# Patient Record
Sex: Female | Born: 1970
Health system: Southern US, Community
[De-identification: ages and names within clinical notes are randomized; demographics above are authoritative.]

## PROBLEM LIST (undated history)

## (undated) DIAGNOSIS — IMO0002 Reserved for concepts with insufficient information to code with codable children: Secondary | ICD-10-CM

## (undated) DIAGNOSIS — G935 Compression of brain: Secondary | ICD-10-CM

## (undated) DIAGNOSIS — T82858A Stenosis of vascular prosthetic devices, implants and grafts, initial encounter: Secondary | ICD-10-CM

## (undated) DIAGNOSIS — R1310 Dysphagia, unspecified: Secondary | ICD-10-CM

## (undated) DIAGNOSIS — I10 Essential (primary) hypertension: Secondary | ICD-10-CM

## (undated) DIAGNOSIS — E669 Obesity, unspecified: Secondary | ICD-10-CM

## (undated) DIAGNOSIS — G932 Benign intracranial hypertension: Secondary | ICD-10-CM

## (undated) DIAGNOSIS — J45909 Unspecified asthma, uncomplicated: Secondary | ICD-10-CM

## (undated) DIAGNOSIS — E739 Lactose intolerance, unspecified: Secondary | ICD-10-CM

## (undated) DIAGNOSIS — N200 Calculus of kidney: Secondary | ICD-10-CM

## (undated) HISTORY — DX: Unspecified asthma, uncomplicated: J45.909

## (undated) HISTORY — DX: Benign intracranial hypertension: G93.2

## (undated) HISTORY — DX: Reserved for concepts with insufficient information to code with codable children: IMO0002

## (undated) HISTORY — DX: Calculus of kidney: N20.0

## (undated) HISTORY — DX: Essential (primary) hypertension: I10

## (undated) HISTORY — DX: Stenosis of other vascular prosthetic devices, implants and grafts, initial encounter: T82.858A

## (undated) HISTORY — DX: Lactose intolerance, unspecified: E73.9

## (undated) HISTORY — DX: Compression of brain: G93.5

## (undated) HISTORY — PX: APPENDECTOMY: SHX54

## (undated) HISTORY — PX: OTHER SURGICAL HISTORY: SHX169

## (undated) HISTORY — PX: GANGLION CYST EXCISION: SHX1691

## (undated) HISTORY — DX: Obesity, unspecified: E66.9

## (undated) HISTORY — DX: Dysphagia, unspecified: R13.10

---

## 1994-09-03 DIAGNOSIS — IMO0002 Reserved for concepts with insufficient information to code with codable children: Secondary | ICD-10-CM

## 1994-09-03 HISTORY — DX: Reserved for concepts with insufficient information to code with codable children: IMO0002

## 1996-07-04 DIAGNOSIS — T82858A Stenosis of vascular prosthetic devices, implants and grafts, initial encounter: Secondary | ICD-10-CM

## 1996-07-04 HISTORY — PX: SUBCLAVIAN BYPASS GRAFT: SHX1059

## 1996-07-04 HISTORY — DX: Stenosis of other vascular prosthetic devices, implants and grafts, initial encounter: T82.858A

## 1998-04-27 ENCOUNTER — Ambulatory Visit (HOSPITAL_COMMUNITY): Admission: RE | Admit: 1998-04-27 | Discharge: 1998-04-27 | Payer: Self-pay | Admitting: Internal Medicine

## 1999-04-10 ENCOUNTER — Ambulatory Visit: Admission: RE | Admit: 1999-04-10 | Discharge: 1999-04-10 | Payer: Self-pay | Admitting: Occupational Medicine

## 1999-04-10 ENCOUNTER — Encounter: Payer: Self-pay | Admitting: Occupational Medicine

## 1999-04-12 ENCOUNTER — Other Ambulatory Visit: Admission: RE | Admit: 1999-04-12 | Discharge: 1999-04-12 | Payer: Self-pay | Admitting: Obstetrics and Gynecology

## 2000-10-09 ENCOUNTER — Other Ambulatory Visit: Admission: RE | Admit: 2000-10-09 | Discharge: 2000-10-09 | Payer: Self-pay | Admitting: Obstetrics and Gynecology

## 2005-02-02 ENCOUNTER — Ambulatory Visit (HOSPITAL_COMMUNITY): Admission: RE | Admit: 2005-02-02 | Discharge: 2005-02-02 | Payer: Self-pay | Admitting: Family Medicine

## 2007-01-31 ENCOUNTER — Inpatient Hospital Stay (HOSPITAL_COMMUNITY): Admission: EM | Admit: 2007-01-31 | Discharge: 2007-02-02 | Payer: Self-pay | Admitting: Emergency Medicine

## 2007-02-02 HISTORY — PX: WRIST FRACTURE SURGERY: SHX121

## 2007-02-04 ENCOUNTER — Ambulatory Visit (HOSPITAL_COMMUNITY): Admission: RE | Admit: 2007-02-04 | Discharge: 2007-02-05 | Payer: Self-pay | Admitting: Orthopaedic Surgery

## 2007-03-12 ENCOUNTER — Encounter: Admission: RE | Admit: 2007-03-12 | Discharge: 2007-06-10 | Payer: Self-pay | Admitting: Orthopaedic Surgery

## 2007-03-25 ENCOUNTER — Ambulatory Visit (HOSPITAL_COMMUNITY): Admission: RE | Admit: 2007-03-25 | Discharge: 2007-03-25 | Payer: Self-pay | Admitting: Orthopaedic Surgery

## 2010-04-02 ENCOUNTER — Emergency Department (HOSPITAL_COMMUNITY): Admission: EM | Admit: 2010-04-02 | Discharge: 2010-04-02 | Payer: Self-pay | Admitting: Family Medicine

## 2010-10-03 ENCOUNTER — Other Ambulatory Visit: Payer: Self-pay | Admitting: Thoracic Surgery

## 2010-10-03 ENCOUNTER — Inpatient Hospital Stay: Admission: RE | Admit: 2010-10-03 | Payer: Self-pay | Source: Ambulatory Visit

## 2010-10-03 DIAGNOSIS — R52 Pain, unspecified: Secondary | ICD-10-CM

## 2010-10-04 ENCOUNTER — Other Ambulatory Visit: Payer: Self-pay | Admitting: Physical Medicine & Rehabilitation

## 2010-11-18 LAB — POCT URINALYSIS DIP (DEVICE)
Bilirubin Urine: NEGATIVE
Ketones, ur: NEGATIVE mg/dL
pH: 5.5 (ref 5.0–8.0)
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
## 2014-04-02 ENCOUNTER — Other Ambulatory Visit (HOSPITAL_COMMUNITY): Payer: Self-pay | Admitting: Interventional Radiology

## 2014-04-02 DIAGNOSIS — F17200 Nicotine dependence, unspecified, uncomplicated: Secondary | ICD-10-CM

## 2015-02-28 ENCOUNTER — Encounter: Payer: Self-pay | Admitting: *Deleted

## 2015-03-01 ENCOUNTER — Ambulatory Visit (INDEPENDENT_AMBULATORY_CARE_PROVIDER_SITE_OTHER): Payer: 59 | Admitting: Diagnostic Neuroimaging

## 2015-03-01 ENCOUNTER — Encounter: Payer: Self-pay | Admitting: Diagnostic Neuroimaging

## 2015-03-01 VITALS — BP 165/84 | HR 76 | Ht 66.0 in | Wt 261.8 lb

## 2015-03-01 DIAGNOSIS — H4710 Unspecified papilledema: Secondary | ICD-10-CM | POA: Diagnosis not present

## 2015-03-01 DIAGNOSIS — H47021 Hemorrhage in optic nerve sheath, right eye: Secondary | ICD-10-CM
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
## 2015-03-01 NOTE — Patient Instructions (Signed)
Filed Vitals:   03/01/15 0814 03/01/15 0916  BP: 161/90 165/84  Pulse: 84 76  Height: 5\' 6"  (1.676 m)   Weight: 261 lb 12.8 oz (118.752 kg)    - monitor BP and follow up with PCP - I will setup MRI scans - I will setup referral to retina specialist

## 2015-03-02 ENCOUNTER — Encounter: Payer: Self-pay | Admitting: Family

## 2015-03-02 ENCOUNTER — Ambulatory Visit (INDEPENDENT_AMBULATORY_CARE_PROVIDER_SITE_OTHER): Payer: 59 | Admitting: Family

## 2015-03-02 VITALS — BP 161/96 | HR 92 | Temp 98.3°F | Ht 66.0 in | Wt 258.2 lb

## 2015-03-02 DIAGNOSIS — I10 Essential (primary) hypertension: Secondary | ICD-10-CM

## 2015-03-02 MED ORDER — LISINOPRIL-HYDROCHLOROTHIAZIDE 20-12.5 MG PO TABS: 1.0000 | ORAL_TABLET | Freq: Every day | ORAL | Status: DC

## 2015-03-02 NOTE — Progress Notes (Signed)
   Subjective:    Patient ID: [name], female    DOB: 21-Mar-1971, 44 y.o.   MRN: [id number]  Pt presents to the office today to establish care. Pt states she saw her Opthmolgists on Monday and he told her she needed to follow up with her PCP about her BP. Pt states she has never had HTN before.  Hypertension This is a new problem. The problem is unchanged. The problem is uncontrolled. Associated symptoms include blurred vision. Pertinent negatives include no anxiety, headaches, palpitations, peripheral edema or shortness of breath. Risk factors for coronary artery disease include family history, obesity and sedentary lifestyle. Past treatments include nothing. The current treatment provides no improvement. There is no history of kidney disease, CAD/MI, CVA, heart failure or a thyroid problem. There is no history of sleep apnea.      Review of Systems  Constitutional: Negative.   HENT: Negative.   Eyes: Positive for blurred vision.  Respiratory: Negative.  Negative for shortness of breath.   Cardiovascular: Negative.  Negative for palpitations.  Gastrointestinal: Negative.   Endocrine: Negative.   Genitourinary: Negative.   Musculoskeletal: Negative.   Neurological: Negative.  Negative for headaches.  Hematological: Negative.   Psychiatric/Behavioral: Negative.   All other systems reviewed and are negative.      Objective:   Physical Exam  Constitutional: She is oriented to person, place, and time. She appears well-developed and well-nourished. No distress.  HENT:  Head: Normocephalic and atraumatic.  Right Ear: External ear normal.  Left Ear: External ear normal.  Nose: Nose normal.  Mouth/Throat: Oropharynx is clear and moist.  Eyes: Pupils are equal, round, and reactive to light.  Neck: Normal range of motion. Neck supple. No thyromegaly present.  Cardiovascular: Normal rate, regular rhythm, normal heart sounds and intact distal pulses.   No murmur  heard. Pulmonary/Chest: Effort normal and breath sounds normal. No respiratory distress. She has no wheezes.  Abdominal: Soft. Bowel sounds are normal. She exhibits no distension. There is no tenderness.  Musculoskeletal: Normal range of motion. She exhibits no edema or tenderness.  Neurological: She is alert and oriented to person, place, and time. She has normal reflexes. No cranial nerve deficit.  Skin: Skin is warm and dry.  Psychiatric: She has a normal mood and affect. Her behavior is normal. Judgment and thought content normal.  Vitals reviewed.   BP 161/96 mmHg  Pulse 92  Temp(Src) 98.3 F (36.8 C) (Oral)  Ht $R'5\' 6"'jT$  (1.676 m)  Wt 258 lb 3.2 oz (117.119 kg)  BMI 41.69 kg/m2       Assessment & Plan:  1. Essential hypertension -Pt started on Zestoretic 20-12.5 mg today -Daily blood pressure log given with instructions on how to fill out and told to bring to next visit -Dash diet information given -Exercise encouraged - Stress Management  -Continue current meds -RTO in 2 weeks - CMP14+EGFR - lisinopril-hydrochlorothiazide (ZESTORETIC) 20-12.5 MG per tablet; Take 1 tablet by mouth daily.  Dispense: 90 tablet; Refill: 3   Continue all meds Labs pending Health Maintenance reviewed Diet and exercise encouraged RTO 2 weeks  [name], FNP

## 2015-03-02 NOTE — Patient Instructions (Addendum)
Health Maintenance Adopting a healthy lifestyle and getting preventive care can go a long way to promote health and wellness. Talk with your health care provider about what schedule of regular examinations is right for you. This is a good chance for you to check in with your provider about disease prevention and staying healthy. In between checkups, there are plenty of things you can do on your own. Experts have done a lot of research about which lifestyle changes and preventive measures are most likely to keep you healthy. Ask your health care provider for more information. WEIGHT AND DIET  Eat a healthy diet  Be sure to include plenty of vegetables, fruits, low-fat dairy products, and lean protein.  Do not eat a lot of foods high in solid fats, added sugars, or salt.  Get regular exercise. This is one of the most important things you can do for your health.  Most adults should exercise for at least 150 minutes each week. The exercise should increase your heart rate and make you sweat (moderate-intensity exercise).  Most adults should also do strengthening exercises at least twice a week. This is in addition to the moderate-intensity exercise.  Maintain a healthy weight  Body mass index (BMI) is a measurement that can be used to identify possible weight problems. It estimates body fat based on height and weight. Your health care provider can help determine your BMI and help you achieve or maintain a healthy weight.  For females 95 years of age and older:   A BMI below 18.5 is considered underweight.  A BMI of 18.5 to 24.9 is normal.  A BMI of 25 to 29.9 is considered overweight.  A BMI of 30 and above is considered obese.  Watch levels of cholesterol and blood lipids  You should start having your blood tested for lipids and cholesterol at 42 years of age, then have this test every 5 years.  You may need to have your cholesterol levels checked more often if:  Your lipid or  cholesterol levels are high.  You are older than 42 years of age.  You are at high risk for heart disease.  CANCER SCREENING   Lung Cancer  Lung cancer screening is recommended for adults 43-18 years old who are at high risk for lung cancer because of a history of smoking.  A yearly low-dose CT scan of the lungs is recommended for people who:  Currently smoke.  Have quit within the past 15 years.  Have at least a 30-pack-year history of smoking. A pack year is smoking an average of one pack of cigarettes a day for 1 year.  Yearly screening should continue until it has been 15 years since you quit.  Yearly screening should stop if you develop a health problem that would prevent you from having lung cancer treatment.  Breast Cancer  Practice breast self-awareness. This means understanding how your breasts normally appear and feel.  It also means doing regular breast self-exams. Let your health care provider know about any changes, no matter how small.  If you are in your 20s or 30s, you should have a clinical breast exam (CBE) by a health care provider every 1-3 years as part of a regular health exam.  If you are 54 or older, have a CBE every year. Also consider having a breast X-ray (mammogram) every year.  If you have a family history of breast cancer, talk to your health care provider about genetic screening.  If you are  at high risk for breast cancer, talk to your health care provider about having an MRI and a mammogram every year.  Breast cancer gene (BRCA) assessment is recommended for women who have family members with BRCA-related cancers. BRCA-related cancers include:  Breast.  Ovarian.  Tubal.  Peritoneal cancers.  Results of the assessment will determine the need for genetic counseling and BRCA1 and BRCA2 testing. Cervical Cancer Routine pelvic examinations to screen for cervical cancer are no longer recommended for nonpregnant women who are considered low  risk for cancer of the pelvic organs (ovaries, uterus, and vagina) and who do not have symptoms. A pelvic examination may be necessary if you have symptoms including those associated with pelvic infections. Ask your health care provider if a screening pelvic exam is right for you.   The Pap test is the screening test for cervical cancer for women who are considered at risk.  If you had a hysterectomy for a problem that was not cancer or a condition that could lead to cancer, then you no longer need Pap tests.  If you are older than 65 years, and you have had normal Pap tests for the past 10 years, you no longer need to have Pap tests.  If you have had past treatment for cervical cancer or a condition that could lead to cancer, you need Pap tests and screening for cancer for at least 20 years after your treatment.  If you no longer get a Pap test, assess your risk factors if they change (such as having a new sexual partner). This can affect whether you should start being screened again.  Some women have medical problems that increase their chance of getting cervical cancer. If this is the case for you, your health care provider may recommend more frequent screening and Pap tests.  The human papillomavirus (HPV) test is another test that may be used for cervical cancer screening. The HPV test looks for the virus that can cause cell changes in the cervix. The cells collected during the Pap test can be tested for HPV.  The HPV test can be used to screen women 30 years of age and older. Getting tested for HPV can extend the interval between normal Pap tests from three to five years.  An HPV test also should be used to screen women of any age who have unclear Pap test results.  After 42 years of age, women should have HPV testing as often as Pap tests.  Colorectal Cancer  This type of cancer can be detected and often prevented.  Routine colorectal cancer screening usually begins at 42 years of  age and continues through 42 years of age.  Your health care provider may recommend screening at an earlier age if you have risk factors for colon cancer.  Your health care provider may also recommend using home test kits to check for hidden blood in the stool.  A small camera at the end of a tube can be used to examine your colon directly (sigmoidoscopy or colonoscopy). This is done to check for the earliest forms of colorectal cancer.  Routine screening usually begins at age 50.  Direct examination of the colon should be repeated every 5-10 years through 42 years of age. However, you may need to be screened more often if early forms of precancerous polyps or small growths are found. Skin Cancer  Check your skin from head to toe regularly.  Tell your health care provider about any new moles or changes in   moles, especially if there is a change in a mole's shape or color.  Also tell your health care provider if you have a mole that is larger than the size of a pencil eraser.  Always use sunscreen. Apply sunscreen liberally and repeatedly throughout the day.  Protect yourself by wearing long sleeves, pants, a wide-brimmed hat, and sunglasses whenever you are outside. HEART DISEASE, DIABETES, AND HIGH BLOOD PRESSURE   Have your blood pressure checked at least every 1-2 years. High blood pressure causes heart disease and increases the risk of stroke.  If you are between 42 years and 69 years old, ask your health care provider if you should take aspirin to prevent strokes.  Have regular diabetes screenings. This involves taking a blood sample to check your fasting blood sugar level.  If you are at a normal weight and have a low risk for diabetes, have this test once every three years after 42 years of age.  If you are overweight and have a high risk for diabetes, consider being tested at a younger age or more often. PREVENTING INFECTION  Hepatitis B  If you have a higher risk for  hepatitis B, you should be screened for this virus. You are considered at high risk for hepatitis B if:  You were born in a country where hepatitis B is common. Ask your health care provider which countries are considered high risk.  Your parents were born in a high-risk country, and you have not been immunized against hepatitis B (hepatitis B vaccine).  You have HIV or AIDS.  You use needles to inject street drugs.  You live with someone who has hepatitis B.  You have had sex with someone who has hepatitis B.  You get hemodialysis treatment.  You take certain medicines for conditions, including cancer, organ transplantation, and autoimmune conditions. Hepatitis C  Blood testing is recommended for:  Everyone born from [birth year] through 1963.  Anyone with known risk factors for hepatitis C. Sexually transmitted infections (STIs)  You should be screened for sexually transmitted infections (STIs) including gonorrhea and chlamydia if:  You are sexually active and are younger than 42 years of age.  You are older than 43 years of age and your health care provider tells you that you are at risk for this type of infection.  Your sexual activity has changed since you were last screened and you are at an increased risk for chlamydia or gonorrhea. Ask your health care provider if you are at risk.  If you do not have HIV, but are at risk, it may be recommended that you take a prescription medicine daily to prevent HIV infection. This is called pre-exposure prophylaxis (PrEP). You are considered at risk if:  You are sexually active and do not regularly use condoms or know the HIV status of your partner(s).  You take drugs by injection.  You are sexually active with a partner who has HIV. Talk with your health care provider about whether you are at high risk of being infected with HIV. If you choose to begin PrEP, you should first be tested for HIV. You should then be tested every 3 months for  as long as you are taking PrEP.  PREGNANCY   If you are premenopausal and you may become pregnant, ask your health care provider about preconception counseling.  If you may become pregnant, take 400 to 800 micrograms (mcg) of folic acid every day.  If you want to prevent pregnancy, talk to your  health care provider about birth control (contraception). OSTEOPOROSIS AND MENOPAUSE   Osteoporosis is a disease in which the bones lose minerals and strength with aging. This can result in serious bone fractures. Your risk for osteoporosis can be identified using a bone density scan.  If you are 38 years of age or older, or if you are at risk for osteoporosis and fractures, ask your health care provider if you should be screened.  Ask your health care provider whether you should take a calcium or vitamin D supplement to lower your risk for osteoporosis.  Menopause may have certain physical symptoms and risks.  Hormone replacement therapy may reduce some of these symptoms and risks. Talk to your health care provider about whether hormone replacement therapy is right for you.  HOME CARE INSTRUCTIONS   Schedule regular health, dental, and eye exams.  Stay current with your immunizations.   Do not use any tobacco products including cigarettes, chewing tobacco, or electronic cigarettes.  If you are pregnant, do not drink alcohol.  If you are breastfeeding, limit how much and how often you drink alcohol.  Limit alcohol intake to no more than 1 drink per day for nonpregnant women. One drink equals 12 ounces of beer, 5 ounces of wine, or 1 ounces of hard liquor.  Do not use street drugs.  Do not share needles.  Ask your health care provider for help if you need support or information about quitting drugs.  Tell your health care provider if you often feel depressed.  Tell your health care provider if you have ever been abused or do not feel safe at home. Document Released: 03/05/2011  Document Revised: 01/04/2014 Document Reviewed: 07/22/2013 [facility] Patient Information 2015 [place], [place]. This information is not intended to replace advice given to you by your health care provider. Make sure you discuss any questions you have with your health care provider. DASH Eating Plan DASH stands for "Dietary Approaches to Stop Hypertension." The DASH eating plan is a healthy eating plan that has been shown to reduce high blood pressure (hypertension). Additional health benefits may include reducing the risk of type 2 diabetes mellitus, heart disease, and stroke. The DASH eating plan may also help with weight loss. WHAT DO I NEED TO KNOW ABOUT THE DASH EATING PLAN? For the DASH eating plan, you will follow these general guidelines:  Choose foods with a percent daily value for sodium of less than 5% (as listed on the food label).  Use salt-free seasonings or herbs instead of table salt or sea salt.  Check with your health care provider or pharmacist before using salt substitutes.  Eat lower-sodium products, often labeled as "lower sodium" or "no salt added."  Eat fresh foods.  Eat more vegetables, fruits, and low-fat dairy products.  Choose whole grains. Look for the word "whole" as the first word in the ingredient list.  Choose fish and skinless chicken or [place] more often than red meat. Limit fish, poultry, and meat to 6 oz (170 g) each day.  Limit sweets, desserts, sugars, and sugary drinks.  Choose heart-healthy fats.  Limit cheese to 1 oz (28 g) per day.  Eat more home-cooked food and less restaurant, buffet, and fast food.  Limit fried foods.  Cook foods using methods other than frying.  Limit canned vegetables. If you do use them, rinse them well to decrease the sodium.  When eating at a restaurant, ask that your food be prepared with less salt, or no salt if possible.  WHAT FOODS CAN I EAT? Seek help from a dietitian for individual calorie  needs. Grains Whole grain or whole wheat bread. Brown rice. Whole grain or whole wheat pasta. Quinoa, bulgur, and whole grain cereals. Low-sodium cereals. Corn or whole wheat flour tortillas. Whole grain cornbread. Whole grain crackers. Low-sodium crackers. Vegetables Fresh or frozen vegetables (raw, steamed, roasted, or grilled). Low-sodium or reduced-sodium tomato and vegetable juices. Low-sodium or reduced-sodium tomato sauce and paste. Low-sodium or reduced-sodium canned vegetables.  Fruits All fresh, canned (in natural juice), or frozen fruits. Meat and Other Protein Products Ground beef (85% or leaner), grass-fed beef, or beef trimmed of fat. Skinless chicken or [place]. Ground chicken or [place]. Pork trimmed of fat. All fish and seafood. Eggs. Dried beans, peas, or lentils. Unsalted nuts and seeds. Unsalted canned beans. Dairy Low-fat dairy products, such as skim or 1% milk, 2% or reduced-fat cheeses, low-fat ricotta or cottage cheese, or plain low-fat yogurt. Low-sodium or reduced-sodium cheeses. Fats and Oils Tub margarines without trans fats. Light or reduced-fat mayonnaise and salad dressings (reduced sodium). Avocado. Safflower, olive, or canola oils. Natural peanut or almond butter. Other Unsalted popcorn and pretzels. The items listed above may not be a complete list of recommended foods or beverages. Contact your dietitian for more options. WHAT FOODS ARE NOT RECOMMENDED? Grains Dokes bread. Bodenheimer pasta. Neyer rice. Refined cornbread. Bagels and croissants. Crackers that contain trans fat. Vegetables Creamed or fried vegetables. Vegetables in a cheese sauce. Regular canned vegetables. Regular canned tomato sauce and paste. Regular tomato and vegetable juices. Fruits Dried fruits. Canned fruit in light or heavy syrup. Fruit juice. Meat and Other Protein Products Fatty cuts of meat. Ribs, chicken wings, bacon, sausage, bologna, salami, chitterlings, fatback, hot dogs, bratwurst,  and packaged luncheon meats. Salted nuts and seeds. Canned beans with salt. Dairy Whole or 2% milk, cream, half-and-half, and cream cheese. Whole-fat or sweetened yogurt. Full-fat cheeses or blue cheese. Nondairy creamers and whipped toppings. Processed cheese, cheese spreads, or cheese curds. Condiments Onion and garlic salt, seasoned salt, table salt, and sea salt. Canned and packaged gravies. Worcestershire sauce. Tartar sauce. Barbecue sauce. Teriyaki sauce. Soy sauce, including reduced sodium. Steak sauce. Fish sauce. Oyster sauce. Cocktail sauce. Horseradish. Ketchup and mustard. Meat flavorings and tenderizers. Bouillon cubes. Hot sauce. Tabasco sauce. Marinades. Taco seasonings. Relishes. Fats and Oils Butter, stick margarine, lard, shortening, ghee, and bacon fat. Coconut, palm kernel, or palm oils. Regular salad dressings. Other Pickles and olives. Salted popcorn and pretzels. The items listed above may not be a complete list of foods and beverages to avoid. Contact your dietitian for more information. WHERE CAN I FIND MORE INFORMATION? National Heart, Lung, and Blood Institute: travelstabloid.com Document Released: 08/09/2011 Document Revised: 01/04/2014 Document Reviewed: 06/24/2013 [facility] Patient Information 2015 [place], [place]. This information is not intended to replace advice given to you by your health care provider. Make sure you discuss any questions you have with your health care provider. Hypertension Hypertension, commonly called high blood pressure, is when the force of blood pumping through your arteries is too strong. Your arteries are the blood vessels that carry blood from your heart throughout your body. A blood pressure reading consists of a higher number over a lower number, such as 110/72. The higher number (systolic) is the pressure inside your arteries when your heart pumps. The lower number (diastolic) is the pressure inside your  arteries when your heart relaxes. Ideally you want your blood pressure below 120/80. Hypertension forces your heart to work harder  to pump blood. Your arteries may become narrow or stiff. Having hypertension puts you at risk for heart disease, stroke, and other problems.  RISK FACTORS Some risk factors for high blood pressure are controllable. Others are not.  Risk factors you cannot control include:   Race. You may be at higher risk if you are African American.  Age. Risk increases with age.  Gender. Men are at higher risk than women before age 71 years. After age 23, women are at higher risk than men. Risk factors you can control include:  Not getting enough exercise or physical activity.  Being overweight.  Getting too much fat, sugar, calories, or salt in your diet.  Drinking too much alcohol. SIGNS AND SYMPTOMS Hypertension does not usually cause signs or symptoms. Extremely high blood pressure (hypertensive crisis) may cause headache, anxiety, shortness of breath, and nosebleed. DIAGNOSIS  To check if you have hypertension, your health care provider will measure your blood pressure while you are seated, with your arm held at the level of your heart. It should be measured at least twice using the same arm. Certain conditions can cause a difference in blood pressure between your right and left arms. A blood pressure reading that is higher than normal on one occasion does not mean that you need treatment. If one blood pressure reading is high, ask your health care provider about having it checked again. TREATMENT  Treating high blood pressure includes making lifestyle changes and possibly taking medicine. Living a healthy lifestyle can help lower high blood pressure. You may need to change some of your habits. Lifestyle changes may include:  Following the DASH diet. This diet is high in fruits, vegetables, and whole grains. It is low in salt, red meat, and added sugars.  Getting at  least 2 hours of brisk physical activity every week.  Losing weight if necessary.  Not smoking.  Limiting alcoholic beverages.  Learning ways to reduce stress. If lifestyle changes are not enough to get your blood pressure under control, your health care provider may prescribe medicine. You may need to take more than one. Work closely with your health care provider to understand the risks and benefits. HOME CARE INSTRUCTIONS  Have your blood pressure rechecked as directed by your health care provider.   Take medicines only as directed by your health care provider. Follow the directions carefully. Blood pressure medicines must be taken as prescribed. The medicine does not work as well when you skip doses. Skipping doses also puts you at risk for problems.   Do not smoke.   Monitor your blood pressure at home as directed by your health care provider. SEEK MEDICAL CARE IF:   You think you are having a reaction to medicines taken.  You have recurrent headaches or feel dizzy.  You have swelling in your ankles.  You have trouble with your vision. SEEK IMMEDIATE MEDICAL CARE IF:  You develop a severe headache or confusion.  You have unusual weakness, numbness, or feel faint.  You have severe chest or abdominal pain.  You vomit repeatedly.  You have trouble breathing. MAKE SURE YOU:   Understand these instructions.  Will watch your condition.  Will get help right away if you are not doing well or get worse. Document Released: 08/20/2005 Document Revised: 01/04/2014 Document Reviewed: 06/12/2013 [facility] Patient Information 2015 [place], [place]. This information is not intended to replace advice given to you by your health care provider. Make sure you discuss any questions you have with  your health care provider.

## 2015-03-03 LAB — CMP14+EGFR
ALBUMIN: 4.5 g/dL (ref 3.5–5.5)
ALT: 32 IU/L (ref 0–32)
AST: 18 IU/L (ref 0–40)
Albumin/Globulin Ratio: 1.8 (ref 1.1–2.5)
Alkaline Phosphatase: 45 IU/L (ref 39–117)
BUN/Creatinine Ratio: 25 — ABNORMAL HIGH (ref 9–23)
BUN: 21 mg/dL (ref 6–24)
Bilirubin Total: 0.5 mg/dL (ref 0.0–1.2)
CALCIUM: 9.6 mg/dL (ref 8.7–10.2)
CHLORIDE: 100 mmol/L (ref 97–108)
CO2: 21 mmol/L (ref 18–29)
CREATININE: 0.83 mg/dL (ref 0.57–1.00)
GFR calc Af Amer: 101 mL/min/{1.73_m2} (ref >59)
GFR calc non Af Amer: 88 mL/min/{1.73_m2} (ref >59)
GLUCOSE: 85 mg/dL (ref 65–99)
Globulin, Total: 2.5 g/dL (ref 1.5–4.5)
Potassium: 4.3 mmol/L (ref 3.5–5.2)
SODIUM: 139 mmol/L (ref 134–144)
Total Protein: 7 g/dL (ref 6.0–8.5)

## 2015-03-15 ENCOUNTER — Encounter: Payer: Self-pay | Admitting: Physician Assistant

## 2015-03-15 ENCOUNTER — Ambulatory Visit (INDEPENDENT_AMBULATORY_CARE_PROVIDER_SITE_OTHER): Payer: 59 | Admitting: Physician Assistant

## 2015-03-15 VITALS — BP 139/85 | HR 75 | Temp 97.7°F | Ht 66.0 in | Wt 258.0 lb

## 2015-03-15 DIAGNOSIS — Z0000 Encounter for general adult medical examination without abnormal findings: Secondary | ICD-10-CM | POA: Diagnosis not present

## 2015-03-15 DIAGNOSIS — I10 Essential (primary) hypertension: Secondary | ICD-10-CM

## 2015-03-15 NOTE — Progress Notes (Signed)
   Subjective:    Patient ID: [name], female    DOB: [birth year], 44 y.o.   MRN: [id number]  HPI 44 y/o female presents for 2 week follow up of newly diagnosed HTN. She was started on Lisinopril $RemoveBefor'20mg'NedqDibizfZR$  , which appears to be working well according to patients recordings at home and today's vitals.   She thought that she was having eye problems d/t her bp, however, she went to the opthalmologist and was told to have optic nerve enlargement. She is scheduled to have an MRI tomorrow.     Review of Systems  Constitutional: Negative.   HENT: Negative.   Eyes: Positive for visual disturbance.  Respiratory: Negative.   Cardiovascular: Negative.   Gastrointestinal: Negative.   Endocrine: Positive for polyuria.  Genitourinary: Negative.   Musculoskeletal: Negative.   Skin: Negative.   Allergic/Immunologic: Negative.   Neurological: Negative.   Hematological: Negative.   Psychiatric/Behavioral: Negative.        Objective:   Physical Exam  Constitutional: She is oriented to person, place, and time. She appears well-developed and well-nourished. No distress.  HENT:  Head: Normocephalic.  Cardiovascular: Normal rate, regular rhythm, normal heart sounds and intact distal pulses.  Exam reveals no gallop and no friction rub.   No murmur heard. Pulmonary/Chest: Effort normal and breath sounds normal. No respiratory distress. She has no wheezes. She has no rales. She exhibits no tenderness.  Musculoskeletal: She exhibits no edema.  Neurological: She is alert and oriented to person, place, and time.  Skin: She is not diaphoretic.  Psychiatric: She has a normal mood and affect. Her behavior is normal. Judgment and thought content normal.  Nursing note and vitals reviewed.         Assessment & Plan:  1. Benign essential HTN  - Continue Lisinopril as directed. Follow up in 3 months  2. Essential hypertension  - CMP14+EGFR - Thyroid Panel With TSH - Lipid panel  3.  Preventative health care  - Thyroid Panel With TSH   Also discussed screening mammogram with patient. She has not had one since turning age 75. She agrees to have one but would like to wait until she has her other testing with her optic eval and MRI  Continue all meds Labs pending Health Maintenance reviewed Diet and exercise encouraged RTO 3 months for recheck of HTN  Keondria Siever A. Benjamin Stain PA-C

## 2015-03-16 ENCOUNTER — Other Ambulatory Visit: Payer: 59

## 2015-03-16 ENCOUNTER — Ambulatory Visit (INDEPENDENT_AMBULATORY_CARE_PROVIDER_SITE_OTHER): Payer: 59

## 2015-03-16 DIAGNOSIS — H47021 Hemorrhage in optic nerve sheath, right eye: Secondary | ICD-10-CM | POA: Diagnosis not present

## 2015-03-16 DIAGNOSIS — H4710 Unspecified papilledema: Secondary | ICD-10-CM

## 2015-03-16 LAB — LIPID PANEL
CHOLESTEROL TOTAL: 188 mg/dL (ref 100–199)
Chol/HDL Ratio: 3.3 ratio units (ref 0.0–4.4)
HDL: 57 mg/dL (ref >39)
LDL Calculated: 109 mg/dL — ABNORMAL HIGH (ref 0–99)
Triglycerides: 111 mg/dL (ref 0–149)
VLDL CHOLESTEROL CAL: 22 mg/dL (ref 5–40)

## 2015-03-16 LAB — THYROID PANEL WITH TSH
Free Thyroxine Index: 2 (ref 1.2–4.9)
T3 Uptake Ratio: 26 % (ref 24–39)
T4 TOTAL: 7.8 ug/dL (ref 4.5–12.0)
TSH: 1.88 u[IU]/mL (ref 0.450–4.500)

## 2015-03-16 LAB — CMP14+EGFR
A/G RATIO: 1.8 (ref 1.1–2.5)
ALT: 24 IU/L (ref 0–32)
AST: 17 IU/L (ref 0–40)
Albumin: 4.4 g/dL (ref 3.5–5.5)
Alkaline Phosphatase: 43 IU/L (ref 39–117)
BUN / CREAT RATIO: 18 (ref 9–23)
BUN: 15 mg/dL (ref 6–24)
Bilirubin Total: 0.4 mg/dL (ref 0.0–1.2)
CALCIUM: 9.3 mg/dL (ref 8.7–10.2)
CHLORIDE: 98 mmol/L (ref 97–108)
CO2: 20 mmol/L (ref 18–29)
CREATININE: 0.82 mg/dL (ref 0.57–1.00)
GFR calc Af Amer: 103 mL/min/{1.73_m2} (ref >59)
GFR, EST NON AFRICAN AMERICAN: 89 mL/min/{1.73_m2} (ref >59)
Globulin, Total: 2.4 g/dL (ref 1.5–4.5)
Glucose: 84 mg/dL (ref 65–99)
POTASSIUM: 4.4 mmol/L (ref 3.5–5.2)
Sodium: 136 mmol/L (ref 134–144)
Total Protein: 6.8 g/dL (ref 6.0–8.5)

## 2015-03-17 MED ORDER — GADOPENTETATE DIMEGLUMINE 469.01 MG/ML IV SOLN: 20.0000 mL | Freq: Once | INTRAVENOUS | Status: AC | PRN

## 2015-03-24 ENCOUNTER — Encounter: Payer: Self-pay | Admitting: *Deleted

## 2015-04-12 ENCOUNTER — Other Ambulatory Visit: Payer: Self-pay | Admitting: *Deleted

## 2015-04-14 ENCOUNTER — Other Ambulatory Visit: Payer: Self-pay

## 2015-04-19 ENCOUNTER — Other Ambulatory Visit: Payer: Self-pay

## 2015-04-19 DIAGNOSIS — I10 Essential (primary) hypertension: Secondary | ICD-10-CM

## 2015-04-19 MED ORDER — LISINOPRIL-HYDROCHLOROTHIAZIDE 20-12.5 MG PO TABS: 1.0000 | ORAL_TABLET | Freq: Every day | ORAL | Status: DC

## 2015-04-20 ENCOUNTER — Ambulatory Visit (INDEPENDENT_AMBULATORY_CARE_PROVIDER_SITE_OTHER): Payer: 59 | Admitting: Diagnostic Neuroimaging

## 2015-04-20 ENCOUNTER — Encounter: Payer: Self-pay | Admitting: Diagnostic Neuroimaging

## 2015-04-20 ENCOUNTER — Telehealth: Payer: Self-pay | Admitting: *Deleted

## 2015-04-20 VITALS — BP 135/82 | HR 71 | Wt 253.0 lb

## 2015-04-20 DIAGNOSIS — H47021 Hemorrhage in optic nerve sheath, right eye: Secondary | ICD-10-CM | POA: Diagnosis not present

## 2015-04-20 DIAGNOSIS — H4710 Unspecified papilledema: Secondary | ICD-10-CM

## 2015-04-20 NOTE — Patient Instructions (Signed)
-   follow up referral to retinal specialist - setup lumbar puncture to measure opening pressure - then may consider acetazolamide or topiramate
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
## 2015-04-20 NOTE — Telephone Encounter (Signed)
Form,[employer] sent to [facility] and Dr Leta Baptist 04/20/15.

## 2015-04-21 ENCOUNTER — Telehealth: Payer: Self-pay | Admitting: *Deleted

## 2015-04-21 DIAGNOSIS — Z0289 Encounter for other administrative examinations: Secondary | ICD-10-CM

## 2015-04-21 NOTE — Telephone Encounter (Signed)
Form completed and sent to MR.

## 2015-04-21 NOTE — Telephone Encounter (Signed)
Spoke with Baker Janus at [place] and advised her of Dr Gladstone Lighter order for fluoro guided LP for this patient. She stated she would "take care of it" and call patient.

## 2015-04-22 ENCOUNTER — Telehealth: Payer: Self-pay | Admitting: *Deleted

## 2015-04-22 NOTE — Telephone Encounter (Signed)
Form,[employer] received,completed by Dr Leta Baptist and Stanton Kidney at front desk for patient 04/22/15.

## 2015-04-28 ENCOUNTER — Telehealth: Payer: Self-pay | Admitting: *Deleted

## 2015-04-28 NOTE — Telephone Encounter (Signed)
Form,[employer] sent to [facility] and Dr Leta Baptist 04/28/15.

## 2015-04-29 ENCOUNTER — Other Ambulatory Visit: Payer: Self-pay | Admitting: Diagnostic Neuroimaging

## 2015-04-29 ENCOUNTER — Ambulatory Visit
Admission: RE | Admit: 2015-04-29 | Discharge: 2015-04-29 | Disposition: A | Discharge status: Home / Self Care | Payer: 59 | Source: Ambulatory Visit | Attending: Diagnostic Neuroimaging | Admitting: Diagnostic Neuroimaging

## 2015-04-29 DIAGNOSIS — H4710 Unspecified papilledema: Secondary | ICD-10-CM

## 2015-04-29 DIAGNOSIS — H47021 Hemorrhage in optic nerve sheath, right eye: Secondary | ICD-10-CM

## 2015-04-29 LAB — CSF CELL COUNT WITH DIFFERENTIAL
RBC COUNT CSF: 0 uL
TUBE #: 3
WBC CSF: 0 uL (ref 0–5)

## 2015-04-29 LAB — PROTEIN, CSF: Total Protein, CSF: 28 mg/dL (ref 15–45)

## 2015-04-29 LAB — GLUCOSE, CSF: Glucose, CSF: 54 mg/dL (ref 43–76)

## 2015-04-29 NOTE — Discharge Instructions (Signed)

## 2015-05-01 ENCOUNTER — Telehealth: Payer: Self-pay | Admitting: Neurology

## 2015-05-01 NOTE — Telephone Encounter (Signed)
The patient called yesterday. She is having some symptoms of nausea, dizziness and slight headache with sitting and standing following an LP this week. I have asked her to keep her head down for the next 2 days, call if the symptoms persist, and we will get a blood patch set up.

## 2015-05-02 ENCOUNTER — Telehealth: Payer: Self-pay | Admitting: Diagnostic Neuroimaging

## 2015-05-02 LAB — CSF CULTURE
GRAM STAIN: NONE SEEN
GRAM STAIN: NONE SEEN

## 2015-05-02 LAB — CSF CULTURE W GRAM STAIN: Organism ID, Bacteria: NO GROWTH

## 2015-05-02 NOTE — Telephone Encounter (Signed)
Patient is calling and states that she is still having headaches and nausea as a result of her lumbar puncture.  Please call.

## 2015-05-02 NOTE — Telephone Encounter (Signed)
Spoke with patient who states if she is lying down or reclining she "is fine", but when she gets up she states HA and nausea return. She states she has taken Tylenol with little relief, states "I can tolerate HA but not the nausea".  She has not taken any other medications for her issues. Her LP was 04/29/15. Informed her would route her c/o to Dr Leta Baptist and call her back with his reply. She verbalized understanding, appreciation.

## 2015-05-02 NOTE — Telephone Encounter (Signed)
Spoke with patient and informed her of Dr [employer] recommendations.  She agrees to try his recommendations. She is requesting Zofran and Fioricet be e scribed to [facility] today. Informed her would send request to Dr Leta Baptist, and advised she call pharmacy before going to pick up.  She verbalized understanding, appreciation.  Also obtained information to complete her LOA forms. She states she is hoping medications will ease her nausea so she can return to work on 05/04/15 without restrictions. Papers on Dr [employer] desk for signature.

## 2015-05-02 NOTE — Telephone Encounter (Signed)
Encourage caffeine, water, rest. Can call in fioricet or phenergan or zofran. If sxs continue more than 7 days, then will consider blood patch. -VRP

## 2015-05-03 ENCOUNTER — Telehealth: Payer: Self-pay | Admitting: *Deleted

## 2015-05-03 MED ORDER — ONDANSETRON 4 MG PO TBDP: 4.0000 mg | ORAL_TABLET | Freq: Three times a day (TID) | ORAL | Status: DC | PRN

## 2015-05-03 MED ORDER — BUTALBITAL-APAP-CAFFEINE 50-325-40 MG PO TABS
1.0000 | ORAL_TABLET | Freq: Three times a day (TID) | ORAL | Status: DC | PRN
Start: 2015-05-03 — End: 2015-06-23

## 2015-05-03 NOTE — Telephone Encounter (Signed)
Will send in fioricet and ondansetron. -VRP

## 2015-05-03 NOTE — Telephone Encounter (Signed)
Form,[employer] received, completed by Dr Leta Baptist and Stanton Kidney she faxed form 05/03/15.

## 2015-05-03 NOTE — Telephone Encounter (Signed)
FMLA/ leave papers complete and successfully faxed to [facility] at patient's request. Sent to MR to be scanned.

## 2015-05-04 ENCOUNTER — Encounter: Payer: Self-pay | Admitting: *Deleted

## 2015-05-04 ENCOUNTER — Telehealth: Payer: Self-pay | Admitting: Diagnostic Neuroimaging

## 2015-05-04 ENCOUNTER — Emergency Department (HOSPITAL_COMMUNITY): Payer: 59

## 2015-05-04 ENCOUNTER — Emergency Department (HOSPITAL_COMMUNITY)
Admission: EM | Admit: 2015-05-04 | Discharge: 2015-05-04 | Disposition: A | Discharge status: Home / Self Care | Payer: 59 | Attending: Emergency Medicine | Admitting: Emergency Medicine

## 2015-05-04 ENCOUNTER — Encounter (HOSPITAL_COMMUNITY): Payer: Self-pay | Admitting: Emergency Medicine

## 2015-05-04 DIAGNOSIS — J9801 Acute bronchospasm: Secondary | ICD-10-CM | POA: Diagnosis not present

## 2015-05-04 DIAGNOSIS — Z9889 Other specified postprocedural states: Secondary | ICD-10-CM | POA: Insufficient documentation

## 2015-05-04 DIAGNOSIS — Z79899 Other long term (current) drug therapy: Secondary | ICD-10-CM | POA: Diagnosis not present

## 2015-05-04 DIAGNOSIS — G971 Other reaction to spinal and lumbar puncture: Secondary | ICD-10-CM | POA: Insufficient documentation

## 2015-05-04 DIAGNOSIS — Q0700 Arnold-Chiari syndrome without spina bifida or hydrocephalus: Secondary | ICD-10-CM | POA: Diagnosis not present

## 2015-05-04 DIAGNOSIS — R0602 Shortness of breath: Secondary | ICD-10-CM | POA: Diagnosis present

## 2015-05-04 LAB — BASIC METABOLIC PANEL
ANION GAP: 10 (ref 5–15)
BUN: 15 mg/dL (ref 6–20)
CALCIUM: 9.3 mg/dL (ref 8.9–10.3)
CHLORIDE: 103 mmol/L (ref 101–111)
CO2: 22 mmol/L (ref 22–32)
Creatinine, Ser: 1 mg/dL (ref 0.44–1.00)
GFR calc Af Amer: >60 mL/min (ref >60)
GFR calc non Af Amer: >60 mL/min (ref >60)
GLUCOSE: 106 mg/dL — AB (ref 65–99)
Potassium: 3.2 mmol/L — ABNORMAL LOW (ref 3.5–5.1)
Sodium: 135 mmol/L (ref 135–145)

## 2015-05-04 LAB — CBC WITH DIFFERENTIAL/PLATELET
BASOS ABS: 0 10*3/uL (ref 0.0–0.1)
Basophils Relative: 1 % (ref 0–1)
Eosinophils Absolute: 0.5 10*3/uL (ref 0.0–0.7)
Eosinophils Relative: 6 % — ABNORMAL HIGH (ref 0–5)
HEMATOCRIT: 39.7 % (ref 36.0–46.0)
HEMOGLOBIN: 13.6 g/dL (ref 12.0–15.0)
LYMPHS PCT: 23 % (ref 12–46)
Lymphs Abs: 1.9 10*3/uL (ref 0.7–4.0)
MCH: 30.8 pg (ref 26.0–34.0)
MCHC: 34.3 g/dL (ref 30.0–36.0)
MCV: 89.8 fL (ref 78.0–100.0)
MONO ABS: 0.6 10*3/uL (ref 0.1–1.0)
MONOS PCT: 7 % (ref 3–12)
NEUTROS ABS: 5.4 10*3/uL (ref 1.7–7.7)
NEUTROS PCT: 63 % (ref 43–77)
Platelets: 238 10*3/uL (ref 150–400)
RBC: 4.42 MIL/uL (ref 3.87–5.11)
RDW: 13.7 % (ref 11.5–15.5)
WBC: 8.4 10*3/uL (ref 4.0–10.5)

## 2015-05-04 LAB — TROPONIN I: Troponin I: <0.03 ng/mL (ref <0.031)

## 2015-05-04 MED ORDER — METOCLOPRAMIDE HCL 5 MG/ML IJ SOLN
10.0000 mg | Freq: Once | INTRAMUSCULAR | Status: AC
  Administered 2015-05-04: 10 mg via INTRAVENOUS
  Filled 2015-05-04: qty 2

## 2015-05-04 MED ORDER — MORPHINE SULFATE (PF) 4 MG/ML IV SOLN
4.0000 mg | Freq: Once | INTRAVENOUS | Status: AC
  Administered 2015-05-04: 4 mg via INTRAVENOUS
  Filled 2015-05-04: qty 1

## 2015-05-04 MED ORDER — SODIUM CHLORIDE 0.9 % IV BOLUS (SEPSIS)
2000.0000 mL | Freq: Once | INTRAVENOUS | Status: AC
  Administered 2015-05-04: 2000 mL via INTRAVENOUS

## 2015-05-04 MED ORDER — ALBUTEROL SULFATE (2.5 MG/3ML) 0.083% IN NEBU
5.0000 mg | INHALATION_SOLUTION | Freq: Once | RESPIRATORY_TRACT | Status: AC
  Administered 2015-05-04: 5 mg via RESPIRATORY_TRACT
  Filled 2015-05-04: qty 6

## 2015-05-04 MED ORDER — ALBUTEROL SULFATE HFA 108 (90 BASE) MCG/ACT IN AERS: 2.0000 | INHALATION_SPRAY | RESPIRATORY_TRACT | Status: DC | PRN

## 2015-05-04 MED ORDER — ALBUTEROL SULFATE HFA 108 (90 BASE) MCG/ACT IN AERS
2.0000 | INHALATION_SPRAY | RESPIRATORY_TRACT | Status: DC | PRN
  Administered 2015-05-04: 2 via RESPIRATORY_TRACT
  Filled 2015-05-04: qty 6.7

## 2015-05-04 MED ORDER — IPRATROPIUM BROMIDE 0.02 % IN SOLN
0.5000 mg | Freq: Once | RESPIRATORY_TRACT | Status: AC
  Administered 2015-05-04: 0.5 mg via RESPIRATORY_TRACT
  Filled 2015-05-04: qty 2.5

## 2015-05-04 NOTE — Telephone Encounter (Signed)
Left vm for patient informing her that LP showed elevated pressure which is consistent with her pseudotumor cerebri.

## 2015-05-04 NOTE — Telephone Encounter (Signed)
I called patient. No answer.  She just went to ER This AM for SOB. LP results reviewed. Patient has elevated pressure in CSF. No infx or inflammation. Will recommend acetazolamide therapy. Will try calling later. -VRP

## 2015-05-04 NOTE — Telephone Encounter (Signed)
-----   Message from Penni Bombard, MD sent at 05/04/2015  8:20 AM EDT ----- Elevated pressure noted. Consistent with pseudotumor cerebri.

## 2015-05-04 NOTE — ED Provider Notes (Signed)
CSN: [id number]   Arrival date & time 05/04/15 0235  History  This chart was scribed for Jola Schmidt, MD by Altamease Oiler, ED Scribe. This patient was seen in room WA15/WA15 and the patient's care was started at 2:48 AM.  Chief Complaint  Patient presents with  . Shortness of Breath    HPI The history is provided by the patient. No language interpreter was used.   [name] is a 44 y.o. female who presents to the Emergency Department complaining of increasing SOB with onset 1 day ago.  Pt had a LP on 8/26 for evaluation of intercranial HTN. Her husband states that she has been audibly wheezing since the procedure. Associated symptoms include dry cough. Pt has had difficulty breathing in the past but has not used albuterol for years. Since the LP she has had a severe headache. She called her doctor and was told to lay flat and drink more fluids for the headache which has provided no relief. Additionally the Fioricet that she was prescribed has been ineffective. No fever, LE swelling, or abdominal pain. No personal or family history of DVT/PE.   Past Medical History  Diagnosis Date  . Chiari malformation 1996    decompression   . Subclavian bypass stenosis 11/97    right    Past Surgical History  Procedure Laterality Date  . Subclavian bypass graft Right 07/1996  . Ganglion cyst excision Right     wrist  . Wrist fracture surgery Right 02/2007    Family History  Problem Relation Age of Onset  . Cataracts Mother   . Diabetes Mother   . Hypertension Mother   . Diabetes Father   . Hypertension Father     Social History  Substance Use Topics  . Smoking status: Never Smoker   . Smokeless tobacco: None  . Alcohol Use: No     Review of Systems 10 Systems reviewed and all are negative for acute change except as noted in the HPI. Home Medications   Prior to Admission medications   Medication Sig Start Date End Date Taking? Authorizing Provider   butalbital-acetaminophen-caffeine (FIORICET, ESGIC) 50-325-40 MG per tablet Take 1 tablet by mouth every 8 (eight) hours as needed for headache. 05/03/15   Penni Bombard, MD  Calcium Carbonate-Vitamin D (CALCIUM + D PO) Take by mouth. Calcium 600 mg, vit D3 300 mg    Historical Provider, MD  cetirizine (ZYRTEC) 5 MG tablet Take 5 mg by mouth daily.    Historical Provider, MD  Cyanocobalamin (VITAMIN B 12 PO) Take 5,000 mcg by mouth.    Historical Provider, MD  Echinacea 125 MG CAPS Take by mouth.    Historical Provider, MD  Flax Oil-Fish Oil-Borage Oil (FISH OIL-FLAX OIL-BORAGE OIL) CAPS Take by mouth.    Historical Provider, MD  lisinopril-hydrochlorothiazide (ZESTORETIC) 20-12.5 MG per tablet Take 1 tablet by mouth daily. 04/19/15   Chipper Herb, MD  Magnesium 250 MG TABS Take 250 mg by mouth.    Historical Provider, MD  Multiple Vitamin (MULTIVITAMIN) capsule Take 1 capsule by mouth daily.    Historical Provider, MD  ondansetron (ZOFRAN-ODT) 4 MG disintegrating tablet Take 1 tablet (4 mg total) by mouth every 8 (eight) hours as needed for nausea or vomiting. 05/03/15   Penni Bombard, MD  Oxymetazoline HCl (NASAL SPRAY NA) Place into the nose as needed.    Historical Provider, MD    Allergies  Codeine; Lyrica; and Sulfa antibiotics  Triage Vitals: BP 164/76  mmHg  Pulse 87  Temp(Src) 97.5 F (36.4 C) (Oral)  Resp 20  SpO2 98%  LMP 04/19/2015  Physical Exam  Constitutional: She is oriented to person, place, and time. She appears well-developed and well-nourished. No distress.  HENT:  Head: Normocephalic and atraumatic.  Eyes: EOM are normal.  Neck: Normal range of motion.  Cardiovascular: Normal rate, regular rhythm and normal heart sounds.   Pulmonary/Chest: Effort normal. She has wheezes (bilateral).  Abdominal: Soft. She exhibits no distension. There is no tenderness.  Musculoskeletal: Normal range of motion. She exhibits no edema or tenderness.  Neurological: She is  alert and oriented to person, place, and time.  Skin: Skin is warm and dry.  Psychiatric: She has a normal mood and affect. Judgment normal.  Nursing note and vitals reviewed.   ED Course  Procedures   DIAGNOSTIC STUDIES: Oxygen Saturation is 98% on RA, normal by my interpretation.    COORDINATION OF CARE: 2:56 AM Discussed treatment plan which includes CXR, EKG, lab work, a breathing treatment, and IVF with pt at bedside and pt agreed to plan.  Labs Reviewed  CBC WITH DIFFERENTIAL/PLATELET - Abnormal; Notable for the following:    Eosinophils Relative 6 (*)    All other components within normal limits  BASIC METABOLIC PANEL - Abnormal; Notable for the following:    Potassium 3.2 (*)    Glucose, Bld 106 (*)    All other components within normal limits  TROPONIN I    I, Jola Schmidt, MD, personally reviewed and evaluated these images and lab results as part of my medical decision-making.  Imaging Review Dg Chest 2 View  05/04/2015   CLINICAL DATA:  Acute onset of worsening shortness of breath. Initial encounter.  EXAM: CHEST  2 VIEW  COMPARISON:  Chest radiograph performed 01/31/2007  FINDINGS: The lungs are well-aerated and clear. There is no evidence of focal opacification, pleural effusion or pneumothorax.  The heart is normal in size; the mediastinal contour is within normal limits. No acute osseous abnormalities are seen.  IMPRESSION: No acute cardiopulmonary process seen.   Electronically Signed   By: Garald Balding M.D.   On: 05/04/2015 04:00     EKG Interpretation  Date/Time:  Wednesday May 04 2015 02:47:26 EDT Ventricular Rate:  83 PR Interval:  131 QRS Duration: 94 QT Interval:  386 QTC Calculation: 453 R Axis:   71 Text Interpretation:  Sinus rhythm Low voltage, precordial leads No old tracing to compare Confirmed by Kynadie Yaun  MD, Alantis Bethune ([id number]) on 05/04/2015 4:08:22 AM            MDM   Final diagnoses:  Bronchospasm  Post lumbar puncture headache    5:11 AM Patient feels much better at this time.  Her breathing is returned to baseline.  I suspect this is bronchitis bronchospasm.  Home with albuterol inhaler.  Vital signs normal.  No hypoxia. Regards to her headache is likely post lumbar puncture headache.  She was given IV fluids and a headache cocktail here in the emergency department.  She is feeling much better at this time.  I offered IV caffeine however at this time she would prefer to orally take the caffeine.  Primary care neurology follow-up.  Patient understands to return to the ER for new or worsening symptoms  I personally performed the services described in this documentation, which was scribed in my presence. The recorded information has been reviewed and is accurate.      Jola Schmidt, MD 05/04/15 [phone]

## 2015-05-04 NOTE — Discharge Instructions (Signed)
Bronchospasm A bronchospasm is a spasm or tightening of the airways going into the lungs. During a bronchospasm breathing becomes more difficult because the airways get smaller. When this happens there can be coughing, a whistling sound when breathing (wheezing), and difficulty breathing. Bronchospasm is often associated with asthma, but not all patients who experience a bronchospasm have asthma. CAUSES  A bronchospasm is caused by inflammation or irritation of the airways. The inflammation or irritation may be triggered by:   Allergies (such as to animals, pollen, food, or mold). Allergens that cause bronchospasm may cause wheezing immediately after exposure or many hours later.   Infection. Viral infections are believed to be the most common cause of bronchospasm.   Exercise.   Irritants (such as pollution, cigarette smoke, strong odors, aerosol sprays, and paint fumes).   Weather changes. Winds increase molds and pollens in the air. Rain refreshes the air by washing irritants out. Cold air may cause inflammation.   Stress and emotional upset.  SIGNS AND SYMPTOMS   Wheezing.   Excessive nighttime coughing.   Frequent or severe coughing with a simple cold.   Chest tightness.   Shortness of breath.  DIAGNOSIS  Bronchospasm is usually diagnosed through a history and physical exam. Tests, such as chest X-rays, are sometimes done to look for other conditions. TREATMENT   Inhaled medicines can be given to open up your airways and help you breathe. The medicines can be given using either an inhaler or a nebulizer machine.  Corticosteroid medicines may be given for severe bronchospasm, usually when it is associated with asthma. HOME CARE INSTRUCTIONS   Always have a plan prepared for seeking medical care. Know when to call your health care provider and local emergency services (911 in the [place] Know where you can access local emergency care.  Only take medicines as  directed by your health care provider.  If you were prescribed an inhaler or nebulizer machine, ask your health care provider to explain how to use it correctly. Always use a spacer with your inhaler if you were given one.  It is necessary to remain calm during an attack. Try to relax and breathe more slowly.  Control your home environment in the following ways:   Change your heating and air conditioning filter at least once a month.   Limit your use of fireplaces and wood stoves.  Do not smoke and do not allow smoking in your home.   Avoid exposure to perfumes and fragrances.   Get rid of pests (such as roaches and mice) and their droppings.   Throw away plants if you see mold on them.   Keep your house clean and dust free.   Replace carpet with wood, tile, or vinyl flooring. Carpet can trap dander and dust.   Use allergy-proof pillows, mattress covers, and box spring covers.   Wash bed sheets and blankets every week in hot water and dry them in a dryer.   Use blankets that are made of polyester or cotton.   Wash hands frequently. SEEK MEDICAL CARE IF:   You have muscle aches.   You have chest pain.   The sputum changes from clear or white to yellow, green, gray, or bloody.   The sputum you cough up gets thicker.   There are problems that may be related to the medicine you are given, such as a rash, itching, swelling, or trouble breathing.  SEEK IMMEDIATE MEDICAL CARE IF:   You have worsening wheezing and coughing even  after taking your prescribed medicines.   You have increased difficulty breathing.   You develop severe chest pain. MAKE SURE YOU:   Understand these instructions.  Will watch your condition.  Will get help right away if you are not doing well or get worse. Document Released: 08/23/2003 Document Revised: 08/25/2013 Document Reviewed: 02/09/2013 [facility] Patient Information 2015 [place], [place]. This information is not  intended to replace advice given to you by your health care provider. Make sure you discuss any questions you have with your health care provider.  Spinal Headache A spinal headache is a severe headache that can happen after getting a spinal tap, also called lumbar puncture, or an epidural anesthetic. Both of these procedures involve passing a needle through ligaments that run along the back side of your spinal column and into one of the spaces just above your spinal cord. Sometimes spinal fluid leaks through the temporary hole left by the needle. This leak causes a decrease in spinal fluid pressure, which leads to a spinal headache. The headache usually begins within hours or 1-2 days after the procedure, and it lasts until adequate pressure returns as your body creates more spinal fluid. The headache can last a few days and rarely lasts for more than 1 week. SIGNS AND SYMPTOMS   Severe headache pain when sitting or standing.  Decreased headache pain when lying down.  Neck pain, especially when flexing the neck in a chin-to-chest position.  Vomiting. DIAGNOSIS  Diagnosis of spinal headache is usually made based on your recent medical history. Your health care provider will consider the timing of a recent spinal tap or epidural anesthetic, along with how soon your headache occurred afterward. On rare occasions, tests may be done to confirm the diagnosis, such as an MRI. TREATMENT  Treatment may include:  Drinking extra fluids to improve your level of hydration. This will help your body replace the spinal fluid that has leaked out through the needle hole. Receiving IV fluids may be necessary.  Taking pain medicine as prescribed by your health care provider.  Drinking caffeinated beverages such as soda, coffee, or tea. Caffeine may help to shrink the blood vessels in your brain, which may reduce your headache pain.  Lying flat for a few days.  Having a blood patch procedure, which involves  injecting a small amount of your blood at the puncture site to seal the leak. HOME CARE INSTRUCTIONS  Lie down to relieve pain if your pain gets worse when you sit or stand.  Drink enough fluids to keep your urine clear or pale yellow.  Take pain medicine as directed by your health care provider. SEEK IMMEDIATE MEDICAL CARE IF:   Your pain becomes very severe or cannot be controlled.  You develop a fever.  You have a stiff neck.  You lose bowel or bladder control.  You have trouble walking. MAKE SURE YOU:  Understand these instructions.  Will watch your condition.   Will get help right away if you are not doing well or get worse. Document Released: 02/09/2002 Document Revised: 08/25/2013 Document Reviewed: 03/12/2013 [facility] Patient Information 2015 [place], [place]. This information is not intended to replace advice given to you by your health care provider. Make sure you discuss any questions you have with your health care provider.

## 2015-05-04 NOTE — ED Notes (Addendum)
AVS explained in detail. Knows to follow up with PCP or neurologist. No SOB noted. RR even/unlabored. Ambulatory to wheelchair. Knows proper use of inhaler. No other c/c. Work note given.

## 2015-05-04 NOTE — ED Notes (Signed)
Pt had an LP for idiopathic intercranial hypertension on Friday and states that she started getting SOB the day after that has worsened now. Pt has audible wheezing. Alert and oriented.

## 2015-05-05 MED ORDER — ACETAZOLAMIDE 250 MG PO TABS: 250.0000 mg | ORAL_TABLET | Freq: Two times a day (BID) | ORAL | Status: DC

## 2015-05-05 NOTE — Telephone Encounter (Signed)
Spoke with patient who states she is continuing to have HA when she is not lying flat. She states "it feels like the top of my head is going to fly off if I cough or sneeze".  Transferred her call to Dr Leta Baptist.

## 2015-05-05 NOTE — Telephone Encounter (Signed)
I called patient. Opening pressure high on LP. Some ongoing positional HA, but getting better with caffeine, hydration, rest, fioricet. Nausea has resolved. Hopefully HA will resolve over next few days. Will start acetazolamide for pseudotumor cerebri as well. -VRP

## 2015-05-13 ENCOUNTER — Telehealth: Payer: Self-pay | Admitting: Diagnostic Neuroimaging

## 2015-05-13 NOTE — Telephone Encounter (Signed)
Patient is calling because she has developed hives for the past 3 days after taking medication acetaZOLAMIDE (DIAMOX) 250 MG tablet. Patient is continuing to take the medication but uses Benadryl with it. The patient was not sure if she should stop taking the medication. Please call and advise. Thank you.

## 2015-05-13 NOTE — Telephone Encounter (Signed)
LFt vm for patient about her hives due to taking diamox. Nurse explain on vm message was routed to Dr. Leta Baptist about her reaction.

## 2015-05-13 NOTE — Telephone Encounter (Signed)
I called patient x 2. No answer. I left message on her VM that she should stop acetazolamide and continue benadryl until hives resolve.  Penni Bombard, MD 6/0/1093, 2:35 PM Certified in Neurology, Neurophysiology and Neuroimaging  [facility] [address], [place], [place] [address] [phone]

## 2015-05-16 ENCOUNTER — Telehealth: Payer: Self-pay | Admitting: Diagnostic Neuroimaging

## 2015-05-16 NOTE — Telephone Encounter (Signed)
Pt called and is an employee with [employer], she need approval to have flu and tdap vaccine. Please call and advise [phone]

## 2015-05-16 NOTE — Telephone Encounter (Signed)
Ok to get vaccine. But pls follow up on her hives also. -VRP

## 2015-05-17 NOTE — Telephone Encounter (Signed)
Per Dr Leta Baptist, spoke with patient and informed her she may take vaccines. Inquired about her hives, and she states she developed hives last Wed night around10 pm and they  lasted two days. She stopped Diamox Fri night and began Benadryl. She states she feels hives may have been due to [place] she ate. She is asking if she may try taking Diamox again. Transferred her call to Dr Leta Baptist at his request.

## 2015-05-17 NOTE — Telephone Encounter (Signed)
Patient started acetazolamide medication on 05/05/15 evening. No hives. Then 05/11/15 ate ginger chicken from a new chinese restaurant around 9pm, then 1-2 hrs later developed hives. Took benadryl Thursday AM, continued acetazolamide until Friday morning then stopped. Hives started reducing Friday later in the day. Now hives resolved. Therefore in retrospect, patient think her hives resulted from a food allergy reaction and not medication reaction.  Patient wants to try acetazolamide one more time. She will have benadryl with her at all times. Caution about allergic reaction and anaphylaxis reviewed with patient. She will go to ER if any severe symptoms occur.   Penni Bombard, MD 5/32/9924, 2:68 AM Certified in Neurology, Neurophysiology and Neuroimaging  [facility] [address], [place], [place] [address] [phone]

## 2015-05-25 ENCOUNTER — Telehealth: Payer: Self-pay | Admitting: *Deleted

## 2015-05-25 ENCOUNTER — Encounter: Payer: Self-pay | Admitting: Diagnostic Neuroimaging

## 2015-05-25 NOTE — Telephone Encounter (Signed)
Form,[employer] received from [place],sent to [facility] C and Dr Leta Baptist 05/25/15.

## 2015-05-25 NOTE — Telephone Encounter (Signed)
3:04 pm Placed on Dr [employer] desk for review, completion, signature.

## 2015-05-26 ENCOUNTER — Telehealth: Payer: Self-pay | Admitting: *Deleted

## 2015-05-26 DIAGNOSIS — Z0289 Encounter for other administrative examinations: Secondary | ICD-10-CM

## 2015-05-26 NOTE — Telephone Encounter (Signed)
Patient is calling regarding the FMLA paperwork received yesterday. She states [employer] says the paperwork needs be be faxed back to them by Monday 05-30-15 or the FMLA will be cancelled for the patient.  Please call patient and advise. Thank you.

## 2015-05-26 NOTE — Telephone Encounter (Signed)
Spoke to patient. She said original FMLA was filled out incorrectly,  (incomplete) per [employer].

## 2015-05-26 NOTE — Telephone Encounter (Signed)
Form,[employer] received,completed by Leilani Able and Dr Leta Baptist faxed 05/26/15.

## 2015-05-26 NOTE — Telephone Encounter (Signed)
Called patient. Informed Dr. Mamie Nick filled out forms and medical records faxed to [employer]. Patient was very grateful.

## 2015-06-12 ENCOUNTER — Other Ambulatory Visit: Payer: Self-pay | Admitting: Family Medicine

## 2015-06-15 ENCOUNTER — Ambulatory Visit: Payer: 59 | Admitting: Family

## 2015-06-16 ENCOUNTER — Ambulatory Visit: Payer: 59 | Admitting: Family

## 2015-06-23 ENCOUNTER — Ambulatory Visit (INDEPENDENT_AMBULATORY_CARE_PROVIDER_SITE_OTHER): Payer: 59 | Admitting: Family

## 2015-06-23 ENCOUNTER — Encounter: Payer: Self-pay | Admitting: Family

## 2015-06-23 VITALS — BP 113/74 | HR 74 | Temp 96.9°F | Ht 66.0 in | Wt 251.0 lb

## 2015-06-23 DIAGNOSIS — G932 Benign intracranial hypertension: Secondary | ICD-10-CM | POA: Diagnosis not present

## 2015-06-23 DIAGNOSIS — E785 Hyperlipidemia, unspecified: Secondary | ICD-10-CM

## 2015-06-23 DIAGNOSIS — I10 Essential (primary) hypertension: Secondary | ICD-10-CM

## 2015-06-23 NOTE — Patient Instructions (Signed)
Health Maintenance, Female Adopting a healthy lifestyle and getting preventive care can go a long way to promote health and wellness. Talk with your health care provider about what schedule of regular examinations is right for you. This is a good chance for you to check in with your provider about disease prevention and staying healthy. In between checkups, there are plenty of things you can do on your own. Experts have done a lot of research about which lifestyle changes and preventive measures are most likely to keep you healthy. Ask your health care provider for more information. WEIGHT AND DIET  Eat a healthy diet  Be sure to include plenty of vegetables, fruits, low-fat dairy products, and lean protein.  Do not eat a lot of foods high in solid fats, added sugars, or salt.  Get regular exercise. This is one of the most important things you can do for your health.  Most adults should exercise for at least 150 minutes each week. The exercise should increase your heart rate and make you sweat (moderate-intensity exercise).  Most adults should also do strengthening exercises at least twice a week. This is in addition to the moderate-intensity exercise.  Maintain a healthy weight  Body mass index (BMI) is a measurement that can be used to identify possible weight problems. It estimates body fat based on height and weight. Your health care provider can help determine your BMI and help you achieve or maintain a healthy weight.  For females 20 years of age and older:   A BMI below 18.5 is considered underweight.  A BMI of 18.5 to 24.9 is normal.  A BMI of 25 to 29.9 is considered overweight.  A BMI of 30 and above is considered obese.  Watch levels of cholesterol and blood lipids  You should start having your blood tested for lipids and cholesterol at 42 years of age, then have this test every 5 years.  You may need to have your cholesterol levels checked more often if:  Your lipid  or cholesterol levels are high.  You are older than 42 years of age.  You are at high risk for heart disease.  CANCER SCREENING   Lung Cancer  Lung cancer screening is recommended for adults 55-80 years old who are at high risk for lung cancer because of a history of smoking.  A yearly low-dose CT scan of the lungs is recommended for people who:  Currently smoke.  Have quit within the past 15 years.  Have at least a 30-pack-year history of smoking. A pack year is smoking an average of one pack of cigarettes a day for 1 year.  Yearly screening should continue until it has been 15 years since you quit.  Yearly screening should stop if you develop a health problem that would prevent you from having lung cancer treatment.  Breast Cancer  Practice breast self-awareness. This means understanding how your breasts normally appear and feel.  It also means doing regular breast self-exams. Let your health care provider know about any changes, no matter how small.  If you are in your 20s or 30s, you should have a clinical breast exam (CBE) by a health care provider every 1-3 years as part of a regular health exam.  If you are 40 or older, have a CBE every year. Also consider having a breast X-ray (mammogram) every year.  If you have a family history of breast cancer, talk to your health care provider about genetic screening.  If you   are at high risk for breast cancer, talk to your health care provider about having an MRI and a mammogram every year.  Breast cancer gene (BRCA) assessment is recommended for women who have family members with BRCA-related cancers. BRCA-related cancers include:  Breast.  Ovarian.  Tubal.  Peritoneal cancers.  Results of the assessment will determine the need for genetic counseling and BRCA1 and BRCA2 testing. Cervical Cancer Your health care provider may recommend that you be screened regularly for cancer of the pelvic organs (ovaries, uterus, and  vagina). This screening involves a pelvic examination, including checking for microscopic changes to the surface of your cervix (Pap test). You may be encouraged to have this screening done every 3 years, beginning at age 21.  For women ages 30-65, health care providers may recommend pelvic exams and Pap testing every 3 years, or they may recommend the Pap and pelvic exam, combined with testing for human papilloma virus (HPV), every 5 years. Some types of HPV increase your risk of cervical cancer. Testing for HPV may also be done on women of any age with unclear Pap test results.  Other health care providers may not recommend any screening for nonpregnant women who are considered low risk for pelvic cancer and who do not have symptoms. Ask your health care provider if a screening pelvic exam is right for you.  If you have had past treatment for cervical cancer or a condition that could lead to cancer, you need Pap tests and screening for cancer for at least 20 years after your treatment. If Pap tests have been discontinued, your risk factors (such as having a new sexual partner) need to be reassessed to determine if screening should resume. Some women have medical problems that increase the chance of getting cervical cancer. In these cases, your health care provider may recommend more frequent screening and Pap tests. Colorectal Cancer  This type of cancer can be detected and often prevented.  Routine colorectal cancer screening usually begins at 42 years of age and continues through 42 years of age.  Your health care provider may recommend screening at an earlier age if you have risk factors for colon cancer.  Your health care provider may also recommend using home test kits to check for hidden blood in the stool.  A small camera at the end of a tube can be used to examine your colon directly (sigmoidoscopy or colonoscopy). This is done to check for the earliest forms of colorectal  cancer.  Routine screening usually begins at age 50.  Direct examination of the colon should be repeated every 5-10 years through 42 years of age. However, you may need to be screened more often if early forms of precancerous polyps or small growths are found. Skin Cancer  Check your skin from head to toe regularly.  Tell your health care provider about any new moles or changes in moles, especially if there is a change in a mole's shape or color.  Also tell your health care provider if you have a mole that is larger than the size of a pencil eraser.  Always use sunscreen. Apply sunscreen liberally and repeatedly throughout the day.  Protect yourself by wearing long sleeves, pants, a wide-brimmed hat, and sunglasses whenever you are outside. HEART DISEASE, DIABETES, AND HIGH BLOOD PRESSURE   High blood pressure causes heart disease and increases the risk of stroke. High blood pressure is more likely to develop in:  People who have blood pressure in the high end   of the normal range (130-139/85-89 mm Hg).  People who are overweight or obese.  People who are African American.  If you are 38-23 years of age, have your blood pressure checked every 3-5 years. If you are 61 years of age or older, have your blood pressure checked every year. You should have your blood pressure measured twice--once when you are at a hospital or clinic, and once when you are not at a hospital or clinic. Record the average of the two measurements. To check your blood pressure when you are not at a hospital or clinic, you can use:  An automated blood pressure machine at a pharmacy.  A home blood pressure monitor.  If you are between 45 years and 39 years old, ask your health care provider if you should take aspirin to prevent strokes.  Have regular diabetes screenings. This involves taking a blood sample to check your fasting blood sugar level.  If you are at a normal weight and have a low risk for diabetes,  have this test once every three years after 42 years of age.  If you are overweight and have a high risk for diabetes, consider being tested at a younger age or more often. PREVENTING INFECTION  Hepatitis B  If you have a higher risk for hepatitis B, you should be screened for this virus. You are considered at high risk for hepatitis B if:  You were born in a country where hepatitis B is common. Ask your health care provider which countries are considered high risk.  Your parents were born in a high-risk country, and you have not been immunized against hepatitis B (hepatitis B vaccine).  You have HIV or AIDS.  You use needles to inject street drugs.  You live with someone who has hepatitis B.  You have had sex with someone who has hepatitis B.  You get hemodialysis treatment.  You take certain medicines for conditions, including cancer, organ transplantation, and autoimmune conditions. Hepatitis C  Blood testing is recommended for:  Everyone born from [birth year] through 1963.  Anyone with known risk factors for hepatitis C. Sexually transmitted infections (STIs)  You should be screened for sexually transmitted infections (STIs) including gonorrhea and chlamydia if:  You are sexually active and are younger than 42 years of age.  You are older than 42 years of age and your health care provider tells you that you are at risk for this type of infection.  Your sexual activity has changed since you were last screened and you are at an increased risk for chlamydia or gonorrhea. Ask your health care provider if you are at risk.  If you do not have HIV, but are at risk, it may be recommended that you take a prescription medicine daily to prevent HIV infection. This is called pre-exposure prophylaxis (PrEP). You are considered at risk if:  You are sexually active and do not regularly use condoms or know the HIV status of your partner(s).  You take drugs by injection.  You are sexually  active with a partner who has HIV. Talk with your health care provider about whether you are at high risk of being infected with HIV. If you choose to begin PrEP, you should first be tested for HIV. You should then be tested every 3 months for as long as you are taking PrEP.  PREGNANCY   If you are premenopausal and you may become pregnant, ask your health care provider about preconception counseling.  If you may  become pregnant, take 400 to 800 micrograms (mcg) of folic acid every day.  If you want to prevent pregnancy, talk to your health care provider about birth control (contraception). OSTEOPOROSIS AND MENOPAUSE   Osteoporosis is a disease in which the bones lose minerals and strength with aging. This can result in serious bone fractures. Your risk for osteoporosis can be identified using a bone density scan.  If you are 61 years of age or older, or if you are at risk for osteoporosis and fractures, ask your health care provider if you should be screened.  Ask your health care provider whether you should take a calcium or vitamin D supplement to lower your risk for osteoporosis.  Menopause may have certain physical symptoms and risks.  Hormone replacement therapy may reduce some of these symptoms and risks. Talk to your health care provider about whether hormone replacement therapy is right for you.  HOME CARE INSTRUCTIONS   Schedule regular health, dental, and eye exams.  Stay current with your immunizations.   Do not use any tobacco products including cigarettes, chewing tobacco, or electronic cigarettes.  If you are pregnant, do not drink alcohol.  If you are breastfeeding, limit how much and how often you drink alcohol.  Limit alcohol intake to no more than 1 drink per day for nonpregnant women. One drink equals 12 ounces of beer, 5 ounces of wine, or 1 ounces of hard liquor.  Do not use street drugs.  Do not share needles.  Ask your health care provider for help if  you need support or information about quitting drugs.  Tell your health care provider if you often feel depressed.  Tell your health care provider if you have ever been abused or do not feel safe at home.   This information is not intended to replace advice given to you by your health care provider. Make sure you discuss any questions you have with your health care provider.   Document Released: 03/05/2011 Document Revised: 09/10/2014 Document Reviewed: 07/22/2013 Elsevier Interactive Patient Education [employer].

## 2015-06-23 NOTE — Progress Notes (Signed)
   Subjective:    Patient ID: [name], female    DOB: 06-17-1971, 44 y.o.   MRN: [id number]  Pt presents to the office today for chronic follow up. Pt has idiopathic intracranial hypertension and is followed by Dr. Leta Baptist, neurologists every 3 months.   Hypertension This is a chronic problem. The current episode started more than 1 year ago. The problem has been resolved since onset. The problem is controlled. Pertinent negatives include no anxiety, chest pain, headaches, malaise/fatigue, palpitations, peripheral edema or shortness of breath. Risk factors for coronary artery disease include dyslipidemia, obesity, post-menopausal state, sedentary lifestyle and family history. Past treatments include ACE inhibitors and diuretics. The current treatment provides moderate improvement. There is no history of kidney disease, CAD/MI, CVA, heart failure or a thyroid problem. There is no history of sleep apnea.  Hyperlipidemia This is a chronic problem. The current episode started more than 1 month ago. The problem is uncontrolled. Recent lipid tests were reviewed and are high. She has no history of diabetes. Pertinent negatives include no chest pain, leg pain or shortness of breath. Current antihyperlipidemic treatment includes diet change. The current treatment provides mild improvement of lipids. Risk factors for coronary artery disease include dyslipidemia, hypertension, obesity, a sedentary lifestyle, family history and post-menopausal.      Review of Systems  Constitutional: Negative.  Negative for malaise/fatigue.  HENT: Negative.   Eyes: Negative.   Respiratory: Negative.  Negative for shortness of breath.   Cardiovascular: Negative.  Negative for chest pain and palpitations.  Gastrointestinal: Negative.   Endocrine: Negative.   Genitourinary: Negative.   Musculoskeletal: Negative.   Neurological: Negative.  Negative for headaches.  Hematological: Negative.   Psychiatric/Behavioral:  Negative.   All other systems reviewed and are negative.      Objective:   Physical Exam  Constitutional: She is oriented to person, place, and time. She appears well-developed and well-nourished. No distress.  HENT:  Head: Normocephalic and atraumatic.  Right Ear: External ear normal.  Mouth/Throat: Oropharynx is clear and moist.  Eyes: Pupils are equal, round, and reactive to light.  Neck: Normal range of motion. Neck supple. No thyromegaly present.  Cardiovascular: Normal rate, regular rhythm, normal heart sounds and intact distal pulses.   No murmur heard. Pulmonary/Chest: Effort normal and breath sounds normal. No respiratory distress. She has no wheezes.  Abdominal: Soft. Bowel sounds are normal. She exhibits no distension. There is no tenderness.  Musculoskeletal: Normal range of motion. She exhibits no edema or tenderness.  Neurological: She is alert and oriented to person, place, and time. She has normal reflexes. No cranial nerve deficit.  Skin: Skin is warm and dry.  Psychiatric: She has a normal mood and affect. Her behavior is normal. Judgment and thought content normal.  Vitals reviewed.     BP 113/74 mmHg  Pulse 74  Temp(Src) 96.9 F (36.1 C)  Ht 5' 6" (1.676 m)  Wt 251 lb (113.853 kg)  BMI 40.53 kg/m2     Assessment & Plan:  1. Essential hypertension - CMP14+EGFR  2. Idiopathic intracranial hypertension - CMP14+EGFR  3. Hyperlipidemia - CMP14+EGFR - Lipid panel   Continue all meds Labs pending Health Maintenance reviewed Diet and exercise encouraged RTO 6  months  [name], FNP

## 2015-06-24 LAB — CMP14+EGFR
ALBUMIN: 4.4 g/dL (ref 3.5–5.5)
ALK PHOS: 48 IU/L (ref 39–117)
ALT: 19 IU/L (ref 0–32)
AST: 17 IU/L (ref 0–40)
Albumin/Globulin Ratio: 1.8 (ref 1.1–2.5)
BILIRUBIN TOTAL: 0.5 mg/dL (ref 0.0–1.2)
BUN / CREAT RATIO: 17 (ref 9–23)
BUN: 13 mg/dL (ref 6–24)
CHLORIDE: 103 mmol/L (ref 97–106)
CO2: 21 mmol/L (ref 18–29)
Calcium: 9.5 mg/dL (ref 8.7–10.2)
Creatinine, Ser: 0.75 mg/dL (ref 0.57–1.00)
GFR calc Af Amer: 114 mL/min/{1.73_m2} (ref >59)
GFR calc non Af Amer: 99 mL/min/{1.73_m2} (ref >59)
GLUCOSE: 92 mg/dL (ref 65–99)
Globulin, Total: 2.5 g/dL (ref 1.5–4.5)
Potassium: 3.5 mmol/L (ref 3.5–5.2)
SODIUM: 141 mmol/L (ref 136–144)
Total Protein: 6.9 g/dL (ref 6.0–8.5)

## 2015-06-24 LAB — LIPID PANEL
CHOLESTEROL TOTAL: 180 mg/dL (ref 100–199)
Chol/HDL Ratio: 3.5 ratio units (ref 0.0–4.4)
HDL: 52 mg/dL (ref >39)
LDL Calculated: 102 mg/dL — ABNORMAL HIGH (ref 0–99)
Triglycerides: 132 mg/dL (ref 0–149)
VLDL CHOLESTEROL CAL: 26 mg/dL (ref 5–40)

## 2015-08-05 ENCOUNTER — Ambulatory Visit (INDEPENDENT_AMBULATORY_CARE_PROVIDER_SITE_OTHER): Payer: 59 | Admitting: Diagnostic Neuroimaging

## 2015-08-05 ENCOUNTER — Encounter: Payer: Self-pay | Admitting: Diagnostic Neuroimaging

## 2015-08-05 VITALS — BP 112/74 | HR 72 | Ht 66.0 in | Wt 251.6 lb

## 2015-08-05 DIAGNOSIS — G932 Benign intracranial hypertension: Secondary | ICD-10-CM

## 2015-08-05 DIAGNOSIS — R0683 Snoring: Secondary | ICD-10-CM | POA: Diagnosis not present

## 2015-08-05 MED ORDER — ACETAZOLAMIDE 250 MG PO TABS: 500.0000 mg | ORAL_TABLET | Freq: Two times a day (BID) | ORAL | Status: DC
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
## 2015-08-05 NOTE — Patient Instructions (Signed)

## 2015-08-24 ENCOUNTER — Encounter: Payer: Self-pay | Admitting: Neurology

## 2015-08-24 ENCOUNTER — Ambulatory Visit (INDEPENDENT_AMBULATORY_CARE_PROVIDER_SITE_OTHER): Payer: 59 | Admitting: Neurology

## 2015-08-24 VITALS — BP 115/69 | HR 77 | Resp 18 | Ht 66.0 in | Wt 249.5 lb

## 2015-08-24 DIAGNOSIS — R51 Headache: Secondary | ICD-10-CM | POA: Diagnosis not present

## 2015-08-24 DIAGNOSIS — G4726 Circadian rhythm sleep disorder, shift work type: Secondary | ICD-10-CM

## 2015-08-24 DIAGNOSIS — R0683 Snoring: Secondary | ICD-10-CM

## 2015-08-24 DIAGNOSIS — R519 Headache, unspecified: Secondary | ICD-10-CM
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
## 2015-08-24 NOTE — Patient Instructions (Signed)

## 2015-09-07 DIAGNOSIS — H52223 Regular astigmatism, bilateral: Secondary | ICD-10-CM | POA: Diagnosis not present

## 2015-09-07 DIAGNOSIS — H5213 Myopia, bilateral: Secondary | ICD-10-CM | POA: Diagnosis not present

## 2015-09-12 MED FILL — acetaZOLAMIDE 250 MG TABS: 250 | 30 days supply | Qty: 120 | Fill #3

## 2015-09-16 ENCOUNTER — Ambulatory Visit (INDEPENDENT_AMBULATORY_CARE_PROVIDER_SITE_OTHER): Payer: 59 | Admitting: Neurology

## 2015-09-16 DIAGNOSIS — G4733 Obstructive sleep apnea (adult) (pediatric): Secondary | ICD-10-CM

## 2015-09-16 DIAGNOSIS — G4720 Circadian rhythm sleep disorder, unspecified type: Secondary | ICD-10-CM

## 2015-09-17 NOTE — Sleep Study (Signed)
Please see the scanned sleep study interpretation located in the procedure tab in the chart view section.  

## 2015-09-19 ENCOUNTER — Telehealth: Payer: Self-pay | Admitting: Neurology

## 2015-09-19 NOTE — Telephone Encounter (Signed)
Patient referred by Dr. Leta Baptist, seen by me on 08/24/15, diagnostic PSG on 09/16/15.   Please call and notify the patient that the recent sleep study did not show any significant obstructive sleep apnea. She has an overall normal AHI and borderline sleep apnea in supine sleep and mild sleep disordered breathing while in REM sleep. No significant desaturations were noted. Sleeping off her back and weight loss likely will suffice as treatment; CPAP therapy is not warranted. Please inform patient that she can FU with Dr. Leta Baptist. Also, route or fax report to PCP and referring MD, if other than PCP.   Please remind patient to try to maintain good sleep hygiene, which means: Keep a regular sleep and wake schedule, try not to exercise or have a meal within 2 hours of your bedtime, try to keep your bedroom conducive for sleep, that is, cool and dark, without light distractors such as an illuminated alarm clock, and refrain from watching TV right before sleep or in the middle of the night and do not keep the TV or radio on during the night. Also, try not to use or play on electronic devices at bedtime, such as your cell phone, tablet PC or laptop. If you like to read at bedtime on an electronic device, try to dim the background light as much as possible. Do not eat in the middle of the night.   Once you have spoken to patient, you can close this encounter.   Thanks,  Star Age, MD, PhD [facility])

## 2015-09-20 NOTE — Telephone Encounter (Signed)
Left message for patient to call back  

## 2015-09-22 NOTE — Telephone Encounter (Signed)
I spoke to patient and she is aware of results and recommendations. She will f/u with Dr. Leta Baptist as planned. I will send her a flyer with sleep hygiene reminders.

## 2015-10-13 MED FILL — acetaZOLAMIDE 250 MG TABS: 250 | 30 days supply | Qty: 120 | Fill #4

## 2015-10-24 ENCOUNTER — Other Ambulatory Visit: Payer: Self-pay | Admitting: Family Medicine

## 2015-10-24 MED FILL — LISINOPRIL-HCTZ 20-12.5 MG: 20-12.5 | 90 days supply | Qty: 90 | Fill #0

## 2015-10-28 ENCOUNTER — Ambulatory Visit (INDEPENDENT_AMBULATORY_CARE_PROVIDER_SITE_OTHER): Payer: 59 | Admitting: Nurse Practitioner

## 2015-10-28 ENCOUNTER — Emergency Department (HOSPITAL_COMMUNITY): Payer: 59 | Admitting: Anesthesiology

## 2015-10-28 ENCOUNTER — Emergency Department (HOSPITAL_COMMUNITY): Payer: 59

## 2015-10-28 ENCOUNTER — Encounter (HOSPITAL_COMMUNITY): Payer: Self-pay | Admitting: Emergency Medicine

## 2015-10-28 ENCOUNTER — Observation Stay (HOSPITAL_COMMUNITY)
Admission: EM | Admit: 2015-10-28 | Discharge: 2015-10-29 | Disposition: A | Discharge status: Home / Self Care | Payer: 59 | Attending: Surgery | Admitting: Surgery

## 2015-10-28 ENCOUNTER — Encounter (HOSPITAL_COMMUNITY)
Admission: EM | Disposition: A | Discharge status: Home / Self Care | Payer: Self-pay | Source: Home / Self Care | Attending: Emergency Medicine

## 2015-10-28 ENCOUNTER — Encounter: Payer: Self-pay | Admitting: Nurse Practitioner

## 2015-10-28 VITALS — BP 130/73 | HR 77 | Temp 97.0°F | Ht 66.0 in | Wt 241.0 lb

## 2015-10-28 DIAGNOSIS — Z79899 Other long term (current) drug therapy: Secondary | ICD-10-CM | POA: Diagnosis not present

## 2015-10-28 DIAGNOSIS — I10 Essential (primary) hypertension: Secondary | ICD-10-CM | POA: Insufficient documentation

## 2015-10-28 DIAGNOSIS — Z6839 Body mass index (BMI) 39.0-39.9, adult: Secondary | ICD-10-CM | POA: Diagnosis not present

## 2015-10-28 DIAGNOSIS — R1012 Left upper quadrant pain: Secondary | ICD-10-CM | POA: Diagnosis not present

## 2015-10-28 DIAGNOSIS — K353 Acute appendicitis with localized peritonitis: Secondary | ICD-10-CM | POA: Diagnosis not present

## 2015-10-28 DIAGNOSIS — Z87728 Personal history of other specified (corrected) congenital malformations of nervous system and sense organs: Secondary | ICD-10-CM | POA: Diagnosis not present

## 2015-10-28 DIAGNOSIS — R1010 Upper abdominal pain, unspecified: Secondary | ICD-10-CM | POA: Diagnosis not present

## 2015-10-28 DIAGNOSIS — K3580 Unspecified acute appendicitis: Principal | ICD-10-CM | POA: Insufficient documentation

## 2015-10-28 DIAGNOSIS — K352 Acute appendicitis with generalized peritonitis, without abscess: Secondary | ICD-10-CM

## 2015-10-28 HISTORY — DX: Benign intracranial hypertension: G93.2

## 2015-10-28 HISTORY — DX: Essential (primary) hypertension: I10

## 2015-10-28 HISTORY — PX: LAPAROSCOPIC APPENDECTOMY: SHX408

## 2015-10-28 LAB — I-STAT CHEM 8, ED
BUN: 13 mg/dL (ref 6–20)
Calcium, Ion: 1.27 mmol/L — ABNORMAL HIGH (ref 1.12–1.23)
Chloride: 106 mmol/L (ref 101–111)
Creatinine, Ser: 0.7 mg/dL (ref 0.44–1.00)
Glucose, Bld: 104 mg/dL — ABNORMAL HIGH (ref 65–99)
HEMATOCRIT: 44 % (ref 36.0–46.0)
HEMOGLOBIN: 15 g/dL (ref 12.0–15.0)
Potassium: 3.1 mmol/L — ABNORMAL LOW (ref 3.5–5.1)
SODIUM: 140 mmol/L (ref 135–145)
TCO2: 17 mmol/L (ref 0–100)

## 2015-10-28 LAB — CBC
HEMATOCRIT: 38.8 % (ref 36.0–46.0)
Hemoglobin: 13.2 g/dL (ref 12.0–15.0)
MCH: 30.2 pg (ref 26.0–34.0)
MCHC: 34 g/dL (ref 30.0–36.0)
MCV: 88.8 fL (ref 78.0–100.0)
PLATELETS: 224 10*3/uL (ref 150–400)
RBC: 4.37 MIL/uL (ref 3.87–5.11)
RDW: 13 % (ref 11.5–15.5)
WBC: 15 10*3/uL — AB (ref 4.0–10.5)

## 2015-10-28 LAB — COMPREHENSIVE METABOLIC PANEL
ALBUMIN: 4.2 g/dL (ref 3.5–5.0)
ALT: 19 U/L (ref 14–54)
AST: 16 U/L (ref 15–41)
Alkaline Phosphatase: 39 U/L (ref 38–126)
Anion gap: 11 (ref 5–15)
BILIRUBIN TOTAL: 1 mg/dL (ref 0.3–1.2)
BUN: 15 mg/dL (ref 6–20)
CALCIUM: 9.5 mg/dL (ref 8.9–10.3)
CHLORIDE: 109 mmol/L (ref 101–111)
CO2: 17 mmol/L — ABNORMAL LOW (ref 22–32)
Creatinine, Ser: 0.83 mg/dL (ref 0.44–1.00)
GFR calc Af Amer: >60 mL/min (ref >60)
Glucose, Bld: 105 mg/dL — ABNORMAL HIGH (ref 65–99)
POTASSIUM: 3.1 mmol/L — AB (ref 3.5–5.1)
Sodium: 137 mmol/L (ref 135–145)
TOTAL PROTEIN: 7.3 g/dL (ref 6.5–8.1)

## 2015-10-28 LAB — URINALYSIS, ROUTINE W REFLEX MICROSCOPIC
Bilirubin Urine: NEGATIVE
GLUCOSE, UA: NEGATIVE mg/dL
Hgb urine dipstick: NEGATIVE
Ketones, ur: 40 mg/dL — AB
NITRITE: NEGATIVE
PH: 5 (ref 5.0–8.0)
Protein, ur: NEGATIVE mg/dL
Specific Gravity, Urine: 1.024 (ref 1.005–1.030)

## 2015-10-28 LAB — I-STAT BETA HCG BLOOD, ED (MC, WL, AP ONLY): I-stat hCG, quantitative: <5 m[IU]/mL (ref <5)

## 2015-10-28 LAB — URINE MICROSCOPIC-ADD ON

## 2015-10-28 LAB — LIPASE, BLOOD: LIPASE: 28 U/L (ref 11–51)

## 2015-10-28 SURGERY — APPENDECTOMY, LAPAROSCOPIC
Anesthesia: General | Site: Abdomen

## 2015-10-28 MED ORDER — FAMOTIDINE IN NACL 20-0.9 MG/50ML-% IV SOLN
20.0000 mg | Freq: Once | INTRAVENOUS | Status: AC
  Administered 2015-10-28: 20 mg via INTRAVENOUS
  Filled 2015-10-28: qty 50

## 2015-10-28 MED ORDER — METRONIDAZOLE IN NACL 5-0.79 MG/ML-% IV SOLN
INTRAVENOUS | Status: AC
  Filled 2015-10-28: qty 100

## 2015-10-28 MED ORDER — MIDAZOLAM HCL 5 MG/5ML IJ SOLN
INTRAMUSCULAR | Status: DC | PRN
  Administered 2015-10-28: 2 mg via INTRAVENOUS

## 2015-10-28 MED ORDER — HYDROMORPHONE HCL 1 MG/ML IJ SOLN
1.0000 mg | Freq: Once | INTRAMUSCULAR | Status: AC
  Administered 2015-10-28: 1 mg via INTRAVENOUS
  Filled 2015-10-28: qty 1

## 2015-10-28 MED ORDER — ACETAZOLAMIDE 250 MG PO TABS
250.0000 mg | ORAL_TABLET | Freq: Once | ORAL | Status: AC
  Administered 2015-10-28: 250 mg via ORAL
  Filled 2015-10-28: qty 1

## 2015-10-28 MED ORDER — ONDANSETRON HCL 4 MG/2ML IJ SOLN: 4.0000 mg | Freq: Once | INTRAMUSCULAR | Status: DC

## 2015-10-28 MED ORDER — ROCURONIUM BROMIDE 100 MG/10ML IV SOLN
INTRAVENOUS | Status: DC | PRN
  Administered 2015-10-28: 30 mg via INTRAVENOUS

## 2015-10-28 MED ORDER — LIDOCAINE HCL (CARDIAC) 20 MG/ML IV SOLN
INTRAVENOUS | Status: AC
  Filled 2015-10-28: qty 5

## 2015-10-28 MED ORDER — IOHEXOL 300 MG/ML  SOLN
100.0000 mL | Freq: Once | INTRAMUSCULAR | Status: AC | PRN
  Administered 2015-10-28: 100 mL via INTRAVENOUS

## 2015-10-28 MED ORDER — METRONIDAZOLE IN NACL 5-0.79 MG/ML-% IV SOLN
500.0000 mg | Freq: Once | INTRAVENOUS | Status: AC
  Administered 2015-10-28: 500 mg via INTRAVENOUS
  Filled 2015-10-28: qty 100

## 2015-10-28 MED ORDER — HYDROMORPHONE HCL 1 MG/ML IJ SOLN: 1.0000 mg | Freq: Once | INTRAMUSCULAR | Status: DC

## 2015-10-28 MED ORDER — SODIUM CHLORIDE 0.9 % IV BOLUS (SEPSIS)
1000.0000 mL | Freq: Once | INTRAVENOUS | Status: AC
  Administered 2015-10-28: 1000 mL via INTRAVENOUS

## 2015-10-28 MED ORDER — BUPIVACAINE-EPINEPHRINE (PF) 0.25% -1:200000 IJ SOLN
INTRAMUSCULAR | Status: AC
  Filled 2015-10-28: qty 30

## 2015-10-28 MED ORDER — PHENYLEPHRINE HCL 10 MG/ML IJ SOLN
INTRAMUSCULAR | Status: DC | PRN
  Administered 2015-10-28 (×2): 80 ug via INTRAVENOUS

## 2015-10-28 MED ORDER — PROPOFOL 10 MG/ML IV BOLUS
INTRAVENOUS | Status: AC
  Filled 2015-10-28: qty 20

## 2015-10-28 MED ORDER — MIDAZOLAM HCL 2 MG/2ML IJ SOLN
INTRAMUSCULAR | Status: AC
  Filled 2015-10-28: qty 2

## 2015-10-28 MED ORDER — SUCCINYLCHOLINE CHLORIDE 20 MG/ML IJ SOLN
INTRAMUSCULAR | Status: DC | PRN
  Administered 2015-10-28: 100 mg via INTRAVENOUS

## 2015-10-28 MED ORDER — DEXTROSE 5 % IV SOLN
2.0000 g | Freq: Once | INTRAVENOUS | Status: AC
  Administered 2015-10-28: 2 g via INTRAVENOUS
  Filled 2015-10-28: qty 2

## 2015-10-28 MED ORDER — FENTANYL CITRATE (PF) 100 MCG/2ML IJ SOLN
INTRAMUSCULAR | Status: DC | PRN
  Administered 2015-10-28: 100 ug via INTRAVENOUS
  Administered 2015-10-29: 50 ug via INTRAVENOUS

## 2015-10-28 MED ORDER — PROPOFOL 10 MG/ML IV BOLUS
INTRAVENOUS | Status: DC | PRN
  Administered 2015-10-28: 180 mg via INTRAVENOUS

## 2015-10-28 MED ORDER — FENTANYL CITRATE (PF) 250 MCG/5ML IJ SOLN
INTRAMUSCULAR | Status: AC
  Filled 2015-10-28: qty 5

## 2015-10-28 MED ORDER — SCOPOLAMINE 1 MG/3DAYS TD PT72
MEDICATED_PATCH | TRANSDERMAL | Status: AC
  Filled 2015-10-28: qty 1

## 2015-10-28 MED ORDER — MORPHINE SULFATE (PF) 4 MG/ML IV SOLN
6.0000 mg | Freq: Once | INTRAVENOUS | Status: AC
  Administered 2015-10-28: 6 mg via INTRAVENOUS
  Filled 2015-10-28: qty 2

## 2015-10-28 MED ORDER — ONDANSETRON 4 MG PO TBDP
4.0000 mg | ORAL_TABLET | Freq: Once | ORAL | Status: AC | PRN
  Administered 2015-10-28: 4 mg via ORAL
  Filled 2015-10-28: qty 1

## 2015-10-28 MED ORDER — LIDOCAINE HCL (CARDIAC) 20 MG/ML IV SOLN
INTRAVENOUS | Status: DC | PRN
  Administered 2015-10-28: 50 mg via INTRAVENOUS

## 2015-10-28 SURGICAL SUPPLY — 34 items
APL SKNCLS STERI-STRIP NONHPOA (GAUZE/BANDAGES/DRESSINGS) ×1
APPLIER CLIP ROT 10 11.4 M/L (STAPLE)
APR CLP MED LRG 11.4X10 (STAPLE)
BAG SPEC RTRVL LRG 6X4 10 (ENDOMECHANICALS) ×1
BENZOIN TINCTURE PRP APPL 2/3 (GAUZE/BANDAGES/DRESSINGS) ×2 IMPLANT
CLIP APPLIE ROT 10 11.4 M/L (STAPLE) IMPLANT
COVER SURGICAL LIGHT HANDLE (MISCELLANEOUS) ×2 IMPLANT
CUTTER FLEX LINEAR 45M (STAPLE) ×1 IMPLANT
DECANTER SPIKE VIAL GLASS SM (MISCELLANEOUS) ×2 IMPLANT
DRAPE LAPAROSCOPIC ABDOMINAL (DRAPES) ×2 IMPLANT
ELECT REM PT RETURN 9FT ADLT (ELECTROSURGICAL) ×2
ELECTRODE REM PT RTRN 9FT ADLT (ELECTROSURGICAL) ×1 IMPLANT
ENDOLOOP SUT PDS II  0 18 (SUTURE)
ENDOLOOP SUT PDS II 0 18 (SUTURE) IMPLANT
GLOVE SURG ORTHO 8.0 STRL STRW (GLOVE) ×2 IMPLANT
GOWN STRL REUS W/TWL XL LVL3 (GOWN DISPOSABLE) ×4 IMPLANT
KIT BASIN OR (CUSTOM PROCEDURE TRAY) ×2 IMPLANT
POUCH SPECIMEN RETRIEVAL 10MM (ENDOMECHANICALS) ×2 IMPLANT
RELOAD 45 VASCULAR/THIN (ENDOMECHANICALS) IMPLANT
RELOAD STAPLE 45 2.5 WHT GRN (ENDOMECHANICALS) IMPLANT
RELOAD STAPLE 45 3.5 BLU ETS (ENDOMECHANICALS) IMPLANT
RELOAD STAPLE TA45 3.5 REG BLU (ENDOMECHANICALS) ×2 IMPLANT
SET IRRIG TUBING LAPAROSCOPIC (IRRIGATION / IRRIGATOR) ×2 IMPLANT
SHEARS HARMONIC ACE PLUS 36CM (ENDOMECHANICALS) ×2 IMPLANT
STRIP CLOSURE SKIN 1/2X4 (GAUZE/BANDAGES/DRESSINGS) ×2 IMPLANT
SUT MNCRL AB 4-0 PS2 18 (SUTURE) ×2 IMPLANT
TOWEL OR 17X26 10 PK STRL BLUE (TOWEL DISPOSABLE) ×2 IMPLANT
TOWEL OR NON WOVEN STRL DISP B (DISPOSABLE) ×2 IMPLANT
TRAY FOLEY W/METER SILVER 14FR (SET/KITS/TRAYS/PACK) ×2 IMPLANT
TRAY FOLEY W/METER SILVER 16FR (SET/KITS/TRAYS/PACK) ×1 IMPLANT
TRAY LAPAROSCOPIC (CUSTOM PROCEDURE TRAY) ×2 IMPLANT
TROCAR BLADELESS OPT 5 75 (ENDOMECHANICALS) ×2 IMPLANT
TROCAR XCEL BLUNT TIP 100MML (ENDOMECHANICALS) ×2 IMPLANT
TROCAR XCEL NON-BLD 11X100MML (ENDOMECHANICALS) ×2 IMPLANT

## 2015-10-28 NOTE — Anesthesia Preprocedure Evaluation (Addendum)
Anesthesia Evaluation  Patient identified by MRN, date of birth, ID band Patient awake    Reviewed: Allergy & Precautions, NPO status , Patient's Chart, lab work & pertinent test results  Airway Mallampati: II   Neck ROM: full    Dental   Pulmonary neg pulmonary ROS,    breath sounds clear to auscultation       Cardiovascular hypertension,  Rhythm:regular Rate:Normal     Neuro/Psych    GI/Hepatic   Endo/Other  negative endocrine ROSMorbid obesity  Renal/GU negative Renal ROS     Musculoskeletal   Abdominal   Peds  Hematology   Anesthesia Other Findings   Reproductive/Obstetrics                          Anesthesia Physical Anesthesia Plan  ASA: III and emergent  Anesthesia Plan: General   Post-op Pain Management:    Induction: Intravenous, Rapid sequence and Cricoid pressure planned  Airway Management Planned: Oral ETT  Additional Equipment:   Intra-op Plan:   Post-operative Plan: Extubation in OR  Informed Consent: I have reviewed the patients History and Physical, chart, labs and discussed the procedure including the risks, benefits and alternatives for the proposed anesthesia with the patient or authorized representative who has indicated his/her understanding and acceptance.   Dental advisory given  Plan Discussed with: CRNA, Anesthesiologist and Surgeon  Anesthesia Plan Comments:        Anesthesia Quick Evaluation

## 2015-10-28 NOTE — ED Notes (Signed)
Bed: [id number] Expected date:  Expected time:  Means of arrival:  Comments: Terence Lux

## 2015-10-28 NOTE — ED Notes (Signed)
Sarah from main lab called and wanted a recollect on all blood drawn. Said that some results, Hgb 24 and Hct 70, were "off" This Probation officer told Judson Roch that I would inform the EDP and ask for his opinion before a recollect was drawn [place] [facility] informed

## 2015-10-28 NOTE — ED Notes (Signed)
Dr. Kathrynn Humble informed of blood result and talking to laboratory now

## 2015-10-28 NOTE — ED Provider Notes (Signed)
CSN: [id number]     Arrival date & time 10/28/15  1753 History   First MD Initiated Contact with Patient 10/28/15 1813     Chief Complaint  Patient presents with  . Abdominal Pain  . Nausea  . Diarrhea     (Consider location/radiation/quality/duration/timing/severity/associated sxs/prior Treatment) HPI Comments: Pt comes in with cc of abd pain. Pt has no hx of abd surgeries. Reports that she started having abd pain, LUQ this AM around 10 am. Pt then started having some loose BM and followed by nausea w/o emesis. Pt's pain is now severe, and she has pain with ambulating. No uti like sx, no hx of renal stones.   ROS 10 Systems reviewed and are negative for acute change except as noted in the HPI.     The history is provided by the patient.    Past Medical History  Diagnosis Date  . Chiari malformation 1996    decompression   . Subclavian bypass stenosis ([place]) 11/97    right  . Idiopathic intracranial hypertension   . Hypertension    Past Surgical History  Procedure Laterality Date  . Subclavian bypass graft Right 07/1996  . Ganglion cyst excision Right     wrist  . Wrist fracture surgery Right 02/2007   Family History  Problem Relation Age of Onset  . Cataracts Mother   . Diabetes Mother   . Hypertension Mother   . Diabetes Father   . Hypertension Father    Social History  Substance Use Topics  . Smoking status: Never Smoker   . Smokeless tobacco: Never Used  . Alcohol Use: No   OB History    No data available     Review of Systems    Allergies  Codeine; Lyrica; Nyquil multi-symptom; and Sulfa antibiotics  Home Medications   Prior to Admission medications   Medication Sig Start Date End Date Taking? Authorizing Provider  acetaZOLAMIDE (DIAMOX) 250 MG tablet Take 2 tablets (500 mg total) by mouth 2 (two) times daily. 08/05/15  Yes Penni Bombard, MD  albuterol (PROVENTIL HFA;VENTOLIN HFA) 108 (90 BASE) MCG/ACT inhaler Inhale 2 puffs into the lungs  every 4 (four) hours as needed for wheezing or shortness of breath. 05/04/15  Yes Jola Schmidt, MD  bismuth subsalicylate (PEPTO BISMOL) 262 MG/15ML suspension Take 30 mLs by mouth every 6 (six) hours as needed for indigestion or diarrhea or loose stools.   Yes Historical Provider, MD  Calcium Carbonate-Vitamin D (CALCIUM + D PO) Take 1 tablet by mouth daily. Calcium 600 mg, vit D3 300 mg   Yes Historical Provider, MD  cetirizine (ZYRTEC) 5 MG tablet Take 5 mg by mouth daily.   Yes Historical Provider, MD  Cyanocobalamin (VITAMIN B 12 PO) Take 5,000 mcg by mouth daily.    Yes Historical Provider, MD  Echinacea 125 MG CAPS Take 125 mg by mouth daily.    Yes Historical Provider, MD  Flax Oil-Fish Oil-Borage Oil (FISH OIL-FLAX OIL-BORAGE OIL) CAPS Take 2 capsules by mouth daily.    Yes Historical Provider, MD  lisinopril-hydrochlorothiazide (PRINZIDE,ZESTORETIC) 20-12.5 MG tablet TAKE 1 TABLET BY MOUTH ONCE DAILY 10/24/15  Yes Sharion Balloon, FNP  Magnesium 250 MG TABS Take 250 mg by mouth daily.    Yes Historical Provider, MD  Multiple Vitamin (MULTIVITAMIN) capsule Take 1 capsule by mouth daily.   Yes Historical Provider, MD  ondansetron (ZOFRAN-ODT) 4 MG disintegrating tablet Take 1 tablet (4 mg total) by mouth every 8 (eight) hours as  needed for nausea or vomiting. 05/03/15  Yes Penni Bombard, MD  Oxymetazoline HCl (NASAL SPRAY NA) Place 2 sprays into the nose as needed (CONGESTION).    Yes Historical Provider, MD   BP 114/57 mmHg  Pulse 84  Temp(Src) 98.2 F (36.8 C) (Oral)  Resp 16  Ht 5' 5.5" (1.664 m)  Wt 241 lb 8 oz (109.544 kg)  BMI 39.56 kg/m2  SpO2 100%  LMP  (Within Weeks) Physical Exam  Constitutional: She is oriented to person, place, and time. She appears well-developed and well-nourished.  HENT:  Head: Normocephalic and atraumatic.  Eyes: EOM are normal. Pupils are equal, round, and reactive to light.  Neck: Neck supple.  Cardiovascular: Normal rate, regular rhythm and  normal heart sounds.   No murmur heard. Pulmonary/Chest: Effort normal. No respiratory distress.  Abdominal: Soft. She exhibits no distension. There is tenderness. There is rebound and guarding.  LUQ and epigastric tenderness, with guarding  Neurological: She is alert and oriented to person, place, and time.  Skin: Skin is warm and dry.  Nursing note and vitals reviewed.   ED Course  Procedures (including critical care time) Labs Review Labs Reviewed  URINALYSIS, ROUTINE W REFLEX MICROSCOPIC (NOT AT [facility]) - Abnormal; Notable for the following:    Ketones, ur 40 (*)    Leukocytes, UA SMALL (*)    All other components within normal limits  CBC - Abnormal; Notable for the following:    WBC 15.0 (*)    All other components within normal limits  COMPREHENSIVE METABOLIC PANEL - Abnormal; Notable for the following:    Potassium 3.1 (*)    CO2 17 (*)    Glucose, Bld 105 (*)    All other components within normal limits  URINE MICROSCOPIC-ADD ON - Abnormal; Notable for the following:    Squamous Epithelial / LPF 0-5 (*)    Bacteria, UA FEW (*)    All other components within normal limits  I-STAT CHEM 8, ED - Abnormal; Notable for the following:    Potassium 3.1 (*)    Glucose, Bld 104 (*)    Calcium, Ion 1.27 (*)    All other components within normal limits  LIPASE, BLOOD  I-STAT BETA HCG BLOOD, ED ([facility], [facility], [facility] ONLY)  SURGICAL PATHOLOGY    Imaging Review Ct Abdomen Pelvis W Contrast  10/28/2015  CLINICAL DATA:  Left upper quadrant pain for several hours EXAM: CT ABDOMEN AND PELVIS WITH CONTRAST TECHNIQUE: Multidetector CT imaging of the abdomen and pelvis was performed using the standard protocol following bolus administration of intravenous contrast. CONTRAST:  170mL OMNIPAQUE IOHEXOL 300 MG/ML  SOLN COMPARISON:  01/30/2007 FINDINGS: The lung bases are free of acute infiltrate or sizable effusion. The liver, gallbladder, spleen, adrenal glands and pancreas are within normal limits.  The kidneys are well visualized bilaterally and demonstrate a normal enhancement pattern. No renal calculi or obstructive changes are seen. Normal excretion of contrast is noted on delayed images. The appendix is well visualized and dilated to 12.5 mm. Periappendiceal inflammatory changes are noted as well as evidence of multiple appendicoliths with one noted centrally at the junction with the cecum. These changes are consistent with acute appendicitis. The bladder is partially distended. The uterus and ovaries are within normal limits with evidence of a left ovarian cyst measuring 2.5 cm. No acute bony abnormality is noted. IMPRESSION: Changes consistent with acute appendicitis with multiple appendicoliths Left ovarian cyst. Electronically Signed   By: Linus Mako.D.  On: 10/28/2015 21:44   I have personally reviewed and evaluated these images and lab results as part of my medical decision-making.   EKG Interpretation None      MDM   Final diagnoses:  Acute appendicitis with generalized peritonitis    Pt comes in with cc of abd pain, acute, with peritoneal signs. Left sided tenderness - clinical concerns for renal stone, pancreatitis, renal/splenic infarct, pyelo, pancreatitis. CT ordered, shows pancreatitis. Surgery consulted for admission.    Varney Biles, MD 10/29/15 [phone]

## 2015-10-28 NOTE — Progress Notes (Addendum)
   Subjective:    Patient ID: [name], female    DOB: [birth year], 45 y.o.   MRN: [id number]  HPI Patient in c/o left upper pain that started about 10 this  Morning- has had 4 bowel movements that are getting looser as they go- she started getting nauseated about 4 hours ago- took a zofran which has not helped. Rate abdominal pain 8/10. Standing make pain worse and nothing really helps much with pain- pain has been constant since it started. Feels chilled but has no fever.   Review of Systems  Constitutional: Negative.   HENT: Negative.   Respiratory: Negative.   Gastrointestinal: Positive for nausea and abdominal pain.  Genitourinary: Negative.   Skin: Negative.   Neurological: Negative.   Psychiatric/Behavioral: Negative.   All other systems reviewed and are negative.      Objective:   Physical Exam  Constitutional: She is oriented to person, place, and time. She appears well-developed and well-nourished.  Cardiovascular: Normal rate and normal heart sounds.   Pulmonary/Chest: Effort normal and breath sounds normal.  Abdominal: Soft. There is tenderness (exterme abd pin in upper quadrant with light palpitation).  Decreased bowel sounds  Neurological: She is alert and oriented to person, place, and time.  Skin: Skin is warm.  Psychiatric: She has a normal mood and affect. Her behavior is normal. Judgment and thought content normal.    BP 130/73 mmHg  Pulse 77  Temp(Src) 97 F (36.1 C) (Oral)  Ht 5\' 6"  (1.676 m)  Wt 241 lb (109.317 kg)  BMI 38.92 kg/m2       Assessment & Plan:   1. Pain of upper abdomen    To ER- husband is going to drive her to [facility]-per patient request Report called Mary-Margaret Hassell Done, FNP

## 2015-10-28 NOTE — Anesthesia Procedure Notes (Signed)
Procedure Name: Intubation Date/Time: 10/28/2015 11:47 PM Performed by: Noralyn Pick D Pre-anesthesia Checklist: Patient identified, Emergency Drugs available, Suction available and Patient being monitored Patient Re-evaluated:Patient Re-evaluated prior to inductionOxygen Delivery Method: Circle System Utilized Preoxygenation: Pre-oxygenation with 100% oxygen Intubation Type: IV induction, Rapid sequence and Cricoid Pressure applied Ventilation: Mask ventilation without difficulty Laryngoscope Size: Mac and 3 Grade View: Grade II Tube type: Oral Tube size: 7.5 mm Number of attempts: 1 Airway Equipment and Method: Stylet and Oral airway Placement Confirmation: ETT inserted through vocal cords under direct vision,  positive ETCO2 and breath sounds checked- equal and bilateral Secured at: 21 cm Tube secured with: Tape Dental Injury: Teeth and Oropharynx as per pre-operative assessment

## 2015-10-28 NOTE — H&P (Signed)
[name] is an 45 y.o. female.    General Surgery [facility]  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  No emesis.  Four BM's yesterday.  WBC elevated at 15K. Low grade fever.  CT scan positive for acute appendicitis.  General Surgery called for management.  No previous abdominal surgery.  History of intracerebral HTN, essential HTN, and Chiari malformation.  Past Medical History  Diagnosis Date  . Chiari malformation 1996    decompression   . Subclavian bypass stenosis ([place]) 11/97    right  . Idiopathic intracranial hypertension   . Hypertension     Past Surgical History  Procedure Laterality Date  . Subclavian bypass graft Right 07/1996  . Ganglion cyst excision Right     wrist  . Wrist fracture surgery Right 02/2007    Family History  Problem Relation Age of Onset  . Cataracts Mother   . Diabetes Mother   . Hypertension Mother   . Diabetes Father   . Hypertension Father    Social History:  reports that she has never smoked. She has never used smokeless tobacco. She reports that she does not drink alcohol or use illicit drugs.  Allergies:  Allergies  Allergen Reactions  . Codeine Other (See Comments)    "whole body turned bright red, swelled a little" with V codiene  . Lyrica [Pregabalin] Other (See Comments)    "suicidal thoughts"  . Nyquil Multi-Symptom [Pseudoeph-Doxylamine-Dm-Apap] Other (See Comments)    Turns skin red *all over body*, pt reports getting very hot   . Sulfa Antibiotics Other (See Comments)    "body turned red"     (Not in a hospital admission)  Results for orders placed or performed during the hospital encounter of 10/28/15 (from the past 48 hour(s))  Urinalysis, Routine w reflex microscopic (not at [facility])     Status: Abnormal   Collection Time: 10/28/15  7:13 PM  Result Value  Ref Range   Color, Urine YELLOW YELLOW   APPearance CLEAR CLEAR   Specific Gravity, Urine 1.024 1.005 - 1.030   pH 5.0 5.0 - 8.0   Glucose, UA NEGATIVE NEGATIVE mg/dL   Hgb urine dipstick NEGATIVE NEGATIVE   Bilirubin Urine NEGATIVE NEGATIVE   Ketones, ur 40 (A) NEGATIVE mg/dL   Protein, ur NEGATIVE NEGATIVE mg/dL   Nitrite NEGATIVE NEGATIVE   Leukocytes, UA SMALL (A) NEGATIVE  Urine microscopic-add on     Status: Abnormal   Collection Time: 10/28/15  7:13 PM  Result Value Ref Range   Squamous Epithelial / LPF 0-5 (A) NONE SEEN   WBC, UA 0-5 0 - 5 WBC/hpf   RBC / HPF 0-5 0 - 5 RBC/hpf   Bacteria, UA FEW (A) NONE SEEN   Urine-Other MUCOUS PRESENT   I-Stat beta hCG blood, ED ([facility], [facility], [facility] only)     Status: None   Collection Time: 10/28/15  7:16 PM  Result Value Ref Range   I-stat hCG, quantitative <5.0 <5 mIU/mL   Comment 3            Comment:   GEST. AGE      CONC.  (mIU/mL)   <=1 WEEK        5 - 50     2 WEEKS       50 - 500  3 WEEKS       100 - 10,000     4 WEEKS     1,000 - 30,000        FEMALE AND NON-PREGNANT FEMALE:     LESS THAN 5 mIU/mL   CBC     Status: Abnormal   Collection Time: 10/28/15  7:31 PM  Result Value Ref Range   WBC 15.0 (H) 4.0 - 10.5 K/uL   RBC 4.37 3.87 - 5.11 MIL/uL   Hemoglobin 13.2 12.0 - 15.0 g/dL   HCT 38.8 36.0 - 46.0 %   MCV 88.8 78.0 - 100.0 fL   MCH 30.2 26.0 - 34.0 pg   MCHC 34.0 30.0 - 36.0 g/dL   RDW 13.0 11.5 - 15.5 %   Platelets 224 150 - 400 K/uL  Comprehensive metabolic panel     Status: Abnormal   Collection Time: 10/28/15  7:31 PM  Result Value Ref Range   Sodium 137 135 - 145 mmol/L   Potassium 3.1 (L) 3.5 - 5.1 mmol/L   Chloride 109 101 - 111 mmol/L   CO2 17 (L) 22 - 32 mmol/L   Glucose, Bld 105 (H) 65 - 99 mg/dL   BUN 15 6 - 20 mg/dL   Creatinine, Ser 0.83 0.44 - 1.00 mg/dL   Calcium 9.5 8.9 - 10.3 mg/dL   Total Protein 7.3 6.5 - 8.1 g/dL   Albumin 4.2 3.5 - 5.0 g/dL   AST 16 15 - 41 U/L   ALT 19 14 - 54 U/L    Alkaline Phosphatase 39 38 - 126 U/L   Total Bilirubin 1.0 0.3 - 1.2 mg/dL   GFR calc non Af Amer >60 >60 mL/min   GFR calc Af Amer >60 >60 mL/min    Comment: (NOTE) The eGFR has been calculated using the CKD EPI equation. This calculation has not been validated in all clinical situations. eGFR's persistently <60 mL/min signify possible Chronic Kidney Disease.    Anion gap 11 5 - 15  Lipase, blood     Status: None   Collection Time: 10/28/15  7:31 PM  Result Value Ref Range   Lipase 28 11 - 51 U/L  I-stat chem 8, ed     Status: Abnormal   Collection Time: 10/28/15  8:43 PM  Result Value Ref Range   Sodium 140 135 - 145 mmol/L   Potassium 3.1 (L) 3.5 - 5.1 mmol/L   Chloride 106 101 - 111 mmol/L   BUN 13 6 - 20 mg/dL   Creatinine, Ser 0.70 0.44 - 1.00 mg/dL   Glucose, Bld 104 (H) 65 - 99 mg/dL   Calcium, Ion 1.27 (H) 1.12 - 1.23 mmol/L   TCO2 17 0 - 100 mmol/L   Hemoglobin 15.0 12.0 - 15.0 g/dL   HCT 44.0 36.0 - 46.0 %   Ct Abdomen Pelvis W Contrast  10/28/2015  CLINICAL DATA:  Left upper quadrant pain for several hours EXAM: CT ABDOMEN AND PELVIS WITH CONTRAST TECHNIQUE: Multidetector CT imaging of the abdomen and pelvis was performed using the standard protocol following bolus administration of intravenous contrast. CONTRAST:  150m OMNIPAQUE IOHEXOL 300 MG/ML  SOLN COMPARISON:  01/30/2007 FINDINGS: The lung bases are free of acute infiltrate or sizable effusion. The liver, gallbladder, spleen, adrenal glands and pancreas are within normal limits. The kidneys are well visualized bilaterally and demonstrate a normal enhancement pattern. No renal calculi or obstructive changes are seen. Normal excretion of contrast is noted on delayed images. The appendix is  well visualized and dilated to 12.5 mm. Periappendiceal inflammatory changes are noted as well as evidence of multiple appendicoliths with one noted centrally at the junction with the cecum. These changes are consistent with acute  appendicitis. The bladder is partially distended. The uterus and ovaries are within normal limits with evidence of a left ovarian cyst measuring 2.5 cm. No acute bony abnormality is noted. IMPRESSION: Changes consistent with acute appendicitis with multiple appendicoliths Left ovarian cyst. Electronically Signed   By: Inez Catalina M.D.   On: 10/28/2015 21:44    Review of Systems  Constitutional: Positive for fever and chills.  HENT: Negative.   Eyes: Negative.   Respiratory: Negative.   Cardiovascular: Negative.   Gastrointestinal: Positive for nausea, abdominal pain and diarrhea. Negative for vomiting.  Genitourinary: Negative.   Musculoskeletal: Negative.   Skin: Negative.   Neurological: Negative.   Endo/Heme/Allergies: Negative.   Psychiatric/Behavioral: Negative.     Blood pressure 128/70, pulse 91, temperature 99.1 F (37.3 C), temperature source Oral, resp. rate 16, height 5' 5.5" (1.664 m), weight 109.544 kg (241 lb 8 oz), SpO2 96 %. Physical Exam  Constitutional: She is oriented to person, place, and time. She appears well-developed and well-nourished. No distress.  HENT:  Head: Normocephalic and atraumatic.  Right Ear: External ear normal.  Left Ear: External ear normal.  Eyes: Conjunctivae are normal. Pupils are equal, round, and reactive to light. No scleral icterus.  Neck: Normal range of motion. Neck supple. No tracheal deviation present. No thyromegaly present.  Well healed incision right subclavian  Cardiovascular: Normal rate and regular rhythm.   Respiratory: Effort normal and breath sounds normal. No respiratory distress. She has no wheezes.  GI: Soft. Bowel sounds are normal. She exhibits no distension and no mass. There is tenderness (epigastrium and LUQ). There is no rebound and no guarding.  Musculoskeletal: Normal range of motion. She exhibits no edema.  Neurological: She is alert and oriented to person, place, and time.  Skin: Skin is warm and dry. She is  not diaphoretic.  Psychiatric: She has a normal mood and affect. Her behavior is normal.     Assessment/Plan Acute appendicitis  IV abx started by ER physician  Admit to General Surgery  Plan lap appendectomy this evening  The risks and benefits of the procedure have been discussed at length with the patient, husband, and mother.  The patient understands the proposed procedure, potential alternative treatments, and the course of recovery to be expected.  All of the patient's questions have been answered at this time.  The patient wishes to proceed with surgery.  Earnstine Regal, MD, [facility] Office: [place], [place] 10/28/2015, 11:00 PM

## 2015-10-28 NOTE — ED Notes (Signed)
Pt presents to the ED with c/o lower abdominal pain across the lower portion of abdomen since 10 am this morning.  Pt has had 4 episodes of loose stools and nausea with intermittent pain that ranges from 5 to 8 out of 10.

## 2015-10-28 NOTE — ED Notes (Signed)
EDP Nanavati spoke with main lab, informed of results EDP gave "ok" to recollect all labs RN Juliane Lack at bedside to obtain labs

## 2015-10-29 DIAGNOSIS — K3580 Unspecified acute appendicitis: Secondary | ICD-10-CM | POA: Diagnosis present

## 2015-10-29 DIAGNOSIS — K353 Acute appendicitis with localized peritonitis: Secondary | ICD-10-CM | POA: Diagnosis not present

## 2015-10-29 DIAGNOSIS — I10 Essential (primary) hypertension: Secondary | ICD-10-CM | POA: Diagnosis not present

## 2015-10-29 DIAGNOSIS — Z6839 Body mass index (BMI) 39.0-39.9, adult: Secondary | ICD-10-CM | POA: Diagnosis not present

## 2015-10-29 DIAGNOSIS — Z87728 Personal history of other specified (corrected) congenital malformations of nervous system and sense organs: Secondary | ICD-10-CM | POA: Diagnosis not present

## 2015-10-29 DIAGNOSIS — Z79899 Other long term (current) drug therapy: Secondary | ICD-10-CM | POA: Diagnosis not present

## 2015-10-29 MED ORDER — IBUPROFEN 200 MG PO TABS
600.0000 mg | ORAL_TABLET | Freq: Four times a day (QID) | ORAL | Status: DC | PRN
  Administered 2015-10-29: 600 mg via ORAL
  Filled 2015-10-29: qty 3

## 2015-10-29 MED ORDER — HYDROCHLOROTHIAZIDE 12.5 MG PO CAPS
12.5000 mg | ORAL_CAPSULE | Freq: Every day | ORAL | Status: DC
  Filled 2015-10-29: qty 1

## 2015-10-29 MED ORDER — EPHEDRINE SULFATE 50 MG/ML IJ SOLN
INTRAMUSCULAR | Status: DC | PRN
  Administered 2015-10-29 (×2): 5 mg via INTRAVENOUS

## 2015-10-29 MED ORDER — ONDANSETRON HCL 4 MG/2ML IJ SOLN
INTRAMUSCULAR | Status: DC | PRN
  Administered 2015-10-29: 4 mg via INTRAVENOUS

## 2015-10-29 MED ORDER — LISINOPRIL-HYDROCHLOROTHIAZIDE 20-12.5 MG PO TABS: 1.0000 | ORAL_TABLET | Freq: Every day | ORAL | Status: DC

## 2015-10-29 MED ORDER — BUPIVACAINE-EPINEPHRINE 0.25% -1:200000 IJ SOLN
INTRAMUSCULAR | Status: DC | PRN
  Administered 2015-10-29: 20 mL

## 2015-10-29 MED ORDER — OXYCODONE HCL 5 MG/5ML PO SOLN
5.0000 mg | Freq: Once | ORAL | Status: DC | PRN
  Filled 2015-10-29: qty 5

## 2015-10-29 MED ORDER — DEXAMETHASONE SODIUM PHOSPHATE 10 MG/ML IJ SOLN
INTRAMUSCULAR | Status: DC | PRN
  Administered 2015-10-29: 10 mg via INTRAVENOUS

## 2015-10-29 MED ORDER — METRONIDAZOLE IN NACL 5-0.79 MG/ML-% IV SOLN
500.0000 mg | Freq: Three times a day (TID) | INTRAVENOUS | Status: DC
  Administered 2015-10-29: 500 mg via INTRAVENOUS
  Filled 2015-10-29 (×2): qty 100

## 2015-10-29 MED ORDER — KCL IN DEXTROSE-NACL 20-5-0.45 MEQ/L-%-% IV SOLN
INTRAVENOUS | Status: DC
  Administered 2015-10-29: 1000 mL via INTRAVENOUS
  Filled 2015-10-29: qty 1000

## 2015-10-29 MED ORDER — PHENYLEPHRINE HCL 10 MG/ML IJ SOLN
INTRAMUSCULAR | Status: AC
  Filled 2015-10-29: qty 1

## 2015-10-29 MED ORDER — HYDROMORPHONE HCL 1 MG/ML IJ SOLN: 1.0000 mg | INTRAMUSCULAR | Status: DC | PRN

## 2015-10-29 MED ORDER — DEXAMETHASONE SODIUM PHOSPHATE 10 MG/ML IJ SOLN
INTRAMUSCULAR | Status: AC
  Filled 2015-10-29: qty 1

## 2015-10-29 MED ORDER — OXYCODONE HCL 5 MG PO TABS: 5.0000 mg | ORAL_TABLET | ORAL | Status: DC | PRN

## 2015-10-29 MED ORDER — HYDROMORPHONE HCL 1 MG/ML IJ SOLN
INTRAMUSCULAR | Status: AC
  Filled 2015-10-29: qty 1

## 2015-10-29 MED ORDER — LISINOPRIL 20 MG PO TABS
20.0000 mg | ORAL_TABLET | Freq: Every day | ORAL | Status: DC
  Filled 2015-10-29: qty 1

## 2015-10-29 MED ORDER — SODIUM CHLORIDE 0.9 % IV SOLN
INTRAVENOUS | Status: DC | PRN
  Administered 2015-10-28 – 2015-10-29 (×2): via INTRAVENOUS

## 2015-10-29 MED ORDER — SUGAMMADEX SODIUM 200 MG/2ML IV SOLN
INTRAVENOUS | Status: AC
Start: 2015-10-29 — End: 2015-10-29
  Filled 2015-10-29: qty 2

## 2015-10-29 MED ORDER — SCOPOLAMINE 1 MG/3DAYS TD PT72
MEDICATED_PATCH | TRANSDERMAL | Status: DC | PRN
  Administered 2015-10-28: 1 via TRANSDERMAL

## 2015-10-29 MED ORDER — HYDROMORPHONE HCL 1 MG/ML IJ SOLN
0.2500 mg | INTRAMUSCULAR | Status: DC | PRN
  Administered 2015-10-29 (×2): 0.5 mg via INTRAVENOUS

## 2015-10-29 MED ORDER — LACTATED RINGERS IV SOLN
INTRAVENOUS | Status: DC | PRN
  Administered 2015-10-29: 1000 mL

## 2015-10-29 MED ORDER — ALBUTEROL SULFATE (2.5 MG/3ML) 0.083% IN NEBU: 2.5000 mg | INHALATION_SOLUTION | RESPIRATORY_TRACT | Status: DC | PRN

## 2015-10-29 MED ORDER — SUGAMMADEX SODIUM 200 MG/2ML IV SOLN
INTRAVENOUS | Status: DC | PRN
  Administered 2015-10-29: 200 mg via INTRAVENOUS

## 2015-10-29 MED ORDER — ACETAZOLAMIDE 250 MG PO TABS
500.0000 mg | ORAL_TABLET | Freq: Two times a day (BID) | ORAL | Status: DC
  Administered 2015-10-29: 500 mg via ORAL
  Filled 2015-10-29 (×2): qty 2

## 2015-10-29 MED ORDER — ONDANSETRON HCL 4 MG/2ML IJ SOLN: 4.0000 mg | Freq: Four times a day (QID) | INTRAMUSCULAR | Status: DC | PRN

## 2015-10-29 MED ORDER — OXYCODONE HCL 5 MG PO TABS
5.0000 mg | ORAL_TABLET | ORAL | Status: DC | PRN
  Administered 2015-10-29 (×2): 5 mg via ORAL
  Filled 2015-10-29 (×2): qty 1

## 2015-10-29 MED ORDER — SODIUM CHLORIDE 0.9 % IV SOLN: INTRAVENOUS | Status: DC

## 2015-10-29 MED ORDER — DEXTROSE 5 % IV SOLN: 2.0000 g | INTRAVENOUS | Status: DC

## 2015-10-29 MED ORDER — ONDANSETRON HCL 4 MG/2ML IJ SOLN
INTRAMUSCULAR | Status: AC
Start: 2015-10-29 — End: 2015-10-29
  Filled 2015-10-29: qty 2

## 2015-10-29 MED ORDER — ONDANSETRON 4 MG PO TBDP
4.0000 mg | ORAL_TABLET | Freq: Four times a day (QID) | ORAL | Status: DC | PRN
  Administered 2015-10-29: 4 mg via ORAL
  Filled 2015-10-29: qty 1

## 2015-10-29 MED ORDER — OXYCODONE HCL 5 MG PO TABS: 5.0000 mg | ORAL_TABLET | Freq: Once | ORAL | Status: DC | PRN

## 2015-10-29 MED ORDER — 0.9 % SODIUM CHLORIDE (POUR BTL) OPTIME
TOPICAL | Status: DC | PRN
  Administered 2015-10-29: 1000 mL

## 2015-10-29 NOTE — Op Note (Signed)
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 No emesis. Four BM's yesterday. WBC elevated at 15K. Low grade fever. CT scan positive for acute appendicitis. General Surgery called for management.  Procedure:  Patient is brought to the operating room and placed in a supine position on the operating room table. Following administration of general anesthesia, a time out was held and the patient's name and procedure is confirmed. Patient is then prepped and draped in the usual strict aseptic fashion.  After ascertaining that an adequate level of anesthesia has been achieved, a peri-umbilical incision is made with a #15 blade. Dissection is carried down to the fascia. Fascia is incised in the midline and the peritoneal cavity is entered cautiously. A #0-vicryl pursestring suture is placed in the fascia. An Hassan cannula is introduced under direct vision and secured with the pursestring suture. The abdomen is insufflated with carbon dioxide. The laparoscope is introduced and the abdomen is explored. Operative ports are placed in the right upper quadrant and left lower quadrant. The appendix is identified. The mesoappendix is divided with the harmonic scalpel. Dissection is carried down to the base of the appendix. The base of the appendix is dissected out clearing the junction with the cecal wall. Using an Endo-GIA stapler, the base of the appendix is transected at the junction with the cecal wall. There is good approximation of tissue along the staple line. There is good hemostasis along the staple line. The appendix is  placed into an endo-catch bag and withdrawn through the umbilical port. The #0-vicryl pursestring suture is tied securely.  Right lower quadrant is irrigated with warm saline which is evacuated. Good hemostasis is noted. Ports are removed under direct vision. Good hemostasis is noted at the port sites. Pneumoperitoneum is released.  Skin incisions are anesthetized with local anesthetic. Wounds are closed with interrupted 4-0 Monocryl subcuticular sutures. Wounds are washed and dried and benzoin and Steri-Strips are applied. Dressings are applied. The patient is awakened from anesthesia and brought to the recovery room. The patient tolerated the procedure well.  Earnstine Regal, MD, [facility] Office: [phone]

## 2015-10-29 NOTE — Discharge Summary (Signed)
Physician Discharge Summary [facility]  Patient ID: [name] MRN: [id number] DOB/AGE: 1971-06-23 45 y.o.  Admit date: 10/28/2015 Discharge date: 10/29/2015  Admission Diagnoses:  Acute appendicitis  Discharge Diagnoses:  Principal Problem:   Appendicitis, acute Active Problems:   Acute appendicitis   Discharged Condition: good  Hospital Course: Patient was admitted for observation following laparoscopic surgery.  Post op course was uncomplicated.  Pain was well controlled.  Tolerated diet.  Patient was prepared for discharge home on POD#1.  Consults: None  Treatments: surgery: lap appendectomy  Discharge Exam: Blood pressure 92/51, pulse 72, temperature 97.7 F (36.5 C), temperature source Oral, resp. rate 16, height 5\' 5"  (1.651 m), weight 115.2 kg (253 lb 15.5 oz), SpO2 99 %. See progress note.   Disposition: Home  Discharge Instructions    Diet - low sodium heart healthy    Complete by:  As directed      Discharge instructions    Complete by:  As directed   [place] [place]  LAPAROSCOPIC SURGERY:  POST-OP INSTRUCTIONS  Always review your discharge instruction sheet given to you by the facility where your surgery was performed.  A prescription for pain medication may be given to you upon discharge.  Take your pain medication as prescribed.  If narcotic pain medicine is not needed, then you may take acetaminophen (Tylenol) or ibuprofen (Advil) as needed.  Take your usually prescribed medications unless otherwise directed.  If you need a refill on your pain medication, please contact your pharmacy.  They will contact our office to request authorization. Prescriptions will not be filled after 5 P.M. or on weekends.  You should follow a light diet the first few days after arrival home, such as soup and crackers or toast.  Be sure to include plenty of fluids daily.  Most patients will experience some swelling and bruising in  the area of the incisions.  Ice packs will help.  Swelling and bruising can take several days to resolve.   It is common to experience some constipation after surgery.  Increasing fluid intake and taking a stool softener (such as Colace) will usually help or prevent this problem from occurring.  A mild laxative (Milk of Magnesia or Miralax) should be taken according to package instructions if there has been no bowel movement after 48 hours.  You will have steri-strips and a gauze dressing over your incisions.  You may remove the gauze bandage on the second day after surgery, and you may shower at that time.  Leave your steri-strips (small skin tapes) in place directly over the incision.  These strips should remain on the skin for 5-7 days and then be removed.  You may get them wet in the shower and pat them dry.  Any sutures or staples will be removed at the office during your follow-up visit.  ACTIVITIES:  You may resume regular (light) daily activities beginning the next day - such as daily self-care, walking, climbing stairs - gradually increasing activities as tolerated.  You may have sexual intercourse when it is comfortable.  Refrain from any heavy lifting or straining until approved by your doctor.  You may drive when you are no longer taking prescription pain medication, you can comfortably wear a seatbelt, and you can safely maneuver your car and apply brakes.  You should see your doctor in the office for a follow-up appointment approximately 2-3 weeks after your surgery.  Make sure that you call for this appointment within  a day or two after you arrive home to insure a convenient appointment time.  WHEN TO CALL YOUR DOCTOR: Fever over 101.0 Inability to urinate Continued bleeding from incision Increased pain, redness, or drainage from the incision Increasing abdominal pain  The clinic staff is available to answer your questions during regular business hours.  Please don't hesitate to  call and ask to speak to one of the nurses for clinical concerns.  If you have a medical emergency, go to the nearest emergency room or call 911.  A surgeon from [facility] is always on call for the hospital.  Earnstine Regal, MD, [facility] Office: [place] Free:  [place] [phone]  Website: [email or web address]     Increase activity slowly    Complete by:  As directed      Remove dressing in 24 hours    Complete by:  As directed             Medication List    TAKE these medications        acetaZOLAMIDE 250 MG tablet  Commonly known as:  DIAMOX  Take 2 tablets (500 mg total) by mouth 2 (two) times daily.     albuterol 108 (90 Base) MCG/ACT inhaler  Commonly known as:  PROVENTIL HFA;VENTOLIN HFA  Inhale 2 puffs into the lungs every 4 (four) hours as needed for wheezing or shortness of breath.     bismuth subsalicylate 99991111 [id number] suspension  Commonly known as:  PEPTO BISMOL  Take 30 mLs by mouth every 6 (six) hours as needed for indigestion or diarrhea or loose stools.     CALCIUM + D PO  Take 1 tablet by mouth daily. Calcium 600 mg, vit D3 300 mg     cetirizine 5 MG tablet  Commonly known as:  ZYRTEC  Take 5 mg by mouth daily.     Echinacea 125 MG Caps  Take 125 mg by mouth daily.     Fish Oil-Flax Oil-Borage Oil Caps  Take 2 capsules by mouth daily.     lisinopril-hydrochlorothiazide 20-12.5 MG tablet  Commonly known as:  PRINZIDE,ZESTORETIC  TAKE 1 TABLET BY MOUTH ONCE DAILY     Magnesium 250 MG Tabs  Take 250 mg by mouth daily.     multivitamin capsule  Take 1 capsule by mouth daily.     NASAL SPRAY NA  Place 2 sprays into the nose as needed (CONGESTION).     ondansetron 4 MG disintegrating tablet  Commonly known as:  ZOFRAN-ODT  Take 1 tablet (4 mg total) by mouth every 8 (eight) hours as needed for nausea or vomiting.     oxyCODONE 5 MG immediate release tablet  Commonly  known as:  Oxy IR/ROXICODONE  Take 1-2 tablets (5-10 mg total) by mouth every 4 (four) hours as needed for moderate pain.     VITAMIN B 12 PO  Take 5,000 mcg by mouth daily.           Follow-up Information    Follow up with [facility] Schedule an appointment as soon as possible for a visit in 3 weeks.   Specialty:  General Surgery   Why:  For wound re-check   Contact information:   [address] [place]      Earnstine Regal, MD, [facility] Office: [phone]   Signed: Earnstine Regal 10/29/2015, 9:00 AM

## 2015-10-29 NOTE — Transfer of Care (Signed)
Immediate Anesthesia Transfer of Care Note  Patient: [name]  Procedure(s) Performed: Procedure(s): APPENDECTOMY LAPAROSCOPIC (N/A)  Patient Location: PACU  Anesthesia Type:General  Level of Consciousness: awake, alert  and oriented  Airway & Oxygen Therapy: Patient Spontanous Breathing and Patient connected to face mask oxygen  Post-op Assessment: Report given to RN and Post -op Vital signs reviewed and stable  Post vital signs: Reviewed and stable  Last Vitals:  Filed Vitals:   10/28/15 2200 10/28/15 2230  BP: 126/60 128/70  Pulse: 86 91  Temp:    Resp: 17 16    Complications: No apparent anesthesia complications

## 2015-10-29 NOTE — Anesthesia Postprocedure Evaluation (Signed)
Anesthesia Post Note  Patient: [name]  Procedure(s) Performed: Procedure(s) (LRB): APPENDECTOMY LAPAROSCOPIC (N/A)  Patient location during evaluation: PACU Anesthesia Type: General Level of consciousness: awake and alert and patient cooperative Pain management: pain level controlled Vital Signs Assessment: post-procedure vital signs reviewed and stable Respiratory status: spontaneous breathing and respiratory function stable Cardiovascular status: stable Anesthetic complications: no    Last Vitals:  Filed Vitals:   10/29/15 0544 10/29/15 1041  BP: 92/51 99/50  Pulse: 72 73  Temp: 36.5 C 37.1 C  Resp: 16 14    Last Pain:  Filed Vitals:   10/29/15 1157  PainSc: [place]

## 2015-10-29 NOTE — Progress Notes (Signed)
Patient ID: [name], female   DOB: [birth year], 45 y.o.   MRN: [id number]  [facility]  POD#: 0  Subjective: Patient in bed on [address].  Comfortable.  No nausea or emesis.  Ordered breakfast.  Objective: Vital signs in last 24 hours: Temp:  [97 F (36.1 C)-99.1 F (37.3 C)] 97.7 F (36.5 C) (02/25 0544) Pulse Rate:  [68-91] 72 (02/25 0544) Resp:  [12-21] 16 (02/25 0544) BP: (92-142)/(46-77) 92/51 mmHg (02/25 0544) SpO2:  [96 %-100 %] 99 % (02/25 0544) Weight:  [109.317 kg (241 lb)-115.2 kg (253 lb 15.5 oz)] 115.2 kg (253 lb 15.5 oz) (02/25 0214) Last BM Date: 10/28/15  Intake/Output from previous day: 02/24 0701 - 02/25 0700 In: 2395.8 [P.O.:240; I.V.:2155.8] Out: 200 [Urine:200] Intake/Output this shift:    Physical Exam: HEENT - sclerae clear, mucous membranes moist Neck - soft Chest - clear bilaterally Cor - RRR Abdomen - soft without distension; dressings dry and intact Ext - no edema, non-tender Neuro - alert & oriented, no focal deficits  Lab Results:   Recent Labs  10/28/15 1931 10/28/15 2043  WBC 15.0*  --   HGB 13.2 15.0  HCT 38.8 44.0  PLT 224  --    BMET  Recent Labs  10/28/15 1931 10/28/15 2043  NA 137 140  K 3.1* 3.1*  CL 109 106  CO2 17*  --   GLUCOSE 105* 104*  BUN 15 13  CREATININE 0.83 0.70  CALCIUM 9.5  --    PT/INR No results for input(s): LABPROT, INR in the last 72 hours. Comprehensive Metabolic Panel:    Component Value Date/Time   NA 140 10/28/2015 2043   NA 137 10/28/2015 1931   NA 141 06/23/2015 1310   NA 136 03/15/2015 0924   K 3.1* 10/28/2015 2043   K 3.1* 10/28/2015 1931   CL 106 10/28/2015 2043   CL 109 10/28/2015 1931   CO2 17* 10/28/2015 1931   CO2 21 06/23/2015 1310   BUN 13 10/28/2015 2043   BUN 15 10/28/2015 1931   BUN 13 06/23/2015 1310   BUN 15 03/15/2015 0924   CREATININE 0.70 10/28/2015 2043   CREATININE 0.83 10/28/2015 1931   GLUCOSE 104* 10/28/2015  2043   GLUCOSE 105* 10/28/2015 1931   GLUCOSE 92 06/23/2015 1310   GLUCOSE 84 03/15/2015 0924   CALCIUM 9.5 10/28/2015 1931   CALCIUM 9.5 06/23/2015 1310   AST 16 10/28/2015 1931   AST 17 06/23/2015 1310   ALT 19 10/28/2015 1931   ALT 19 06/23/2015 1310   ALKPHOS 39 10/28/2015 1931   ALKPHOS 48 06/23/2015 1310   BILITOT 1.0 10/28/2015 1931   BILITOT 0.5 06/23/2015 1310   BILITOT 0.4 03/15/2015 0924   PROT 7.3 10/28/2015 1931   PROT 6.9 06/23/2015 1310   PROT 6.8 03/15/2015 0924   ALBUMIN 4.2 10/28/2015 1931   ALBUMIN 4.4 06/23/2015 1310   ALBUMIN 4.4 03/15/2015 0924    Studies/Results: Ct Abdomen Pelvis W Contrast  10/28/2015  CLINICAL DATA:  Left upper quadrant pain for several hours EXAM: CT ABDOMEN AND PELVIS WITH CONTRAST TECHNIQUE: Multidetector CT imaging of the abdomen and pelvis was performed using the standard protocol following bolus administration of intravenous contrast. CONTRAST:  168mL OMNIPAQUE IOHEXOL 300 MG/ML  SOLN COMPARISON:  01/30/2007 FINDINGS: The lung bases are free of acute infiltrate or sizable effusion. The liver, gallbladder, spleen, adrenal glands and pancreas are within normal limits. The kidneys are well visualized bilaterally  and demonstrate a normal enhancement pattern. No renal calculi or obstructive changes are seen. Normal excretion of contrast is noted on delayed images. The appendix is well visualized and dilated to 12.5 mm. Periappendiceal inflammatory changes are noted as well as evidence of multiple appendicoliths with one noted centrally at the junction with the cecum. These changes are consistent with acute appendicitis. The bladder is partially distended. The uterus and ovaries are within normal limits with evidence of a left ovarian cyst measuring 2.5 cm. No acute bony abnormality is noted. IMPRESSION: Changes consistent with acute appendicitis with multiple appendicoliths Left ovarian cyst. Electronically Signed   By: Inez Catalina M.D.   On:  10/28/2015 21:44    Assessment & Plans: Status post lap appendectomy  Advance diet  Home later today  No further abx required  Follow up at [place] office to be arranged  Earnstine Regal, MD, [facility] Office: [place] 10/29/2015

## 2015-10-31 ENCOUNTER — Encounter (HOSPITAL_COMMUNITY): Payer: Self-pay | Admitting: Surgery

## 2015-11-14 MED FILL — acetaZOLAMIDE 250 MG TABS: 250 | 30 days supply | Qty: 120 | Fill #5

## 2015-12-14 MED FILL — acetaZOLAMIDE 250 MG TABS: 250 | 30 days supply | Qty: 120 | Fill #6

## 2015-12-22 ENCOUNTER — Encounter: Payer: Self-pay | Admitting: Family

## 2015-12-22 ENCOUNTER — Ambulatory Visit (INDEPENDENT_AMBULATORY_CARE_PROVIDER_SITE_OTHER): Payer: 59 | Admitting: Family

## 2015-12-22 VITALS — BP 114/79 | HR 78 | Temp 98.0°F | Ht 66.0 in | Wt 242.0 lb

## 2015-12-22 DIAGNOSIS — G932 Benign intracranial hypertension: Secondary | ICD-10-CM | POA: Diagnosis not present

## 2015-12-22 DIAGNOSIS — E7849 Other hyperlipidemia: Secondary | ICD-10-CM | POA: Insufficient documentation

## 2015-12-22 DIAGNOSIS — E785 Hyperlipidemia, unspecified: Secondary | ICD-10-CM

## 2015-12-22 DIAGNOSIS — J309 Allergic rhinitis, unspecified: Secondary | ICD-10-CM | POA: Diagnosis not present

## 2015-12-22 DIAGNOSIS — I10 Essential (primary) hypertension: Secondary | ICD-10-CM | POA: Diagnosis not present

## 2015-12-22 LAB — CMP14+EGFR
A/G RATIO: 1.6 (ref 1.2–2.2)
ALBUMIN: 4.4 g/dL (ref 3.5–5.5)
ALT: 12 IU/L (ref 0–32)
AST: 13 IU/L (ref 0–40)
Alkaline Phosphatase: 41 IU/L (ref 39–117)
BUN / CREAT RATIO: 20 (ref 9–23)
BUN: 19 mg/dL (ref 6–24)
Bilirubin Total: 0.4 mg/dL (ref 0.0–1.2)
CALCIUM: 9.9 mg/dL (ref 8.7–10.2)
CO2: 17 mmol/L — AB (ref 18–29)
CREATININE: 0.95 mg/dL (ref 0.57–1.00)
Chloride: 100 mmol/L (ref 96–106)
GFR calc Af Amer: 85 mL/min/{1.73_m2} (ref >59)
GFR, EST NON AFRICAN AMERICAN: 74 mL/min/{1.73_m2} (ref >59)
GLOBULIN, TOTAL: 2.7 g/dL (ref 1.5–4.5)
Glucose: 86 mg/dL (ref 65–99)
POTASSIUM: 3.8 mmol/L (ref 3.5–5.2)
SODIUM: 136 mmol/L (ref 134–144)
Total Protein: 7.1 g/dL (ref 6.0–8.5)

## 2015-12-22 MED ORDER — LISINOPRIL-HYDROCHLOROTHIAZIDE 20-12.5 MG PO TABS: 1.0000 | ORAL_TABLET | Freq: Every day | ORAL | Status: DC

## 2015-12-22 NOTE — Progress Notes (Signed)
Subjective:    Patient ID: [name], female    DOB: 06/26/1971, 45 y.o.   MRN: [id number]  Pt presents to the office today for chronic follow up. Pt has idiopathic intracranial hypertension and is followed by Dr. Leta Baptist, neurologists every 3 months.   Hypertension This is a chronic problem. The current episode started more than 1 year ago. The problem has been resolved since onset. The problem is controlled. Pertinent negatives include no anxiety, chest pain, headaches, malaise/fatigue, palpitations, peripheral edema or shortness of breath. Risk factors for coronary artery disease include dyslipidemia, obesity, post-menopausal state, sedentary lifestyle and family history. Past treatments include ACE inhibitors and diuretics. The current treatment provides moderate improvement. There is no history of kidney disease, CAD/MI, CVA, heart failure or a thyroid problem. There is no history of sleep apnea.  Hyperlipidemia This is a chronic problem. The current episode started more than 1 month ago. The problem is uncontrolled. Recent lipid tests were reviewed and are high. She has no history of diabetes. Pertinent negatives include no chest pain, leg pain or shortness of breath. Current antihyperlipidemic treatment includes diet change. The current treatment provides mild improvement of lipids. Risk factors for coronary artery disease include dyslipidemia, hypertension, obesity, a sedentary lifestyle, family history and post-menopausal.      Review of Systems  Constitutional: Negative.  Negative for malaise/fatigue.  HENT: Negative.   Eyes: Negative.   Respiratory: Negative.  Negative for shortness of breath.   Cardiovascular: Negative.  Negative for chest pain and palpitations.  Gastrointestinal: Negative.   Endocrine: Negative.   Genitourinary: Negative.   Musculoskeletal: Negative.   Neurological: Negative.  Negative for headaches.  Hematological: Negative.   Psychiatric/Behavioral:  Negative.   All other systems reviewed and are negative.      Objective:   Physical Exam  Constitutional: She is oriented to person, place, and time. She appears well-developed and well-nourished. No distress.  HENT:  Head: Normocephalic and atraumatic.  Right Ear: External ear normal.  Mouth/Throat: Oropharynx is clear and moist.  Eyes: Pupils are equal, round, and reactive to light.  Neck: Normal range of motion. Neck supple. No thyromegaly present.  Cardiovascular: Normal rate, regular rhythm, normal heart sounds and intact distal pulses.   No murmur heard. Pulmonary/Chest: Effort normal and breath sounds normal. No respiratory distress. She has no wheezes.  Abdominal: Soft. Bowel sounds are normal. She exhibits no distension. There is no tenderness.  Musculoskeletal: Normal range of motion. She exhibits no edema or tenderness.  Neurological: She is alert and oriented to person, place, and time. She has normal reflexes. No cranial nerve deficit.  Skin: Skin is warm and dry.  Psychiatric: She has a normal mood and affect. Her behavior is normal. Judgment and thought content normal.  Vitals reviewed.     BP 114/79 mmHg  Pulse 78  Temp(Src) 98 F (36.7 C) (Oral)  Ht _0  (1.676 m)  Wt 242 lb (109.77 kg)  BMI 39.08 kg/m2     Assessment & Plan:  1. Idiopathic intracranial hypertension - CMP14+EGFR - lisinopril-hydrochlorothiazide (PRINZIDE,ZESTORETIC) 20-12.5 MG tablet; Take 1 tablet by mouth daily.  Dispense: 90 tablet; Refill: 1  2. Essential hypertension - CMP14+EGFR - lisinopril-hydrochlorothiazide (PRINZIDE,ZESTORETIC) 20-12.5 MG tablet; Take 1 tablet by mouth daily.  Dispense: 90 tablet; Refill: 1  3. Allergic rhinitis, unspecified allergic rhinitis type - CMP14+EGFR  4. Hyperlipidemia   Continue all meds Labs pending Health Maintenance reviewed Diet and exercise encouraged RTO 6 months  [name],  FNP    

## 2015-12-22 NOTE — Patient Instructions (Signed)
Health Maintenance, Female Adopting a healthy lifestyle and getting preventive care can go a long way to promote health and wellness. Talk with your health care provider about what schedule of regular examinations is right for you. This is a good chance for you to check in with your provider about disease prevention and staying healthy. In between checkups, there are plenty of things you can do on your own. Experts have done a lot of research about which lifestyle changes and preventive measures are most likely to keep you healthy. Ask your health care provider for more information. WEIGHT AND DIET  Eat a healthy diet  Be sure to include plenty of vegetables, fruits, low-fat dairy products, and lean protein.  Do not eat a lot of foods high in solid fats, added sugars, or salt.  Get regular exercise. This is one of the most important things you can do for your health.  Most adults should exercise for at least 150 minutes each week. The exercise should increase your heart rate and make you sweat (moderate-intensity exercise).  Most adults should also do strengthening exercises at least twice a week. This is in addition to the moderate-intensity exercise.  Maintain a healthy weight  Body mass index (BMI) is a measurement that can be used to identify possible weight problems. It estimates body fat based on height and weight. Your health care provider can help determine your BMI and help you achieve or maintain a healthy weight.  For females 20 years of age and older:   A BMI below 18.5 is considered underweight.  A BMI of 18.5 to 24.9 is normal.  A BMI of 25 to 29.9 is considered overweight.  A BMI of 30 and above is considered obese.  Watch levels of cholesterol and blood lipids  You should start having your blood tested for lipids and cholesterol at 43 years of age, then have this test every 5 years.  You may need to have your cholesterol levels checked more often if:  Your lipid  or cholesterol levels are high.  You are older than 43 years of age.  You are at high risk for heart disease.  CANCER SCREENING   Lung Cancer  Lung cancer screening is recommended for adults 55-80 years old who are at high risk for lung cancer because of a history of smoking.  A yearly low-dose CT scan of the lungs is recommended for people who:  Currently smoke.  Have quit within the past 15 years.  Have at least a 30-pack-year history of smoking. A pack year is smoking an average of one pack of cigarettes a day for 1 year.  Yearly screening should continue until it has been 15 years since you quit.  Yearly screening should stop if you develop a health problem that would prevent you from having lung cancer treatment.  Breast Cancer  Practice breast self-awareness. This means understanding how your breasts normally appear and feel.  It also means doing regular breast self-exams. Let your health care provider know about any changes, no matter how small.  If you are in your 20s or 30s, you should have a clinical breast exam (CBE) by a health care provider every 1-3 years as part of a regular health exam.  If you are 40 or older, have a CBE every year. Also consider having a breast X-ray (mammogram) every year.  If you have a family history of breast cancer, talk to your health care provider about genetic screening.  If you   are at high risk for breast cancer, talk to your health care provider about having an MRI and a mammogram every year.  Breast cancer gene (BRCA) assessment is recommended for women who have family members with BRCA-related cancers. BRCA-related cancers include:  Breast.  Ovarian.  Tubal.  Peritoneal cancers.  Results of the assessment will determine the need for genetic counseling and BRCA1 and BRCA2 testing. Cervical Cancer Your health care provider may recommend that you be screened regularly for cancer of the pelvic organs (ovaries, uterus, and  vagina). This screening involves a pelvic examination, including checking for microscopic changes to the surface of your cervix (Pap test). You may be encouraged to have this screening done every 3 years, beginning at age 21.  For women ages 30-65, health care providers may recommend pelvic exams and Pap testing every 3 years, or they may recommend the Pap and pelvic exam, combined with testing for human papilloma virus (HPV), every 5 years. Some types of HPV increase your risk of cervical cancer. Testing for HPV may also be done on women of any age with unclear Pap test results.  Other health care providers may not recommend any screening for nonpregnant women who are considered low risk for pelvic cancer and who do not have symptoms. Ask your health care provider if a screening pelvic exam is right for you.  If you have had past treatment for cervical cancer or a condition that could lead to cancer, you need Pap tests and screening for cancer for at least 20 years after your treatment. If Pap tests have been discontinued, your risk factors (such as having a new sexual partner) need to be reassessed to determine if screening should resume. Some women have medical problems that increase the chance of getting cervical cancer. In these cases, your health care provider may recommend more frequent screening and Pap tests. Colorectal Cancer  This type of cancer can be detected and often prevented.  Routine colorectal cancer screening usually begins at 43 years of age and continues through 43 years of age.  Your health care provider may recommend screening at an earlier age if you have risk factors for colon cancer.  Your health care provider may also recommend using home test kits to check for hidden blood in the stool.  A small camera at the end of a tube can be used to examine your colon directly (sigmoidoscopy or colonoscopy). This is done to check for the earliest forms of colorectal  cancer.  Routine screening usually begins at age 50.  Direct examination of the colon should be repeated every 5-10 years through 43 years of age. However, you may need to be screened more often if early forms of precancerous polyps or small growths are found. Skin Cancer  Check your skin from head to toe regularly.  Tell your health care provider about any new moles or changes in moles, especially if there is a change in a mole's shape or color.  Also tell your health care provider if you have a mole that is larger than the size of a pencil eraser.  Always use sunscreen. Apply sunscreen liberally and repeatedly throughout the day.  Protect yourself by wearing long sleeves, pants, a wide-brimmed hat, and sunglasses whenever you are outside. HEART DISEASE, DIABETES, AND HIGH BLOOD PRESSURE   High blood pressure causes heart disease and increases the risk of stroke. High blood pressure is more likely to develop in:  People who have blood pressure in the high end   of the normal range (130-139/85-89 mm Hg).  People who are overweight or obese.  People who are African American.  If you are 38-23 years of age, have your blood pressure checked every 3-5 years. If you are 61 years of age or older, have your blood pressure checked every year. You should have your blood pressure measured twice--once when you are at a hospital or clinic, and once when you are not at a hospital or clinic. Record the average of the two measurements. To check your blood pressure when you are not at a hospital or clinic, you can use:  An automated blood pressure machine at a pharmacy.  A home blood pressure monitor.  If you are between 45 years and 39 years old, ask your health care provider if you should take aspirin to prevent strokes.  Have regular diabetes screenings. This involves taking a blood sample to check your fasting blood sugar level.  If you are at a normal weight and have a low risk for diabetes,  have this test once every three years after 43 years of age.  If you are overweight and have a high risk for diabetes, consider being tested at a younger age or more often. PREVENTING INFECTION  Hepatitis B  If you have a higher risk for hepatitis B, you should be screened for this virus. You are considered at high risk for hepatitis B if:  You were born in a country where hepatitis B is common. Ask your health care provider which countries are considered high risk.  Your parents were born in a high-risk country, and you have not been immunized against hepatitis B (hepatitis B vaccine).  You have HIV or AIDS.  You use needles to inject street drugs.  You live with someone who has hepatitis B.  You have had sex with someone who has hepatitis B.  You get hemodialysis treatment.  You take certain medicines for conditions, including cancer, organ transplantation, and autoimmune conditions. Hepatitis C  Blood testing is recommended for:  Everyone born from [birth year] through 1963.  Anyone with known risk factors for hepatitis C. Sexually transmitted infections (STIs)  You should be screened for sexually transmitted infections (STIs) including gonorrhea and chlamydia if:  You are sexually active and are younger than 43 years of age.  You are older than 43 years of age and your health care provider tells you that you are at risk for this type of infection.  Your sexual activity has changed since you were last screened and you are at an increased risk for chlamydia or gonorrhea. Ask your health care provider if you are at risk.  If you do not have HIV, but are at risk, it may be recommended that you take a prescription medicine daily to prevent HIV infection. This is called pre-exposure prophylaxis (PrEP). You are considered at risk if:  You are sexually active and do not regularly use condoms or know the HIV status of your partner(s).  You take drugs by injection.  You are sexually  active with a partner who has HIV. Talk with your health care provider about whether you are at high risk of being infected with HIV. If you choose to begin PrEP, you should first be tested for HIV. You should then be tested every 3 months for as long as you are taking PrEP.  PREGNANCY   If you are premenopausal and you may become pregnant, ask your health care provider about preconception counseling.  If you may  become pregnant, take 400 to 800 micrograms (mcg) of folic acid every day.  If you want to prevent pregnancy, talk to your health care provider about birth control (contraception). OSTEOPOROSIS AND MENOPAUSE   Osteoporosis is a disease in which the bones lose minerals and strength with aging. This can result in serious bone fractures. Your risk for osteoporosis can be identified using a bone density scan.  If you are 61 years of age or older, or if you are at risk for osteoporosis and fractures, ask your health care provider if you should be screened.  Ask your health care provider whether you should take a calcium or vitamin D supplement to lower your risk for osteoporosis.  Menopause may have certain physical symptoms and risks.  Hormone replacement therapy may reduce some of these symptoms and risks. Talk to your health care provider about whether hormone replacement therapy is right for you.  HOME CARE INSTRUCTIONS   Schedule regular health, dental, and eye exams.  Stay current with your immunizations.   Do not use any tobacco products including cigarettes, chewing tobacco, or electronic cigarettes.  If you are pregnant, do not drink alcohol.  If you are breastfeeding, limit how much and how often you drink alcohol.  Limit alcohol intake to no more than 1 drink per day for nonpregnant women. One drink equals 12 ounces of beer, 5 ounces of wine, or 1 ounces of hard liquor.  Do not use street drugs.  Do not share needles.  Ask your health care provider for help if  you need support or information about quitting drugs.  Tell your health care provider if you often feel depressed.  Tell your health care provider if you have ever been abused or do not feel safe at home.   This information is not intended to replace advice given to you by your health care provider. Make sure you discuss any questions you have with your health care provider.   Document Released: 03/05/2011 Document Revised: 09/10/2014 Document Reviewed: 07/22/2013 Elsevier Interactive Patient Education [employer].

## 2016-01-16 MED FILL — acetaZOLAMIDE 250 MG TABS: 250 | 30 days supply | Qty: 120 | Fill #7

## 2016-01-23 MED FILL — LISINOPRIL-HCTZ 20-12.5 MG: 20-12.5 | 90 days supply | Qty: 90 | Fill #0

## 2016-02-06 ENCOUNTER — Encounter: Payer: Self-pay | Admitting: Diagnostic Neuroimaging

## 2016-02-06 ENCOUNTER — Ambulatory Visit (INDEPENDENT_AMBULATORY_CARE_PROVIDER_SITE_OTHER): Payer: 59 | Admitting: Diagnostic Neuroimaging

## 2016-02-06 VITALS — BP 113/73 | HR 71 | Ht 66.0 in | Wt 244.0 lb

## 2016-02-06 DIAGNOSIS — R51 Headache: Secondary | ICD-10-CM

## 2016-02-06 DIAGNOSIS — R519 Headache, unspecified: Secondary | ICD-10-CM

## 2016-02-06 DIAGNOSIS — G932 Benign intracranial hypertension: Secondary | ICD-10-CM | POA: Diagnosis not present

## 2016-02-06 MED ORDER — ACETAZOLAMIDE 250 MG PO TABS: 500.0000 mg | ORAL_TABLET | Freq: Two times a day (BID) | ORAL | Status: DC
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
## 2016-02-06 NOTE — Patient Instructions (Signed)
-  continue current medications

## 2016-02-08 MED FILL — acetaZOLAMIDE 250 MG TABS: 250 | 90 days supply | Qty: 360 | Fill #0

## 2016-03-02 DIAGNOSIS — H47021 Hemorrhage in optic nerve sheath, right eye: Secondary | ICD-10-CM | POA: Diagnosis not present

## 2016-03-02 DIAGNOSIS — H4710 Unspecified papilledema: Secondary | ICD-10-CM | POA: Diagnosis not present

## 2016-04-22 MED FILL — LISINOPRIL-HCTZ 20-12.5 MG: 20-12.5 | 90 days supply | Qty: 90 | Fill #1

## 2016-05-14 MED FILL — acetaZOLAMIDE 250 MG TABS: 250 | 90 days supply | Qty: 360 | Fill #1

## 2016-06-22 ENCOUNTER — Ambulatory Visit (INDEPENDENT_AMBULATORY_CARE_PROVIDER_SITE_OTHER): Payer: 59 | Admitting: Family

## 2016-06-22 ENCOUNTER — Encounter: Payer: Self-pay | Admitting: Family

## 2016-06-22 VITALS — BP 102/65 | HR 78 | Temp 97.1°F | Ht 66.0 in | Wt 240.0 lb

## 2016-06-22 DIAGNOSIS — E669 Obesity, unspecified: Secondary | ICD-10-CM

## 2016-06-22 DIAGNOSIS — J301 Allergic rhinitis due to pollen: Secondary | ICD-10-CM

## 2016-06-22 DIAGNOSIS — E782 Mixed hyperlipidemia: Secondary | ICD-10-CM | POA: Diagnosis not present

## 2016-06-22 DIAGNOSIS — I10 Essential (primary) hypertension: Secondary | ICD-10-CM | POA: Diagnosis not present

## 2016-06-22 NOTE — Patient Instructions (Signed)
Health Maintenance, Female Adopting a healthy lifestyle and getting preventive care can go a long way to promote health and wellness. Talk with your health care provider about what schedule of regular examinations is right for you. This is a good chance for you to check in with your provider about disease prevention and staying healthy. In between checkups, there are plenty of things you can do on your own. Experts have done a lot of research about which lifestyle changes and preventive measures are most likely to keep you healthy. Ask your health care provider for more information. WEIGHT AND DIET  Eat a healthy diet  Be sure to include plenty of vegetables, fruits, low-fat dairy products, and lean protein.  Do not eat a lot of foods high in solid fats, added sugars, or salt.  Get regular exercise. This is one of the most important things you can do for your health.  Most adults should exercise for at least 150 minutes each week. The exercise should increase your heart rate and make you sweat (moderate-intensity exercise).  Most adults should also do strengthening exercises at least twice a week. This is in addition to the moderate-intensity exercise.  Maintain a healthy weight  Body mass index (BMI) is a measurement that can be used to identify possible weight problems. It estimates body fat based on height and weight. Your health care provider can help determine your BMI and help you achieve or maintain a healthy weight.  For females 20 years of age and older:   A BMI below 18.5 is considered underweight.  A BMI of 18.5 to 24.9 is normal.  A BMI of 25 to 29.9 is considered overweight.  A BMI of 30 and above is considered obese.  Watch levels of cholesterol and blood lipids  You should start having your blood tested for lipids and cholesterol at 43 years of age, then have this test every 5 years.  You may need to have your cholesterol levels checked more often if:  Your lipid  or cholesterol levels are high.  You are older than 43 years of age.  You are at high risk for heart disease.  CANCER SCREENING   Lung Cancer  Lung cancer screening is recommended for adults 55-80 years old who are at high risk for lung cancer because of a history of smoking.  A yearly low-dose CT scan of the lungs is recommended for people who:  Currently smoke.  Have quit within the past 15 years.  Have at least a 30-pack-year history of smoking. A pack year is smoking an average of one pack of cigarettes a day for 1 year.  Yearly screening should continue until it has been 15 years since you quit.  Yearly screening should stop if you develop a health problem that would prevent you from having lung cancer treatment.  Breast Cancer  Practice breast self-awareness. This means understanding how your breasts normally appear and feel.  It also means doing regular breast self-exams. Let your health care provider know about any changes, no matter how small.  If you are in your 20s or 30s, you should have a clinical breast exam (CBE) by a health care provider every 1-3 years as part of a regular health exam.  If you are 40 or older, have a CBE every year. Also consider having a breast X-ray (mammogram) every year.  If you have a family history of breast cancer, talk to your health care provider about genetic screening.  If you   are at high risk for breast cancer, talk to your health care provider about having an MRI and a mammogram every year.  Breast cancer gene (BRCA) assessment is recommended for women who have family members with BRCA-related cancers. BRCA-related cancers include:  Breast.  Ovarian.  Tubal.  Peritoneal cancers.  Results of the assessment will determine the need for genetic counseling and BRCA1 and BRCA2 testing. Cervical Cancer Your health care provider may recommend that you be screened regularly for cancer of the pelvic organs (ovaries, uterus, and  vagina). This screening involves a pelvic examination, including checking for microscopic changes to the surface of your cervix (Pap test). You may be encouraged to have this screening done every 3 years, beginning at age 21.  For women ages 30-65, health care providers may recommend pelvic exams and Pap testing every 3 years, or they may recommend the Pap and pelvic exam, combined with testing for human papilloma virus (HPV), every 5 years. Some types of HPV increase your risk of cervical cancer. Testing for HPV may also be done on women of any age with unclear Pap test results.  Other health care providers may not recommend any screening for nonpregnant women who are considered low risk for pelvic cancer and who do not have symptoms. Ask your health care provider if a screening pelvic exam is right for you.  If you have had past treatment for cervical cancer or a condition that could lead to cancer, you need Pap tests and screening for cancer for at least 20 years after your treatment. If Pap tests have been discontinued, your risk factors (such as having a new sexual partner) need to be reassessed to determine if screening should resume. Some women have medical problems that increase the chance of getting cervical cancer. In these cases, your health care provider may recommend more frequent screening and Pap tests. Colorectal Cancer  This type of cancer can be detected and often prevented.  Routine colorectal cancer screening usually begins at 43 years of age and continues through 43 years of age.  Your health care provider may recommend screening at an earlier age if you have risk factors for colon cancer.  Your health care provider may also recommend using home test kits to check for hidden blood in the stool.  A small camera at the end of a tube can be used to examine your colon directly (sigmoidoscopy or colonoscopy). This is done to check for the earliest forms of colorectal  cancer.  Routine screening usually begins at age 50.  Direct examination of the colon should be repeated every 5-10 years through 43 years of age. However, you may need to be screened more often if early forms of precancerous polyps or small growths are found. Skin Cancer  Check your skin from head to toe regularly.  Tell your health care provider about any new moles or changes in moles, especially if there is a change in a mole's shape or color.  Also tell your health care provider if you have a mole that is larger than the size of a pencil eraser.  Always use sunscreen. Apply sunscreen liberally and repeatedly throughout the day.  Protect yourself by wearing long sleeves, pants, a wide-brimmed hat, and sunglasses whenever you are outside. HEART DISEASE, DIABETES, AND HIGH BLOOD PRESSURE   High blood pressure causes heart disease and increases the risk of stroke. High blood pressure is more likely to develop in:  People who have blood pressure in the high end   of the normal range (130-139/85-89 mm Hg).  People who are overweight or obese.  People who are African American.  If you are 38-23 years of age, have your blood pressure checked every 3-5 years. If you are 61 years of age or older, have your blood pressure checked every year. You should have your blood pressure measured twice--once when you are at a hospital or clinic, and once when you are not at a hospital or clinic. Record the average of the two measurements. To check your blood pressure when you are not at a hospital or clinic, you can use:  An automated blood pressure machine at a pharmacy.  A home blood pressure monitor.  If you are between 45 years and 39 years old, ask your health care provider if you should take aspirin to prevent strokes.  Have regular diabetes screenings. This involves taking a blood sample to check your fasting blood sugar level.  If you are at a normal weight and have a low risk for diabetes,  have this test once every three years after 43 years of age.  If you are overweight and have a high risk for diabetes, consider being tested at a younger age or more often. PREVENTING INFECTION  Hepatitis B  If you have a higher risk for hepatitis B, you should be screened for this virus. You are considered at high risk for hepatitis B if:  You were born in a country where hepatitis B is common. Ask your health care provider which countries are considered high risk.  Your parents were born in a high-risk country, and you have not been immunized against hepatitis B (hepatitis B vaccine).  You have HIV or AIDS.  You use needles to inject street drugs.  You live with someone who has hepatitis B.  You have had sex with someone who has hepatitis B.  You get hemodialysis treatment.  You take certain medicines for conditions, including cancer, organ transplantation, and autoimmune conditions. Hepatitis C  Blood testing is recommended for:  Everyone born from [birth year] through 1963.  Anyone with known risk factors for hepatitis C. Sexually transmitted infections (STIs)  You should be screened for sexually transmitted infections (STIs) including gonorrhea and chlamydia if:  You are sexually active and are younger than 43 years of age.  You are older than 43 years of age and your health care provider tells you that you are at risk for this type of infection.  Your sexual activity has changed since you were last screened and you are at an increased risk for chlamydia or gonorrhea. Ask your health care provider if you are at risk.  If you do not have HIV, but are at risk, it may be recommended that you take a prescription medicine daily to prevent HIV infection. This is called pre-exposure prophylaxis (PrEP). You are considered at risk if:  You are sexually active and do not regularly use condoms or know the HIV status of your partner(s).  You take drugs by injection.  You are sexually  active with a partner who has HIV. Talk with your health care provider about whether you are at high risk of being infected with HIV. If you choose to begin PrEP, you should first be tested for HIV. You should then be tested every 3 months for as long as you are taking PrEP.  PREGNANCY   If you are premenopausal and you may become pregnant, ask your health care provider about preconception counseling.  If you may  become pregnant, take 400 to 800 micrograms (mcg) of folic acid every day.  If you want to prevent pregnancy, talk to your health care provider about birth control (contraception). OSTEOPOROSIS AND MENOPAUSE   Osteoporosis is a disease in which the bones lose minerals and strength with aging. This can result in serious bone fractures. Your risk for osteoporosis can be identified using a bone density scan.  If you are 61 years of age or older, or if you are at risk for osteoporosis and fractures, ask your health care provider if you should be screened.  Ask your health care provider whether you should take a calcium or vitamin D supplement to lower your risk for osteoporosis.  Menopause may have certain physical symptoms and risks.  Hormone replacement therapy may reduce some of these symptoms and risks. Talk to your health care provider about whether hormone replacement therapy is right for you.  HOME CARE INSTRUCTIONS   Schedule regular health, dental, and eye exams.  Stay current with your immunizations.   Do not use any tobacco products including cigarettes, chewing tobacco, or electronic cigarettes.  If you are pregnant, do not drink alcohol.  If you are breastfeeding, limit how much and how often you drink alcohol.  Limit alcohol intake to no more than 1 drink per day for nonpregnant women. One drink equals 12 ounces of beer, 5 ounces of wine, or 1 ounces of hard liquor.  Do not use street drugs.  Do not share needles.  Ask your health care provider for help if  you need support or information about quitting drugs.  Tell your health care provider if you often feel depressed.  Tell your health care provider if you have ever been abused or do not feel safe at home.   This information is not intended to replace advice given to you by your health care provider. Make sure you discuss any questions you have with your health care provider.   Document Released: 03/05/2011 Document Revised: 09/10/2014 Document Reviewed: 07/22/2013 Elsevier Interactive Patient Education [employer].

## 2016-06-22 NOTE — Progress Notes (Signed)
Subjective:    Patient ID: [name], female    DOB: [birth year], 45 y.o.   MRN: [id number]  Pt presents to the office today for chronic follow up. Pt has idiopathic intracranial hypertension and is followed by Dr. Leta Baptist, neurologists every 6 months. Pt states this is stable and doing well.  Hypertension  This is a chronic problem. The current episode started more than 1 year ago. The problem has been resolved since onset. The problem is controlled. Pertinent negatives include no anxiety, chest pain, headaches, malaise/fatigue, palpitations, peripheral edema or shortness of breath. Risk factors for coronary artery disease include dyslipidemia, obesity, post-menopausal state, sedentary lifestyle and family history. Past treatments include ACE inhibitors and diuretics. The current treatment provides moderate improvement. There is no history of kidney disease, CAD/MI, CVA, heart failure or a thyroid problem. There is no history of sleep apnea.  Hyperlipidemia  This is a chronic problem. The current episode started more than 1 month ago. The problem is controlled. Recent lipid tests were reviewed and are high. Exacerbating diseases include obesity. She has no history of diabetes. Pertinent negatives include no chest pain, leg pain or shortness of breath. Current antihyperlipidemic treatment includes diet change. The current treatment provides mild improvement of lipids. Risk factors for coronary artery disease include dyslipidemia, hypertension, obesity, a sedentary lifestyle, family history and post-menopausal.  Allergic Rhinitis  PT currently taking daily Zyrtec and Flonase. PT states this is well controlled at this time.     Review of Systems  Constitutional: Negative.  Negative for malaise/fatigue.  HENT: Negative.   Eyes: Negative.   Respiratory: Negative.  Negative for shortness of breath.   Cardiovascular: Negative.  Negative for chest pain and palpitations.  Gastrointestinal:  Negative.   Endocrine: Negative.   Genitourinary: Negative.   Musculoskeletal: Negative.   Neurological: Negative.  Negative for headaches.  Hematological: Negative.   Psychiatric/Behavioral: Negative.   All other systems reviewed and are negative.      Objective:   Physical Exam  Constitutional: She is oriented to person, place, and time. She appears well-developed and well-nourished. No distress.  HENT:  Head: Normocephalic and atraumatic.  Right Ear: External ear normal.  Left Ear: External ear normal.  Nose: Nose normal.  Mouth/Throat: Oropharynx is clear and moist.  Eyes: Pupils are equal, round, and reactive to light.  Neck: Normal range of motion. Neck supple. No thyromegaly present.  Cardiovascular: Normal rate, regular rhythm, normal heart sounds and intact distal pulses.   No murmur heard. Pulmonary/Chest: Effort normal and breath sounds normal. No respiratory distress. She has no wheezes.  Abdominal: Soft. Bowel sounds are normal. She exhibits no distension. There is no tenderness.  Musculoskeletal: Normal range of motion. She exhibits no edema or tenderness.  Neurological: She is alert and oriented to person, place, and time.  Skin: Skin is warm and dry.  Psychiatric: She has a normal mood and affect. Her behavior is normal. Judgment and thought content normal.  Vitals reviewed.     BP 102/65   Pulse 78   Temp 97.1 F (36.2 C) (Oral)   Ht '5\' 6"'$  (1.676 m)   Wt 240 lb (108.9 kg)   BMI 38.74 kg/m      Assessment & Plan:  1. Essential hypertension - CMP14+EGFR  2. Allergic rhinitis due to pollen, unspecified chronicity, unspecified seasonality - CMP14+EGFR  3. Mixed hyperlipidemia - CMP14+EGFR - Lipid panel  4. Obesity (BMI 30-39.9) - CMP14+EGFR   Continue all meds Labs pending Health  Maintenance reviewed Diet and exercise encouraged RTO 6 months   [name], FNP

## 2016-06-23 LAB — CMP14+EGFR
A/G RATIO: 1.7 (ref 1.2–2.2)
ALK PHOS: 37 IU/L — AB (ref 39–117)
ALT: 22 IU/L (ref 0–32)
AST: 14 IU/L (ref 0–40)
Albumin: 4.5 g/dL (ref 3.5–5.5)
BILIRUBIN TOTAL: 0.5 mg/dL (ref 0.0–1.2)
BUN/Creatinine Ratio: 19 (ref 9–23)
BUN: 17 mg/dL (ref 6–24)
CHLORIDE: 102 mmol/L (ref 96–106)
CO2: 17 mmol/L — ABNORMAL LOW (ref 18–29)
Calcium: 9.2 mg/dL (ref 8.7–10.2)
Creatinine, Ser: 0.91 mg/dL (ref 0.57–1.00)
GFR calc Af Amer: 89 mL/min/{1.73_m2} (ref >59)
GFR calc non Af Amer: 78 mL/min/{1.73_m2} (ref >59)
GLUCOSE: 83 mg/dL (ref 65–99)
Globulin, Total: 2.6 g/dL (ref 1.5–4.5)
POTASSIUM: 3.4 mmol/L — AB (ref 3.5–5.2)
Sodium: 138 mmol/L (ref 134–144)
Total Protein: 7.1 g/dL (ref 6.0–8.5)

## 2016-06-23 LAB — LIPID PANEL
CHOLESTEROL TOTAL: 176 mg/dL (ref 100–199)
Chol/HDL Ratio: 3.4 ratio units (ref 0.0–4.4)
HDL: 52 mg/dL (ref >39)
LDL Calculated: 108 mg/dL — ABNORMAL HIGH (ref 0–99)
Triglycerides: 79 mg/dL (ref 0–149)
VLDL CHOLESTEROL CAL: 16 mg/dL (ref 5–40)

## 2016-06-25 ENCOUNTER — Telehealth: Payer: Self-pay | Admitting: *Deleted

## 2016-06-25 NOTE — Telephone Encounter (Signed)
-----   Message from Sharion Balloon, [place] sent at 06/25/2016  8:35 AM EDT ----- Kidney and liver function stable Potassium slightly low- Pt needs to increase potassium rich foods LDL slightly elevated- PT needs to be on low fat diet and exercise

## 2016-06-25 NOTE — Telephone Encounter (Signed)
Left detailed message for pt with lab results

## 2016-07-23 ENCOUNTER — Other Ambulatory Visit: Payer: Self-pay | Admitting: Family

## 2016-07-23 DIAGNOSIS — G932 Benign intracranial hypertension: Secondary | ICD-10-CM

## 2016-07-23 DIAGNOSIS — I10 Essential (primary) hypertension: Secondary | ICD-10-CM

## 2016-07-24 MED FILL — LISINOPRIL-HCTZ 20-12.5 MG: 20-12.5 | 90 days supply | Qty: 90 | Fill #0

## 2016-08-07 ENCOUNTER — Ambulatory Visit (INDEPENDENT_AMBULATORY_CARE_PROVIDER_SITE_OTHER): Payer: 59 | Admitting: Diagnostic Neuroimaging

## 2016-08-07 ENCOUNTER — Encounter: Payer: Self-pay | Admitting: Diagnostic Neuroimaging

## 2016-08-07 VITALS — BP 124/79 | HR 76 | Wt 243.4 lb

## 2016-08-07 DIAGNOSIS — H4710 Unspecified papilledema: Secondary | ICD-10-CM | POA: Diagnosis not present

## 2016-08-07 DIAGNOSIS — G932 Benign intracranial hypertension: Secondary | ICD-10-CM | POA: Diagnosis not present

## 2016-08-07 MED ORDER — ACETAZOLAMIDE 250 MG PO TABS: 500.0000 mg | ORAL_TABLET | Freq: Two times a day (BID) | ORAL | 4 refills | Status: DC

## 2016-08-07 MED FILL — acetaZOLAMIDE 250 MG TABS: 250 | 90 days supply | Qty: 360 | Fill #0
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
## 2016-09-18 ENCOUNTER — Encounter: Payer: Self-pay | Admitting: Family

## 2016-09-18 ENCOUNTER — Ambulatory Visit (INDEPENDENT_AMBULATORY_CARE_PROVIDER_SITE_OTHER): Payer: 59 | Admitting: Family

## 2016-09-18 VITALS — BP 124/70 | HR 78 | Temp 97.4°F | Ht 66.0 in | Wt 238.0 lb

## 2016-09-18 DIAGNOSIS — J209 Acute bronchitis, unspecified: Secondary | ICD-10-CM | POA: Diagnosis not present

## 2016-09-18 MED ORDER — ALBUTEROL SULFATE HFA 108 (90 BASE) MCG/ACT IN AERS: 2.0000 | INHALATION_SPRAY | RESPIRATORY_TRACT | 0 refills | Status: DC | PRN

## 2016-09-18 MED ORDER — BENZONATATE 200 MG PO CAPS: 200.0000 mg | ORAL_CAPSULE | Freq: Three times a day (TID) | ORAL | 1 refills | Status: DC | PRN

## 2016-09-18 MED ORDER — AZITHROMYCIN 250 MG PO TABS: ORAL_TABLET | ORAL | 0 refills | Status: DC

## 2016-09-18 MED ORDER — PREDNISONE 10 MG (21) PO TBPK: ORAL_TABLET | ORAL | 0 refills | Status: DC

## 2016-09-18 NOTE — Addendum Note (Signed)
Addended by: [name] A on: 09/18/2016 10:17 AM   Modules accepted: Orders

## 2016-09-18 NOTE — Patient Instructions (Signed)

## 2016-09-18 NOTE — Progress Notes (Signed)
Subjective:    Patient ID: [name], female    DOB: 01-Sep-1971, 46 y.o.   MRN: [id number]  Cough  This is a new problem. The current episode started more than 1 month ago. The problem has been waxing and waning. The problem occurs every few minutes. The cough is productive of sputum. Associated symptoms include headaches, nasal congestion, postnasal drip, rhinorrhea, a sore throat and wheezing. Pertinent negatives include no chills, ear congestion, ear pain, fever or myalgias. She has tried OTC cough suppressant for the symptoms. The treatment provided mild relief. There is no history of asthma.      Review of Systems  Constitutional: Negative for chills and fever.  HENT: Positive for postnasal drip, rhinorrhea and sore throat. Negative for ear pain.   Respiratory: Positive for cough and wheezing.   Musculoskeletal: Negative for myalgias.  Neurological: Positive for headaches.  All other systems reviewed and are negative.      Objective:   Physical Exam  Constitutional: She is oriented to person, place, and time. She appears well-developed and well-nourished. No distress.  HENT:  Head: Normocephalic and atraumatic.  Right Ear: External ear normal.  Left Ear: External ear normal.  Nose: Mucosal edema and rhinorrhea present.  Mouth/Throat: Posterior oropharyngeal erythema present.  Eyes: Pupils are equal, round, and reactive to light.  Neck: Normal range of motion. Neck supple. No thyromegaly present.  Cardiovascular: Normal rate, regular rhythm, normal heart sounds and intact distal pulses.   No murmur heard. Pulmonary/Chest: Effort normal. No respiratory distress. She has wheezes in the right upper field, the right middle field, the right lower field, the left upper field, the left middle field and the left lower field.  Abdominal: Soft. Bowel sounds are normal. She exhibits no distension. There is no tenderness.  Musculoskeletal: Normal range of motion. She exhibits no  edema or tenderness.  Neurological: She is alert and oriented to person, place, and time. She has normal reflexes. No cranial nerve deficit.  Skin: Skin is warm and dry.  Psychiatric: She has a normal mood and affect. Her behavior is normal. Judgment and thought content normal.  Vitals reviewed.     BP 124/70   Pulse 78   Temp 97.4 F (36.3 C) (Oral)   Ht 5\' 6"  (1.676 m)   Wt 238 lb (108 kg)   BMI 38.41 kg/m      Assessment & Plan:  1. Acute bronchitis, unspecified organism - Take meds as prescribed - Use a cool mist humidifier  -Use saline nose sprays frequently -Saline irrigations of the nose can be very helpful if done frequently.  * 4X daily for 1 week*  * Use of a nettie pot can be helpful with this. Follow directions with this* -Force fluids -For any cough or congestion  Use plain Mucinex- regular strength or max strength is fine   * Children- consult with Pharmacist for dosing -For fever or aces or pains- take tylenol or ibuprofen appropriate for age and weight.  * for fevers greater than 101 orally you may alternate ibuprofen and tylenol every  3 hours. -Throat lozenges if help - predniSONE (STERAPRED UNI-PAK 21 TAB) 10 MG (21) TBPK tablet; Use as directed  Dispense: 21 tablet; Refill: 0 - azithromycin (ZITHROMAX) 250 MG tablet; Take 500 mg once, then 250 mg for four days  Dispense: 6 tablet; Refill: 0 - albuterol (PROVENTIL HFA;VENTOLIN HFA) 108 (90 Base) MCG/ACT inhaler; Inhale 2 puffs into the lungs every 4 (four) hours as needed  for wheezing or shortness of breath.  Dispense: 1 Inhaler; Refill: 0 - benzonatate (TESSALON) 200 MG capsule; Take 1 capsule (200 mg total) by mouth 3 (three) times daily as needed.  Dispense: 30 capsule; Refill: [place], FNP

## 2016-10-22 MED FILL — LISINOPRIL-HCTZ 20-12.5 MG: 20-12.5 | 90 days supply | Qty: 90 | Fill #1

## 2016-11-02 ENCOUNTER — Encounter: Payer: Self-pay | Admitting: *Deleted

## 2016-11-12 MED FILL — acetaZOLAMIDE 250 MG TABS: 250 | 90 days supply | Qty: 360 | Fill #1

## 2016-12-19 ENCOUNTER — Telehealth: Payer: Self-pay | Admitting: Family

## 2016-12-19 ENCOUNTER — Encounter: Payer: Self-pay | Admitting: Family

## 2016-12-19 ENCOUNTER — Ambulatory Visit (INDEPENDENT_AMBULATORY_CARE_PROVIDER_SITE_OTHER): Payer: 59 | Admitting: Family

## 2016-12-19 VITALS — BP 124/69 | HR 91 | Temp 97.1°F | Ht 66.0 in | Wt 236.6 lb

## 2016-12-19 DIAGNOSIS — Z01419 Encounter for gynecological examination (general) (routine) without abnormal findings: Secondary | ICD-10-CM

## 2016-12-19 DIAGNOSIS — J301 Allergic rhinitis due to pollen: Secondary | ICD-10-CM | POA: Diagnosis not present

## 2016-12-19 DIAGNOSIS — Z0000 Encounter for general adult medical examination without abnormal findings: Secondary | ICD-10-CM | POA: Diagnosis not present

## 2016-12-19 DIAGNOSIS — G932 Benign intracranial hypertension: Secondary | ICD-10-CM

## 2016-12-19 DIAGNOSIS — E782 Mixed hyperlipidemia: Secondary | ICD-10-CM

## 2016-12-19 DIAGNOSIS — E669 Obesity, unspecified: Secondary | ICD-10-CM | POA: Diagnosis not present

## 2016-12-19 LAB — URINALYSIS, COMPLETE
BILIRUBIN UA: NEGATIVE
Glucose, UA: NEGATIVE
Ketones, UA: NEGATIVE
LEUKOCYTES UA: NEGATIVE
Nitrite, UA: NEGATIVE
PH UA: 5 (ref 5.0–7.5)
PROTEIN UA: NEGATIVE
RBC UA: NEGATIVE
SPEC GRAV UA: 1.03 (ref 1.005–1.030)
Urobilinogen, Ur: 0.2 mg/dL (ref 0.2–1.0)

## 2016-12-19 LAB — MICROSCOPIC EXAMINATION: Renal Epithel, UA: NONE SEEN /hpf

## 2016-12-19 MED ORDER — ALBUTEROL SULFATE HFA 108 (90 BASE) MCG/ACT IN AERS: 2.0000 | INHALATION_SPRAY | RESPIRATORY_TRACT | 0 refills | Status: DC | PRN

## 2016-12-19 MED ORDER — MONTELUKAST SODIUM 10 MG PO TABS
10.0000 mg | ORAL_TABLET | Freq: Every day | ORAL | 3 refills | Status: DC
Start: 2016-12-19 — End: 2017-04-22

## 2016-12-19 MED FILL — VENTOLIN HFA 90 MCG INHALER: 108 (90 BAS | 16 days supply | Qty: 18 | Fill #0

## 2016-12-19 NOTE — Telephone Encounter (Signed)
Rx resent to [facility] per patient's request

## 2016-12-19 NOTE — Patient Instructions (Signed)
Health Maintenance, Female Adopting a healthy lifestyle and getting preventive care can go a long way to promote health and wellness. Talk with your health care provider about what schedule of regular examinations is right for you. This is a good chance for you to check in with your provider about disease prevention and staying healthy. In between checkups, there are plenty of things you can do on your own. Experts have done a lot of research about which lifestyle changes and preventive measures are most likely to keep you healthy. Ask your health care provider for more information. Weight and diet Eat a healthy diet  Be sure to include plenty of vegetables, fruits, low-fat dairy products, and lean protein.  Do not eat a lot of foods high in solid fats, added sugars, or salt.  Get regular exercise. This is one of the most important things you can do for your health.  Most adults should exercise for at least 150 minutes each week. The exercise should increase your heart rate and make you sweat (moderate-intensity exercise).  Most adults should also do strengthening exercises at least twice a week. This is in addition to the moderate-intensity exercise. Maintain a healthy weight  Body mass index (BMI) is a measurement that can be used to identify possible weight problems. It estimates body fat based on height and weight. Your health care provider can help determine your BMI and help you achieve or maintain a healthy weight.  For females 76 years of age and older:  A BMI below 18.5 is considered underweight.  A BMI of 18.5 to 24.9 is normal.  A BMI of 25 to 29.9 is considered overweight.  A BMI of 30 and above is considered obese. Watch levels of cholesterol and blood lipids  You should start having your blood tested for lipids and cholesterol at 44 years of age, then have this test every 5 years.  You may need to have your cholesterol levels checked more often if:  Your lipid or  cholesterol levels are high.  You are older than 44 years of age.  You are at high risk for heart disease. Cancer screening Lung Cancer  Lung cancer screening is recommended for adults 64-42 years old who are at high risk for lung cancer because of a history of smoking.  A yearly low-dose CT scan of the lungs is recommended for people who:  Currently smoke.  Have quit within the past 15 years.  Have at least a 30-pack-year history of smoking. A pack year is smoking an average of one pack of cigarettes a day for 1 year.  Yearly screening should continue until it has been 15 years since you quit.  Yearly screening should stop if you develop a health problem that would prevent you from having lung cancer treatment. Breast Cancer  Practice breast self-awareness. This means understanding how your breasts normally appear and feel.  It also means doing regular breast self-exams. Let your health care provider know about any changes, no matter how small.  If you are in your 20s or 30s, you should have a clinical breast exam (CBE) by a health care provider every 1-3 years as part of a regular health exam.  If you are 34 or older, have a CBE every year. Also consider having a breast X-ray (mammogram) every year.  If you have a family history of breast cancer, talk to your health care provider about genetic screening.  If you are at high risk for breast cancer, talk  to your health care provider about having an MRI and a mammogram every year.  Breast cancer gene (BRCA) assessment is recommended for women who have family members with BRCA-related cancers. BRCA-related cancers include:  Breast.  Ovarian.  Tubal.  Peritoneal cancers.  Results of the assessment will determine the need for genetic counseling and BRCA1 and BRCA2 testing. Cervical Cancer  Your health care provider may recommend that you be screened regularly for cancer of the pelvic organs (ovaries, uterus, and vagina).  This screening involves a pelvic examination, including checking for microscopic changes to the surface of your cervix (Pap test). You may be encouraged to have this screening done every 3 years, beginning at age 24.  For women ages 66-65, health care providers may recommend pelvic exams and Pap testing every 3 years, or they may recommend the Pap and pelvic exam, combined with testing for human papilloma virus (HPV), every 5 years. Some types of HPV increase your risk of cervical cancer. Testing for HPV may also be done on women of any age with unclear Pap test results.  Other health care providers may not recommend any screening for nonpregnant women who are considered low risk for pelvic cancer and who do not have symptoms. Ask your health care provider if a screening pelvic exam is right for you.  If you have had past treatment for cervical cancer or a condition that could lead to cancer, you need Pap tests and screening for cancer for at least 20 years after your treatment. If Pap tests have been discontinued, your risk factors (such as having a new sexual partner) need to be reassessed to determine if screening should resume. Some women have medical problems that increase the chance of getting cervical cancer. In these cases, your health care provider may recommend more frequent screening and Pap tests. Colorectal Cancer  This type of cancer can be detected and often prevented.  Routine colorectal cancer screening usually begins at 44 years of age and continues through 44 years of age.  Your health care provider may recommend screening at an earlier age if you have risk factors for colon cancer.  Your health care provider may also recommend using home test kits to check for hidden blood in the stool.  A small camera at the end of a tube can be used to examine your colon directly (sigmoidoscopy or colonoscopy). This is done to check for the earliest forms of colorectal cancer.  Routine  screening usually begins at age 41.  Direct examination of the colon should be repeated every 5-10 years through 44 years of age. However, you may need to be screened more often if early forms of precancerous polyps or small growths are found. Skin Cancer  Check your skin from head to toe regularly.  Tell your health care provider about any new moles or changes in moles, especially if there is a change in a mole's shape or color.  Also tell your health care provider if you have a mole that is larger than the size of a pencil eraser.  Always use sunscreen. Apply sunscreen liberally and repeatedly throughout the day.  Protect yourself by wearing long sleeves, pants, a wide-brimmed hat, and sunglasses whenever you are outside. Heart disease, diabetes, and high blood pressure  High blood pressure causes heart disease and increases the risk of stroke. High blood pressure is more likely to develop in:  People who have blood pressure in the high end of the normal range (130-139/85-89 mm Hg).  People who are overweight or obese.  People who are African American.  If you are 59-24 years of age, have your blood pressure checked every 3-5 years. If you are 34 years of age or older, have your blood pressure checked every year. You should have your blood pressure measured twice-once when you are at a hospital or clinic, and once when you are not at a hospital or clinic. Record the average of the two measurements. To check your blood pressure when you are not at a hospital or clinic, you can use:  An automated blood pressure machine at a pharmacy.  A home blood pressure monitor.  If you are between 29 years and 60 years old, ask your health care provider if you should take aspirin to prevent strokes.  Have regular diabetes screenings. This involves taking a blood sample to check your fasting blood sugar level.  If you are at a normal weight and have a low risk for diabetes, have this test once  every three years after 44 years of age.  If you are overweight and have a high risk for diabetes, consider being tested at a younger age or more often. Preventing infection Hepatitis B  If you have a higher risk for hepatitis B, you should be screened for this virus. You are considered at high risk for hepatitis B if:  You were born in a country where hepatitis B is common. Ask your health care provider which countries are considered high risk.  Your parents were born in a high-risk country, and you have not been immunized against hepatitis B (hepatitis B vaccine).  You have HIV or AIDS.  You use needles to inject street drugs.  You live with someone who has hepatitis B.  You have had sex with someone who has hepatitis B.  You get hemodialysis treatment.  You take certain medicines for conditions, including cancer, organ transplantation, and autoimmune conditions. Hepatitis C  Blood testing is recommended for:  Everyone born from [birth year] through 1963.  Anyone with known risk factors for hepatitis C. Sexually transmitted infections (STIs)  You should be screened for sexually transmitted infections (STIs) including gonorrhea and chlamydia if:  You are sexually active and are younger than 44 years of age.  You are older than 44 years of age and your health care provider tells you that you are at risk for this type of infection.  Your sexual activity has changed since you were last screened and you are at an increased risk for chlamydia or gonorrhea. Ask your health care provider if you are at risk.  If you do not have HIV, but are at risk, it may be recommended that you take a prescription medicine daily to prevent HIV infection. This is called pre-exposure prophylaxis (PrEP). You are considered at risk if:  You are sexually active and do not regularly use condoms or know the HIV status of your partner(s).  You take drugs by injection.  You are sexually active with a partner  who has HIV. Talk with your health care provider about whether you are at high risk of being infected with HIV. If you choose to begin PrEP, you should first be tested for HIV. You should then be tested every 3 months for as long as you are taking PrEP. Pregnancy  If you are premenopausal and you may become pregnant, ask your health care provider about preconception counseling.  If you may become pregnant, take 400 to 800 micrograms (mcg) of folic acid  every day.  If you want to prevent pregnancy, talk to your health care provider about birth control (contraception). Osteoporosis and menopause  Osteoporosis is a disease in which the bones lose minerals and strength with aging. This can result in serious bone fractures. Your risk for osteoporosis can be identified using a bone density scan.  If you are 4 years of age or older, or if you are at risk for osteoporosis and fractures, ask your health care provider if you should be screened.  Ask your health care provider whether you should take a calcium or vitamin D supplement to lower your risk for osteoporosis.  Menopause may have certain physical symptoms and risks.  Hormone replacement therapy may reduce some of these symptoms and risks. Talk to your health care provider about whether hormone replacement therapy is right for you. Follow these instructions at home:  Schedule regular health, dental, and eye exams.  Stay current with your immunizations.  Do not use any tobacco products including cigarettes, chewing tobacco, or electronic cigarettes.  If you are pregnant, do not drink alcohol.  If you are breastfeeding, limit how much and how often you drink alcohol.  Limit alcohol intake to no more than 1 drink per day for nonpregnant women. One drink equals 12 ounces of beer, 5 ounces of wine, or 1 ounces of hard liquor.  Do not use street drugs.  Do not share needles.  Ask your health care provider for help if you need support  or information about quitting drugs.  Tell your health care provider if you often feel depressed.  Tell your health care provider if you have ever been abused or do not feel safe at home. This information is not intended to replace advice given to you by your health care provider. Make sure you discuss any questions you have with your health care provider. Document Released: 03/05/2011 Document Revised: 01/26/2016 Document Reviewed: 05/24/2015 Elsevier Interactive Patient Education  2017 [employer].

## 2016-12-19 NOTE — Progress Notes (Signed)
Subjective:    Patient ID: [name], female    DOB: 1971/08/27, 46 y.o.   MRN: [id number]  Pt presents to the office today for CPE with pap. Pt has idiopathic intracranial hypertension and is followed by Dr. Leta Baptist, neurologists every 6 months.   Gynecologic Exam  The patient's pertinent negatives include no genital odor or vaginal discharge. The patient is experiencing no pain. Pertinent negatives include no headaches.  Hypertension  This is a chronic problem. The current episode started more than 1 year ago. The problem has been resolved since onset. The problem is controlled. Pertinent negatives include no anxiety, chest pain, headaches, malaise/fatigue, palpitations, peripheral edema or shortness of breath. Risk factors for coronary artery disease include dyslipidemia, obesity, post-menopausal state, sedentary lifestyle and family history. Past treatments include ACE inhibitors and diuretics. The current treatment provides moderate improvement. There is no history of kidney disease, CAD/MI, CVA or heart failure. There is no history of sleep apnea or a thyroid problem.  Hyperlipidemia  This is a chronic problem. The current episode started more than 1 month ago. The problem is uncontrolled. Recent lipid tests were reviewed and are high. Exacerbating diseases include obesity. She has no history of diabetes. Pertinent negatives include no chest pain, leg pain or shortness of breath. Current antihyperlipidemic treatment includes diet change. The current treatment provides mild improvement of lipids. Risk factors for coronary artery disease include dyslipidemia, hypertension, obesity, a sedentary lifestyle, family history and post-menopausal.  Allergic Rhinitis PT currently zyrtec and nasonex. States she continues to have rhinorrhea, postnasal dip,sneezing, and wheezing.     Review of Systems  Constitutional: Negative.  Negative for malaise/fatigue.  HENT: Positive for postnasal drip  and rhinorrhea.   Eyes: Negative.   Respiratory: Positive for wheezing. Negative for shortness of breath.   Cardiovascular: Negative.  Negative for chest pain and palpitations.  Gastrointestinal: Negative.   Endocrine: Negative.   Genitourinary: Negative.  Negative for vaginal discharge.  Musculoskeletal: Negative.   Neurological: Negative.  Negative for headaches.  Hematological: Negative.   Psychiatric/Behavioral: Negative.   All other systems reviewed and are negative.  Social History   Social History  . Marital status: Married    Spouse name: Charlotte Crumb  . Number of children: 0  . Years of education: 14   Occupational History  . CT tech [employer]   Social History Main Topics  . Smoking status: Never Smoker  . Smokeless tobacco: Never Used  . Alcohol use No  . Drug use: No  . Sexual activity: Not Asked   Other Topics Concern  . None   Social History Narrative   Married, no children, lives at home   Caffeine use- 2-3 cups coffee 5 days a week   Family History  Problem Relation Age of Onset  . Cataracts Mother   . Diabetes Mother   . Hypertension Mother   . Diabetes Father   . Hypertension Father        Objective:   Physical Exam  Constitutional: She is oriented to person, place, and time. She appears well-developed and well-nourished. No distress.  HENT:  Head: Normocephalic and atraumatic.  Right Ear: External ear normal.  Left Ear: External ear normal.  Nose: Nose normal.  Mouth/Throat: Oropharynx is clear and moist.  Eyes: Pupils are equal, round, and reactive to light.  Neck: Normal range of motion. Neck supple. No thyromegaly present.  Cardiovascular: Normal rate, regular rhythm, normal heart sounds and intact distal pulses.  No murmur heard. Pulmonary/Chest: Effort normal and breath sounds normal. No respiratory distress. She has no wheezes. Right breast exhibits no inverted nipple, no mass, no nipple discharge, no skin change and no  tenderness. Left breast exhibits no inverted nipple, no mass, no nipple discharge, no skin change and no tenderness. Breasts are symmetrical.  Abdominal: Soft. Bowel sounds are normal. She exhibits no distension. There is no tenderness.  Genitourinary: Vagina normal.  Genitourinary Comments: Bimanual exam- no adnexal masses or tenderness, ovaries nonpalpable   Cervix parous and pink- No discharge   Musculoskeletal: Normal range of motion. She exhibits no edema or tenderness.  Neurological: She is alert and oriented to person, place, and time. She has normal reflexes. No cranial nerve deficit.  Skin: Skin is warm and dry.  Psychiatric: She has a normal mood and affect. Her behavior is normal. Judgment and thought content normal.  Vitals reviewed.    BP 124/69   Pulse 91   Temp 97.1 F (36.2 C) (Oral)   Ht 5' 6" (1.676 m)   Wt 236 lb 9.6 oz (107.3 kg)   LMP 11/23/2016   BMI 38.19 kg/m      Assessment & Plan:  1. Gynecologic exam normal - Urinalysis, Complete - CBC with Differential/Platelet - CMP14+EGFR - Pap IG w/ reflex to HPV when ASC-U  2. Idiopathic intracranial hypertension - CBC with Differential/Platelet - CMP14+EGFR  3. Allergic rhinitis due to pollen, unspecified seasonality -Singulair added - CBC with Differential/Platelet - CMP14+EGFR - montelukast (SINGULAIR) 10 MG tablet; Take 1 tablet (10 mg total) by mouth at bedtime.  Dispense: 30 tablet; Refill: 3 - albuterol (PROVENTIL HFA;VENTOLIN HFA) 108 (90 Base) MCG/ACT inhaler; Inhale 2 puffs into the lungs every 4 (four) hours as needed for wheezing or shortness of breath.  Dispense: 1 Inhaler; Refill: 0  4. Obesity (BMI 30-39.9) - CBC with Differential/Platelet - CMP14+EGFR  5. Mixed hyperlipidemia - CBC with Differential/Platelet - CMP14+EGFR - Lipid panel  6. Annual physical exam - CBC with Differential/Platelet - CMP14+EGFR - Lipid panel - Thyroid Panel With TSH - VITAMIN D 25 Hydroxy (Vit-D  Deficiency, Fractures) - Pap IG w/ reflex to HPV when ASC-U   Continue all meds Labs pending Health Maintenance reviewed Diet and exercise encouraged RTO 6 months   Christy Hawks, FNP  

## 2016-12-20 LAB — LIPID PANEL
CHOLESTEROL TOTAL: 161 mg/dL (ref 100–199)
Chol/HDL Ratio: 3.2 ratio (ref 0.0–4.4)
HDL: 51 mg/dL (ref >39)
LDL Calculated: 92 mg/dL (ref 0–99)
TRIGLYCERIDES: 88 mg/dL (ref 0–149)
VLDL Cholesterol Cal: 18 mg/dL (ref 5–40)

## 2016-12-20 LAB — CBC WITH DIFFERENTIAL/PLATELET
BASOS ABS: 0 10*3/uL (ref 0.0–0.2)
Basos: 0 %
EOS (ABSOLUTE): 0.5 10*3/uL — ABNORMAL HIGH (ref 0.0–0.4)
Eos: 6 %
Hematocrit: 41.7 % (ref 34.0–46.6)
Hemoglobin: 13.5 g/dL (ref 11.1–15.9)
IMMATURE GRANS (ABS): 0 10*3/uL (ref 0.0–0.1)
Immature Granulocytes: 0 %
LYMPHS ABS: 2.1 10*3/uL (ref 0.7–3.1)
Lymphs: 27 %
MCH: 29.6 pg (ref 26.6–33.0)
MCHC: 32.4 g/dL (ref 31.5–35.7)
MCV: 91 fL (ref 79–97)
MONOS ABS: 0.4 10*3/uL (ref 0.1–0.9)
Monocytes: 6 %
Neutrophils Absolute: 4.8 10*3/uL (ref 1.4–7.0)
Neutrophils: 61 %
PLATELETS: 260 10*3/uL (ref 150–379)
RBC: 4.56 x10E6/uL (ref 3.77–5.28)
RDW: 13.1 % (ref 12.3–15.4)
WBC: 7.9 10*3/uL (ref 3.4–10.8)

## 2016-12-20 LAB — CMP14+EGFR
A/G RATIO: 1.6 (ref 1.2–2.2)
ALT: 10 IU/L (ref 0–32)
AST: 11 IU/L (ref 0–40)
Albumin: 4.4 g/dL (ref 3.5–5.5)
Alkaline Phosphatase: 39 IU/L (ref 39–117)
BUN/Creatinine Ratio: 20 (ref 9–23)
BUN: 19 mg/dL (ref 6–24)
Bilirubin Total: 0.5 mg/dL (ref 0.0–1.2)
CALCIUM: 9.4 mg/dL (ref 8.7–10.2)
CHLORIDE: 106 mmol/L (ref 96–106)
CO2: 16 mmol/L — ABNORMAL LOW (ref 18–29)
Creatinine, Ser: 0.95 mg/dL (ref 0.57–1.00)
GFR calc Af Amer: 85 mL/min/{1.73_m2} (ref >59)
GFR, EST NON AFRICAN AMERICAN: 74 mL/min/{1.73_m2} (ref >59)
Globulin, Total: 2.7 g/dL (ref 1.5–4.5)
Glucose: 84 mg/dL (ref 65–99)
POTASSIUM: 3.5 mmol/L (ref 3.5–5.2)
SODIUM: 140 mmol/L (ref 134–144)
TOTAL PROTEIN: 7.1 g/dL (ref 6.0–8.5)

## 2016-12-20 LAB — THYROID PANEL WITH TSH
Free Thyroxine Index: 2.3 (ref 1.2–4.9)
T3 Uptake Ratio: 28 % (ref 24–39)
T4 TOTAL: 8.1 ug/dL (ref 4.5–12.0)
TSH: 1.85 u[IU]/mL (ref 0.450–4.500)

## 2016-12-20 LAB — VITAMIN D 25 HYDROXY (VIT D DEFICIENCY, FRACTURES): VIT D 25 HYDROXY: 35.3 ng/mL (ref 30.0–100.0)

## 2016-12-21 ENCOUNTER — Encounter: Payer: 59 | Admitting: Family

## 2016-12-21 LAB — PAP IG W/ RFLX HPV ASCU: PAP SMEAR COMMENT: 0

## 2017-01-14 MED FILL — MONTELUKAST SOD 10 MG TAB: 10 | 90 days supply | Qty: 90 | Fill #0

## 2017-01-21 ENCOUNTER — Other Ambulatory Visit: Payer: Self-pay | Admitting: Family

## 2017-01-21 DIAGNOSIS — G932 Benign intracranial hypertension: Secondary | ICD-10-CM

## 2017-01-21 DIAGNOSIS — I10 Essential (primary) hypertension: Secondary | ICD-10-CM

## 2017-01-21 MED FILL — LISINOPRIL-HCTZ 20-12.5 MG: 20-12.5 | 90 days supply | Qty: 90 | Fill #0

## 2017-01-30 MED FILL — acetaZOLAMIDE 250 MG TABS: 250 | 90 days supply | Qty: 360 | Fill #2

## 2017-02-05 ENCOUNTER — Encounter (INDEPENDENT_AMBULATORY_CARE_PROVIDER_SITE_OTHER): Payer: Self-pay

## 2017-02-05 ENCOUNTER — Ambulatory Visit (INDEPENDENT_AMBULATORY_CARE_PROVIDER_SITE_OTHER): Payer: 59 | Admitting: Diagnostic Neuroimaging

## 2017-02-05 ENCOUNTER — Encounter: Payer: Self-pay | Admitting: Diagnostic Neuroimaging

## 2017-02-05 VITALS — BP 119/71 | HR 78 | Ht 66.0 in | Wt 236.0 lb

## 2017-02-05 DIAGNOSIS — Z6838 Body mass index (BMI) 38.0-38.9, adult: Secondary | ICD-10-CM

## 2017-02-05 DIAGNOSIS — G932 Benign intracranial hypertension: Secondary | ICD-10-CM

## 2017-02-05 DIAGNOSIS — R42 Dizziness and giddiness: Secondary | ICD-10-CM

## 2017-02-05 MED ORDER — ACETAZOLAMIDE 250 MG PO TABS: 500.0000 mg | ORAL_TABLET | Freq: Two times a day (BID) | ORAL | 4 refills | Status: DC
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
## 2017-02-07 DIAGNOSIS — H47021 Hemorrhage in optic nerve sheath, right eye: Secondary | ICD-10-CM | POA: Diagnosis not present

## 2017-02-07 DIAGNOSIS — H4710 Unspecified papilledema: Secondary | ICD-10-CM | POA: Diagnosis not present

## 2017-02-28 ENCOUNTER — Telehealth: Payer: Self-pay | Admitting: Diagnostic Neuroimaging

## 2017-02-28 NOTE — Telephone Encounter (Signed)
pls call patient and find out more info. -VRP

## 2017-02-28 NOTE — Telephone Encounter (Signed)
Pt states she was advised by Dr Leta Baptist to try to cut back on meds and to let him know how that goes.  Pt said that she did that and has now had a headache for over 24 hours causing her to feel nauseated and vomiting, pt is back on meds as originally prescribed

## 2017-03-01 NOTE — Telephone Encounter (Signed)
Thanks for the info. Noted. -VRP

## 2017-03-01 NOTE — Telephone Encounter (Addendum)
Spoke with patient who stated when she last saw Dr Leta Baptist he had said if she wanted to try cutting back on the diamox she could. She stopped it for  1 1/2 weeks, but she developed a headache. She restarted Diamox 500 mg bid. She stated she is feeling much better, still feels pressure on the top of her head. She stated that she was updating Dr Leta Baptist as he had requested she do if she stopped the medication.  She stated she is doing fine. This RN stated she will give Dr Leta Baptist this information and only call her back if he has other information for her.  Advised her the office closes at noon today; there is a dr on call on weekends. She verbalized understanding, appreciation.

## 2017-04-22 ENCOUNTER — Other Ambulatory Visit: Payer: Self-pay | Admitting: Family

## 2017-04-22 DIAGNOSIS — G932 Benign intracranial hypertension: Secondary | ICD-10-CM

## 2017-04-22 DIAGNOSIS — I10 Essential (primary) hypertension: Secondary | ICD-10-CM

## 2017-04-22 DIAGNOSIS — J301 Allergic rhinitis due to pollen: Secondary | ICD-10-CM

## 2017-04-22 MED FILL — MONTELUKAST SOD 10 MG TAB: 10 | 30 days supply | Qty: 30 | Fill #0

## 2017-04-22 MED FILL — LISINOPRIL-HCTZ 20-12.5 MG: 20-12.5 | 90 days supply | Qty: 90 | Fill #0

## 2017-05-03 MED FILL — acetaZOLAMIDE 250 MG TABS: 250 | 90 days supply | Qty: 360 | Fill #3

## 2017-05-20 MED FILL — MONTELUKAST SOD 10 MG TAB: 10 | 30 days supply | Qty: 30 | Fill #1

## 2017-05-27 DIAGNOSIS — Z1231 Encounter for screening mammogram for malignant neoplasm of breast: Secondary | ICD-10-CM | POA: Diagnosis not present

## 2017-06-21 ENCOUNTER — Ambulatory Visit (INDEPENDENT_AMBULATORY_CARE_PROVIDER_SITE_OTHER): Payer: 59 | Admitting: Family

## 2017-06-21 ENCOUNTER — Encounter: Payer: Self-pay | Admitting: Family

## 2017-06-21 VITALS — BP 114/73 | HR 72 | Temp 98.0°F | Ht 66.0 in | Wt 241.8 lb

## 2017-06-21 DIAGNOSIS — I10 Essential (primary) hypertension: Secondary | ICD-10-CM | POA: Diagnosis not present

## 2017-06-21 DIAGNOSIS — R002 Palpitations: Secondary | ICD-10-CM

## 2017-06-21 DIAGNOSIS — E782 Mixed hyperlipidemia: Secondary | ICD-10-CM | POA: Diagnosis not present

## 2017-06-21 DIAGNOSIS — Z23 Encounter for immunization: Secondary | ICD-10-CM | POA: Diagnosis not present

## 2017-06-21 DIAGNOSIS — G932 Benign intracranial hypertension: Secondary | ICD-10-CM

## 2017-06-21 DIAGNOSIS — L719 Rosacea, unspecified: Secondary | ICD-10-CM | POA: Diagnosis not present

## 2017-06-21 DIAGNOSIS — J301 Allergic rhinitis due to pollen: Secondary | ICD-10-CM | POA: Diagnosis not present

## 2017-06-21 DIAGNOSIS — E669 Obesity, unspecified: Secondary | ICD-10-CM | POA: Diagnosis not present

## 2017-06-21 LAB — CMP14+EGFR
ALBUMIN: 4.4 g/dL (ref 3.5–5.5)
ALK PHOS: 41 IU/L (ref 39–117)
ALT: 11 IU/L (ref 0–32)
AST: 10 IU/L (ref 0–40)
Albumin/Globulin Ratio: 1.7 (ref 1.2–2.2)
BILIRUBIN TOTAL: 0.5 mg/dL (ref 0.0–1.2)
BUN / CREAT RATIO: 20 (ref 9–23)
BUN: 19 mg/dL (ref 6–24)
CHLORIDE: 105 mmol/L (ref 96–106)
CO2: 17 mmol/L — ABNORMAL LOW (ref 20–29)
CREATININE: 0.93 mg/dL (ref 0.57–1.00)
Calcium: 9.2 mg/dL (ref 8.7–10.2)
GFR calc non Af Amer: 75 mL/min/{1.73_m2} (ref >59)
GFR, EST AFRICAN AMERICAN: 86 mL/min/{1.73_m2} (ref >59)
GLOBULIN, TOTAL: 2.6 g/dL (ref 1.5–4.5)
Glucose: 81 mg/dL (ref 65–99)
Potassium: 3.3 mmol/L — ABNORMAL LOW (ref 3.5–5.2)
SODIUM: 136 mmol/L (ref 134–144)
TOTAL PROTEIN: 7 g/dL (ref 6.0–8.5)

## 2017-06-21 LAB — LIPID PANEL
CHOLESTEROL TOTAL: 154 mg/dL (ref 100–199)
Chol/HDL Ratio: 3.3 ratio (ref 0.0–4.4)
HDL: 47 mg/dL (ref >39)
LDL CALC: 87 mg/dL (ref 0–99)
Triglycerides: 100 mg/dL (ref 0–149)
VLDL CHOLESTEROL CAL: 20 mg/dL (ref 5–40)

## 2017-06-21 MED ORDER — METRONIDAZOLE 0.75 % EX CREA: TOPICAL_CREAM | Freq: Two times a day (BID) | CUTANEOUS | 1 refills | Status: DC

## 2017-06-21 MED FILL — metroNIDAZOLE 0.75 % CREA: 0.75 | 20 days supply | Qty: 45 | Fill #0

## 2017-06-21 NOTE — Progress Notes (Signed)
Subjective:    Patient ID: [name], female    DOB: 1971/08/06, 46 y.o.   MRN: [id number]  PT presents to the office today for chronic follow up. Pt has idiopathic intracranial hypertension and is followed by Dr. Leta Baptist, neurologists every 6 months.  Hypertension  This is a chronic problem. The current episode started more than 1 year ago. The problem has been resolved since onset. The problem is controlled. Pertinent negatives include no headaches, malaise/fatigue, peripheral edema or shortness of breath. Risk factors for coronary artery disease include dyslipidemia, obesity, post-menopausal state, sedentary lifestyle and family history. The current treatment provides moderate improvement. There is no history of kidney disease, CAD/MI, CVA or heart failure.  Hyperlipidemia  This is a chronic problem. The current episode started more than 1 year ago. The problem is controlled. Recent lipid tests were reviewed and are normal. Exacerbating diseases include obesity. Pertinent negatives include no shortness of breath. Current antihyperlipidemic treatment includes diet change. The current treatment provides mild improvement of lipids. Risk factors for coronary artery disease include dyslipidemia, family history, obesity and hypertension.  Rosacea    Pt has redness of bilateral cheeks and nose. Pt states she has never been diagnosed with rosacea, but her father and sister have been.  Palpitations Pt complaining of palpitations that started a few weeks ago that occur 1-2 times a week. PT states she can not pinpoint a cause and has limited her caffeine to 2 cups a day. PT does work night shift.     Review of Systems  Constitutional: Negative for malaise/fatigue.  Respiratory: Negative for shortness of breath.   Neurological: Negative for headaches.  All other systems reviewed and are negative.      Objective:   Physical Exam  Constitutional: She is oriented to person, place, and time.  She appears well-developed and well-nourished. No distress.  HENT:  Head: Normocephalic and atraumatic.  Right Ear: External ear normal.  Left Ear: External ear normal.  Nose: Nose normal.  Mouth/Throat: Oropharynx is clear and moist.  Bilateral cheeks erythemas and bridge of nose  Eyes: Pupils are equal, round, and reactive to light.  Neck: Normal range of motion. Neck supple. No thyromegaly present.  Cardiovascular: Normal rate, regular rhythm, normal heart sounds and intact distal pulses.   No murmur heard. Pulmonary/Chest: Effort normal and breath sounds normal. No respiratory distress. She has no wheezes.  Abdominal: Soft. Bowel sounds are normal. She exhibits no distension. There is no tenderness.  Musculoskeletal: Normal range of motion. She exhibits no edema or tenderness.  Neurological: She is alert and oriented to person, place, and time.  Skin: Skin is warm and dry.  Psychiatric: She has a normal mood and affect. Her behavior is normal. Judgment and thought content normal.  Vitals reviewed.   BP 114/73   Pulse 72   Temp 98 F (36.7 C) (Oral)   Ht '5\' 6"'$  (1.676 m)   Wt 241 lb 12.8 oz (109.7 kg)   BMI 39.03 kg/m      Assessment & Plan:  1. Essential hypertension - CMP14+EGFR  2. Mixed hyperlipidemia - CMP14+EGFR - Lipid panel  3. Idiopathic intracranial hypertension - CMP14+EGFR  4. Obesity (BMI 30-39.9) - CMP14+EGFR  5. Allergic rhinitis due to pollen, unspecified seasonality - CMP14+EGFR  6. Palpitations -Keep journal of causes If continues will need to do Holter monitor  - EKG 12-Lead - CMP14+EGFR  7. Rosacea Will start metronidazole cream today Avoid spicy foods, alcohol  - metroNIDAZOLE (METROCREAM)  0.75 % cream; Apply topically 2 (two) times daily.  Dispense: 45 g; Refill: 1 - CMP14+EGFR   Continue all meds Labs pending Health Maintenance reviewed-TDAP given today Diet and exercise encouraged RTO 4 months   [name], FNP

## 2017-06-21 NOTE — Addendum Note (Signed)
Addended by: Shelbie Ammons on: 06/21/2017 11:12 AM   Modules accepted: Orders

## 2017-06-21 NOTE — Patient Instructions (Addendum)
Palpitations A palpitation is the feeling that your heartbeat is irregular or is faster than normal. It may feel like your heart is fluttering or skipping a beat. Palpitations are usually not a serious problem. They may be caused by many things, including smoking, caffeine, alcohol, stress, and certain medicines. Although most causes of palpitations are not serious, palpitations can be a sign of a serious medical problem. In some cases, you may need further medical evaluation. Follow these instructions at home: Pay attention to any changes in your symptoms. Take these actions to help with your condition:  Avoid the following: ? Caffeinated coffee, tea, soft drinks, diet pills, and energy drinks. ? Chocolate. ? Alcohol.  Do not use any tobacco products, such as cigarettes, chewing tobacco, and e-cigarettes. If you need help quitting, ask your health care provider.  Try to reduce your stress and anxiety. Things that can help you relax include: ? Yoga. ? Meditation. ? Physical activity, such as swimming, jogging, or walking. ? Biofeedback. This is a method that helps you learn to use your mind to control things in your body, such as your heartbeats.  Get plenty of rest and sleep.  Take over-the-counter and prescription medicines only as told by your health care provider.  Keep all follow-up visits as told by your health care provider. This is important.  Contact a health care provider if:  You continue to have a fast or irregular heartbeat after 24 hours.  Your palpitations occur more often. Get help right away if:  You have chest pain or shortness of breath.  You have a severe headache.  You feel dizzy or you faint. This information is not intended to replace advice given to you by your health care provider. Make sure you discuss any questions you have with your health care provider. Document Released: 08/17/2000 Document Revised: 01/23/2016 Document Reviewed: 05/05/2015 Elsevier  Interactive Patient Education  2017 [employer].   Rosacea Rosacea is a long-term (chronic) condition that affects the skin of the face, including the cheeks, nose, brow, and chin. This condition can also affect the eyes. Rosacea causes blood vessels near the surface of the skin to enlarge, which results in redness. What are the causes? The cause of this condition is not known. Certain triggers can make rosacea worse, including:  Hot baths.  Exercise.  Sunlight.  Very hot or cold temperatures.  Hot or spicy foods and drinks.  Drinking alcohol.  Stress.  Taking blood pressure medicine.  Long-term use of topical steroids on the face.  What increases the risk? This condition is more likely to develop in:  People who are older than 44 years of age.  Women.  People who have light-colored skin (light complexion).  People who have a family history of rosacea.  What are the signs or symptoms? Symptoms of this condition include:  Redness of the face.  Red bumps or pimples on the face.  A red, enlarged nose.  Blushing easily.  Red lines on the skin.  Irritated or burning feeling in the eyes.  Swollen eyelids.  How is this diagnosed? This condition is diagnosed with a medical history and physical exam. How is this treated? There is no cure for this condition, but treatment can help to control your symptoms. Your health care provider may recommend that you see a skin specialist (dermatologist). Treatment may include:  Antibiotic medicines that are applied to the skin or taken as a pill.  Laser treatment to improve the appearance of the skin.  Surgery. This is rare.  Your health care provider will also recommend the best way to take care of your skin. Even after your skin improves, you will likely need to continue treatment to prevent your rosacea from coming back. Follow these instructions at home: Skin Care Take care of your skin as told by your health care  provider. You may be told to do these things:  Wash your skin gently two or more times each day.  Use mild soap.  Use a sunscreen or sunblock with SPF 30 or greater.  Use gentle cosmetics that are meant for sensitive skin.  Shave with an electric shaver instead of a blade.  Lifestyle  Try to keep track of what foods trigger this condition. Avoid any triggers. These may include: ? Spicy foods. ? Seafood. ? Cheese. ? Hot liquids. ? Nuts. ? Chocolate. ? Iodized salt.  Do not drink alcohol.  Avoid extremely cold or hot temperatures.  Try to reduce your stress. If you need help, talk with your health care provider.  When you exercise, do these things to stay cool: ? Limit your sun exposure. ? Use a fan. ? Do shorter and more frequent intervals of exercise. General instructions  Keep all follow-up visits as told by your health care provider. This is important.  Take over-the-counter and prescription medicines only as told by your health care provider.  If your eyelids are affected, apply warm compresses to them. Do this as told by your health care provider.  If you were prescribed an antibiotic medicine, apply or take it as told by your health care provider. Do not stop using the antibiotic even if your condition improves. Contact a health care provider if:  Your symptoms get worse.  Your symptoms do not improve after two months of treatment.  You have new symptoms.  You have any changes in vision or you have problems with your eyes, such as redness or itching.  You feel depressed.  You lose your appetite.  You have trouble concentrating. This information is not intended to replace advice given to you by your health care provider. Make sure you discuss any questions you have with your health care provider. Document Released: 09/27/2004 Document Revised: 01/26/2016 Document Reviewed: 10/27/2014 Elsevier Interactive Patient Education  [employer].

## 2017-06-23 MED FILL — MONTELUKAST SOD 10 MG TAB: 10 | 30 days supply | Qty: 30 | Fill #2

## 2017-06-25 ENCOUNTER — Other Ambulatory Visit: Payer: Self-pay | Admitting: Family

## 2017-06-25 DIAGNOSIS — E876 Hypokalemia: Secondary | ICD-10-CM

## 2017-06-25 MED ORDER — POTASSIUM CHLORIDE ER 10 MEQ PO TBCR: 10.0000 meq | EXTENDED_RELEASE_TABLET | Freq: Two times a day (BID) | ORAL | 3 refills | Status: DC

## 2017-06-25 MED FILL — POTASSIUM CL ER 10 MEQ TABL: 10 | 30 days supply | Qty: 60 | Fill #0

## 2017-07-15 ENCOUNTER — Other Ambulatory Visit: Payer: Self-pay | Admitting: Family

## 2017-07-15 DIAGNOSIS — G932 Benign intracranial hypertension: Secondary | ICD-10-CM

## 2017-07-15 DIAGNOSIS — I10 Essential (primary) hypertension: Secondary | ICD-10-CM

## 2017-07-15 MED FILL — LISINOPRIL-HCTZ 20-12.5 MG: 20-12.5 | 90 days supply | Qty: 90 | Fill #0

## 2017-07-22 ENCOUNTER — Other Ambulatory Visit: Payer: Self-pay | Admitting: Family

## 2017-07-22 DIAGNOSIS — J301 Allergic rhinitis due to pollen: Secondary | ICD-10-CM

## 2017-07-22 MED FILL — POTASSIUM CL ER 10 MEQ TABL: 10 | 30 days supply | Qty: 60 | Fill #1

## 2017-07-22 MED FILL — MONTELUKAST SOD 10 MG TAB: 10 | 30 days supply | Qty: 30 | Fill #0

## 2017-08-05 MED FILL — acetaZOLAMIDE 250 MG TABS: 250 | 90 days supply | Qty: 360 | Fill #4

## 2017-08-18 MED FILL — POTASSIUM CL ER 10 MEQ TABL: 10 | 30 days supply | Qty: 60 | Fill #2

## 2017-08-18 MED FILL — MONTELUKAST SOD 10 MG TAB: 10 | 30 days supply | Qty: 30 | Fill #1

## 2017-09-22 MED FILL — POTASSIUM CL ER 10 MEQ TABL: 10 | 30 days supply | Qty: 60 | Fill #3

## 2017-09-22 MED FILL — MONTELUKAST SOD 10 MG TAB: 10 | 30 days supply | Qty: 30 | Fill #2

## 2017-09-24 DIAGNOSIS — H524 Presbyopia: Secondary | ICD-10-CM | POA: Diagnosis not present

## 2017-09-24 DIAGNOSIS — H5213 Myopia, bilateral: Secondary | ICD-10-CM | POA: Diagnosis not present

## 2017-09-24 DIAGNOSIS — H52223 Regular astigmatism, bilateral: Secondary | ICD-10-CM | POA: Diagnosis not present

## 2017-10-17 ENCOUNTER — Other Ambulatory Visit: Payer: Self-pay | Admitting: Family

## 2017-10-17 DIAGNOSIS — G932 Benign intracranial hypertension: Secondary | ICD-10-CM

## 2017-10-17 DIAGNOSIS — I10 Essential (primary) hypertension: Secondary | ICD-10-CM

## 2017-10-17 MED FILL — LISINOPRIL-HCTZ 20-12.5 MG: 20-12.5 | 90 days supply | Qty: 90 | Fill #0

## 2017-10-20 MED FILL — MONTELUKAST SOD 10 MG TAB: 10 | 30 days supply | Qty: 30 | Fill #3

## 2017-10-21 ENCOUNTER — Other Ambulatory Visit: Payer: Self-pay | Admitting: Family

## 2017-10-21 MED FILL — POTASSIUM CL ER 10 MEQ TABL: 10 | 30 days supply | Qty: 60 | Fill #0

## 2017-10-28 ENCOUNTER — Other Ambulatory Visit: Payer: Self-pay | Admitting: Diagnostic Neuroimaging

## 2017-11-05 ENCOUNTER — Encounter: Payer: Self-pay | Admitting: Diagnostic Neuroimaging

## 2017-11-05 ENCOUNTER — Ambulatory Visit: Payer: 59 | Admitting: Diagnostic Neuroimaging

## 2017-11-05 VITALS — BP 130/75 | HR 85 | Ht 66.0 in | Wt 248.8 lb

## 2017-11-05 DIAGNOSIS — G932 Benign intracranial hypertension: Secondary | ICD-10-CM | POA: Diagnosis not present

## 2017-11-05 DIAGNOSIS — Z6841 Body Mass Index (BMI) 40.0 and over, adult: Secondary | ICD-10-CM

## 2017-11-05 MED ORDER — ACETAZOLAMIDE 250 MG PO TABS: 500.0000 mg | ORAL_TABLET | Freq: Two times a day (BID) | ORAL | 4 refills | Status: DC

## 2017-11-05 MED FILL — acetaZOLAMIDE 250 MG TABS: 250 | 90 days supply | Qty: 360 | Fill #0
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
## 2017-11-24 MED FILL — MONTELUKAST SOD 10 MG TAB: 10 | 30 days supply | Qty: 30 | Fill #4

## 2017-11-25 ENCOUNTER — Other Ambulatory Visit: Payer: Self-pay | Admitting: Family

## 2017-11-25 MED FILL — POTASSIUM CL ER 10 MEQ TABL: 10 | 30 days supply | Qty: 60 | Fill #0

## 2017-11-25 NOTE — Telephone Encounter (Signed)
Last seen 06/21/17  [facility]

## 2017-11-30 ENCOUNTER — Ambulatory Visit (INDEPENDENT_AMBULATORY_CARE_PROVIDER_SITE_OTHER): Payer: 59 | Admitting: Family Medicine

## 2017-11-30 ENCOUNTER — Encounter: Payer: Self-pay | Admitting: Family Medicine

## 2017-11-30 VITALS — BP 136/79 | HR 76 | Temp 97.6°F | Ht 66.0 in | Wt 245.4 lb

## 2017-11-30 DIAGNOSIS — R05 Cough: Secondary | ICD-10-CM | POA: Diagnosis not present

## 2017-11-30 DIAGNOSIS — R591 Generalized enlarged lymph nodes: Secondary | ICD-10-CM

## 2017-11-30 DIAGNOSIS — R059 Cough, unspecified: Secondary | ICD-10-CM

## 2017-11-30 MED ORDER — AMOXICILLIN-POT CLAVULANATE 875-125 MG PO TABS: 1.0000 | ORAL_TABLET | Freq: Two times a day (BID) | ORAL | 0 refills | Status: DC

## 2017-11-30 NOTE — Progress Notes (Signed)
   HPI  Patient presents today with illness and now with swollen lymph node.  Patient states for the last week or so she has had sinus pain and pressure, congestion, and cough productive of thick sputum.  She states that she actually feels that she is improving from that.  However last night she developed the right sided submandibular lymph node that was very tender, it improved slightly throughout the night and then got worse again this morning.  She denies fever, chills, sweats.  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    PMH: Smoking status noted ROS: Per HPI  Objective: BP 136/79   Pulse 76   Temp 97.6 F (36.4 C) (Oral)   Ht 5\' 6"  (1.676 m)   Wt 245 lb 6.4 oz (111.3 kg)   BMI 39.61 kg/m  Gen: NAD, alert, cooperative with exam HEENT: NCAT, enlarged tonsils bilaterally with white exudates, oropharynx moist CV: RRR, good S1/S2, no murmur Resp: Unlabored, right upper and lower lung fields with expiratory coarse sounds Abd: SNTND, BS present, no guarding or organomegaly Ext: No edema, warm Neuro: Alert and oriented, No gross deficits  Assessment and plan:  #Cough, lymphadenopathy Patient with resolving acute illness, however new onset right-sided lymphadenopathy On exam she is significant for enlarged tonsils with exudates, I will go ahead and cover for strep pharyngitis. She also has some coarse breath sounds so I broadened coverage slightly with Augmentin.   Meds ordered this encounter  Medications  . amoxicillin-clavulanate (AUGMENTIN) 875-125 MG tablet    Sig: Take 1 tablet by mouth 2 (two) times daily.    Dispense:  20 tablet    Refill:  0    Laroy Apple, MD [facility] 11/30/2017, 9:12 AM

## 2017-11-30 NOTE — Patient Instructions (Signed)
Great to meet you! 

## 2017-12-23 ENCOUNTER — Other Ambulatory Visit: Payer: Self-pay | Admitting: Family

## 2017-12-23 DIAGNOSIS — J301 Allergic rhinitis due to pollen: Secondary | ICD-10-CM

## 2017-12-23 MED FILL — POTASSIUM CL 10 MEQ TAB SA: 10 | 30 days supply | Qty: 60 | Fill #0

## 2017-12-23 MED FILL — MONTELUKAST SOD 10 MG TAB: 10 | 30 days supply | Qty: 30 | Fill #0

## 2018-01-13 ENCOUNTER — Other Ambulatory Visit: Payer: Self-pay | Admitting: Family

## 2018-01-13 DIAGNOSIS — I10 Essential (primary) hypertension: Secondary | ICD-10-CM

## 2018-01-13 DIAGNOSIS — G932 Benign intracranial hypertension: Secondary | ICD-10-CM

## 2018-01-13 MED FILL — LISINOPRIL-HCTZ 20-12.5 MG: 20-12.5 | 90 days supply | Qty: 90 | Fill #0

## 2018-01-20 ENCOUNTER — Other Ambulatory Visit: Payer: Self-pay | Admitting: Family

## 2018-01-20 MED FILL — MONTELUKAST SOD 10 MG TAB: 10 | 30 days supply | Qty: 30 | Fill #1

## 2018-01-20 MED FILL — POTASSIUM CL 10 MEQ TAB SA: 10 | 30 days supply | Qty: 60 | Fill #0

## 2018-02-03 MED FILL — acetaZOLAMIDE 250 MG TABS: 250 | 90 days supply | Qty: 360 | Fill #1

## 2018-02-23 MED FILL — MONTELUKAST SOD 10 MG TAB: 10 | 30 days supply | Qty: 30 | Fill #2

## 2018-02-24 MED FILL — POTASSIUM CL ER 10 MEQ TABL: 10 | 30 days supply | Qty: 60 | Fill #1

## 2018-03-14 MED FILL — metroNIDAZOLE 0.75 % CREA: 0.75 | 20 days supply | Qty: 45 | Fill #1

## 2018-03-30 MED FILL — POTASSIUM CL ER 10 MEQ TABL: 10 | 30 days supply | Qty: 60 | Fill #2

## 2018-03-31 MED FILL — MONTELUKAST SOD 10 MG TAB: 10 | 30 days supply | Qty: 30 | Fill #3

## 2018-04-14 ENCOUNTER — Other Ambulatory Visit: Payer: Self-pay | Admitting: Family

## 2018-04-14 DIAGNOSIS — I10 Essential (primary) hypertension: Secondary | ICD-10-CM

## 2018-04-14 DIAGNOSIS — G932 Benign intracranial hypertension: Secondary | ICD-10-CM

## 2018-04-14 MED FILL — LISINOPRIL-HCTZ 20-12.5 TAB: 20-12.5 | 90 days supply | Qty: 90 | Fill #0

## 2018-04-28 MED FILL — MONTELUKAST SOD 10 MG TAB: 10 | 30 days supply | Qty: 30 | Fill #4

## 2018-04-28 MED FILL — POTASSIUM CHL ER M10 TABLET: 10 | 30 days supply | Qty: 60 | Fill #3

## 2018-05-06 MED FILL — acetaZOLAMIDE 250 MG TABS: 250 | 90 days supply | Qty: 360 | Fill #2

## 2018-06-02 ENCOUNTER — Other Ambulatory Visit: Payer: Self-pay | Admitting: Family

## 2018-06-02 DIAGNOSIS — J301 Allergic rhinitis due to pollen: Secondary | ICD-10-CM

## 2018-06-03 MED FILL — POTASSIUM CHL ER M10 TABLET: 10 | 30 days supply | Qty: 60 | Fill #0

## 2018-06-03 MED FILL — MONTELUKAST SOD 10 MG TAB: 10 | 30 days supply | Qty: 30 | Fill #0

## 2018-06-03 NOTE — Telephone Encounter (Signed)
Last seen 11/30/17   Dr Wendi Snipes

## 2018-07-07 ENCOUNTER — Other Ambulatory Visit: Payer: Self-pay | Admitting: Physician Assistant

## 2018-07-07 DIAGNOSIS — J301 Allergic rhinitis due to pollen: Secondary | ICD-10-CM

## 2018-07-08 ENCOUNTER — Ambulatory Visit: Payer: 59 | Admitting: Diagnostic Neuroimaging

## 2018-07-08 ENCOUNTER — Encounter: Payer: Self-pay | Admitting: Diagnostic Neuroimaging

## 2018-07-08 VITALS — BP 142/78 | HR 84 | Ht 66.0 in | Wt 256.8 lb

## 2018-07-08 DIAGNOSIS — Z6841 Body Mass Index (BMI) 40.0 and over, adult: Secondary | ICD-10-CM | POA: Diagnosis not present

## 2018-07-08 DIAGNOSIS — H4710 Unspecified papilledema: Secondary | ICD-10-CM | POA: Diagnosis not present

## 2018-07-08 DIAGNOSIS — G932 Benign intracranial hypertension: Secondary | ICD-10-CM | POA: Diagnosis not present

## 2018-07-08 MED ORDER — ACETAZOLAMIDE 250 MG PO TABS: 500.0000 mg | ORAL_TABLET | Freq: Two times a day (BID) | ORAL | 4 refills | Status: DC

## 2018-07-08 MED ORDER — MONTELUKAST SODIUM 10 MG PO TABS: 10.0000 mg | ORAL_TABLET | Freq: Every day | ORAL | 0 refills | Status: DC

## 2018-07-08 MED ORDER — POTASSIUM CHLORIDE CRYS ER 10 MEQ PO TBCR: EXTENDED_RELEASE_TABLET | ORAL | 0 refills | Status: DC

## 2018-07-08 MED FILL — MONTELUKAST SOD 10 MG TAB: 10 | 30 days supply | Qty: 30 | Fill #0

## 2018-07-08 MED FILL — POTASSIUM CHL ER M10 TABLET: 10 | 30 days supply | Qty: 60 | Fill #0

## 2018-07-08 NOTE — Patient Instructions (Signed)
idiopathic intracranial hypertension (pseudotumor cerebri) - stable - continue acetazolamide 500mg  twice a day - follow up with eye clinic (Dr. Rona Ravens for serial eye exams)  obesity (BMI > 40) - encouraged slow, gradual weight loss and use of fitness tracker - refer to health weight mgmt clinic

## 2018-07-08 NOTE — Telephone Encounter (Signed)
[facility]. NTBS 30 days given 06/03/18

## 2018-07-08 NOTE — Telephone Encounter (Signed)
Prescription sent to pharmacy.

## 2018-07-08 NOTE — Telephone Encounter (Signed)
Patient aware.
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
## 2018-07-08 NOTE — Telephone Encounter (Signed)
Has apt 11/25 wanting to know if she can get refill until apt? Please advise

## 2018-07-08 NOTE — Addendum Note (Signed)
Addended by: [name] A on: 07/08/2018 11:29 AM   Modules accepted: Orders

## 2018-07-21 ENCOUNTER — Other Ambulatory Visit: Payer: Self-pay | Admitting: Family

## 2018-07-21 DIAGNOSIS — G932 Benign intracranial hypertension: Secondary | ICD-10-CM

## 2018-07-21 DIAGNOSIS — I10 Essential (primary) hypertension: Secondary | ICD-10-CM

## 2018-07-22 MED FILL — LISINOPRIL-HCTZ 20-12.5 TAB: 20-12.5 | 90 days supply | Qty: 90 | Fill #0

## 2018-07-28 ENCOUNTER — Ambulatory Visit (INDEPENDENT_AMBULATORY_CARE_PROVIDER_SITE_OTHER): Payer: 59 | Admitting: Family

## 2018-07-28 ENCOUNTER — Encounter: Payer: Self-pay | Admitting: Family

## 2018-07-28 VITALS — BP 118/80 | HR 86 | Temp 98.3°F | Ht 66.0 in | Wt 252.0 lb

## 2018-07-28 DIAGNOSIS — J4520 Mild intermittent asthma, uncomplicated: Secondary | ICD-10-CM | POA: Diagnosis not present

## 2018-07-28 DIAGNOSIS — I10 Essential (primary) hypertension: Secondary | ICD-10-CM

## 2018-07-28 DIAGNOSIS — J301 Allergic rhinitis due to pollen: Secondary | ICD-10-CM | POA: Diagnosis not present

## 2018-07-28 DIAGNOSIS — Z0000 Encounter for general adult medical examination without abnormal findings: Secondary | ICD-10-CM

## 2018-07-28 DIAGNOSIS — L719 Rosacea, unspecified: Secondary | ICD-10-CM | POA: Diagnosis not present

## 2018-07-28 DIAGNOSIS — E782 Mixed hyperlipidemia: Secondary | ICD-10-CM

## 2018-07-28 DIAGNOSIS — J45909 Unspecified asthma, uncomplicated: Secondary | ICD-10-CM | POA: Insufficient documentation

## 2018-07-28 DIAGNOSIS — E669 Obesity, unspecified: Secondary | ICD-10-CM

## 2018-07-28 DIAGNOSIS — G932 Benign intracranial hypertension: Secondary | ICD-10-CM

## 2018-07-28 MED ORDER — METRONIDAZOLE 0.75 % EX CREA: TOPICAL_CREAM | Freq: Two times a day (BID) | CUTANEOUS | 1 refills | Status: DC

## 2018-07-28 MED ORDER — POTASSIUM CHLORIDE CRYS ER 10 MEQ PO TBCR: EXTENDED_RELEASE_TABLET | ORAL | 6 refills | Status: DC

## 2018-07-28 MED ORDER — MONTELUKAST SODIUM 10 MG PO TABS: 10.0000 mg | ORAL_TABLET | Freq: Every day | ORAL | 3 refills | Status: DC

## 2018-07-28 MED ORDER — LISINOPRIL-HYDROCHLOROTHIAZIDE 20-12.5 MG PO TABS: 1.0000 | ORAL_TABLET | Freq: Every day | ORAL | 3 refills | Status: DC

## 2018-07-28 MED FILL — metroNIDAZOLE 0.75 % CREA: 0.75 | 20 days supply | Qty: 45 | Fill #0

## 2018-07-28 NOTE — Progress Notes (Signed)
Subjective:    Patient ID: [name], female    DOB: [birth year], 47 y.o.   MRN: [id number]  Chief Complaint  Patient presents with  . Gynecologic Exam    pap   Pt presents to the office today for CPE without pap. Pt had Pap 12/2016 that was normal with negative HPV. . Pt has idiopathic intracranial hypertension and is followed by Dr. Leta Baptist, neurologists every 73month. Hypertension  This is a chronic problem. The current episode started more than 1 year ago. The problem has been resolved since onset. The problem is controlled. Pertinent negatives include no malaise/fatigue or peripheral edema. Agents associated with hypertension include NSAIDs. Risk factors for coronary artery disease include obesity, dyslipidemia and sedentary lifestyle. The current treatment provides moderate improvement. There is no history of kidney disease, CVA or heart failure.  Asthma  She complains of cough. There is no frequent throat clearing or wheezing. This is a chronic problem. The current episode started more than 1 year ago. The problem occurs intermittently. Pertinent negatives include no malaise/fatigue. Her symptoms are alleviated by rest. Her past medical history is significant for asthma.  Rosacea PT uses metrocream as needed. Tries to avoid triggers.    Review of Systems  Constitutional: Negative for malaise/fatigue.  Respiratory: Positive for cough. Negative for wheezing.   All other systems reviewed and are negative.      Objective:   Physical Exam  Constitutional: She is oriented to person, place, and time. She appears well-developed and well-nourished. No distress.  HENT:  Head: Normocephalic and atraumatic.  Right Ear: External ear normal.  Left Ear: External ear normal.  Mouth/Throat: Oropharynx is clear and moist.  Eyes: Pupils are equal, round, and reactive to light.  Neck: Normal range of motion. Neck supple. No thyromegaly present.  Cardiovascular: Normal rate, regular  rhythm, normal heart sounds and intact distal pulses.  No murmur heard. Pulmonary/Chest: Effort normal and breath sounds normal. No respiratory distress. She has no wheezes.  Abdominal: Soft. Bowel sounds are normal. She exhibits no distension. There is no tenderness.  Musculoskeletal: Normal range of motion. She exhibits no edema or tenderness.  Neurological: She is alert and oriented to person, place, and time. She has normal reflexes. No cranial nerve deficit.  Skin: Skin is warm and dry.  Psychiatric: She has a normal mood and affect. Her behavior is normal. Judgment and thought content normal.  Vitals reviewed.   BP 118/80   Pulse 86   Temp 98.3 F (36.8 C) (Oral)   Ht '5\' 6"'$  (1.676 m)   Wt 252 lb (114.3 kg)   LMP 07/16/2018   BMI 40.67 kg/m      Assessment & Plan:  LALZENA GERBERcomes in today with chief complaint of Annual Exam (pap)   Diagnosis and orders addressed:  1. Annual physical exam - CMP14+EGFR - CBC with Differential/Platelet - Lipid panel - TSH  2. Essential hypertension - CMP14+EGFR - CBC with Differential/Platelet - lisinopril-hydrochlorothiazide (PRINZIDE,ZESTORETIC) 20-12.5 MG tablet; Take 1 tablet by mouth daily.  Dispense: 90 tablet; Refill: 3  3. Allergic rhinitis due to pollen, unspecified seasonality - CMP14+EGFR - CBC with Differential/Platelet - montelukast (SINGULAIR) 10 MG tablet; Take 1 tablet (10 mg total) by mouth at bedtime.  Dispense: 90 tablet; Refill: 3  4. Mixed hyperlipidemia - CMP14+EGFR - CBC with Differential/Platelet - Lipid panel  5. Obesity (BMI 30-39.9) - CMP14+EGFR - CBC with Differential/Platelet  6. Mild intermittent asthma without complication - COIN86+VEHM- CBC with  Differential/Platelet - montelukast (SINGULAIR) 10 MG tablet; Take 1 tablet (10 mg total) by mouth at bedtime.  Dispense: 90 tablet; Refill: 3  7. Rosacea - CMP14+EGFR - CBC with Differential/Platelet - metroNIDAZOLE (METROCREAM) 0.75 %  cream; Apply topically 2 (two) times daily.  Dispense: 45 g; Refill: 1  8. Idiopathic intracranial hypertension - lisinopril-hydrochlorothiazide (PRINZIDE,ZESTORETIC) 20-12.5 MG tablet; Take 1 tablet by mouth daily.  Dispense: 90 tablet; Refill: 3   Labs pending Health Maintenance reviewed Diet and exercise encouraged  Follow up plan: 1 year  [name], FNP

## 2018-07-28 NOTE — Patient Instructions (Signed)

## 2018-07-29 LAB — CBC WITH DIFFERENTIAL/PLATELET
BASOS ABS: 0 10*3/uL (ref 0.0–0.2)
Basos: 1 %
EOS (ABSOLUTE): 0.4 10*3/uL (ref 0.0–0.4)
Eos: 5 %
Hematocrit: 42.3 % (ref 34.0–46.6)
Hemoglobin: 14.2 g/dL (ref 11.1–15.9)
Immature Grans (Abs): 0 10*3/uL (ref 0.0–0.1)
Immature Granulocytes: 0 %
Lymphocytes Absolute: 1.9 10*3/uL (ref 0.7–3.1)
Lymphs: 27 %
MCH: 30.9 pg (ref 26.6–33.0)
MCHC: 33.6 g/dL (ref 31.5–35.7)
MCV: 92 fL (ref 79–97)
Monocytes Absolute: 0.4 10*3/uL (ref 0.1–0.9)
Monocytes: 5 %
NEUTROS ABS: 4.5 10*3/uL (ref 1.4–7.0)
Neutrophils: 62 %
PLATELETS: 243 10*3/uL (ref 150–450)
RBC: 4.59 x10E6/uL (ref 3.77–5.28)
RDW: 12 % — AB (ref 12.3–15.4)
WBC: 7.2 10*3/uL (ref 3.4–10.8)

## 2018-07-29 LAB — LIPID PANEL
CHOLESTEROL TOTAL: 186 mg/dL (ref 100–199)
Chol/HDL Ratio: 3.6 ratio (ref 0.0–4.4)
HDL: 51 mg/dL (ref >39)
LDL Calculated: 110 mg/dL — ABNORMAL HIGH (ref 0–99)
Triglycerides: 124 mg/dL (ref 0–149)
VLDL Cholesterol Cal: 25 mg/dL (ref 5–40)

## 2018-07-29 LAB — CMP14+EGFR
A/G RATIO: 1.6 (ref 1.2–2.2)
ALBUMIN: 4.3 g/dL (ref 3.5–5.5)
ALT: 22 IU/L (ref 0–32)
AST: 11 IU/L (ref 0–40)
Alkaline Phosphatase: 38 IU/L — ABNORMAL LOW (ref 39–117)
BILIRUBIN TOTAL: 0.5 mg/dL (ref 0.0–1.2)
BUN / CREAT RATIO: 17 (ref 9–23)
BUN: 16 mg/dL (ref 6–24)
CHLORIDE: 109 mmol/L — AB (ref 96–106)
CO2: 16 mmol/L — ABNORMAL LOW (ref 20–29)
Calcium: 9.3 mg/dL (ref 8.7–10.2)
Creatinine, Ser: 0.92 mg/dL (ref 0.57–1.00)
GFR calc non Af Amer: 75 mL/min/{1.73_m2} (ref >59)
GFR, EST AFRICAN AMERICAN: 87 mL/min/{1.73_m2} (ref >59)
GLUCOSE: 78 mg/dL (ref 65–99)
Globulin, Total: 2.7 g/dL (ref 1.5–4.5)
POTASSIUM: 3.5 mmol/L (ref 3.5–5.2)
Sodium: 141 mmol/L (ref 134–144)
TOTAL PROTEIN: 7 g/dL (ref 6.0–8.5)

## 2018-07-29 LAB — TSH: TSH: 2.45 u[IU]/mL (ref 0.450–4.500)

## 2018-08-01 MED FILL — MONTELUKAST SOD 10 MG TAB: 10 | 90 days supply | Qty: 90 | Fill #0

## 2018-08-04 MED FILL — acetaZOLAMIDE 250 MG TABS: 250 | 90 days supply | Qty: 360 | Fill #3

## 2018-08-04 MED FILL — POTASSIUM CHL ER M10 TABLET: 10 | 30 days supply | Qty: 60 | Fill #0

## 2018-08-19 ENCOUNTER — Encounter (INDEPENDENT_AMBULATORY_CARE_PROVIDER_SITE_OTHER): Payer: 59

## 2018-08-29 DIAGNOSIS — Z1231 Encounter for screening mammogram for malignant neoplasm of breast: Secondary | ICD-10-CM | POA: Diagnosis not present

## 2018-09-07 MED FILL — POTASSIUM CHL ER M10 TABLET: 10 | 30 days supply | Qty: 60 | Fill #1

## 2018-09-08 ENCOUNTER — Encounter (INDEPENDENT_AMBULATORY_CARE_PROVIDER_SITE_OTHER): Payer: Self-pay | Admitting: Bariatrics

## 2018-09-08 ENCOUNTER — Ambulatory Visit (INDEPENDENT_AMBULATORY_CARE_PROVIDER_SITE_OTHER): Payer: 59 | Admitting: Bariatrics

## 2018-09-08 VITALS — BP 129/82 | HR 77 | Temp 97.6°F | Ht 64.0 in | Wt 250.0 lb

## 2018-09-08 DIAGNOSIS — Z0289 Encounter for other administrative examinations: Secondary | ICD-10-CM

## 2018-09-08 DIAGNOSIS — R5383 Other fatigue: Secondary | ICD-10-CM

## 2018-09-08 DIAGNOSIS — R0602 Shortness of breath: Secondary | ICD-10-CM

## 2018-09-08 DIAGNOSIS — Z1331 Encounter for screening for depression: Secondary | ICD-10-CM | POA: Diagnosis not present

## 2018-09-08 DIAGNOSIS — G932 Benign intracranial hypertension: Secondary | ICD-10-CM

## 2018-09-08 DIAGNOSIS — E559 Vitamin D deficiency, unspecified: Secondary | ICD-10-CM

## 2018-09-08 DIAGNOSIS — Z9189 Other specified personal risk factors, not elsewhere classified: Secondary | ICD-10-CM | POA: Diagnosis not present

## 2018-09-08 DIAGNOSIS — I10 Essential (primary) hypertension: Secondary | ICD-10-CM | POA: Diagnosis not present

## 2018-09-08 DIAGNOSIS — Z6839 Body mass index (BMI) 39.0-39.9, adult: Secondary | ICD-10-CM | POA: Insufficient documentation

## 2018-09-08 DIAGNOSIS — Z6841 Body Mass Index (BMI) 40.0 and over, adult: Secondary | ICD-10-CM | POA: Diagnosis not present

## 2018-09-08 NOTE — Progress Notes (Signed)
Office: [phone]  /  Fax: [phone]   Dear Dr. Leta Baptist,   Thank you for referring [name] to our clinic. The following note includes my evaluation and treatment recommendations.  HPI:   Chief Complaint: OBESITY    [name] has been referred by Earlean Polka. Penumalli, MD (neurologist) for consultation regarding her obesity and obesity related comorbidities.    [name] (MR# [id number]) is a 48 y.o. female who presents on 09/08/2018 for obesity evaluation and treatment. Current BMI is Body mass index is 42.91 kg/m.[name] has been struggling with her weight for many years and has been unsuccessful in either losing weight, maintaining weight loss, or reaching her healthy weight goal.     [name] attended our information session and states she is currently in the action stage of change and ready to dedicate time achieving and maintaining a healthier weight. [name] is interested in becoming our patient and working on intensive lifestyle modifications including (but not limited to) diet, exercise and weight loss.    [name] states her family eats meals together she thinks her family will eat healthier with  her she struggles with family and or coworkers weight loss sabotage her desired weight loss is 82 to 85 lbs she started gaining weight after bypass surgery in 1997 her heaviest weight ever was 270 lbs. she has significant food cravings pasta and sodium rich foods she skips meals frequently she is frequently drinking liquids with calories she frequently makes poor food choices she frequently eats larger portions than normal  she has binge eating behaviors she struggles with emotional eating  she states her biggest concern as, "eating until her plate is empty"   Fatigue [name] feels her energy is lower than it should be. This has worsened with weight gain and has not worsened recently. Jennet admits to daytime somnolence and she admits to waking up still tired. Patient  had a negative sleep study . Patent has a history of symptoms of daytime fatigue, morning fatigue and hypertension. Patient generally gets 5 hours of sleep per night, and states they generally have restful sleep. Snoring is present. Apneic episodes are not present. Epworth Sleepiness Score is 3  Dyspnea on exertion Evea notes increasing shortness of breath with certain activities (climbing stairs and walking fast) and seems to be worsening over time with weight gain. She notes getting out of breath sooner with activity than she used to. This has not gotten worse recently. [name] has a new diagnosis of asthma. [name] denies orthopnea.  IC Intracranial Hypertension [name] has a diagnosis of idiopathic IC hypertension. She has a history of Chiari malformation and subclavian bypass in 1997. She is currently taking Diomox. She has no history of shunt.  Hypertension [name] is a 48 y.o. female with hypertension. She is currently taking Lisinopril-HCTZ. [name] denies chest pain. She is working weight loss to help control her blood pressure with the goal of decreasing her risk of heart attack and stroke. [name] blood pressure is well controlled.  At risk for cardiovascular disease [name] is at a higher than average risk for cardiovascular disease due to obesity and hypertension. She currently denies any chest pain.  Vitamin D deficiency [name] has a diagnosis of vitamin D deficiency. She is currently taking vit D and calcium. [name] denies nausea, vomiting or muscle weakness.  Depression Screen [name]'s Food and Mood (modified PHQ-9) score was  Depression screen PHQ 2/9 09/08/2018  Decreased Interest 2  Down, Depressed, Hopeless  1  PHQ - 2 Score 3  Altered sleeping 0  Tired, decreased energy 1  Change in appetite 1  Feeling bad or failure about yourself  1  Trouble concentrating 0  Moving slowly or fidgety/restless 0  Suicidal thoughts 0  PHQ-9 Score 6  Difficult doing work/chores Not  difficult at all    ASSESSMENT AND PLAN:  Other fatigue - Plan: EKG 12-Lead, Hemoglobin A1c, Insulin, random  Shortness of breath on exertion  Idiopathic intracranial hypertension - Plan: Comprehensive metabolic panel  Essential hypertension  Vitamin D deficiency - Plan: VITAMIN D 25 Hydroxy (Vit-D Deficiency, Fractures)  Depression screening  At risk for heart disease  Class 3 severe obesity with serious comorbidity and body mass index (BMI) of 40.0 to 44.9 in adult, unspecified obesity type (HCC)  PLAN:  Fatigue [name] was informed that her fatigue may be related to obesity, depression or many other causes. Labs will be ordered, and in the meanwhile [name] has agreed to work on diet, exercise and weight loss to help with fatigue. Proper sleep hygiene was discussed including the need for 7-8 hours of quality sleep each night. A sleep study was not ordered based on symptoms and Epworth score.  Dyspnea on exertion [name]'s shortness of breath appears to be obesity related and exercise induced. She has agreed to work on weight loss and gradually increase exercise to treat her exercise induced shortness of breath. If [name] follows our instructions and loses weight without improvement of her shortness of breath, we will plan to refer to pulmonology. We will monitor this condition regularly. [name] agrees to this plan. [name] will use her inhaler if needed.  IC Intracranial Hypertension [name] will continue to see the endocrinologist. She will continue to lose weight and will follow up with our clinic in 2 weeks.  Hypertension We discussed sodium restriction, working on healthy weight loss, and a regular exercise program as the means to achieve improved blood pressure control. [name] agreed with this plan and agreed to follow up as directed. We will continue to monitor her blood pressure as well as her progress with the above lifestyle modifications. She will continue her medications as prescribed and  will watch for signs of hypotension as she continues her lifestyle modifications.  Cardiovascular risk counseling [name] was given extended (15 minutes) coronary artery disease prevention counseling today. She is 48 y.o. female and has risk factors for heart disease including obesity, and hypertension. We discussed intensive lifestyle modifications today with an emphasis on specific weight loss instructions and strategies. Pt was also informed of the importance of increasing exercise and decreasing saturated fats to help prevent heart disease.  Vitamin D Deficiency Wilena was informed that low vitamin D levels contributes to fatigue and are associated with obesity, breast, and colon cancer. She will continue to take vitamin D and calcium and will follow up for routine testing of vitamin D, at least 2-3 times per year. She was informed of the risk of over-replacement of vitamin D and agrees to not increase her dose unless she discusses this with [name] first. We will check vitamin D level today and Gianelle will follow up at the agreed upon time.  Depression Screen Karron had a mildly positive depression screening. Depression is commonly associated with obesity and often results in emotional eating behaviors. We will monitor this closely and work on CBT to help improve the non-hunger eating patterns. Referral to Psychology may be required if no improvement is seen as she continues in our  clinic.  Obesity Kareli is currently in the action stage of change and her goal is to continue with weight loss efforts. I recommend Kayleah begin the structured treatment plan as follows:  She has agreed to follow the Category 3 plan Latajah has been instructed to eventually work up to a goal of 150 minutes of combined cardio and strengthening exercise per week for weight loss and overall health benefits. We discussed the following Behavioral Modification Strategies today: increase H2O intake, keeping healthy foods in the home, increasing  lean protein intake, decreasing simple carbohydrates, increasing vegetables and work on more meal planning and easy cooking plans   She was informed of the importance of frequent follow up visits to maximize her success with intensive lifestyle modifications for her multiple health conditions. She was informed we would discuss her lab results at her next visit unless there is a critical issue that needs to be addressed sooner. Gracelee agreed to keep her next visit at the agreed upon time to discuss these results.  ALLERGIES: Allergies  Allergen Reactions  . Codeine Other (See Comments)    "whole body turned bright red, swelled a little" with V codiene  . Lyrica [Pregabalin] Other (See Comments)    "suicidal thoughts"  . Nyquil Multi-Symptom [Pseudoeph-Doxylamine-Dm-Apap] Other (See Comments)    Turns skin red *all over body*, pt reports getting very hot   . Sulfa Antibiotics Other (See Comments)    "body turned red"    MEDICATIONS: Current Outpatient Medications on File Prior to Visit  Medication Sig Dispense Refill  . acetaZOLAMIDE (DIAMOX) 250 MG tablet Take 2 tablets (500 mg total) by mouth 2 (two) times daily. 360 tablet 4  . Calcium Carbonate-Vitamin D (CALCIUM + D PO) Take 1 tablet by mouth daily. Calcium 600 mg, vit D3 300 mg    . cetirizine (ZYRTEC) 5 MG tablet Take 5 mg by mouth daily.    . Cyanocobalamin (VITAMIN B 12 PO) Take 5,000 mcg by mouth daily.     . Echinacea 125 MG CAPS Take 125 mg by mouth daily.     . Flax Oil-Fish Oil-Borage Oil (FISH OIL-FLAX OIL-BORAGE OIL) CAPS Take 2 capsules by mouth daily.     Marland Kitchen lisinopril-hydrochlorothiazide (PRINZIDE,ZESTORETIC) 20-12.5 MG tablet Take 1 tablet by mouth daily. 90 tablet 3  . Magnesium 250 MG TABS Take 250 mg by mouth daily.     . metroNIDAZOLE (METROCREAM) 0.75 % cream Apply topically 2 (two) times daily. 45 g 1  . montelukast (SINGULAIR) 10 MG tablet Take 1 tablet (10 mg total) by mouth at bedtime. 90 tablet 3  . Multiple  Vitamin (MULTIVITAMIN) capsule Take 1 capsule by mouth daily.    . Oxymetazoline HCl (NASAL SPRAY NA) Place 2 sprays into the nose as needed (CONGESTION).     Marland Kitchen potassium chloride (K-DUR,KLOR-CON) 10 MEQ tablet TAKE 1 TABLET BY MOUTH TWICE A DAY (NEED APPT FOR FOLLOW UP AND LAB WORK) 60 tablet 6  . albuterol (PROVENTIL HFA;VENTOLIN HFA) 108 (90 Base) MCG/ACT inhaler Inhale 2 puffs into the lungs every 4 (four) hours as needed for wheezing or shortness of breath. (Patient not taking: Reported on 09/08/2018) 1 Inhaler 0   No current facility-administered medications on file prior to visit.     PAST MEDICAL HISTORY: Past Medical History:  Diagnosis Date  . Asthma   . Chiari I malformation ([place])   . Chiari malformation 1996   decompression   . High blood pressure   . Hypertension   .  Idiopathic intracranial hypertension   . Lactose intolerance   . Pseudotumor cerebri   . Subclavian bypass stenosis ([place]) 11/97   right  . Swallowing difficulty     PAST SURGICAL HISTORY: Past Surgical History:  Procedure Laterality Date  . APPENDECTOMY    . chiari decompression    . GANGLION CYST EXCISION Right    wrist  . LAPAROSCOPIC APPENDECTOMY N/A 10/28/2015   Procedure: APPENDECTOMY LAPAROSCOPIC;  Surgeon: Armandina Gemma, MD;  Location: [facility];  Service: General;  Laterality: N/A;  . SUBCLAVIAN BYPASS GRAFT Right 07/1996  . WRIST FRACTURE SURGERY Right 02/2007    SOCIAL HISTORY: Social History   Tobacco Use  . Smoking status: Never Smoker  . Smokeless tobacco: Never Used  Substance Use Topics  . Alcohol use: No    Alcohol/week: 0.0 standard drinks  . Drug use: No    FAMILY HISTORY: Family History  Problem Relation Age of Onset  . Cataracts Mother   . Diabetes Mother   . Hypertension Mother   . High Cholesterol Mother   . Obesity Mother   . Diabetes Father   . Hypertension Father   . Obesity Father     ROS: Review of Systems  Constitutional: Positive for malaise/fatigue.    HENT:       + Difficult or Painful Swallowing  Eyes:       + Wear Glasses or Contacts  Respiratory: Positive for cough, shortness of breath (on exertion) and wheezing.   Cardiovascular: Negative for chest pain and orthopnea.       + Leg Cramping + Very Cold Feet or Hands  Gastrointestinal: Negative for nausea and vomiting.       + Swallowing Difficulty  Musculoskeletal:       Negative for muscle weakness    PHYSICAL EXAM: Blood pressure 129/82, pulse 77, temperature 97.6 F (36.4 C), temperature source Oral, height 5\' 4"  (1.626 m), weight 250 lb (113.4 kg), last menstrual period 08/15/2018, SpO2 100 %. Body mass index is 42.91 kg/m. Physical Exam Vitals signs reviewed.  Constitutional:      Appearance: Normal appearance. She is well-developed. She is obese.  HENT:     Head: Normocephalic and atraumatic.     Comments: Frontal Hair loss    Nose: Nose normal.     Mouth/Throat:     Comments: Mallampati = 4 Eyes:     General: No scleral icterus.    Extraocular Movements: Extraocular movements intact.  Neck:     Musculoskeletal: Normal range of motion and neck supple.     Thyroid: No thyromegaly.  Cardiovascular:     Rate and Rhythm: Normal rate and regular rhythm.  Pulmonary:     Effort: Pulmonary effort is normal. No respiratory distress.  Abdominal:     Palpations: Abdomen is soft.     Tenderness: There is no abdominal tenderness.  Musculoskeletal: Normal range of motion.  Skin:    General: Skin is warm and dry.  Neurological:     Mental Status: She is alert and oriented to person, place, and time.     Coordination: Coordination normal.  Psychiatric:        Mood and Affect: Mood normal.        Behavior: Behavior normal.     RECENT LABS AND TESTS: BMET    Component Value Date/Time   NA 141 07/28/2018 1302   K 3.5 07/28/2018 1302   CL 109 (H) 07/28/2018 1302   CO2 16 (L) 07/28/2018 1302   GLUCOSE  78 07/28/2018 1302   GLUCOSE 104 (H) 10/28/2015 2043   BUN  16 07/28/2018 1302   CREATININE 0.92 07/28/2018 1302   CALCIUM 9.3 07/28/2018 1302   GFRNONAA 75 07/28/2018 1302   GFRAA 87 07/28/2018 1302   No results found for: HGBA1C No results found for: INSULIN CBC    Component Value Date/Time   WBC 7.2 07/28/2018 1302   WBC 15.0 (H) 10/28/2015 1931   RBC 4.59 07/28/2018 1302   RBC 4.37 10/28/2015 1931   HGB 14.2 07/28/2018 1302   HCT 42.3 07/28/2018 1302   PLT 243 07/28/2018 1302   MCV 92 07/28/2018 1302   MCH 30.9 07/28/2018 1302   MCH 30.2 10/28/2015 1931   MCHC 33.6 07/28/2018 1302   MCHC 34.0 10/28/2015 1931   RDW 12.0 (L) 07/28/2018 1302   LYMPHSABS 1.9 07/28/2018 1302   MONOABS 0.6 05/04/2015 0250   EOSABS 0.4 07/28/2018 1302   BASOSABS 0.0 07/28/2018 1302   Iron/TIBC/Ferritin/ %Sat No results found for: IRON, TIBC, FERRITIN, IRONPCTSAT Lipid Panel     Component Value Date/Time   CHOL 186 07/28/2018 1302   TRIG 124 07/28/2018 1302   HDL 51 07/28/2018 1302   CHOLHDL 3.6 07/28/2018 1302   LDLCALC 110 (H) 07/28/2018 1302   Hepatic Function Panel     Component Value Date/Time   PROT 7.0 07/28/2018 1302   ALBUMIN 4.3 07/28/2018 1302   AST 11 07/28/2018 1302   ALT 22 07/28/2018 1302   ALKPHOS 38 (L) 07/28/2018 1302   BILITOT 0.5 07/28/2018 1302      Component Value Date/Time   TSH 2.450 07/28/2018 1302   TSH 1.850 12/19/2016 1057   TSH 1.880 03/15/2015 0924   Vitamin D Results for CAYDEN, RAUTIO (MRN [id number]) as of 09/08/2018 13:53  Ref. Range 12/19/2016 10:57  Vitamin D, 25-Hydroxy Latest Ref Range: 30.0 - 100.0 ng/mL 35.3    ECG  shows NSR with a rate of 74 BPM INDIRECT CALORIMETER done today shows a VO2 of 282 and a REE of 1963.  Her calculated basal metabolic rate is 8676 thus her basal metabolic rate is better than expected.       OBESITY BEHAVIORAL INTERVENTION VISIT  Today's visit was # 1   Starting weight: 250 lbs Starting date: 09/08/2018 Today's weight : 250 lbs Today's date:  09/08/2018 Total lbs lost to date: 0   ASK: We discussed the diagnosis of obesity with [name] today and Cecille Rubin agreed to give [name] permission to discuss obesity behavioral modification therapy today.  ASSESS: Jocilyn has the diagnosis of obesity and her BMI today is 42.89 Quanisha is in the action stage of change   ADVISE: Luceal was educated on the multiple health risks of obesity as well as the benefit of weight loss to improve her health. She was advised of the need for long term treatment and the importance of lifestyle modifications to improve her current health and to decrease her risk of future health problems.  AGREE: Multiple dietary modification options and treatment options were discussed and  Maryrose agreed to follow the recommendations documented in the above note.  ARRANGE: Kasmira was educated on the importance of frequent visits to treat obesity as outlined per CMS and USPSTF guidelines and agreed to schedule her next follow up appointment today.  Corey Skains, am acting as Location manager for [employer]. Owens Shark, DO  I have reviewed the above documentation for accuracy and completeness, and I agree with the above. -Jearld Lesch, DO

## 2018-09-09 LAB — HEMOGLOBIN A1C
Est. average glucose Bld gHb Est-mCnc: 103 mg/dL
Hgb A1c MFr Bld: 5.2 % (ref 4.8–5.6)

## 2018-09-09 LAB — COMPREHENSIVE METABOLIC PANEL
ALT: 15 IU/L (ref 0–32)
AST: 12 IU/L (ref 0–40)
Albumin/Globulin Ratio: 1.7 (ref 1.2–2.2)
Albumin: 4.4 g/dL (ref 3.5–5.5)
Alkaline Phosphatase: 42 IU/L (ref 39–117)
BUN/Creatinine Ratio: 15 (ref 9–23)
BUN: 12 mg/dL (ref 6–24)
Bilirubin Total: 0.4 mg/dL (ref 0.0–1.2)
CALCIUM: 8.9 mg/dL (ref 8.7–10.2)
CHLORIDE: 108 mmol/L — AB (ref 96–106)
CO2: 16 mmol/L — ABNORMAL LOW (ref 20–29)
CREATININE: 0.81 mg/dL (ref 0.57–1.00)
GFR calc Af Amer: 101 mL/min/{1.73_m2} (ref >59)
GFR, EST NON AFRICAN AMERICAN: 88 mL/min/{1.73_m2} (ref >59)
GLUCOSE: 79 mg/dL (ref 65–99)
Globulin, Total: 2.6 g/dL (ref 1.5–4.5)
Potassium: 3.9 mmol/L (ref 3.5–5.2)
Sodium: 139 mmol/L (ref 134–144)
Total Protein: 7 g/dL (ref 6.0–8.5)

## 2018-09-09 LAB — VITAMIN D 25 HYDROXY (VIT D DEFICIENCY, FRACTURES): VIT D 25 HYDROXY: 31.3 ng/mL (ref 30.0–100.0)

## 2018-09-09 LAB — INSULIN, RANDOM: INSULIN: 16.9 u[IU]/mL (ref 2.6–24.9)

## 2018-09-19 DIAGNOSIS — R928 Other abnormal and inconclusive findings on diagnostic imaging of breast: Secondary | ICD-10-CM | POA: Diagnosis not present

## 2018-09-19 DIAGNOSIS — N6489 Other specified disorders of breast: Secondary | ICD-10-CM | POA: Diagnosis not present

## 2018-09-19 LAB — HM MAMMOGRAPHY

## 2018-09-22 ENCOUNTER — Ambulatory Visit (INDEPENDENT_AMBULATORY_CARE_PROVIDER_SITE_OTHER): Payer: 59 | Admitting: Bariatrics

## 2018-09-22 ENCOUNTER — Encounter (INDEPENDENT_AMBULATORY_CARE_PROVIDER_SITE_OTHER): Payer: Self-pay | Admitting: Bariatrics

## 2018-09-22 VITALS — BP 110/72 | HR 83 | Temp 98.2°F | Ht 64.0 in | Wt 243.0 lb

## 2018-09-22 DIAGNOSIS — E8881 Metabolic syndrome: Secondary | ICD-10-CM | POA: Diagnosis not present

## 2018-09-22 DIAGNOSIS — Z6841 Body Mass Index (BMI) 40.0 and over, adult: Secondary | ICD-10-CM | POA: Diagnosis not present

## 2018-09-22 DIAGNOSIS — I10 Essential (primary) hypertension: Secondary | ICD-10-CM

## 2018-09-22 DIAGNOSIS — Z9189 Other specified personal risk factors, not elsewhere classified: Secondary | ICD-10-CM | POA: Diagnosis not present

## 2018-09-22 DIAGNOSIS — E559 Vitamin D deficiency, unspecified: Secondary | ICD-10-CM | POA: Diagnosis not present

## 2018-09-22 DIAGNOSIS — E88819 Insulin resistance, unspecified: Secondary | ICD-10-CM

## 2018-09-22 DIAGNOSIS — E66813 Obesity, class 3: Secondary | ICD-10-CM

## 2018-09-22 MED ORDER — VITAMIN D (ERGOCALCIFEROL) 1.25 MG (50000 UNIT) PO CAPS: 50000.0000 [IU] | ORAL_CAPSULE | ORAL | 0 refills | Status: DC

## 2018-09-22 MED FILL — VIT D2 1.25 MG (50,000 UNIT: 1.25 MG | 28 days supply | Qty: 4 | Fill #0

## 2018-09-23 ENCOUNTER — Encounter (INDEPENDENT_AMBULATORY_CARE_PROVIDER_SITE_OTHER): Payer: Self-pay | Admitting: Bariatrics

## 2018-09-23 NOTE — Progress Notes (Signed)
Office: [phone]  /  Fax: [phone]   HPI:   Chief Complaint: OBESITY [name] is here to discuss her progress with her obesity treatment plan. She is on the Category 3 plan and is following her eating plan approximately 98 % of the time. She states she is exercising 0 minutes 0 times per week. [name] is doing well. She is struggling with getting more protein. She did crave "chinese food" with her menstrual cycle. Her hunger has been controlled. Her weight is 243 lb (110.2 kg) today and has had a weight loss of 7 pounds over a period of 2 weeks since her last visit. She has lost 7 lbs since starting treatment with [name].  Insulin Resistance [name] has a diagnosis of insulin resistance based on her elevated fasting insulin level >5. Although [name]'s blood glucose readings are still under good control, insulin resistance puts her at greater risk of metabolic syndrome and diabetes. She is not taking medications currently and continues to work on diet and exercise to decrease risk of diabetes. She denies polyphagia.  Vitamin D deficiency [name] has a diagnosis of vitamin D deficiency. Her last vitamin D level was at 31.3 She is currently taking OTC vit D and denies nausea, vomiting or muscle weakness.  At risk for osteopenia and osteoporosis [name] is at higher risk of osteopenia and osteoporosis due to vitamin D deficiency.   Hypertension [name] is a 48 y.o. female with hypertension. She is taking Lisinopril-HCTZ. [name] denies chest pain or shortness of breath on exertion. She is working weight loss to help control her blood pressure with the goal of decreasing her risk of heart attack and stroke. [name] blood pressure is well controlled.  ASSESSMENT AND PLAN:  Insulin resistance  Vitamin D deficiency - Plan: Vitamin D, Ergocalciferol, (DRISDOL) 1.25 MG (50000 UT) CAPS capsule  Essential hypertension  At risk for osteoporosis  Class 3 severe obesity with serious  comorbidity and body mass index (BMI) of 40.0 to 44.9 in adult, unspecified obesity type ([place])  PLAN:  Insulin Resistance [name] will continue to work on weight loss, exercise, increasing healthy protein and fats, decreasing simple carbohydrates in her diet to help decrease the risk of diabetes. She was informed that eating too many simple carbohydrates or too many calories at one sitting increases the likelihood of GI side effects. [name] agreed to follow up with [name] as directed to monitor her progress.  Vitamin D Deficiency [name] was informed that low vitamin D levels contributes to fatigue and are associated with obesity, breast, and colon cancer. She agrees to start prescription Vit D @50 ,000 IU every week #4 with no refills and will follow up for routine testing of vitamin D, at least 2-3 times per year. She was informed of the risk of over-replacement of vitamin D and agrees to not increase her dose unless she discusses this with [name] first. [name] agrees to follow up as directed.  At risk for osteopenia and osteoporosis [name] was given extended  (15 minutes) osteoporosis prevention counseling today. [name] is at risk for osteopenia and osteoporosis due to her vitamin D deficiency. She was encouraged to take her vitamin D and follow her higher calcium diet and increase strengthening exercise to help strengthen her bones and decrease her risk of osteopenia and osteoporosis.  Hypertension We discussed sodium restriction, working on healthy weight loss, and a regular exercise program as the means to achieve improved blood pressure control. [name] agreed with this plan and agreed  to follow up as directed. We will continue to monitor her blood pressure as well as her progress with the above lifestyle modifications. She will continue her medications as prescribed and will watch for signs of hypotension as she continues her lifestyle modifications.  Obesity [name] is currently in the action stage of change. As  such, her goal is to continue with weight loss efforts She has agreed to follow the Category 3 plan [name] has been instructed to work up to a goal of 150 minutes of combined cardio and strengthening exercise per week for weight loss and overall health benefits. We discussed the following Behavioral Modification Strategies today: increase H2O intake, keeping healthy foods in the home, increasing lean protein intake, decreasing simple carbohydrates, increasing vegetables and work on meal planning and easy cooking plans  [name] has agreed to follow up with our clinic in 2 weeks. She was informed of the importance of frequent follow up visits to maximize her success with intensive lifestyle modifications for her multiple health conditions.  ALLERGIES: Allergies  Allergen Reactions  . Codeine Other (See Comments)    "whole body turned bright red, swelled a little" with V codiene  . Lyrica [Pregabalin] Other (See Comments)    "suicidal thoughts"  . Nyquil Multi-Symptom [Pseudoeph-Doxylamine-Dm-Apap] Other (See Comments)    Turns skin red *all over body*, pt reports getting very hot   . Sulfa Antibiotics Other (See Comments)    "body turned red"    MEDICATIONS: Current Outpatient Medications on File Prior to Visit  Medication Sig Dispense Refill  . acetaZOLAMIDE (DIAMOX) 250 MG tablet Take 2 tablets (500 mg total) by mouth 2 (two) times daily. 360 tablet 4  . albuterol (PROVENTIL HFA;VENTOLIN HFA) 108 (90 Base) MCG/ACT inhaler Inhale 2 puffs into the lungs every 4 (four) hours as needed for wheezing or shortness of breath. (Patient not taking: Reported on 09/08/2018) 1 Inhaler 0  . Calcium Carbonate-Vitamin D (CALCIUM + D PO) Take 1 tablet by mouth daily. Calcium 600 mg, vit D3 300 mg    . cetirizine (ZYRTEC) 5 MG tablet Take 5 mg by mouth daily.    . Cyanocobalamin (VITAMIN B 12 PO) Take 5,000 mcg by mouth daily.     . Echinacea 125 MG CAPS Take 125 mg by mouth daily.     . Flax Oil-Fish Oil-Borage  Oil (FISH OIL-FLAX OIL-BORAGE OIL) CAPS Take 2 capsules by mouth daily.     Marland Kitchen lisinopril-hydrochlorothiazide (PRINZIDE,ZESTORETIC) 20-12.5 MG tablet Take 1 tablet by mouth daily. 90 tablet 3  . Magnesium 250 MG TABS Take 250 mg by mouth daily.     . metroNIDAZOLE (METROCREAM) 0.75 % cream Apply topically 2 (two) times daily. 45 g 1  . montelukast (SINGULAIR) 10 MG tablet Take 1 tablet (10 mg total) by mouth at bedtime. 90 tablet 3  . Multiple Vitamin (MULTIVITAMIN) capsule Take 1 capsule by mouth daily.    . Oxymetazoline HCl (NASAL SPRAY NA) Place 2 sprays into the nose as needed (CONGESTION).     Marland Kitchen potassium chloride (K-DUR,KLOR-CON) 10 MEQ tablet TAKE 1 TABLET BY MOUTH TWICE A DAY (NEED APPT FOR FOLLOW UP AND LAB WORK) 60 tablet 6   No current facility-administered medications on file prior to visit.     PAST MEDICAL HISTORY: Past Medical History:  Diagnosis Date  . Asthma   . Chiari I malformation ([place])   . Chiari malformation 1996   decompression   . High blood pressure   . Hypertension   .  Idiopathic intracranial hypertension   . Lactose intolerance   . Pseudotumor cerebri   . Subclavian bypass stenosis ([place]) 11/97   right  . Swallowing difficulty     PAST SURGICAL HISTORY: Past Surgical History:  Procedure Laterality Date  . APPENDECTOMY    . chiari decompression    . GANGLION CYST EXCISION Right    wrist  . LAPAROSCOPIC APPENDECTOMY N/A 10/28/2015   Procedure: APPENDECTOMY LAPAROSCOPIC;  Surgeon: Armandina Gemma, MD;  Location: [facility];  Service: General;  Laterality: N/A;  . SUBCLAVIAN BYPASS GRAFT Right 07/1996  . WRIST FRACTURE SURGERY Right 02/2007    SOCIAL HISTORY: Social History   Tobacco Use  . Smoking status: Never Smoker  . Smokeless tobacco: Never Used  Substance Use Topics  . Alcohol use: No    Alcohol/week: 0.0 standard drinks  . Drug use: No    FAMILY HISTORY: Family History  Problem Relation Age of Onset  . Cataracts Mother   . Diabetes Mother    . Hypertension Mother   . High Cholesterol Mother   . Obesity Mother   . Diabetes Father   . Hypertension Father   . Obesity Father     ROS: Review of Systems  Constitutional: Positive for weight loss.  Respiratory: Negative for shortness of breath (on exertion).   Cardiovascular: Negative for chest pain.  Gastrointestinal: Negative for nausea and vomiting.  Musculoskeletal:       Negative for muscle weakness  Endo/Heme/Allergies:       Negative for polyphagia    PHYSICAL EXAM: Blood pressure 110/72, pulse 83, temperature 98.2 F (36.8 C), temperature source Oral, height 5\' 4"  (1.626 m), weight 243 lb (110.2 kg), SpO2 97 %. Body mass index is 41.71 kg/m. Physical Exam Vitals signs reviewed.  Constitutional:      Appearance: Normal appearance. She is well-developed. She is obese.  Cardiovascular:     Rate and Rhythm: Normal rate.  Pulmonary:     Effort: Pulmonary effort is normal.  Musculoskeletal: Normal range of motion.  Skin:    General: Skin is warm and dry.  Neurological:     Mental Status: She is alert and oriented to person, place, and time.  Psychiatric:        Mood and Affect: Mood normal.        Behavior: Behavior normal.     RECENT LABS AND TESTS: BMET    Component Value Date/Time   NA 139 09/08/2018 0000   K 3.9 09/08/2018 0000   CL 108 (H) 09/08/2018 0000   CO2 16 (L) 09/08/2018 0000   GLUCOSE 79 09/08/2018 0000   GLUCOSE 104 (H) 10/28/2015 2043   BUN 12 09/08/2018 0000   CREATININE 0.81 09/08/2018 0000   CALCIUM 8.9 09/08/2018 0000   GFRNONAA 88 09/08/2018 0000   GFRAA 101 09/08/2018 0000   Lab Results  Component Value Date   HGBA1C 5.2 09/08/2018   Lab Results  Component Value Date   INSULIN 16.9 09/08/2018   CBC    Component Value Date/Time   WBC 7.2 07/28/2018 1302   WBC 15.0 (H) 10/28/2015 1931   RBC 4.59 07/28/2018 1302   RBC 4.37 10/28/2015 1931   HGB 14.2 07/28/2018 1302   HCT 42.3 07/28/2018 1302   PLT 243 07/28/2018  1302   MCV 92 07/28/2018 1302   MCH 30.9 07/28/2018 1302   MCH 30.2 10/28/2015 1931   MCHC 33.6 07/28/2018 1302   MCHC 34.0 10/28/2015 1931   RDW 12.0 (L) 07/28/2018 1302  LYMPHSABS 1.9 07/28/2018 1302   MONOABS 0.6 05/04/2015 0250   EOSABS 0.4 07/28/2018 1302   BASOSABS 0.0 07/28/2018 1302   Iron/TIBC/Ferritin/ %Sat No results found for: IRON, TIBC, FERRITIN, IRONPCTSAT Lipid Panel     Component Value Date/Time   CHOL 186 07/28/2018 1302   TRIG 124 07/28/2018 1302   HDL 51 07/28/2018 1302   CHOLHDL 3.6 07/28/2018 1302   LDLCALC 110 (H) 07/28/2018 1302   Hepatic Function Panel     Component Value Date/Time   PROT 7.0 09/08/2018 0000   ALBUMIN 4.4 09/08/2018 0000   AST 12 09/08/2018 0000   ALT 15 09/08/2018 0000   ALKPHOS 42 09/08/2018 0000   BILITOT 0.4 09/08/2018 0000      Component Value Date/Time   TSH 2.450 07/28/2018 1302   TSH 1.850 12/19/2016 1057   TSH 1.880 03/15/2015 0924     Ref. Range 09/08/2018 00:00  Vitamin D, 25-Hydroxy Latest Ref Range: 30.0 - 100.0 ng/mL 31.3     OBESITY BEHAVIORAL INTERVENTION VISIT  Today's visit was # 2   Starting weight: 250 lbs Starting date: 09/08/2018 Today's weight : 243 lbs  Today's date: 09/22/2018 Total lbs lost to date: 7   ASK: We discussed the diagnosis of obesity with [name] today and [name] agreed to give [name] permission to discuss obesity behavioral modification therapy today.  ASSESS: [name] has the diagnosis of obesity and her BMI today is 41.69 Jaydn is in the action stage of change   ADVISE: [name] was educated on the multiple health risks of obesity as well as the benefit of weight loss to improve her health. She was advised of the need for long term treatment and the importance of lifestyle modifications to improve her current health and to decrease her risk of future health problems.  AGREE: Multiple dietary modification options and treatment options were discussed and  [name] agreed to follow the  recommendations documented in the above note.  ARRANGE: Callee was educated on the importance of frequent visits to treat obesity as outlined per CMS and USPSTF guidelines and agreed to schedule her next follow up appointment today.  Corey Skains, am acting as Location manager for [employer]. Owens Shark, DO  I have reviewed the above documentation for accuracy and completeness, and I agree with the above. -Jearld Lesch, DO

## 2018-09-24 ENCOUNTER — Encounter (INDEPENDENT_AMBULATORY_CARE_PROVIDER_SITE_OTHER): Payer: Self-pay | Admitting: Bariatrics

## 2018-09-24 DIAGNOSIS — E8881 Metabolic syndrome: Secondary | ICD-10-CM | POA: Insufficient documentation

## 2018-09-24 DIAGNOSIS — E559 Vitamin D deficiency, unspecified: Secondary | ICD-10-CM | POA: Insufficient documentation

## 2018-10-05 MED FILL — POTASSIUM CHL ER M10 TABLET: 10 | 30 days supply | Qty: 60 | Fill #2

## 2018-10-07 ENCOUNTER — Encounter (INDEPENDENT_AMBULATORY_CARE_PROVIDER_SITE_OTHER): Payer: Self-pay | Admitting: Bariatrics

## 2018-10-07 ENCOUNTER — Ambulatory Visit (INDEPENDENT_AMBULATORY_CARE_PROVIDER_SITE_OTHER): Payer: 59 | Admitting: Bariatrics

## 2018-10-07 VITALS — BP 124/82 | HR 68 | Temp 97.8°F | Ht 64.0 in | Wt 242.0 lb

## 2018-10-07 DIAGNOSIS — Z6841 Body Mass Index (BMI) 40.0 and over, adult: Secondary | ICD-10-CM

## 2018-10-07 DIAGNOSIS — E8881 Metabolic syndrome: Secondary | ICD-10-CM | POA: Diagnosis not present

## 2018-10-07 DIAGNOSIS — E559 Vitamin D deficiency, unspecified: Secondary | ICD-10-CM

## 2018-10-07 NOTE — Progress Notes (Signed)
Office: [phone]  /  Fax: [phone]   HPI:   Chief Complaint: OBESITY [name] is here to discuss her progress with her obesity treatment plan. She is on the Category 3 plan and is following her eating plan approximately 94 % of the time. She states she is walking 20 to 30 minutes 2 to 3 times per week. [name] states that her overall schedule has been more hectic. Her weight is 242 lb (109.8 kg) today and has had a weight loss of 1 pound over a period of 2 weeks since her last visit. She has lost 2 lbs since starting treatment with [name].  Insulin Resistance [name] has a diagnosis of insulin resistance based on her elevated fasting insulin level >5. Although [name]'s blood glucose readings are still under good control, insulin resistance puts her at greater risk of metabolic syndrome and diabetes. Her last A1c was at 5.2 and last insulin level was at 16.9 She is not taking medications currently and continues to work on diet and exercise to decrease risk of diabetes. [name] denies polyphagia.  Vitamin D deficiency [name] has a diagnosis of vitamin D deficiency. Her last vitamin D level was at 31.3 She is currently taking high dose vit D and denies nausea, vomiting or muscle weakness.  ASSESSMENT AND PLAN:  Insulin resistance  Vitamin D deficiency  Class 3 severe obesity with serious comorbidity and body mass index (BMI) of 40.0 to 44.9 in adult, unspecified obesity type ([place])  PLAN:  Insulin Resistance [name] will continue to work on weight loss, exercise, increasing lean protein and decreasing simple carbohydrates in her diet to help decrease the risk of diabetes. She was informed that eating too many simple carbohydrates or too many calories at one sitting increases the likelihood of GI side effects. [name] agreed to follow up with [name] as directed to monitor her progress.  Vitamin D Deficiency [name] was informed that low vitamin D levels contributes to fatigue and are associated with obesity,  breast, and colon cancer. She agrees to continue to take prescription Vit D @50 ,000 IU every week and will follow up for routine testing of vitamin D, at least 2-3 times per year. She was informed of the risk of over-replacement of vitamin D and agrees to not increase her dose unless she discusses this with [name] first.  I spent > than 50% of the 15 minute visit on counseling as documented in the note.  Obesity [name] is currently in the action stage of change. As such, her goal is to continue with weight loss efforts She has agreed to follow the Category 3 plan [name] has been instructed to work up to a goal of 150 minutes of combined cardio and strengthening exercise per week for weight loss and overall health benefits. We discussed the following Behavioral Modification Strategies today: increase H2O intake, no skipping meals, increasing lean protein intake, decreasing simple carbohydrates, increasing vegetables and work on meal planning and easy cooking plans  [name] has agreed to follow up with our clinic in 2 weeks. She was informed of the importance of frequent follow up visits to maximize her success with intensive lifestyle modifications for her multiple health conditions.  ALLERGIES: Allergies  Allergen Reactions  . Codeine Other (See Comments)    "whole body turned bright red, swelled a little" with V codiene  . Lyrica [Pregabalin] Other (See Comments)    "suicidal thoughts"  . Nyquil Multi-Symptom [Pseudoeph-Doxylamine-Dm-Apap] Other (See Comments)    Turns skin red *all over body*,  pt reports getting very hot   . Sulfa Antibiotics Other (See Comments)    "body turned red"    MEDICATIONS: Current Outpatient Medications on File Prior to Visit  Medication Sig Dispense Refill  . acetaZOLAMIDE (DIAMOX) 250 MG tablet Take 2 tablets (500 mg total) by mouth 2 (two) times daily. 360 tablet 4  . albuterol (PROVENTIL HFA;VENTOLIN HFA) 108 (90 Base) MCG/ACT inhaler Inhale 2 puffs into the lungs  every 4 (four) hours as needed for wheezing or shortness of breath. 1 Inhaler 0  . Calcium Carbonate-Vitamin D (CALCIUM + D PO) Take 1 tablet by mouth daily. Calcium 600 mg, vit D3 300 mg    . cetirizine (ZYRTEC) 5 MG tablet Take 5 mg by mouth daily.    . Cyanocobalamin (VITAMIN B 12 PO) Take 5,000 mcg by mouth daily.     . Echinacea 125 MG CAPS Take 125 mg by mouth daily.     . Flax Oil-Fish Oil-Borage Oil (FISH OIL-FLAX OIL-BORAGE OIL) CAPS Take 2 capsules by mouth daily.     Marland Kitchen lisinopril-hydrochlorothiazide (PRINZIDE,ZESTORETIC) 20-12.5 MG tablet Take 1 tablet by mouth daily. 90 tablet 3  . Magnesium 250 MG TABS Take 250 mg by mouth daily.     . metroNIDAZOLE (METROCREAM) 0.75 % cream Apply topically 2 (two) times daily. 45 g 1  . montelukast (SINGULAIR) 10 MG tablet Take 1 tablet (10 mg total) by mouth at bedtime. 90 tablet 3  . Multiple Vitamin (MULTIVITAMIN) capsule Take 1 capsule by mouth daily.    . Oxymetazoline HCl (NASAL SPRAY NA) Place 2 sprays into the nose as needed (CONGESTION).     Marland Kitchen potassium chloride (K-DUR,KLOR-CON) 10 MEQ tablet TAKE 1 TABLET BY MOUTH TWICE A DAY (NEED APPT FOR FOLLOW UP AND LAB WORK) 60 tablet 6  . Vitamin D, Ergocalciferol, (DRISDOL) 1.25 MG (50000 UT) CAPS capsule Take 1 capsule (50,000 Units total) by mouth every 7 (seven) days. 4 capsule 0   No current facility-administered medications on file prior to visit.     PAST MEDICAL HISTORY: Past Medical History:  Diagnosis Date  . Asthma   . Chiari I malformation ([place])   . Chiari malformation 1996   decompression   . High blood pressure   . Hypertension   . Idiopathic intracranial hypertension   . Lactose intolerance   . Pseudotumor cerebri   . Subclavian bypass stenosis ([place]) 11/97   right  . Swallowing difficulty     PAST SURGICAL HISTORY: Past Surgical History:  Procedure Laterality Date  . APPENDECTOMY    . chiari decompression    . GANGLION CYST EXCISION Right    wrist  . LAPAROSCOPIC  APPENDECTOMY N/A 10/28/2015   Procedure: APPENDECTOMY LAPAROSCOPIC;  Surgeon: Armandina Gemma, MD;  Location: [facility];  Service: General;  Laterality: N/A;  . SUBCLAVIAN BYPASS GRAFT Right 07/1996  . WRIST FRACTURE SURGERY Right 02/2007    SOCIAL HISTORY: Social History   Tobacco Use  . Smoking status: Never Smoker  . Smokeless tobacco: Never Used  Substance Use Topics  . Alcohol use: No    Alcohol/week: 0.0 standard drinks  . Drug use: No    FAMILY HISTORY: Family History  Problem Relation Age of Onset  . Cataracts Mother   . Diabetes Mother   . Hypertension Mother   . High Cholesterol Mother   . Obesity Mother   . Diabetes Father   . Hypertension Father   . Obesity Father     ROS: Review of Systems  Constitutional: Positive for weight loss.  Gastrointestinal: Negative for nausea and vomiting.  Musculoskeletal:       Negative for muscle weakness  Endo/Heme/Allergies:       Negative for polyphagia    PHYSICAL EXAM: Blood pressure 124/82, pulse 68, temperature 97.8 F (36.6 C), temperature source Oral, height 5\' 4"  (1.626 m), weight 242 lb (109.8 kg), SpO2 100 %. Body mass index is 41.54 kg/m. Physical Exam Vitals signs reviewed.  Constitutional:      Appearance: Normal appearance. She is well-developed. She is obese.  Cardiovascular:     Rate and Rhythm: Normal rate.  Pulmonary:     Effort: Pulmonary effort is normal.  Musculoskeletal: Normal range of motion.  Skin:    General: Skin is warm and dry.  Neurological:     Mental Status: She is alert and oriented to person, place, and time.  Psychiatric:        Mood and Affect: Mood normal.        Behavior: Behavior normal.     RECENT LABS AND TESTS: BMET    Component Value Date/Time   NA 139 09/08/2018 0000   K 3.9 09/08/2018 0000   CL 108 (H) 09/08/2018 0000   CO2 16 (L) 09/08/2018 0000   GLUCOSE 79 09/08/2018 0000   GLUCOSE 104 (H) 10/28/2015 2043   BUN 12 09/08/2018 0000   CREATININE 0.81 09/08/2018  0000   CALCIUM 8.9 09/08/2018 0000   GFRNONAA 88 09/08/2018 0000   GFRAA 101 09/08/2018 0000   Lab Results  Component Value Date   HGBA1C 5.2 09/08/2018   Lab Results  Component Value Date   INSULIN 16.9 09/08/2018   CBC    Component Value Date/Time   WBC 7.2 07/28/2018 1302   WBC 15.0 (H) 10/28/2015 1931   RBC 4.59 07/28/2018 1302   RBC 4.37 10/28/2015 1931   HGB 14.2 07/28/2018 1302   HCT 42.3 07/28/2018 1302   PLT 243 07/28/2018 1302   MCV 92 07/28/2018 1302   MCH 30.9 07/28/2018 1302   MCH 30.2 10/28/2015 1931   MCHC 33.6 07/28/2018 1302   MCHC 34.0 10/28/2015 1931   RDW 12.0 (L) 07/28/2018 1302   LYMPHSABS 1.9 07/28/2018 1302   MONOABS 0.6 05/04/2015 0250   EOSABS 0.4 07/28/2018 1302   BASOSABS 0.0 07/28/2018 1302   Iron/TIBC/Ferritin/ %Sat No results found for: IRON, TIBC, FERRITIN, IRONPCTSAT Lipid Panel     Component Value Date/Time   CHOL 186 07/28/2018 1302   TRIG 124 07/28/2018 1302   HDL 51 07/28/2018 1302   CHOLHDL 3.6 07/28/2018 1302   LDLCALC 110 (H) 07/28/2018 1302   Hepatic Function Panel     Component Value Date/Time   PROT 7.0 09/08/2018 0000   ALBUMIN 4.4 09/08/2018 0000   AST 12 09/08/2018 0000   ALT 15 09/08/2018 0000   ALKPHOS 42 09/08/2018 0000   BILITOT 0.4 09/08/2018 0000      Component Value Date/Time   TSH 2.450 07/28/2018 1302   TSH 1.850 12/19/2016 1057   TSH 1.880 03/15/2015 0924     Ref. Range 09/08/2018 00:00  Vitamin D, 25-Hydroxy Latest Ref Range: 30.0 - 100.0 ng/mL 31.3     OBESITY BEHAVIORAL INTERVENTION VISIT  Today's visit was # 3   Starting weight: 250 lbs Starting date: 09/08/2018 Today's weight : 242 lbs Today's date: 10/07/2018 Total lbs lost to date: 8   ASK: We discussed the diagnosis of obesity with [name] today and [name] agreed to give  [name] permission to discuss obesity behavioral modification therapy today.  ASSESS: [name] has the diagnosis of obesity and her BMI today is 41.52 [name] is in  the action stage of change   ADVISE: [place] was educated on the multiple health risks of obesity as well as the benefit of weight loss to improve her health. She was advised of the need for long term treatment and the importance of lifestyle modifications to improve her current health and to decrease her risk of future health problems.  AGREE: Multiple dietary modification options and treatment options were discussed and  [name] agreed to follow the recommendations documented in the above note.  ARRANGE: [name] was educated on the importance of frequent visits to treat obesity as outlined per CMS and USPSTF guidelines and agreed to schedule her next follow up appointment today.  Corey Skains, am acting as Location manager for [employer]. Owens Shark, DO  I have reviewed the above documentation for accuracy and completeness, and I agree with the above. -Jearld Lesch, DO

## 2018-10-20 MED FILL — LISINOPRIL-HCTZ 20-12.5 MG: 20-12.5 | 90 days supply | Qty: 90 | Fill #0

## 2018-10-27 ENCOUNTER — Ambulatory Visit (INDEPENDENT_AMBULATORY_CARE_PROVIDER_SITE_OTHER): Payer: 59 | Admitting: Bariatrics

## 2018-10-27 ENCOUNTER — Encounter (INDEPENDENT_AMBULATORY_CARE_PROVIDER_SITE_OTHER): Payer: Self-pay | Admitting: Bariatrics

## 2018-10-27 VITALS — BP 98/66 | HR 84 | Temp 97.9°F | Ht 64.0 in | Wt 237.0 lb

## 2018-10-27 DIAGNOSIS — Z6841 Body Mass Index (BMI) 40.0 and over, adult: Secondary | ICD-10-CM

## 2018-10-27 DIAGNOSIS — E559 Vitamin D deficiency, unspecified: Secondary | ICD-10-CM

## 2018-10-27 DIAGNOSIS — I10 Essential (primary) hypertension: Secondary | ICD-10-CM | POA: Diagnosis not present

## 2018-10-27 DIAGNOSIS — Z9189 Other specified personal risk factors, not elsewhere classified: Secondary | ICD-10-CM | POA: Diagnosis not present

## 2018-10-27 MED ORDER — VITAMIN D (ERGOCALCIFEROL) 1.25 MG (50000 UNIT) PO CAPS: 50000.0000 [IU] | ORAL_CAPSULE | ORAL | 0 refills | Status: DC

## 2018-10-27 MED FILL — VIT D2 1.25 MG (50,000 UNIT: 1.25 MG | 28 days supply | Qty: 4 | Fill #0

## 2018-10-27 NOTE — Progress Notes (Signed)
Office: [phone]  /  Fax: [phone]   HPI:   Chief Complaint: OBESITY [name] is here to discuss her progress with her obesity treatment plan. She is on the Category 3 plan and is following her eating plan approximately 90 % of the time. She states she is walking 20 to 30 minutes 2 to 3 times per week. [name] mostly followed the plan. She is drinking adequate water and she is getting adequate protein. Her weight is 237 lb (107.5 kg) today and has had a weight loss of 5 pounds over a period of 3 weeks since her last visit. She has lost 13 lbs since starting treatment with [name].  Vitamin D deficiency [name] has a diagnosis of vitamin D deficiency. She is currently taking vit D and denies nausea, vomiting or muscle weakness.  At risk for osteopenia and osteoporosis [name] is at higher risk of osteopenia and osteoporosis due to vitamin D deficiency.   Hypertension [name] is a 48 y.o. female with hypertension. Her blood pressure is at 98/66 today.She is currently taking Lisinopril-HCTZ. [name] denies lightheadedness. She is working weight loss to help control her blood pressure with the goal of decreasing her risk of heart attack and stroke. [name] blood pressure is well controlled.  ASSESSMENT AND PLAN:  Vitamin D deficiency - Plan: Vitamin D, Ergocalciferol, (DRISDOL) 1.25 MG (50000 UT) CAPS capsule  Essential hypertension  At risk for osteoporosis  Class 3 severe obesity with serious comorbidity and body mass index (BMI) of 40.0 to 44.9 in adult, unspecified obesity type ([place])  PLAN:  Vitamin D Deficiency [name] was informed that low vitamin D levels contributes to fatigue and are associated with obesity, breast, and colon cancer. She agrees to continue to take prescription Vit D @50 ,000 IU every week #4 with no refills and will follow up for routine testing of vitamin D, at least 2-3 times per year. She was informed of the risk of over-replacement of vitamin D and  agrees to not increase her dose unless she discusses this with [name] first. [name] agrees to follow up as directed.  At risk for osteopenia and osteoporosis [name] was given extended  (15 minutes) osteoporosis prevention counseling today. [name] is at risk for osteopenia and osteoporosis due to her vitamin D deficiency. She was encouraged to take her vitamin D and follow her higher calcium diet and increase strengthening exercise to help strengthen her bones and decrease her risk of osteopenia and osteoporosis.  Hypertension We discussed sodium restriction, working on healthy weight loss, and a regular exercise program as the means to achieve improved blood pressure control. [name] agreed with this plan and agreed to follow up as directed. We will continue to monitor her blood pressure as well as her progress with the above lifestyle modifications. She will continue her medications as prescribed and will watch for signs of hypotension as she continues her lifestyle modifications.  Obesity [name] is currently in the action stage of change. As such, her goal is to continue with weight loss efforts She has agreed to follow the Category 3 plan [name] has been instructed to work up to a goal of 150 minutes of combined cardio and strengthening exercise per week for weight loss and overall health benefits. We discussed the following Behavioral Modification Strategies today: increase H2O intake, no skipping meals, keeping healthy foods in the home, increasing lean protein intake, decreasing simple carbohydrates, increasing vegetables, decrease eating out and work on meal planning (using air fryer) and  easy cooking plans Additional lunch options were provided to patient today.  [name] has agreed to follow up with our clinic in 2 weeks. She was informed of the importance of frequent follow up visits to maximize her success with intensive lifestyle modifications for her multiple health conditions.  ALLERGIES: Allergies    Allergen Reactions  . Codeine Other (See Comments)    "whole body turned bright red, swelled a little" with V codiene  . Lyrica [Pregabalin] Other (See Comments)    "suicidal thoughts"  . Nyquil Multi-Symptom [Pseudoeph-Doxylamine-Dm-Apap] Other (See Comments)    Turns skin red *all over body*, pt reports getting very hot   . Sulfa Antibiotics Other (See Comments)    "body turned red"    MEDICATIONS: Current Outpatient Medications on File Prior to Visit  Medication Sig Dispense Refill  . acetaZOLAMIDE (DIAMOX) 250 MG tablet Take 2 tablets (500 mg total) by mouth 2 (two) times daily. 360 tablet 4  . albuterol (PROVENTIL HFA;VENTOLIN HFA) 108 (90 Base) MCG/ACT inhaler Inhale 2 puffs into the lungs every 4 (four) hours as needed for wheezing or shortness of breath. 1 Inhaler 0  . Calcium Carbonate-Vitamin D (CALCIUM + D PO) Take 1 tablet by mouth daily. Calcium 600 mg, vit D3 300 mg    . cetirizine (ZYRTEC) 5 MG tablet Take 5 mg by mouth daily.    . Cyanocobalamin (VITAMIN B 12 PO) Take 5,000 mcg by mouth daily.     . Echinacea 125 MG CAPS Take 125 mg by mouth daily.     . Flax Oil-Fish Oil-Borage Oil (FISH OIL-FLAX OIL-BORAGE OIL) CAPS Take 2 capsules by mouth daily.     Marland Kitchen lisinopril-hydrochlorothiazide (PRINZIDE,ZESTORETIC) 20-12.5 MG tablet Take 1 tablet by mouth daily. 90 tablet 3  . Magnesium 250 MG TABS Take 250 mg by mouth daily.     . metroNIDAZOLE (METROCREAM) 0.75 % cream Apply topically 2 (two) times daily. 45 g 1  . montelukast (SINGULAIR) 10 MG tablet Take 1 tablet (10 mg total) by mouth at bedtime. 90 tablet 3  . Multiple Vitamin (MULTIVITAMIN) capsule Take 1 capsule by mouth daily.    . Oxymetazoline HCl (NASAL SPRAY NA) Place 2 sprays into the nose as needed (CONGESTION).     Marland Kitchen potassium chloride (K-DUR,KLOR-CON) 10 MEQ tablet TAKE 1 TABLET BY MOUTH TWICE A DAY (NEED APPT FOR FOLLOW UP AND LAB WORK) 60 tablet 6   No current facility-administered medications on file prior  to visit.     PAST MEDICAL HISTORY: Past Medical History:  Diagnosis Date  . Asthma   . Chiari I malformation ([place])   . Chiari malformation 1996   decompression   . High blood pressure   . Hypertension   . Idiopathic intracranial hypertension   . Lactose intolerance   . Pseudotumor cerebri   . Subclavian bypass stenosis ([place]) 11/97   right  . Swallowing difficulty     PAST SURGICAL HISTORY: Past Surgical History:  Procedure Laterality Date  . APPENDECTOMY    . chiari decompression    . GANGLION CYST EXCISION Right    wrist  . LAPAROSCOPIC APPENDECTOMY N/A 10/28/2015   Procedure: APPENDECTOMY LAPAROSCOPIC;  Surgeon: Armandina Gemma, MD;  Location: [facility];  Service: General;  Laterality: N/A;  . SUBCLAVIAN BYPASS GRAFT Right 07/1996  . WRIST FRACTURE SURGERY Right 02/2007    SOCIAL HISTORY: Social History   Tobacco Use  . Smoking status: Never Smoker  . Smokeless tobacco: Never Used  Substance Use Topics  .  Alcohol use: No    Alcohol/week: 0.0 standard drinks  . Drug use: No    FAMILY HISTORY: Family History  Problem Relation Age of Onset  . Cataracts Mother   . Diabetes Mother   . Hypertension Mother   . High Cholesterol Mother   . Obesity Mother   . Diabetes Father   . Hypertension Father   . Obesity Father     ROS: Review of Systems  Constitutional: Positive for weight loss.  Gastrointestinal: Negative for nausea and vomiting.  Musculoskeletal:       Negative for muscle weakness  Neurological:       Negative for lightheadedness    PHYSICAL EXAM: Blood pressure 98/66, pulse 84, temperature 97.9 F (36.6 C), temperature source Oral, height 5\' 4"  (1.626 m), weight 237 lb (107.5 kg), SpO2 98 %. Body mass index is 40.68 kg/m. Physical Exam Vitals signs reviewed.  Constitutional:      Appearance: Normal appearance. She is well-developed. She is obese.  Cardiovascular:     Rate and Rhythm: Normal rate.  Pulmonary:     Effort: Pulmonary effort is  normal.  Musculoskeletal: Normal range of motion.  Skin:    General: Skin is warm and dry.  Neurological:     Mental Status: She is alert and oriented to person, place, and time.  Psychiatric:        Mood and Affect: Mood normal.        Behavior: Behavior normal.     RECENT LABS AND TESTS: BMET    Component Value Date/Time   NA 139 09/08/2018 0000   K 3.9 09/08/2018 0000   CL 108 (H) 09/08/2018 0000   CO2 16 (L) 09/08/2018 0000   GLUCOSE 79 09/08/2018 0000   GLUCOSE 104 (H) 10/28/2015 2043   BUN 12 09/08/2018 0000   CREATININE 0.81 09/08/2018 0000   CALCIUM 8.9 09/08/2018 0000   GFRNONAA 88 09/08/2018 0000   GFRAA 101 09/08/2018 0000   Lab Results  Component Value Date   HGBA1C 5.2 09/08/2018   Lab Results  Component Value Date   INSULIN 16.9 09/08/2018   CBC    Component Value Date/Time   WBC 7.2 07/28/2018 1302   WBC 15.0 (H) 10/28/2015 1931   RBC 4.59 07/28/2018 1302   RBC 4.37 10/28/2015 1931   HGB 14.2 07/28/2018 1302   HCT 42.3 07/28/2018 1302   PLT 243 07/28/2018 1302   MCV 92 07/28/2018 1302   MCH 30.9 07/28/2018 1302   MCH 30.2 10/28/2015 1931   MCHC 33.6 07/28/2018 1302   MCHC 34.0 10/28/2015 1931   RDW 12.0 (L) 07/28/2018 1302   LYMPHSABS 1.9 07/28/2018 1302   MONOABS 0.6 05/04/2015 0250   EOSABS 0.4 07/28/2018 1302   BASOSABS 0.0 07/28/2018 1302   Iron/TIBC/Ferritin/ %Sat No results found for: IRON, TIBC, FERRITIN, IRONPCTSAT Lipid Panel     Component Value Date/Time   CHOL 186 07/28/2018 1302   TRIG 124 07/28/2018 1302   HDL 51 07/28/2018 1302   CHOLHDL 3.6 07/28/2018 1302   LDLCALC 110 (H) 07/28/2018 1302   Hepatic Function Panel     Component Value Date/Time   PROT 7.0 09/08/2018 0000   ALBUMIN 4.4 09/08/2018 0000   AST 12 09/08/2018 0000   ALT 15 09/08/2018 0000   ALKPHOS 42 09/08/2018 0000   BILITOT 0.4 09/08/2018 0000      Component Value Date/Time   TSH 2.450 07/28/2018 1302   TSH 1.850 12/19/2016 1057   TSH 1.880  03/15/2015 0924   Results for ELTA, ANGELL (MRN [id number]) as of 10/27/2018 15:10  Ref. Range 09/08/2018 00:00  Vitamin D, 25-Hydroxy Latest Ref Range: 30.0 - 100.0 ng/mL 31.3     OBESITY BEHAVIORAL INTERVENTION VISIT  Today's visit was # 4   Starting weight: 250 lbs Starting date: 09/08/2018 Today's weight : 237 lbs Today's date: 10/27/2018 Total lbs lost to date: 13    10/27/2018  Height 5\' 4"  (1.626 m)  Weight 237 lb (107.5 kg)  BMI (Calculated) 40.66  BLOOD PRESSURE - SYSTOLIC 98  BLOOD PRESSURE - DIASTOLIC 66   Body Fat % 33.6 %  Total Body Water (lbs) 84.6 lbs   ASK: We discussed the diagnosis of obesity with [name] today and [place] agreed to give [name] permission to discuss obesity behavioral modification therapy today.  ASSESS: Lewis has the diagnosis of obesity and her BMI today is 40.66 Anila is in the action stage of change   ADVISE: Nolene was educated on the multiple health risks of obesity as well as the benefit of weight loss to improve her health. She was advised of the need for long term treatment and the importance of lifestyle modifications to improve her current health and to decrease her risk of future health problems.  AGREE: Multiple dietary modification options and treatment options were discussed and  Angell agreed to follow the recommendations documented in the above note.  ARRANGE: Celica was educated on the importance of frequent visits to treat obesity as outlined per CMS and USPSTF guidelines and agreed to schedule her next follow up appointment today.  Corey Skains, am acting as Location manager for [employer]. Owens Shark, DO  I have reviewed the above documentation for accuracy and completeness, and I agree with the above. -Jearld Lesch, DO

## 2018-10-29 MED FILL — acetaZOLAMIDE 250 MG TABS: 250 | 90 days supply | Qty: 360 | Fill #4

## 2018-11-09 MED FILL — MONTELUKAST SOD 10 MG TAB: 10 | 90 days supply | Qty: 90 | Fill #1

## 2018-11-09 MED FILL — POTASSIUM CHL ER M10 TABLET: 10 | 30 days supply | Qty: 60 | Fill #3

## 2018-11-12 ENCOUNTER — Encounter (INDEPENDENT_AMBULATORY_CARE_PROVIDER_SITE_OTHER): Payer: Self-pay | Admitting: Bariatrics

## 2018-11-12 ENCOUNTER — Other Ambulatory Visit: Payer: Self-pay

## 2018-11-12 ENCOUNTER — Ambulatory Visit (INDEPENDENT_AMBULATORY_CARE_PROVIDER_SITE_OTHER): Payer: 59 | Admitting: Bariatrics

## 2018-11-12 VITALS — BP 107/71 | HR 78 | Temp 97.6°F | Ht 64.0 in | Wt 235.0 lb

## 2018-11-12 DIAGNOSIS — I10 Essential (primary) hypertension: Secondary | ICD-10-CM

## 2018-11-12 DIAGNOSIS — E559 Vitamin D deficiency, unspecified: Secondary | ICD-10-CM

## 2018-11-12 DIAGNOSIS — Z6841 Body Mass Index (BMI) 40.0 and over, adult: Secondary | ICD-10-CM | POA: Diagnosis not present

## 2018-11-12 NOTE — Progress Notes (Signed)
Office: [phone]  /  Fax: [phone]   HPI:   Chief Complaint: OBESITY [name] is here to discuss her progress with her obesity treatment plan. She is on the Category 3 plan and is following her eating plan approximately 70 % of the time. She states she is walking for 10 to 15 minutes 3 to 4 times per week. [name] is doing well overall. She has had a sinus infection, but she is improving. [name] states that she could not eat the plan for four to five days. Her weight is 235 lb (106.6 kg) today and has had a weight loss of 2 pounds over a period of 2 weeks since her last visit. She has lost 15 lbs since starting treatment with [name].  Hypertension BLONDINE HOTTEL is a 48 y.o. female with hypertension. She is taking Lisinopril-HCTZ. [name] denies lightheadedness. She is working weight loss to help control her blood pressure with the goal of decreasing her risk of heart attack and stroke. [name] blood pressure is well controlled.  Vitamin D deficiency [name] has a diagnosis of vitamin D deficiency. She is currently taking vit D and denies nausea, vomiting or muscle weakness.  ASSESSMENT AND PLAN:  Essential hypertension  Vitamin D deficiency  Class 3 severe obesity with serious comorbidity and body mass index (BMI) of 40.0 to 44.9 in adult, unspecified obesity type ([place])  PLAN:  Hypertension We discussed sodium restriction, working on healthy weight loss, and a regular exercise program as the means to achieve improved blood pressure control. Gene agreed with this plan and agreed to follow up as directed. We will continue to monitor her blood pressure as well as her progress with the above lifestyle modifications. She will continue her medications as prescribed and will watch for signs of hypotension as she continues her lifestyle modifications.  Vitamin D Deficiency [name] was informed that low vitamin D levels contributes to fatigue and are associated with obesity, breast, and colon  cancer. She agrees to continue to take prescription Vit D @50 ,000 IU every week and will follow up for routine testing of vitamin D, at least 2-3 times per year. She was informed of the risk of over-replacement of vitamin D and agrees to not increase her dose unless she discusses this with [name] first.  I spent > than 50% of the 15 minute visit on counseling as documented in the note.  Obesity [name] is currently in the action stage of change. As such, her goal is to continue with weight loss efforts She has agreed to follow the Category 3 plan [name] has been instructed to work up to a goal of 150 minutes of combined cardio and strengthening exercise per week for weight loss and overall health benefits. We discussed the following Behavioral Modification Strategies today: increase H2O intake, no skipping meals, keeping healthy foods in the home, increasing lean protein intake, decreasing simple carbohydrates, increasing vegetables, decrease eating out and work on meal planning and easy cooking plans  [name] has agreed to follow up with our clinic in 2 weeks. She was informed of the importance of frequent follow up visits to maximize her success with intensive lifestyle modifications for her multiple health conditions.  ALLERGIES: Allergies  Allergen Reactions  . Codeine Other (See Comments)    "whole body turned bright red, swelled a little" with V codiene  . Lyrica [Pregabalin] Other (See Comments)    "suicidal thoughts"  . Nyquil Multi-Symptom [Pseudoeph-Doxylamine-Dm-Apap] Other (See Comments)    Turns skin  red *all over body*, pt reports getting very hot   . Sulfa Antibiotics Other (See Comments)    "body turned red"    MEDICATIONS: Current Outpatient Medications on File Prior to Visit  Medication Sig Dispense Refill  . acetaZOLAMIDE (DIAMOX) 250 MG tablet Take 2 tablets (500 mg total) by mouth 2 (two) times daily. 360 tablet 4  . albuterol (PROVENTIL HFA;VENTOLIN HFA) 108 (90 Base) MCG/ACT  inhaler Inhale 2 puffs into the lungs every 4 (four) hours as needed for wheezing or shortness of breath. 1 Inhaler 0  . Calcium Carbonate-Vitamin D (CALCIUM + D PO) Take 1 tablet by mouth daily. Calcium 600 mg, vit D3 300 mg    . cetirizine (ZYRTEC) 5 MG tablet Take 5 mg by mouth daily.    . Cyanocobalamin (VITAMIN B 12 PO) Take 5,000 mcg by mouth daily.     . Echinacea 125 MG CAPS Take 125 mg by mouth daily.     . Flax Oil-Fish Oil-Borage Oil (FISH OIL-FLAX OIL-BORAGE OIL) CAPS Take 2 capsules by mouth daily.     Marland Kitchen lisinopril-hydrochlorothiazide (PRINZIDE,ZESTORETIC) 20-12.5 MG tablet Take 1 tablet by mouth daily. 90 tablet 3  . Magnesium 250 MG TABS Take 250 mg by mouth daily.     . metroNIDAZOLE (METROCREAM) 0.75 % cream Apply topically 2 (two) times daily. 45 g 1  . montelukast (SINGULAIR) 10 MG tablet Take 1 tablet (10 mg total) by mouth at bedtime. 90 tablet 3  . Multiple Vitamin (MULTIVITAMIN) capsule Take 1 capsule by mouth daily.    . Oxymetazoline HCl (NASAL SPRAY NA) Place 2 sprays into the nose as needed (CONGESTION).     Marland Kitchen potassium chloride (K-DUR,KLOR-CON) 10 MEQ tablet TAKE 1 TABLET BY MOUTH TWICE A DAY (NEED APPT FOR FOLLOW UP AND LAB WORK) 60 tablet 6  . Vitamin D, Ergocalciferol, (DRISDOL) 1.25 MG (50000 UT) CAPS capsule Take 1 capsule (50,000 Units total) by mouth every 7 (seven) days. 4 capsule 0   No current facility-administered medications on file prior to visit.     PAST MEDICAL HISTORY: Past Medical History:  Diagnosis Date  . Asthma   . Chiari I malformation ([place])   . Chiari malformation 1996   decompression   . High blood pressure   . Hypertension   . Idiopathic intracranial hypertension   . Lactose intolerance   . Pseudotumor cerebri   . Subclavian bypass stenosis ([place]) 11/97   right  . Swallowing difficulty     PAST SURGICAL HISTORY: Past Surgical History:  Procedure Laterality Date  . APPENDECTOMY    . chiari decompression    . GANGLION CYST  EXCISION Right    wrist  . LAPAROSCOPIC APPENDECTOMY N/A 10/28/2015   Procedure: APPENDECTOMY LAPAROSCOPIC;  Surgeon: Armandina Gemma, MD;  Location: [facility];  Service: General;  Laterality: N/A;  . SUBCLAVIAN BYPASS GRAFT Right 07/1996  . WRIST FRACTURE SURGERY Right 02/2007    SOCIAL HISTORY: Social History   Tobacco Use  . Smoking status: Never Smoker  . Smokeless tobacco: Never Used  Substance Use Topics  . Alcohol use: No    Alcohol/week: 0.0 standard drinks  . Drug use: No    FAMILY HISTORY: Family History  Problem Relation Age of Onset  . Cataracts Mother   . Diabetes Mother   . Hypertension Mother   . High Cholesterol Mother   . Obesity Mother   . Diabetes Father   . Hypertension Father   . Obesity Father  ROS: Review of Systems  Constitutional: Positive for malaise/fatigue.  Gastrointestinal: Negative for nausea and vomiting.  Musculoskeletal:       Negative for muscle weakness  Neurological:       Negative for lightheadedness    PHYSICAL EXAM: Blood pressure 107/71, pulse 78, temperature 97.6 F (36.4 C), temperature source Oral, height 5\' 4"  (1.626 m), weight 235 lb (106.6 kg), SpO2 99 %. Body mass index is 40.34 kg/m. Physical Exam Vitals signs reviewed.  Constitutional:      Appearance: Normal appearance. She is well-developed. She is obese.  Cardiovascular:     Rate and Rhythm: Normal rate.  Pulmonary:     Effort: Pulmonary effort is normal.  Musculoskeletal: Normal range of motion.  Skin:    General: Skin is warm and dry.  Neurological:     Mental Status: She is alert and oriented to person, place, and time.  Psychiatric:        Mood and Affect: Mood normal.        Behavior: Behavior normal.     RECENT LABS AND TESTS: BMET    Component Value Date/Time   NA 139 09/08/2018 0000   K 3.9 09/08/2018 0000   CL 108 (H) 09/08/2018 0000   CO2 16 (L) 09/08/2018 0000   GLUCOSE 79 09/08/2018 0000   GLUCOSE 104 (H) 10/28/2015 2043   BUN 12  09/08/2018 0000   CREATININE 0.81 09/08/2018 0000   CALCIUM 8.9 09/08/2018 0000   GFRNONAA 88 09/08/2018 0000   GFRAA 101 09/08/2018 0000   Lab Results  Component Value Date   HGBA1C 5.2 09/08/2018   Lab Results  Component Value Date   INSULIN 16.9 09/08/2018   CBC    Component Value Date/Time   WBC 7.2 07/28/2018 1302   WBC 15.0 (H) 10/28/2015 1931   RBC 4.59 07/28/2018 1302   RBC 4.37 10/28/2015 1931   HGB 14.2 07/28/2018 1302   HCT 42.3 07/28/2018 1302   PLT 243 07/28/2018 1302   MCV 92 07/28/2018 1302   MCH 30.9 07/28/2018 1302   MCH 30.2 10/28/2015 1931   MCHC 33.6 07/28/2018 1302   MCHC 34.0 10/28/2015 1931   RDW 12.0 (L) 07/28/2018 1302   LYMPHSABS 1.9 07/28/2018 1302   MONOABS 0.6 05/04/2015 0250   EOSABS 0.4 07/28/2018 1302   BASOSABS 0.0 07/28/2018 1302   Iron/TIBC/Ferritin/ %Sat No results found for: IRON, TIBC, FERRITIN, IRONPCTSAT Lipid Panel     Component Value Date/Time   CHOL 186 07/28/2018 1302   TRIG 124 07/28/2018 1302   HDL 51 07/28/2018 1302   CHOLHDL 3.6 07/28/2018 1302   LDLCALC 110 (H) 07/28/2018 1302   Hepatic Function Panel     Component Value Date/Time   PROT 7.0 09/08/2018 0000   ALBUMIN 4.4 09/08/2018 0000   AST 12 09/08/2018 0000   ALT 15 09/08/2018 0000   ALKPHOS 42 09/08/2018 0000   BILITOT 0.4 09/08/2018 0000      Component Value Date/Time   TSH 2.450 07/28/2018 1302   TSH 1.850 12/19/2016 1057   TSH 1.880 03/15/2015 0924   Results for ARDIS, LAWLEY (MRN [id number]) as of 11/12/2018 13:58  Ref. Range 09/08/2018 00:00  Vitamin D, 25-Hydroxy Latest Ref Range: 30.0 - 100.0 ng/mL 31.3     OBESITY BEHAVIORAL INTERVENTION VISIT  Today's visit was # 5   Starting weight: 250 lbs Starting date: 09/08/2018 Today's weight : 235 lbs  Today's date: 11/12/2018 Total lbs lost to date: 15    11/12/2018  Height 5\' 4"  (1.626 m)  Weight 235 lb (106.6 kg)  BMI (Calculated) 40.32  BLOOD PRESSURE - SYSTOLIC 563  BLOOD PRESSURE  - DIASTOLIC 71   Body Fat % 89.3 %  Total Body Water (lbs) 86.6 lbs    ASK: We discussed the diagnosis of obesity with [name] today and [place] agreed to give [name] permission to discuss obesity behavioral modification therapy today.  ASSESS: Lindsey has the diagnosis of obesity and her BMI today is 40.32 Kallan is in the action stage of change   ADVISE: Romona was educated on the multiple health risks of obesity as well as the benefit of weight loss to improve her health. She was advised of the need for long term treatment and the importance of lifestyle modifications to improve her current health and to decrease her risk of future health problems.  AGREE: Multiple dietary modification options and treatment options were discussed and  Persephanie agreed to follow the recommendations documented in the above note.  ARRANGE: Arletta was educated on the importance of frequent visits to treat obesity as outlined per CMS and USPSTF guidelines and agreed to schedule her next follow up appointment today.  Corey Skains, am acting as Location manager for [employer]. Owens Shark, DO  I have reviewed the above documentation for accuracy and completeness, and I agree with the above. -Jearld Lesch, DO

## 2018-11-26 ENCOUNTER — Encounter (INDEPENDENT_AMBULATORY_CARE_PROVIDER_SITE_OTHER): Payer: Self-pay

## 2018-12-01 ENCOUNTER — Ambulatory Visit (INDEPENDENT_AMBULATORY_CARE_PROVIDER_SITE_OTHER): Payer: 59 | Admitting: Bariatrics

## 2018-12-01 ENCOUNTER — Other Ambulatory Visit: Payer: Self-pay

## 2018-12-01 ENCOUNTER — Encounter (INDEPENDENT_AMBULATORY_CARE_PROVIDER_SITE_OTHER): Payer: Self-pay | Admitting: Bariatrics

## 2018-12-01 DIAGNOSIS — Z6841 Body Mass Index (BMI) 40.0 and over, adult: Secondary | ICD-10-CM | POA: Diagnosis not present

## 2018-12-01 DIAGNOSIS — E8881 Metabolic syndrome: Secondary | ICD-10-CM

## 2018-12-01 DIAGNOSIS — E559 Vitamin D deficiency, unspecified: Secondary | ICD-10-CM | POA: Diagnosis not present

## 2018-12-01 MED ORDER — VITAMIN D (ERGOCALCIFEROL) 1.25 MG (50000 UNIT) PO CAPS: 50000.0000 [IU] | ORAL_CAPSULE | ORAL | 0 refills | Status: DC

## 2018-12-01 MED FILL — VIT D2 1.25 MG (50,000 UNIT: 1.25 MG | 28 days supply | Qty: 4 | Fill #0

## 2018-12-01 NOTE — Progress Notes (Addendum)
Office: [phone]  /  Fax: [phone] TeleHealth Visit:  [name] has consented to this TeleHealth visit today via telephone call. The patient is located at home, the provider is located at the [facility] office. The participants in this visit include the listed provider and patient and any and all parties involved.   HPI:   Chief Complaint: OBESITY [name] is here to discuss her progress with her obesity treatment plan. She is on the Category 3 plan and is following her eating plan approximately 85 % of the time. She states she is walking/stairs for 45 minutes 4 times per week. [name] states that she has not lost any weight, but she has stayed the same. She works at [employer] and she is "rolling with the stress". Her appetite is controlled. We were unable to weigh the patient today for this TeleHealth visit.She feels as if she has maintained weight since her last visit. She has lost 15 lbs since starting treatment with [name].  Vitamin D deficiency [name] has a diagnosis of vitamin D deficiency. Her last vitamin D level was at 31.3 She is currently taking vit D and denies nausea, vomiting or muscle weakness.  Insulin Resistance [name] has a diagnosis of insulin resistance based on her elevated fasting insulin level >5. Although [name]'s blood glucose readings are still under good control, insulin resistance puts her at greater risk of metabolic syndrome and diabetes. Her last A1c was at 5.2 and last insulin level was at 16.9 She is not taking metformin currently and continues to work on diet and exercise to decrease risk of diabetes. [name] denies polyphagia.  ASSESSMENT AND PLAN:  Vitamin D deficiency - Plan: Vitamin D, Ergocalciferol, (DRISDOL) 1.25 MG (50000 UT) CAPS capsule  Insulin resistance  Class 3 severe obesity with serious comorbidity and body mass index (BMI) of 40.0 to 44.9 in adult, unspecified obesity type ([place])  PLAN:  Vitamin D Deficiency [name] was  informed that low vitamin D levels contributes to fatigue and are associated with obesity, breast, and colon cancer. She agrees to continue to take prescription Vit D @50 ,000 IU every week #4 with no refills and will follow up for routine testing of vitamin D, at least 2-3 times per year. She was informed of the risk of over-replacement of vitamin D and agrees to not increase her dose unless she discusses this with [name] first. [name] agrees to follow up with our clinic in 2 weeks.  Insulin Resistance [name] will continue to work on weight loss, exercise, increasing lean protein and decreasing simple carbohydrates in her diet to help decrease the risk of diabetes. She was informed that eating too many simple carbohydrates or too many calories at one sitting increases the likelihood of GI side effects. [name] agreed to follow up with [name] as directed to monitor her progress.  Obesity [name] is currently in the action stage of change. As such, her goal is to continue with weight loss efforts She has agreed to follow the Category 3 plan [name] has been instructed to work up to a goal of 150 minutes of combined cardio and strengthening exercise per week for weight loss and overall health benefits. We discussed the following Behavioral Modification Strategies today: increase H2O intake, no skipping meals, keeping healthy foods in the home, increasing lean protein intake (substituting her meats for other protein), decreasing simple carbohydrates, increasing vegetables, decrease eating out and work on meal planning and easy cooking plans [name] will weigh with her scale  at home.  [name] has agreed to follow up with our clinic in 2 weeks. She was informed of the importance of frequent follow up visits to maximize her success with intensive lifestyle modifications for her multiple health conditions.  ALLERGIES: Allergies  Allergen Reactions  . Codeine Other (See Comments)    "whole body turned bright red, swelled a little"  with V codiene  . Lyrica [Pregabalin] Other (See Comments)    "suicidal thoughts"  . Nyquil Multi-Symptom [Pseudoeph-Doxylamine-Dm-Apap] Other (See Comments)    Turns skin red *all over body*, pt reports getting very hot   . Sulfa Antibiotics Other (See Comments)    "body turned red"    MEDICATIONS: Current Outpatient Medications on File Prior to Visit  Medication Sig Dispense Refill  . acetaZOLAMIDE (DIAMOX) 250 MG tablet Take 2 tablets (500 mg total) by mouth 2 (two) times daily. 360 tablet 4  . albuterol (PROVENTIL HFA;VENTOLIN HFA) 108 (90 Base) MCG/ACT inhaler Inhale 2 puffs into the lungs every 4 (four) hours as needed for wheezing or shortness of breath. 1 Inhaler 0  . Calcium Carbonate-Vitamin D (CALCIUM + D PO) Take 1 tablet by mouth daily. Calcium 600 mg, vit D3 300 mg    . cetirizine (ZYRTEC) 5 MG tablet Take 5 mg by mouth daily.    . Cyanocobalamin (VITAMIN B 12 PO) Take 5,000 mcg by mouth daily.     . Echinacea 125 MG CAPS Take 125 mg by mouth daily.     . Flax Oil-Fish Oil-Borage Oil (FISH OIL-FLAX OIL-BORAGE OIL) CAPS Take 2 capsules by mouth daily.     Marland Kitchen lisinopril-hydrochlorothiazide (PRINZIDE,ZESTORETIC) 20-12.5 MG tablet Take 1 tablet by mouth daily. 90 tablet 3  . Magnesium 250 MG TABS Take 250 mg by mouth daily.     . metroNIDAZOLE (METROCREAM) 0.75 % cream Apply topically 2 (two) times daily. 45 g 1  . montelukast (SINGULAIR) 10 MG tablet Take 1 tablet (10 mg total) by mouth at bedtime. 90 tablet 3  . Multiple Vitamin (MULTIVITAMIN) capsule Take 1 capsule by mouth daily.    . Oxymetazoline HCl (NASAL SPRAY NA) Place 2 sprays into the nose as needed (CONGESTION).     Marland Kitchen potassium chloride (K-DUR,KLOR-CON) 10 MEQ tablet TAKE 1 TABLET BY MOUTH TWICE A DAY (NEED APPT FOR FOLLOW UP AND LAB WORK) 60 tablet 6   No current facility-administered medications on file prior to visit.     PAST MEDICAL HISTORY: Past Medical History:  Diagnosis Date  . Asthma   . Chiari I  malformation ([place])   . Chiari malformation 1996   decompression   . High blood pressure   . Hypertension   . Idiopathic intracranial hypertension   . Lactose intolerance   . Pseudotumor cerebri   . Subclavian bypass stenosis ([place]) 11/97   right  . Swallowing difficulty     PAST SURGICAL HISTORY: Past Surgical History:  Procedure Laterality Date  . APPENDECTOMY    . chiari decompression    . GANGLION CYST EXCISION Right    wrist  . LAPAROSCOPIC APPENDECTOMY N/A 10/28/2015   Procedure: APPENDECTOMY LAPAROSCOPIC;  Surgeon: Armandina Gemma, MD;  Location: [facility];  Service: General;  Laterality: N/A;  . SUBCLAVIAN BYPASS GRAFT Right 07/1996  . WRIST FRACTURE SURGERY Right 02/2007    SOCIAL HISTORY: Social History   Tobacco Use  . Smoking status: Never Smoker  . Smokeless tobacco: Never Used  Substance Use Topics  . Alcohol use: No    Alcohol/week: 0.0 standard  drinks  . Drug use: No    FAMILY HISTORY: Family History  Problem Relation Age of Onset  . Cataracts Mother   . Diabetes Mother   . Hypertension Mother   . High Cholesterol Mother   . Obesity Mother   . Diabetes Father   . Hypertension Father   . Obesity Father     ROS: Review of Systems  Constitutional: Negative for weight loss.  Gastrointestinal: Negative for nausea and vomiting.  Musculoskeletal:       Negative for muscle weakness  Endo/Heme/Allergies:       Negative for polyphagia    PHYSICAL EXAM: Pt in no acute distress  RECENT LABS AND TESTS: BMET    Component Value Date/Time   NA 139 09/08/2018 0000   K 3.9 09/08/2018 0000   CL 108 (H) 09/08/2018 0000   CO2 16 (L) 09/08/2018 0000   GLUCOSE 79 09/08/2018 0000   GLUCOSE 104 (H) 10/28/2015 2043   BUN 12 09/08/2018 0000   CREATININE 0.81 09/08/2018 0000   CALCIUM 8.9 09/08/2018 0000   GFRNONAA 88 09/08/2018 0000   GFRAA 101 09/08/2018 0000   Lab Results  Component Value Date   HGBA1C 5.2 09/08/2018   Lab Results  Component Value Date    INSULIN 16.9 09/08/2018   CBC    Component Value Date/Time   WBC 7.2 07/28/2018 1302   WBC 15.0 (H) 10/28/2015 1931   RBC 4.59 07/28/2018 1302   RBC 4.37 10/28/2015 1931   HGB 14.2 07/28/2018 1302   HCT 42.3 07/28/2018 1302   PLT 243 07/28/2018 1302   MCV 92 07/28/2018 1302   MCH 30.9 07/28/2018 1302   MCH 30.2 10/28/2015 1931   MCHC 33.6 07/28/2018 1302   MCHC 34.0 10/28/2015 1931   RDW 12.0 (L) 07/28/2018 1302   LYMPHSABS 1.9 07/28/2018 1302   MONOABS 0.6 05/04/2015 0250   EOSABS 0.4 07/28/2018 1302   BASOSABS 0.0 07/28/2018 1302   Iron/TIBC/Ferritin/ %Sat No results found for: IRON, TIBC, FERRITIN, IRONPCTSAT Lipid Panel     Component Value Date/Time   CHOL 186 07/28/2018 1302   TRIG 124 07/28/2018 1302   HDL 51 07/28/2018 1302   CHOLHDL 3.6 07/28/2018 1302   LDLCALC 110 (H) 07/28/2018 1302   Hepatic Function Panel     Component Value Date/Time   PROT 7.0 09/08/2018 0000   ALBUMIN 4.4 09/08/2018 0000   AST 12 09/08/2018 0000   ALT 15 09/08/2018 0000   ALKPHOS 42 09/08/2018 0000   BILITOT 0.4 09/08/2018 0000      Component Value Date/Time   TSH 2.450 07/28/2018 1302   TSH 1.850 12/19/2016 1057   TSH 1.880 03/15/2015 0924   Results for NASHYA, GARLINGTON (MRN [id number]) as of 12/01/2018 16:33  Ref. Range 09/08/2018 00:00  Vitamin D, 25-Hydroxy Latest Ref Range: 30.0 - 100.0 ng/mL 31.3     I, Doreene Nest, am acting as Location manager for [employer]. Owens Shark, DO  I have reviewed the above documentation for accuracy and completeness, and I agree with the above. -Jearld Lesch, DO

## 2018-12-08 MED FILL — POTASSIUM CHL ER M10 TABLET: 10 | 30 days supply | Qty: 60 | Fill #4

## 2018-12-18 ENCOUNTER — Other Ambulatory Visit: Payer: Self-pay | Admitting: *Deleted

## 2018-12-18 DIAGNOSIS — J301 Allergic rhinitis due to pollen: Secondary | ICD-10-CM

## 2018-12-18 MED ORDER — ALBUTEROL SULFATE HFA 108 (90 BASE) MCG/ACT IN AERS: 2.0000 | INHALATION_SPRAY | RESPIRATORY_TRACT | 3 refills | Status: DC | PRN

## 2018-12-18 MED FILL — ALBUTEROL SULFATE HFA 108 (: 108 (90 BAS | 17 days supply | Qty: 9 | Fill #0

## 2019-01-06 ENCOUNTER — Encounter (INDEPENDENT_AMBULATORY_CARE_PROVIDER_SITE_OTHER): Payer: Self-pay | Admitting: Bariatrics

## 2019-01-06 ENCOUNTER — Ambulatory Visit (INDEPENDENT_AMBULATORY_CARE_PROVIDER_SITE_OTHER): Payer: 59 | Admitting: Bariatrics

## 2019-01-06 ENCOUNTER — Other Ambulatory Visit: Payer: Self-pay

## 2019-01-06 DIAGNOSIS — E8881 Metabolic syndrome: Secondary | ICD-10-CM | POA: Diagnosis not present

## 2019-01-06 DIAGNOSIS — E559 Vitamin D deficiency, unspecified: Secondary | ICD-10-CM | POA: Diagnosis not present

## 2019-01-06 DIAGNOSIS — Z6841 Body Mass Index (BMI) 40.0 and over, adult: Secondary | ICD-10-CM | POA: Diagnosis not present

## 2019-01-06 MED ORDER — VITAMIN D (ERGOCALCIFEROL) 1.25 MG (50000 UNIT) PO CAPS: 50000.0000 [IU] | ORAL_CAPSULE | ORAL | 0 refills | Status: DC

## 2019-01-06 MED FILL — VIT D2 1.25 MG (50,000 UNIT: 1.25 MG | 28 days supply | Qty: 4 | Fill #0

## 2019-01-07 NOTE — Progress Notes (Signed)
Office: [phone]  /  Fax: [phone] TeleHealth Visit:  [name] has verbally consented to this TeleHealth visit today. The patient is located at home, the provider is located at the [employer] [facility] office. The participants in this visit include the listed provider and patient and any and all parties involved. The visit was conducted today via telephone. [name] was unable to use realtime audiovisual technology today (unable to connect) and the telehealth visit was conducted via telephone.  HPI:   Chief Complaint: OBESITY [name] is here to discuss her progress with her obesity treatment plan. She is on the Category 3 plan and is following her eating plan approximately 80 % of the time. She states she is walking 20 minutes 4 times per week. [name] has lost 1 1/2 pounds (weight 232 lbs). She has been under a lot of stress (cat died today). She denies any stress eating. We were unable to weigh the patient today for this TeleHealth visit. She feels as if she has lost weight since her last visit. She has lost 18 lbs since starting treatment with [name].  Vitamin D deficiency [name] has a diagnosis of vitamin D deficiency. She is currently taking vit D and denies nausea, vomiting or muscle weakness.  Insulin Resistance [name] has a diagnosis of insulin resistance based on her elevated fasting insulin level >5. Although [name]'s blood glucose readings are still under good control, insulin resistance puts her at greater risk of metabolic syndrome and diabetes. Her last A1c was at 5.2 and her last insulin level was at 16.9 [name] is not on medications and she continues to work on diet and exercise to decrease risk of diabetes.  ASSESSMENT AND PLAN:  Vitamin D deficiency - Plan: Vitamin D, Ergocalciferol, (DRISDOL) 1.25 MG (50000 UT) CAPS capsule  Insulin resistance  Class 3 severe obesity with serious comorbidity and body mass index (BMI) of 40.0 to 44.9 in adult, unspecified obesity type  ([place])  PLAN:  Vitamin D Deficiency [name] was informed that low vitamin D levels contributes to fatigue and are associated with obesity, breast, and colon cancer. She agrees to continue to take prescription Vit D @50 ,000 IU every week #4 with no refills and will follow up for routine testing of vitamin D, at least 2-3 times per year. She was informed of the risk of over-replacement of vitamin D and agrees to not increase her dose unless she discusses this with [name] first. [name] agreed to follow up with our clinic in 2 weeks.  Insulin Resistance [name] will continue to work on weight loss, exercise, increasing lean protein and decreasing simple carbohydrates in her diet to help decrease the risk of diabetes. She was informed that eating too many simple carbohydrates or too many calories at one sitting increases the likelihood of GI side effects. [name] agreed to follow up with [name] as directed to monitor her progress.  Obesity [name] is currently in the action stage of change. As such, her goal is to continue with weight loss efforts She has agreed to follow the Category 3 plan [name] will continue exercise (walking) and she will use g for weight loss and overall health benefits. We discussed the following Behavioral Modification Strategies today: planning for success, increase H2O intake, no skipping meals, keeping healthy foods in the home, increasing lean protein intake, decreasing simple carbohydrates, increasing vegetables, decrease eating out and work on meal planning and easy cooking plans [name] will weigh herself at home and record before each visit.  [name] has agreed to follow up with our clinic in 2 weeks. She was informed of the importance of frequent follow up visits to maximize her success with intensive lifestyle modifications for her multiple health conditions.  ALLERGIES: Allergies  Allergen Reactions  . Codeine Other (See Comments)    "whole body turned bright red, swelled a little" with V  codiene  . Lyrica [Pregabalin] Other (See Comments)    "suicidal thoughts"  . Nyquil Multi-Symptom [Pseudoeph-Doxylamine-Dm-Apap] Other (See Comments)    Turns skin red *all over body*, pt reports getting very hot   . Sulfa Antibiotics Other (See Comments)    "body turned red"    MEDICATIONS: Current Outpatient Medications on File Prior to Visit  Medication Sig Dispense Refill  . acetaZOLAMIDE (DIAMOX) 250 MG tablet Take 2 tablets (500 mg total) by mouth 2 (two) times daily. 360 tablet 4  . albuterol (PROVENTIL HFA;VENTOLIN HFA) 108 (90 Base) MCG/ACT inhaler Inhale 2 puffs into the lungs every 4 (four) hours as needed for wheezing or shortness of breath. 3 Inhaler 3  . Calcium Carbonate-Vitamin D (CALCIUM + D PO) Take 1 tablet by mouth daily. Calcium 600 mg, vit D3 300 mg    . cetirizine (ZYRTEC) 5 MG tablet Take 5 mg by mouth daily.    . Cyanocobalamin (VITAMIN B 12 PO) Take 5,000 mcg by mouth daily.     . Echinacea 125 MG CAPS Take 125 mg by mouth daily.     . Flax Oil-Fish Oil-Borage Oil (FISH OIL-FLAX OIL-BORAGE OIL) CAPS Take 2 capsules by mouth daily.     Marland Kitchen lisinopril-hydrochlorothiazide (PRINZIDE,ZESTORETIC) 20-12.5 MG tablet Take 1 tablet by mouth daily. 90 tablet 3  . Magnesium 250 MG TABS Take 250 mg by mouth daily.     . metroNIDAZOLE (METROCREAM) 0.75 % cream Apply topically 2 (two) times daily. 45 g 1  . montelukast (SINGULAIR) 10 MG tablet Take 1 tablet (10 mg total) by mouth at bedtime. 90 tablet 3  . Multiple Vitamin (MULTIVITAMIN) capsule Take 1 capsule by mouth daily.    . Oxymetazoline HCl (NASAL SPRAY NA) Place 2 sprays into the nose as needed (CONGESTION).     Marland Kitchen potassium chloride (K-DUR,KLOR-CON) 10 MEQ tablet TAKE 1 TABLET BY MOUTH TWICE A DAY (NEED APPT FOR FOLLOW UP AND LAB WORK) 60 tablet 6   No current facility-administered medications on file prior to visit.     PAST MEDICAL HISTORY: Past Medical History:  Diagnosis Date  . Asthma   . Chiari I  malformation ([place])   . Chiari malformation 1996   decompression   . High blood pressure   . Hypertension   . Idiopathic intracranial hypertension   . Lactose intolerance   . Pseudotumor cerebri   . Subclavian bypass stenosis ([place]) 11/97   right  . Swallowing difficulty     PAST SURGICAL HISTORY: Past Surgical History:  Procedure Laterality Date  . APPENDECTOMY    . chiari decompression    . GANGLION CYST EXCISION Right    wrist  . LAPAROSCOPIC APPENDECTOMY N/A 10/28/2015   Procedure: APPENDECTOMY LAPAROSCOPIC;  Surgeon: Armandina Gemma, MD;  Location: [facility];  Service: General;  Laterality: N/A;  . SUBCLAVIAN BYPASS GRAFT Right 07/1996  . WRIST FRACTURE SURGERY Right 02/2007    SOCIAL HISTORY: Social History   Tobacco Use  . Smoking status: Never Smoker  . Smokeless tobacco: Never Used  Substance Use Topics  . Alcohol use: No    Alcohol/week: 0.0 standard drinks  .  Drug use: No    FAMILY HISTORY: Family History  Problem Relation Age of Onset  . Cataracts Mother   . Diabetes Mother   . Hypertension Mother   . High Cholesterol Mother   . Obesity Mother   . Diabetes Father   . Hypertension Father   . Obesity Father     ROS: Review of Systems  Constitutional: Positive for weight loss.  Gastrointestinal: Negative for nausea and vomiting.  Musculoskeletal:       Negative for muscle weakness  Psychiatric/Behavioral:       Positive for stress    PHYSICAL EXAM: Pt in no acute distress  RECENT LABS AND TESTS: BMET    Component Value Date/Time   NA 139 09/08/2018 0000   K 3.9 09/08/2018 0000   CL 108 (H) 09/08/2018 0000   CO2 16 (L) 09/08/2018 0000   GLUCOSE 79 09/08/2018 0000   GLUCOSE 104 (H) 10/28/2015 2043   BUN 12 09/08/2018 0000   CREATININE 0.81 09/08/2018 0000   CALCIUM 8.9 09/08/2018 0000   GFRNONAA 88 09/08/2018 0000   GFRAA 101 09/08/2018 0000   Lab Results  Component Value Date   HGBA1C 5.2 09/08/2018   Lab Results  Component Value Date    INSULIN 16.9 09/08/2018   CBC    Component Value Date/Time   WBC 7.2 07/28/2018 1302   WBC 15.0 (H) 10/28/2015 1931   RBC 4.59 07/28/2018 1302   RBC 4.37 10/28/2015 1931   HGB 14.2 07/28/2018 1302   HCT 42.3 07/28/2018 1302   PLT 243 07/28/2018 1302   MCV 92 07/28/2018 1302   MCH 30.9 07/28/2018 1302   MCH 30.2 10/28/2015 1931   MCHC 33.6 07/28/2018 1302   MCHC 34.0 10/28/2015 1931   RDW 12.0 (L) 07/28/2018 1302   LYMPHSABS 1.9 07/28/2018 1302   MONOABS 0.6 05/04/2015 0250   EOSABS 0.4 07/28/2018 1302   BASOSABS 0.0 07/28/2018 1302   Iron/TIBC/Ferritin/ %Sat No results found for: IRON, TIBC, FERRITIN, IRONPCTSAT Lipid Panel     Component Value Date/Time   CHOL 186 07/28/2018 1302   TRIG 124 07/28/2018 1302   HDL 51 07/28/2018 1302   CHOLHDL 3.6 07/28/2018 1302   LDLCALC 110 (H) 07/28/2018 1302   Hepatic Function Panel     Component Value Date/Time   PROT 7.0 09/08/2018 0000   ALBUMIN 4.4 09/08/2018 0000   AST 12 09/08/2018 0000   ALT 15 09/08/2018 0000   ALKPHOS 42 09/08/2018 0000   BILITOT 0.4 09/08/2018 0000      Component Value Date/Time   TSH 2.450 07/28/2018 1302   TSH 1.850 12/19/2016 1057   TSH 1.880 03/15/2015 0924     Ref. Range 09/08/2018 00:00  Vitamin D, 25-Hydroxy Latest Ref Range: 30.0 - 100.0 ng/mL 31.3    I, Doreene Nest, am acting as Location manager for [employer]. Owens Shark, DO  I have reviewed the above documentation for accuracy and completeness, and I agree with the above. -Jearld Lesch, DO

## 2019-01-12 MED FILL — LISINOPRIL-HCTZ 20-12.5 MG: 20-12.5 | 90 days supply | Qty: 90 | Fill #1

## 2019-01-20 ENCOUNTER — Ambulatory Visit (INDEPENDENT_AMBULATORY_CARE_PROVIDER_SITE_OTHER): Payer: 59 | Admitting: Bariatrics

## 2019-01-20 ENCOUNTER — Encounter (INDEPENDENT_AMBULATORY_CARE_PROVIDER_SITE_OTHER): Payer: Self-pay | Admitting: Bariatrics

## 2019-01-20 ENCOUNTER — Other Ambulatory Visit: Payer: Self-pay

## 2019-01-20 DIAGNOSIS — E8881 Metabolic syndrome: Secondary | ICD-10-CM | POA: Diagnosis not present

## 2019-01-20 DIAGNOSIS — Z6841 Body Mass Index (BMI) 40.0 and over, adult: Secondary | ICD-10-CM | POA: Diagnosis not present

## 2019-01-20 DIAGNOSIS — E559 Vitamin D deficiency, unspecified: Secondary | ICD-10-CM

## 2019-01-21 NOTE — Progress Notes (Signed)
Office: [phone]  /  Fax: [phone] TeleHealth Visit:  [name] has verbally consented to this TeleHealth visit today. The patient is located at home, the provider is located at the [employer] [facility] office. The participants in this visit include the listed provider and patient and any and all parties involved. The visit was conducted today via WebEx.  HPI:   Chief Complaint: OBESITY [name] is here to discuss her progress with her obesity treatment plan. She is on the Category 3 plan and is following her eating plan approximately 85 % of the time. She states she is doing planks and walking for 20 minutes 5 times per week. [name] states that she has lost 0.7 pounds since the last visit (weight 232 lbs). She is doing well overall. [name] denies any stress eating. We were unable to weigh the patient today for this TeleHealth visit. She feels as if she has lost weight since her last visit. She has lost 18 lbs since starting treatment with [name].  Vitamin D deficiency [name] has a diagnosis of vitamin D deficiency. Her last vitamin D level was at 31.3 She is currently taking vit D and denies nausea, vomiting or muscle weakness.  Insulin Resistance [name] has a diagnosis of insulin resistance based on her elevated fasting insulin level >5. Although [name]'s blood glucose readings are still under good control, insulin resistance puts her at greater risk of metabolic syndrome and diabetes. Her last Hgb A1c was at 5.2 and last insulin level was at 16.9 She is not taking metformin currently and continues to work on diet and exercise to decrease risk of diabetes.  ASSESSMENT AND PLAN:  Vitamin D deficiency - Plan: VITAMIN D 25 Hydroxy (Vit-D Deficiency, Fractures)  Insulin resistance - Plan: Comprehensive metabolic panel, Insulin, random, Hemoglobin A1c  Class 3 severe obesity with serious comorbidity and body mass index (BMI) of 40.0 to 44.9 in adult, unspecified obesity type ([place])  PLAN:   Vitamin D Deficiency [name] was informed that low vitamin D levels contributes to fatigue and are associated with obesity, breast, and colon cancer. She agrees to continue to take prescription Vit D @50 ,000 IU every week and will follow up for routine testing of vitamin D, at least 2-3 times per year. She was informed of the risk of over-replacement of vitamin D and agrees to not increase her dose unless she discusses this with [name] first. We will check vitamin D level and [name] will follow up as directed.  Insulin Resistance [name] will continue to work on weight loss, exercise, increasing lean protein and decreasing simple carbohydrates in her diet to help decrease the risk of diabetes. She was informed that eating too many simple carbohydrates or too many calories at one sitting increases the likelihood of GI side effects. We will check Hgb A1c and insulin level and [name] agreed to follow up with [name] as directed to monitor her progress.  Obesity [name] is currently in the action stage of change. As such, her goal is to continue with weight loss efforts She has agreed to follow the Category 3 plan [name] will continue her exercise regimen for weight loss and overall health benefits. We discussed the following Behavioral Modification Strategies today: increase H2O intake, no skipping meals, keeping healthy foods in the home, increasing lean protein intake, decreasing simple carbohydrates, increasing vegetables, decrease eating out, work on meal planning and easy cooking plans and decrease liquid calories [name] will weigh herself at home before each visit.  [name]  has agreed to follow up with our clinic in 2 weeks. She was informed of the importance of frequent follow up visits to maximize her success with intensive lifestyle modifications for her multiple health conditions.  ALLERGIES: Allergies  Allergen Reactions  . Codeine Other (See Comments)    "whole body turned bright red, swelled a little" with V  codiene  . Lyrica [Pregabalin] Other (See Comments)    "suicidal thoughts"  . Nyquil Multi-Symptom [Pseudoeph-Doxylamine-Dm-Apap] Other (See Comments)    Turns skin red *all over body*, pt reports getting very hot   . Sulfa Antibiotics Other (See Comments)    "body turned red"    MEDICATIONS: Current Outpatient Medications on File Prior to Visit  Medication Sig Dispense Refill  . acetaZOLAMIDE (DIAMOX) 250 MG tablet Take 2 tablets (500 mg total) by mouth 2 (two) times daily. 360 tablet 4  . albuterol (PROVENTIL HFA;VENTOLIN HFA) 108 (90 Base) MCG/ACT inhaler Inhale 2 puffs into the lungs every 4 (four) hours as needed for wheezing or shortness of breath. 3 Inhaler 3  . Calcium Carbonate-Vitamin D (CALCIUM + D PO) Take 1 tablet by mouth daily. Calcium 600 mg, vit D3 300 mg    . cetirizine (ZYRTEC) 5 MG tablet Take 5 mg by mouth daily.    . Cyanocobalamin (VITAMIN B 12 PO) Take 5,000 mcg by mouth daily.     . Echinacea 125 MG CAPS Take 125 mg by mouth daily.     . Flax Oil-Fish Oil-Borage Oil (FISH OIL-FLAX OIL-BORAGE OIL) CAPS Take 2 capsules by mouth daily.     Marland Kitchen lisinopril-hydrochlorothiazide (PRINZIDE,ZESTORETIC) 20-12.5 MG tablet Take 1 tablet by mouth daily. 90 tablet 3  . Magnesium 250 MG TABS Take 250 mg by mouth daily.     . metroNIDAZOLE (METROCREAM) 0.75 % cream Apply topically 2 (two) times daily. 45 g 1  . montelukast (SINGULAIR) 10 MG tablet Take 1 tablet (10 mg total) by mouth at bedtime. 90 tablet 3  . Multiple Vitamin (MULTIVITAMIN) capsule Take 1 capsule by mouth daily.    . Oxymetazoline HCl (NASAL SPRAY NA) Place 2 sprays into the nose as needed (CONGESTION).     Marland Kitchen potassium chloride (K-DUR,KLOR-CON) 10 MEQ tablet TAKE 1 TABLET BY MOUTH TWICE A DAY (NEED APPT FOR FOLLOW UP AND LAB WORK) 60 tablet 6  . Vitamin D, Ergocalciferol, (DRISDOL) 1.25 MG (50000 UT) CAPS capsule Take 1 capsule (50,000 Units total) by mouth every 7 (seven) days. 4 capsule 0   No current  facility-administered medications on file prior to visit.     PAST MEDICAL HISTORY: Past Medical History:  Diagnosis Date  . Asthma   . Chiari I malformation ([place])   . Chiari malformation 1996   decompression   . High blood pressure   . Hypertension   . Idiopathic intracranial hypertension   . Lactose intolerance   . Pseudotumor cerebri   . Subclavian bypass stenosis ([place]) 11/97   right  . Swallowing difficulty     PAST SURGICAL HISTORY: Past Surgical History:  Procedure Laterality Date  . APPENDECTOMY    . chiari decompression    . GANGLION CYST EXCISION Right    wrist  . LAPAROSCOPIC APPENDECTOMY N/A 10/28/2015   Procedure: APPENDECTOMY LAPAROSCOPIC;  Surgeon: Armandina Gemma, MD;  Location: [facility];  Service: General;  Laterality: N/A;  . SUBCLAVIAN BYPASS GRAFT Right 07/1996  . WRIST FRACTURE SURGERY Right 02/2007    SOCIAL HISTORY: Social History   Tobacco Use  . Smoking status:  Never Smoker  . Smokeless tobacco: Never Used  Substance Use Topics  . Alcohol use: No    Alcohol/week: 0.0 standard drinks  . Drug use: No    FAMILY HISTORY: Family History  Problem Relation Age of Onset  . Cataracts Mother   . Diabetes Mother   . Hypertension Mother   . High Cholesterol Mother   . Obesity Mother   . Diabetes Father   . Hypertension Father   . Obesity Father     ROS: Review of Systems  Constitutional: Positive for weight loss.  Gastrointestinal: Negative for nausea and vomiting.  Musculoskeletal:       Negative for muscle weakness    PHYSICAL EXAM: Pt in no acute distress  RECENT LABS AND TESTS: BMET    Component Value Date/Time   NA 139 09/08/2018 0000   K 3.9 09/08/2018 0000   CL 108 (H) 09/08/2018 0000   CO2 16 (L) 09/08/2018 0000   GLUCOSE 79 09/08/2018 0000   GLUCOSE 104 (H) 10/28/2015 2043   BUN 12 09/08/2018 0000   CREATININE 0.81 09/08/2018 0000   CALCIUM 8.9 09/08/2018 0000   GFRNONAA 88 09/08/2018 0000   GFRAA 101 09/08/2018 0000    Lab Results  Component Value Date   HGBA1C 5.2 09/08/2018   Lab Results  Component Value Date   INSULIN 16.9 09/08/2018   CBC    Component Value Date/Time   WBC 7.2 07/28/2018 1302   WBC 15.0 (H) 10/28/2015 1931   RBC 4.59 07/28/2018 1302   RBC 4.37 10/28/2015 1931   HGB 14.2 07/28/2018 1302   HCT 42.3 07/28/2018 1302   PLT 243 07/28/2018 1302   MCV 92 07/28/2018 1302   MCH 30.9 07/28/2018 1302   MCH 30.2 10/28/2015 1931   MCHC 33.6 07/28/2018 1302   MCHC 34.0 10/28/2015 1931   RDW 12.0 (L) 07/28/2018 1302   LYMPHSABS 1.9 07/28/2018 1302   MONOABS 0.6 05/04/2015 0250   EOSABS 0.4 07/28/2018 1302   BASOSABS 0.0 07/28/2018 1302   Iron/TIBC/Ferritin/ %Sat No results found for: IRON, TIBC, FERRITIN, IRONPCTSAT Lipid Panel     Component Value Date/Time   CHOL 186 07/28/2018 1302   TRIG 124 07/28/2018 1302   HDL 51 07/28/2018 1302   CHOLHDL 3.6 07/28/2018 1302   LDLCALC 110 (H) 07/28/2018 1302   Hepatic Function Panel     Component Value Date/Time   PROT 7.0 09/08/2018 0000   ALBUMIN 4.4 09/08/2018 0000   AST 12 09/08/2018 0000   ALT 15 09/08/2018 0000   ALKPHOS 42 09/08/2018 0000   BILITOT 0.4 09/08/2018 0000      Component Value Date/Time   TSH 2.450 07/28/2018 1302   TSH 1.850 12/19/2016 1057   TSH 1.880 03/15/2015 0924     Ref. Range 09/08/2018 00:00  Vitamin D, 25-Hydroxy Latest Ref Range: 30.0 - 100.0 ng/mL 31.3    I, Doreene Nest, am acting as Location manager for [employer]. Owens Shark, DO  I have reviewed the above documentation for accuracy and completeness, and I agree with the above. -Jearld Lesch, DO

## 2019-01-27 MED FILL — acetaZOLAMIDE 250 MG TABS: 250 | 90 days supply | Qty: 360 | Fill #0

## 2019-01-29 DIAGNOSIS — E559 Vitamin D deficiency, unspecified: Secondary | ICD-10-CM | POA: Diagnosis not present

## 2019-01-29 DIAGNOSIS — E8881 Metabolic syndrome: Secondary | ICD-10-CM | POA: Diagnosis not present

## 2019-01-30 LAB — COMPREHENSIVE METABOLIC PANEL
ALT: 15 IU/L (ref 0–32)
AST: 8 IU/L (ref 0–40)
Albumin/Globulin Ratio: 1.9 (ref 1.2–2.2)
Albumin: 4.3 g/dL (ref 3.8–4.8)
Alkaline Phosphatase: 39 IU/L (ref 39–117)
BUN/Creatinine Ratio: 30 — ABNORMAL HIGH (ref 9–23)
BUN: 25 mg/dL — ABNORMAL HIGH (ref 6–24)
Bilirubin Total: 0.5 mg/dL (ref 0.0–1.2)
CO2: 18 mmol/L — ABNORMAL LOW (ref 20–29)
Calcium: 9.1 mg/dL (ref 8.7–10.2)
Chloride: 104 mmol/L (ref 96–106)
Creatinine, Ser: 0.83 mg/dL (ref 0.57–1.00)
GFR calc Af Amer: 98 mL/min/{1.73_m2} (ref >59)
GFR calc non Af Amer: 85 mL/min/{1.73_m2} (ref >59)
Globulin, Total: 2.3 g/dL (ref 1.5–4.5)
Glucose: 85 mg/dL (ref 65–99)
Potassium: 3.4 mmol/L — ABNORMAL LOW (ref 3.5–5.2)
Sodium: 138 mmol/L (ref 134–144)
Total Protein: 6.6 g/dL (ref 6.0–8.5)

## 2019-01-30 LAB — HEMOGLOBIN A1C
Est. average glucose Bld gHb Est-mCnc: 100 mg/dL
Hgb A1c MFr Bld: 5.1 % (ref 4.8–5.6)

## 2019-01-30 LAB — INSULIN, RANDOM: INSULIN: 16.1 u[IU]/mL (ref 2.6–24.9)

## 2019-01-30 LAB — VITAMIN D 25 HYDROXY (VIT D DEFICIENCY, FRACTURES): Vit D, 25-Hydroxy: 63.6 ng/mL (ref 30.0–100.0)

## 2019-02-03 ENCOUNTER — Other Ambulatory Visit: Payer: Self-pay

## 2019-02-03 ENCOUNTER — Encounter (INDEPENDENT_AMBULATORY_CARE_PROVIDER_SITE_OTHER): Payer: Self-pay | Admitting: Bariatrics

## 2019-02-03 ENCOUNTER — Ambulatory Visit (INDEPENDENT_AMBULATORY_CARE_PROVIDER_SITE_OTHER): Payer: 59 | Admitting: Bariatrics

## 2019-02-03 DIAGNOSIS — E559 Vitamin D deficiency, unspecified: Secondary | ICD-10-CM | POA: Diagnosis not present

## 2019-02-03 DIAGNOSIS — Z6841 Body Mass Index (BMI) 40.0 and over, adult: Secondary | ICD-10-CM | POA: Diagnosis not present

## 2019-02-03 DIAGNOSIS — E8881 Metabolic syndrome: Secondary | ICD-10-CM

## 2019-02-03 NOTE — Progress Notes (Signed)
Office: [phone]  /  Fax: [phone] TeleHealth Visit:  [name] has verbally consented to this TeleHealth visit today. The patient is located at home, the provider is located at the [employer] [facility] office. The participants in this visit include the listed provider and patient and any and all parties involved. The visit was conducted today via WebEx.  HPI:   Chief Complaint: OBESITY [name] is here to discuss her progress with her obesity treatment plan. She is on the Category 3 plan and is following her eating plan approximately 85 % of the time. She states she is walking 15 minutes 3 times per week. [name] states that she has lost 1 pound (weight 231 lbs). She denies any struggles with the plan. She denies boredom or stress eating.  We were unable to weigh the patient today for this TeleHealth visit. She feels as if she has lost weight since her last visit. She has lost 19 lbs since starting treatment with [name].  Vitamin D deficiency [name] has a diagnosis of vitamin D deficiency. Her last vitamin D level was at 63.6 [name] was taking high dose vit D and she denies nausea, vomiting or muscle weakness.  Insulin Resistance [name] has a diagnosis of insulin resistance based on her elevated fasting insulin level >5. Although [name]'s blood glucose readings are still under good control, insulin resistance puts her at greater risk of metabolic syndrome and diabetes. Her last insulin level was at 16.1, which is down from 16.9. Her last A1c was at 5.1, and is down from 5.2 She is not taking medications currently and continues to work on diet and exercise to decrease risk of diabetes.  ASSESSMENT AND PLAN:  Vitamin D deficiency  Insulin resistance  Class 3 severe obesity with serious comorbidity and body mass index (BMI) of 40.0 to 44.9 in adult, unspecified obesity type ([name])  PLAN:  Vitamin D Deficiency [name] was informed that low vitamin D levels contributes to fatigue and are  associated with obesity, breast, and colon cancer. She will continue Vitamin D (OTC) @2 ,000 IU daily and she will no longer take prescription Vitamin D. [name] will follow up for routine testing of vitamin D, at least 2-3 times per year. She was informed of the risk of over-replacement of vitamin D and agrees to not increase her dose unless she discusses this with [name] first. [name] agrees to follow up as directed.  Insulin Resistance [name] will continue to work on weight loss, exercise, increasing lean protein and decreasing simple carbohydrates in her diet to help decrease the risk of diabetes. She was informed that eating too many simple carbohydrates or too many calories at one sitting increases the likelihood of GI side effects. [name] agreed to follow up with [name] as directed to monitor her progress.  Obesity [name] is currently in the action stage of change. As such, her goal is to continue with weight loss efforts She has agreed to follow the Category 3 plan [name] has been instructed to work up to a goal of 150 minutes of combined cardio and strengthening exercise per week for weight loss and overall health benefits. We discussed the following Behavioral Modification Strategies today: increase H2O intake, no skipping meals, keeping healthy foods in the home, increasing lean protein intake, decreasing simple carbohydrates, increasing vegetables, decrease eating out and work on meal planning and easy cooking plans [name] will weigh herself at home and record.  [name] has agreed to follow up with our clinic in 2  weeks. She was informed of the importance of frequent follow up visits to maximize her success with intensive lifestyle modifications for her multiple health conditions.  ALLERGIES: Allergies  Allergen Reactions  . Codeine Other (See Comments)    "whole body turned bright red, swelled a little" with V codiene  . Lyrica [Pregabalin] Other (See Comments)    "suicidal thoughts"  . Nyquil Multi-Symptom  [Pseudoeph-Doxylamine-Dm-Apap] Other (See Comments)    Turns skin red *all over body*, pt reports getting very hot   . Sulfa Antibiotics Other (See Comments)    "body turned red"    MEDICATIONS: Current Outpatient Medications on File Prior to Visit  Medication Sig Dispense Refill  . acetaZOLAMIDE (DIAMOX) 250 MG tablet Take 2 tablets (500 mg total) by mouth 2 (two) times daily. 360 tablet 4  . albuterol (PROVENTIL HFA;VENTOLIN HFA) 108 (90 Base) MCG/ACT inhaler Inhale 2 puffs into the lungs every 4 (four) hours as needed for wheezing or shortness of breath. 3 Inhaler 3  . Calcium Carbonate-Vitamin D (CALCIUM + D PO) Take 1 tablet by mouth daily. Calcium 600 mg, vit D3 300 mg    . cetirizine (ZYRTEC) 5 MG tablet Take 5 mg by mouth daily.    . Cyanocobalamin (VITAMIN B 12 PO) Take 5,000 mcg by mouth daily.     . Echinacea 125 MG CAPS Take 125 mg by mouth daily.     . Flax Oil-Fish Oil-Borage Oil (FISH OIL-FLAX OIL-BORAGE OIL) CAPS Take 2 capsules by mouth daily.     Marland Kitchen lisinopril-hydrochlorothiazide (PRINZIDE,ZESTORETIC) 20-12.5 MG tablet Take 1 tablet by mouth daily. 90 tablet 3  . Magnesium 250 MG TABS Take 250 mg by mouth daily.     . metroNIDAZOLE (METROCREAM) 0.75 % cream Apply topically 2 (two) times daily. 45 g 1  . montelukast (SINGULAIR) 10 MG tablet Take 1 tablet (10 mg total) by mouth at bedtime. 90 tablet 3  . Multiple Vitamin (MULTIVITAMIN) capsule Take 1 capsule by mouth daily.    . Oxymetazoline HCl (NASAL SPRAY NA) Place 2 sprays into the nose as needed (CONGESTION).     Marland Kitchen potassium chloride (K-DUR,KLOR-CON) 10 MEQ tablet TAKE 1 TABLET BY MOUTH TWICE A DAY (NEED APPT FOR FOLLOW UP AND LAB WORK) 60 tablet 6  . Vitamin D, Ergocalciferol, (DRISDOL) 1.25 MG (50000 UT) CAPS capsule Take 1 capsule (50,000 Units total) by mouth every 7 (seven) days. 4 capsule 0   No current facility-administered medications on file prior to visit.     PAST MEDICAL HISTORY: Past Medical History:   Diagnosis Date  . Asthma   . Chiari I malformation ([place])   . Chiari malformation 1996   decompression   . High blood pressure   . Hypertension   . Idiopathic intracranial hypertension   . Lactose intolerance   . Pseudotumor cerebri   . Subclavian bypass stenosis ([place]) 11/97   right  . Swallowing difficulty     PAST SURGICAL HISTORY: Past Surgical History:  Procedure Laterality Date  . APPENDECTOMY    . chiari decompression    . GANGLION CYST EXCISION Right    wrist  . LAPAROSCOPIC APPENDECTOMY N/A 10/28/2015   Procedure: APPENDECTOMY LAPAROSCOPIC;  Surgeon: Armandina Gemma, MD;  Location: [facility];  Service: General;  Laterality: N/A;  . SUBCLAVIAN BYPASS GRAFT Right 07/1996  . WRIST FRACTURE SURGERY Right 02/2007    SOCIAL HISTORY: Social History   Tobacco Use  . Smoking status: Never Smoker  . Smokeless tobacco: Never Used  Substance  Use Topics  . Alcohol use: No    Alcohol/week: 0.0 standard drinks  . Drug use: No    FAMILY HISTORY: Family History  Problem Relation Age of Onset  . Cataracts Mother   . Diabetes Mother   . Hypertension Mother   . High Cholesterol Mother   . Obesity Mother   . Diabetes Father   . Hypertension Father   . Obesity Father     ROS: Review of Systems  Constitutional: Positive for weight loss.  Gastrointestinal: Negative for nausea and vomiting.  Musculoskeletal:       Negative for muscle weakness    PHYSICAL EXAM: Pt in no acute distress  RECENT LABS AND TESTS: BMET    Component Value Date/Time   NA 138 01/29/2019 0836   K 3.4 (L) 01/29/2019 0836   CL 104 01/29/2019 0836   CO2 18 (L) 01/29/2019 0836   GLUCOSE 85 01/29/2019 0836   GLUCOSE 104 (H) 10/28/2015 2043   BUN 25 (H) 01/29/2019 0836   CREATININE 0.83 01/29/2019 0836   CALCIUM 9.1 01/29/2019 0836   GFRNONAA 85 01/29/2019 0836   GFRAA 98 01/29/2019 0836   Lab Results  Component Value Date   HGBA1C 5.1 01/29/2019   HGBA1C 5.2 09/08/2018   Lab Results   Component Value Date   INSULIN 16.1 01/29/2019   INSULIN 16.9 09/08/2018   CBC    Component Value Date/Time   WBC 7.2 07/28/2018 1302   WBC 15.0 (H) 10/28/2015 1931   RBC 4.59 07/28/2018 1302   RBC 4.37 10/28/2015 1931   HGB 14.2 07/28/2018 1302   HCT 42.3 07/28/2018 1302   PLT 243 07/28/2018 1302   MCV 92 07/28/2018 1302   MCH 30.9 07/28/2018 1302   MCH 30.2 10/28/2015 1931   MCHC 33.6 07/28/2018 1302   MCHC 34.0 10/28/2015 1931   RDW 12.0 (L) 07/28/2018 1302   LYMPHSABS 1.9 07/28/2018 1302   MONOABS 0.6 05/04/2015 0250   EOSABS 0.4 07/28/2018 1302   BASOSABS 0.0 07/28/2018 1302   Iron/TIBC/Ferritin/ %Sat No results found for: IRON, TIBC, FERRITIN, IRONPCTSAT Lipid Panel     Component Value Date/Time   CHOL 186 07/28/2018 1302   TRIG 124 07/28/2018 1302   HDL 51 07/28/2018 1302   CHOLHDL 3.6 07/28/2018 1302   LDLCALC 110 (H) 07/28/2018 1302   Hepatic Function Panel     Component Value Date/Time   PROT 6.6 01/29/2019 0836   ALBUMIN 4.3 01/29/2019 0836   AST 8 01/29/2019 0836   ALT 15 01/29/2019 0836   ALKPHOS 39 01/29/2019 0836   BILITOT 0.5 01/29/2019 0836      Component Value Date/Time   TSH 2.450 07/28/2018 1302   TSH 1.850 12/19/2016 1057   TSH 1.880 03/15/2015 0924    Results for BRITTLEY, REGNER (MRN [id number]) as of 02/03/2019 15:12  Ref. Range 01/29/2019 08:36  Vitamin D, 25-Hydroxy Latest Ref Range: 30.0 - 100.0 ng/mL 63.6    I, Doreene Nest, am acting as Location manager for [employer]. Owens Shark, DO  I have reviewed the above documentation for accuracy and completeness, and I agree with the above. -Jearld Lesch, DO

## 2019-02-09 MED FILL — MONTELUKAST SOD 10 MG TAB: 10 | 90 days supply | Qty: 90 | Fill #2

## 2019-02-17 MED FILL — POTASSIUM CHL ER M10 TABLET: 10 | 30 days supply | Qty: 60 | Fill #5

## 2019-02-18 ENCOUNTER — Ambulatory Visit (INDEPENDENT_AMBULATORY_CARE_PROVIDER_SITE_OTHER): Payer: 59 | Admitting: Bariatrics

## 2019-02-18 ENCOUNTER — Encounter (INDEPENDENT_AMBULATORY_CARE_PROVIDER_SITE_OTHER): Payer: Self-pay | Admitting: Bariatrics

## 2019-02-18 ENCOUNTER — Other Ambulatory Visit: Payer: Self-pay

## 2019-02-18 VITALS — BP 112/76 | HR 79 | Temp 98.2°F | Ht 64.0 in | Wt 233.0 lb

## 2019-02-18 DIAGNOSIS — E559 Vitamin D deficiency, unspecified: Secondary | ICD-10-CM | POA: Diagnosis not present

## 2019-02-18 DIAGNOSIS — E8881 Metabolic syndrome: Secondary | ICD-10-CM

## 2019-02-18 DIAGNOSIS — E876 Hypokalemia: Secondary | ICD-10-CM | POA: Diagnosis not present

## 2019-02-18 DIAGNOSIS — Z6841 Body Mass Index (BMI) 40.0 and over, adult: Secondary | ICD-10-CM | POA: Diagnosis not present

## 2019-02-18 DIAGNOSIS — Z9189 Other specified personal risk factors, not elsewhere classified: Secondary | ICD-10-CM

## 2019-02-19 NOTE — Progress Notes (Signed)
Office: [phone]  /  Fax: [phone]   HPI:   Chief Complaint: OBESITY [name] is here to discuss her progress with her obesity treatment plan. She is on the Category 3 plan and is following her eating plan approximately 83 % of the time. She states she is walking 15 minutes 4 times per week. [name] is doing well. She is not struggling with food or appetite. Her weight is 233 lb (105.7 kg) today and has had a weight loss of 2 pounds since her last in-office visit. She has lost 17 lbs since starting treatment with [name].  Vitamin D deficiency [name] has a diagnosis of vitamin D deficiency. She is currently taking vit D and denies nausea, vomiting or muscle weakness.  At risk for osteopenia and osteoporosis [name] is at higher risk of osteopenia and osteoporosis due to vitamin D deficiency.   Low Potassium [name] has low potassium. Her last potassium level was at 3.4. She is currently taking Zestoretic.  Insulin Resistance [name] has a diagnosis of insulin resistance based on her elevated fasting insulin level >5. Although [name]'s blood glucose readings are still under good control, insulin resistance puts her at greater risk of metabolic syndrome and diabetes. Her last A1c was at 5.1 and last insulin level was at 16.1 [name] is not taking medications currently and she continues to work on diet and exercise to decrease risk of diabetes.  ASSESSMENT AND PLAN:  Vitamin D deficiency  Hypokalemia  Insulin resistance  At risk for osteoporosis  Class 3 severe obesity with serious comorbidity and body mass index (BMI) of 40.0 to 44.9 in adult, unspecified obesity type ([place])  PLAN:  Vitamin D Deficiency [name] was informed that low vitamin D levels contributes to fatigue and are associated with obesity, breast, and colon cancer. She agrees to stop prescription vitamin D and begin OTC vitamin D 2,000 IU daily and follow up for routine testing of vitamin D, at least 2-3 times per year. She was informed  of the risk of over-replacement of vitamin D and agrees to not increase her dose unless she discusses this with [name] first. [name] agrees to follow up as directed.  At risk for osteopenia and osteoporosis [name] was given extended  (15 minutes) osteoporosis prevention counseling today. [name] is at risk for osteopenia and osteoporosis due to her vitamin D deficiency. She was encouraged to take her vitamin D and follow her higher calcium diet and increase strengthening exercise to help strengthen her bones and decrease her risk of osteopenia and osteoporosis.  Low Potassium [name] will continue the potassium supplement and we will check potassium level periodically. She will follow up at the agreed upon time.  Insulin Resistance [name] will continue to work on weight loss, exercise, increasing lean protein and decreasing simple carbohydrates in her diet to help decrease the risk of diabetes. She was informed that eating too many simple carbohydrates or too many calories at one sitting increases the likelihood of GI side effects. [name] agreed to follow up with [name] as directed to monitor her progress.  Obesity [name] is currently in the action stage of change. As such, her goal is to continue with weight loss efforts She has agreed to keep a food journal with 450 to 600 calories and 40 grams of protein at supper daily and follow the Category 3 plan [name] has been instructed to work up to a goal of 150 minutes of combined cardio and strengthening exercise per week for weight loss and overall health  benefits. We discussed the following Behavioral Modification Strategies today: planning for success, increase H2O intake, no skipping meals, keeping healthy foods in the home, increasing lean protein intake, decreasing simple carbohydrates, increasing vegetables, decrease eating out and work on meal planning and easy cooking plans [name] will weigh her meat.  [name] has agreed to follow up with our clinic in 2 weeks. She was  informed of the importance of frequent follow up visits to maximize her success with intensive lifestyle modifications for her multiple health conditions.  ALLERGIES: Allergies  Allergen Reactions  . Codeine Other (See Comments)    "whole body turned bright red, swelled a little" with V codiene  . Lyrica [Pregabalin] Other (See Comments)    "suicidal thoughts"  . Nyquil Multi-Symptom [Pseudoeph-Doxylamine-Dm-Apap] Other (See Comments)    Turns skin red *all over body*, pt reports getting very hot   . Sulfa Antibiotics Other (See Comments)    "body turned red"    MEDICATIONS: Current Outpatient Medications on File Prior to Visit  Medication Sig Dispense Refill  . acetaZOLAMIDE (DIAMOX) 250 MG tablet Take 2 tablets (500 mg total) by mouth 2 (two) times daily. 360 tablet 4  . albuterol (PROVENTIL HFA;VENTOLIN HFA) 108 (90 Base) MCG/ACT inhaler Inhale 2 puffs into the lungs every 4 (four) hours as needed for wheezing or shortness of breath. 3 Inhaler 3  . Calcium Carbonate-Vitamin D (CALCIUM + D PO) Take 1 tablet by mouth daily. Calcium 600 mg, vit D3 300 mg    . cetirizine (ZYRTEC) 5 MG tablet Take 5 mg by mouth daily.    . Cyanocobalamin (VITAMIN B 12 PO) Take 5,000 mcg by mouth daily.     . Echinacea 125 MG CAPS Take 125 mg by mouth daily.     . Flax Oil-Fish Oil-Borage Oil (FISH OIL-FLAX OIL-BORAGE OIL) CAPS Take 2 capsules by mouth daily.     Marland Kitchen lisinopril-hydrochlorothiazide (PRINZIDE,ZESTORETIC) 20-12.5 MG tablet Take 1 tablet by mouth daily. 90 tablet 3  . Magnesium 250 MG TABS Take 250 mg by mouth daily.     . metroNIDAZOLE (METROCREAM) 0.75 % cream Apply topically 2 (two) times daily. 45 g 1  . montelukast (SINGULAIR) 10 MG tablet Take 1 tablet (10 mg total) by mouth at bedtime. 90 tablet 3  . Multiple Vitamin (MULTIVITAMIN) capsule Take 1 capsule by mouth daily.    . Oxymetazoline HCl (NASAL SPRAY NA) Place 2 sprays into the nose as needed (CONGESTION).     Marland Kitchen potassium chloride  (K-DUR,KLOR-CON) 10 MEQ tablet TAKE 1 TABLET BY MOUTH TWICE A DAY (NEED APPT FOR FOLLOW UP AND LAB WORK) 60 tablet 6  . Vitamin D, Ergocalciferol, (DRISDOL) 1.25 MG (50000 UT) CAPS capsule Take 1 capsule (50,000 Units total) by mouth every 7 (seven) days. 4 capsule 0   No current facility-administered medications on file prior to visit.     PAST MEDICAL HISTORY: Past Medical History:  Diagnosis Date  . Asthma   . Chiari I malformation ([place])   . Chiari malformation 1996   decompression   . High blood pressure   . Hypertension   . Idiopathic intracranial hypertension   . Lactose intolerance   . Pseudotumor cerebri   . Subclavian bypass stenosis ([place]) 11/97   right  . Swallowing difficulty     PAST SURGICAL HISTORY: Past Surgical History:  Procedure Laterality Date  . APPENDECTOMY    . chiari decompression    . GANGLION CYST EXCISION Right    wrist  . LAPAROSCOPIC  APPENDECTOMY N/A 10/28/2015   Procedure: APPENDECTOMY LAPAROSCOPIC;  Surgeon: Armandina Gemma, MD;  Location: [facility];  Service: General;  Laterality: N/A;  . SUBCLAVIAN BYPASS GRAFT Right 07/1996  . WRIST FRACTURE SURGERY Right 02/2007    SOCIAL HISTORY: Social History   Tobacco Use  . Smoking status: Never Smoker  . Smokeless tobacco: Never Used  Substance Use Topics  . Alcohol use: No    Alcohol/week: 0.0 standard drinks  . Drug use: No    FAMILY HISTORY: Family History  Problem Relation Age of Onset  . Cataracts Mother   . Diabetes Mother   . Hypertension Mother   . High Cholesterol Mother   . Obesity Mother   . Diabetes Father   . Hypertension Father   . Obesity Father     ROS: Review of Systems  Constitutional: Positive for weight loss.  Gastrointestinal: Negative for nausea and vomiting.  Musculoskeletal:       Negative for muscle weakness    PHYSICAL EXAM: Blood pressure 112/76, pulse 79, temperature 98.2 F (36.8 C), temperature source Oral, height 5\' 4"  (1.626 m), weight 233 lb (105.7  kg), SpO2 98 %. Body mass index is 39.99 kg/m. Physical Exam Vitals signs reviewed.  Constitutional:      Appearance: Normal appearance. She is well-developed. She is obese.  Cardiovascular:     Rate and Rhythm: Normal rate.  Pulmonary:     Effort: Pulmonary effort is normal.  Musculoskeletal: Normal range of motion.  Skin:    General: Skin is warm and dry.  Neurological:     Mental Status: She is alert and oriented to person, place, and time.  Psychiatric:        Mood and Affect: Mood normal.        Behavior: Behavior normal.     RECENT LABS AND TESTS: BMET    Component Value Date/Time   NA 138 01/29/2019 0836   K 3.4 (L) 01/29/2019 0836   CL 104 01/29/2019 0836   CO2 18 (L) 01/29/2019 0836   GLUCOSE 85 01/29/2019 0836   GLUCOSE 104 (H) 10/28/2015 2043   BUN 25 (H) 01/29/2019 0836   CREATININE 0.83 01/29/2019 0836   CALCIUM 9.1 01/29/2019 0836   GFRNONAA 85 01/29/2019 0836   GFRAA 98 01/29/2019 0836   Lab Results  Component Value Date   HGBA1C 5.1 01/29/2019   HGBA1C 5.2 09/08/2018   Lab Results  Component Value Date   INSULIN 16.1 01/29/2019   INSULIN 16.9 09/08/2018   CBC    Component Value Date/Time   WBC 7.2 07/28/2018 1302   WBC 15.0 (H) 10/28/2015 1931   RBC 4.59 07/28/2018 1302   RBC 4.37 10/28/2015 1931   HGB 14.2 07/28/2018 1302   HCT 42.3 07/28/2018 1302   PLT 243 07/28/2018 1302   MCV 92 07/28/2018 1302   MCH 30.9 07/28/2018 1302   MCH 30.2 10/28/2015 1931   MCHC 33.6 07/28/2018 1302   MCHC 34.0 10/28/2015 1931   RDW 12.0 (L) 07/28/2018 1302   LYMPHSABS 1.9 07/28/2018 1302   MONOABS 0.6 05/04/2015 0250   EOSABS 0.4 07/28/2018 1302   BASOSABS 0.0 07/28/2018 1302   Iron/TIBC/Ferritin/ %Sat No results found for: IRON, TIBC, FERRITIN, IRONPCTSAT Lipid Panel     Component Value Date/Time   CHOL 186 07/28/2018 1302   TRIG 124 07/28/2018 1302   HDL 51 07/28/2018 1302   CHOLHDL 3.6 07/28/2018 1302   LDLCALC 110 (H) 07/28/2018 1302    Hepatic Function Panel  Component Value Date/Time   PROT 6.6 01/29/2019 0836   ALBUMIN 4.3 01/29/2019 0836   AST 8 01/29/2019 0836   ALT 15 01/29/2019 0836   ALKPHOS 39 01/29/2019 0836   BILITOT 0.5 01/29/2019 0836      Component Value Date/Time   TSH 2.450 07/28/2018 1302   TSH 1.850 12/19/2016 1057   TSH 1.880 03/15/2015 0924     Ref. Range 01/29/2019 08:36  Vitamin D, 25-Hydroxy Latest Ref Range: 30.0 - 100.0 ng/mL 63.6    OBESITY BEHAVIORAL INTERVENTION VISIT  Today's visit was # 10   Starting weight: 250 lbs Starting date: 09/08/2018 Today's weight : 233 lbs Today's date: 02/18/2019 Total lbs lost to date: 17    02/18/2019  Height 5\' 4"  (1.626 m)  Weight 233 lb (105.7 kg)  BMI (Calculated) 39.97  BLOOD PRESSURE - SYSTOLIC 768  BLOOD PRESSURE - DIASTOLIC 76   Body Fat % 11.5 %  Total Body Water (lbs) 87.4 lbs    ASK: We discussed the diagnosis of obesity with [name] today and [name] agreed to give [name] permission to discuss obesity behavioral modification therapy today.  ASSESS: [name] has the diagnosis of obesity and her BMI today is 39.97 [name] is in the action stage of change   ADVISE: [name] was educated on the multiple health risks of obesity as well as the benefit of weight loss to improve her health. She was advised of the need for long term treatment and the importance of lifestyle modifications to improve her current health and to decrease her risk of future health problems.  AGREE: Multiple dietary modification options and treatment options were discussed and  [name] agreed to follow the recommendations documented in the above note.  ARRANGE: [name] was educated on the importance of frequent visits to treat obesity as outlined per CMS and USPSTF guidelines and agreed to schedule her next follow up appointment today.  Corey Skains, am acting as Location manager for [employer]. Owens Shark, DO  I have reviewed the above documentation for accuracy and  completeness, and I agree with the above. -Jearld Lesch, DO

## 2019-02-23 ENCOUNTER — Encounter (INDEPENDENT_AMBULATORY_CARE_PROVIDER_SITE_OTHER): Payer: Self-pay | Admitting: Bariatrics

## 2019-03-16 ENCOUNTER — Encounter (INDEPENDENT_AMBULATORY_CARE_PROVIDER_SITE_OTHER): Payer: Self-pay | Admitting: Bariatrics

## 2019-03-16 ENCOUNTER — Other Ambulatory Visit: Payer: Self-pay

## 2019-03-16 ENCOUNTER — Ambulatory Visit (INDEPENDENT_AMBULATORY_CARE_PROVIDER_SITE_OTHER): Payer: 59 | Admitting: Bariatrics

## 2019-03-16 VITALS — BP 104/69 | HR 83 | Temp 98.0°F | Ht 64.0 in | Wt 227.0 lb

## 2019-03-16 DIAGNOSIS — E559 Vitamin D deficiency, unspecified: Secondary | ICD-10-CM

## 2019-03-16 DIAGNOSIS — E8881 Metabolic syndrome: Secondary | ICD-10-CM

## 2019-03-16 DIAGNOSIS — Z6839 Body mass index (BMI) 39.0-39.9, adult: Secondary | ICD-10-CM

## 2019-03-16 NOTE — Progress Notes (Signed)
Office: [phone]  /  Fax: [phone]   HPI:   Chief Complaint: OBESITY [name] is here to discuss her progress with her obesity treatment plan. She is on the  follow the Category 3 plan and is following her eating plan approximately 90 % of the time. She states she is exercising by walking for 20 minutes 5 times per week. [name] is doing well overall and this visit. She is doing well with water. She states that it is harder to get protein.  Her weight is 227 lb (103 kg) today and has had a weight loss of 6 pounds over a period of 2 weeks since her last visit. She has lost 23 lbs since starting treatment with [name].  Vitamin D deficiency [name] has a diagnosis of vitamin D deficiency. She is currently taking over-the-counter vit D and denies nausea, vomiting or muscle weakness.  Insulin Resistance [name] has a diagnosis of insulin resistance based on her elevated fasting insulin level of 16.1 and HgA1c of 5.1. Although [name]'s blood glucose readings are still under good control, insulin resistance puts her at greater risk of metabolic syndrome and diabetes. She is not taking metformin currently and continues to work on diet and exercise to decrease risk of diabetes. She reports having a normal appetite.   ASSESSMENT AND PLAN:  Vitamin D deficiency  Insulin resistance  Class 2 severe obesity with serious comorbidity and body mass index (BMI) of 39.0 to 39.9 in adult, unspecified obesity type ([place])  PLAN: Vitamin D Deficiency [name] was informed that low vitamin D levels contributes to fatigue and are associated with obesity, breast, and colon cancer. She agrees to continue to take over-the-counter  Vit D and will follow up for routine testing of vitamin D, at least 2-3 times per year. She was informed of the risk of over-replacement of vitamin D and agrees to not increase her dose unless she discusses this with [name] first.  Vitamin D deficiency [name] has a diagnosis of vitamin D deficiency. She is  currently taking vit D and denies nausea, vomiting or muscle weakness.  I spent > than 50% of the 15 minute visit on counseling as documented in the note.  Obesity [name] is currently in the action stage of change. As such, her goal is to continue with weight loss efforts She has agreed to follow the Category 3 plan [name] has been instructed to work up to a goal of 150 minutes of combined cardio and strengthening exercise per week for weight loss and overall health benefits. We discussed the following Behavioral Modification Stratagies today: keeping healthy foods in the home, no skipping meals, increasing water intake,  increasing lean protein intake, decreasing simple carbohydrates , increasing vegetables, decrease eating out and work on meal planning and easy cooking plans   [name] has agreed to follow up with our clinic in 2 weeks. She was informed of the importance of frequent follow up visits to maximize her success with intensive lifestyle modifications for her multiple health conditions.  ALLERGIES: Allergies  Allergen Reactions  . Codeine Other (See Comments)    "whole body turned bright red, swelled a little" with V codiene  . Lyrica [Pregabalin] Other (See Comments)    "suicidal thoughts"  . Nyquil Multi-Symptom [Pseudoeph-Doxylamine-Dm-Apap] Other (See Comments)    Turns skin red *all over body*, pt reports getting very hot   . Sulfa Antibiotics Other (See Comments)    "body turned red"    MEDICATIONS: Current Outpatient Medications on File Prior  to Visit  Medication Sig Dispense Refill  . acetaZOLAMIDE (DIAMOX) 250 MG tablet Take 2 tablets (500 mg total) by mouth 2 (two) times daily. 360 tablet 4  . albuterol (PROVENTIL HFA;VENTOLIN HFA) 108 (90 Base) MCG/ACT inhaler Inhale 2 puffs into the lungs every 4 (four) hours as needed for wheezing or shortness of breath. 3 Inhaler 3  . Calcium Carbonate-Vitamin D (CALCIUM + D PO) Take 1 tablet by mouth daily. Calcium 600 mg, vit D3  300 mg    . cetirizine (ZYRTEC) 5 MG tablet Take 5 mg by mouth daily.    . Cyanocobalamin (VITAMIN B 12 PO) Take 5,000 mcg by mouth daily.     . Echinacea 125 MG CAPS Take 125 mg by mouth daily.     . Flax Oil-Fish Oil-Borage Oil (FISH OIL-FLAX OIL-BORAGE OIL) CAPS Take 2 capsules by mouth daily.     Marland Kitchen lisinopril-hydrochlorothiazide (PRINZIDE,ZESTORETIC) 20-12.5 MG tablet Take 1 tablet by mouth daily. 90 tablet 3  . Magnesium 250 MG TABS Take 250 mg by mouth daily.     . metroNIDAZOLE (METROCREAM) 0.75 % cream Apply topically 2 (two) times daily. 45 g 1  . montelukast (SINGULAIR) 10 MG tablet Take 1 tablet (10 mg total) by mouth at bedtime. 90 tablet 3  . Multiple Vitamin (MULTIVITAMIN) capsule Take 1 capsule by mouth daily.    . Oxymetazoline HCl (NASAL SPRAY NA) Place 2 sprays into the nose as needed (CONGESTION).     Marland Kitchen potassium chloride (K-DUR,KLOR-CON) 10 MEQ tablet TAKE 1 TABLET BY MOUTH TWICE A DAY (NEED APPT FOR FOLLOW UP AND LAB WORK) 60 tablet 6   No current facility-administered medications on file prior to visit.     PAST MEDICAL HISTORY: Past Medical History:  Diagnosis Date  . Asthma   . Chiari I malformation ([name])   . Chiari malformation 1996   decompression   . High blood pressure   . Hypertension   . Idiopathic intracranial hypertension   . Lactose intolerance   . Pseudotumor cerebri   . Subclavian bypass stenosis ([place]) 11/97   right  . Swallowing difficulty     PAST SURGICAL HISTORY: Past Surgical History:  Procedure Laterality Date  . APPENDECTOMY    . chiari decompression    . GANGLION CYST EXCISION Right    wrist  . LAPAROSCOPIC APPENDECTOMY N/A 10/28/2015   Procedure: APPENDECTOMY LAPAROSCOPIC;  Surgeon: Armandina Gemma, MD;  Location: [facility];  Service: General;  Laterality: N/A;  . SUBCLAVIAN BYPASS GRAFT Right 07/1996  . WRIST FRACTURE SURGERY Right 02/2007    SOCIAL HISTORY: Social History   Tobacco Use  . Smoking status: Never Smoker  . Smokeless  tobacco: Never Used  Substance Use Topics  . Alcohol use: No    Alcohol/week: 0.0 standard drinks  . Drug use: No    FAMILY HISTORY: Family History  Problem Relation Age of Onset  . Cataracts Mother   . Diabetes Mother   . Hypertension Mother   . High Cholesterol Mother   . Obesity Mother   . Diabetes Father   . Hypertension Father   . Obesity Father     ROS: Review of Systems  Constitutional: Positive for weight loss.  Gastrointestinal: Negative for nausea and vomiting.  Musculoskeletal:       Negative for muscle weakness    PHYSICAL EXAM: Blood pressure 104/69, pulse 83, temperature 98 F (36.7 C), temperature source Oral, height 5\' 4"  (1.626 m), weight 227 lb (103 kg), SpO2 97 %.  Body mass index is 38.96 kg/m. Physical Exam Vitals signs reviewed.  Constitutional:      Appearance: Normal appearance. She is obese.  HENT:     Head: Normocephalic.     Nose: Nose normal.  Cardiovascular:     Rate and Rhythm: Normal rate.  Pulmonary:     Effort: Pulmonary effort is normal.  Musculoskeletal: Normal range of motion.  Skin:    General: Skin is warm and dry.  Neurological:     Mental Status: She is alert and oriented to person, place, and time.  Psychiatric:        Mood and Affect: Mood normal.        Behavior: Behavior normal.     RECENT LABS AND TESTS: BMET    Component Value Date/Time   NA 138 01/29/2019 0836   K 3.4 (L) 01/29/2019 0836   CL 104 01/29/2019 0836   CO2 18 (L) 01/29/2019 0836   GLUCOSE 85 01/29/2019 0836   GLUCOSE 104 (H) 10/28/2015 2043   BUN 25 (H) 01/29/2019 0836   CREATININE 0.83 01/29/2019 0836   CALCIUM 9.1 01/29/2019 0836   GFRNONAA 85 01/29/2019 0836   GFRAA 98 01/29/2019 0836   Lab Results  Component Value Date   HGBA1C 5.1 01/29/2019   HGBA1C 5.2 09/08/2018   Lab Results  Component Value Date   INSULIN 16.1 01/29/2019   INSULIN 16.9 09/08/2018   CBC    Component Value Date/Time   WBC 7.2 07/28/2018 1302   WBC  15.0 (H) 10/28/2015 1931   RBC 4.59 07/28/2018 1302   RBC 4.37 10/28/2015 1931   HGB 14.2 07/28/2018 1302   HCT 42.3 07/28/2018 1302   PLT 243 07/28/2018 1302   MCV 92 07/28/2018 1302   MCH 30.9 07/28/2018 1302   MCH 30.2 10/28/2015 1931   MCHC 33.6 07/28/2018 1302   MCHC 34.0 10/28/2015 1931   RDW 12.0 (L) 07/28/2018 1302   LYMPHSABS 1.9 07/28/2018 1302   MONOABS 0.6 05/04/2015 0250   EOSABS 0.4 07/28/2018 1302   BASOSABS 0.0 07/28/2018 1302   Iron/TIBC/Ferritin/ %Sat No results found for: IRON, TIBC, FERRITIN, IRONPCTSAT Lipid Panel     Component Value Date/Time   CHOL 186 07/28/2018 1302   TRIG 124 07/28/2018 1302   HDL 51 07/28/2018 1302   CHOLHDL 3.6 07/28/2018 1302   LDLCALC 110 (H) 07/28/2018 1302   Hepatic Function Panel     Component Value Date/Time   PROT 6.6 01/29/2019 0836   ALBUMIN 4.3 01/29/2019 0836   AST 8 01/29/2019 0836   ALT 15 01/29/2019 0836   ALKPHOS 39 01/29/2019 0836   BILITOT 0.5 01/29/2019 0836      Component Value Date/Time   TSH 2.450 07/28/2018 1302   TSH 1.850 12/19/2016 1057   TSH 1.880 03/15/2015 0924     Ref. Range 09/08/2018 00:00  Vitamin D, 25-Hydroxy Latest Ref Range: 30.0 - 100.0 ng/mL 31.3      OBESITY BEHAVIORAL INTERVENTION VISIT  Today's visit was # 11   Starting weight: 250 lbs Starting date: 09/08/18 Today's weight : Weight: 227 lb (103 kg)  Today's date: 03/16/2019 Total lbs lost to date: 23 lbs    ASK: We discussed the diagnosis of obesity with [name] today and Cecille Rubin agreed to give [name] permission to discuss obesity behavioral modification therapy today.  ASSESS: Porshe has the diagnosis of obesity and her BMI today is 38.95 Khaleah is in the action stage of change   ADVISE: Collie was  educated on the multiple health risks of obesity as well as the benefit of weight loss to improve her health. She was advised of the need for long term treatment and the importance of lifestyle modifications to improve her  current health and to decrease her risk of future health problems.  AGREE: Multiple dietary modification options and treatment options were discussed and  Kieryn agreed to follow the recommendations documented in the above note.  ARRANGE: Tashea was educated on the importance of frequent visits to treat obesity as outlined per CMS and USPSTF guidelines and agreed to schedule her next follow up appointment today.  Leary Roca, am acting as transcriptionist for [employer], DO   I have reviewed the above documentation for accuracy and completeness, and I agree with the above. -Jearld Lesch, DO

## 2019-03-23 MED FILL — POTASSIUM CHL ER M10 TABLET: 10 | 30 days supply | Qty: 60 | Fill #6

## 2019-03-30 ENCOUNTER — Ambulatory Visit (INDEPENDENT_AMBULATORY_CARE_PROVIDER_SITE_OTHER): Payer: 59 | Admitting: Physician Assistant

## 2019-03-31 ENCOUNTER — Ambulatory Visit (INDEPENDENT_AMBULATORY_CARE_PROVIDER_SITE_OTHER): Payer: 59 | Admitting: Family Medicine

## 2019-03-31 ENCOUNTER — Other Ambulatory Visit: Payer: Self-pay

## 2019-03-31 VITALS — BP 119/75 | HR 86 | Temp 98.0°F | Ht 64.0 in | Wt 228.0 lb

## 2019-03-31 DIAGNOSIS — E559 Vitamin D deficiency, unspecified: Secondary | ICD-10-CM

## 2019-03-31 DIAGNOSIS — I10 Essential (primary) hypertension: Secondary | ICD-10-CM | POA: Diagnosis not present

## 2019-03-31 DIAGNOSIS — Z6839 Body mass index (BMI) 39.0-39.9, adult: Secondary | ICD-10-CM | POA: Diagnosis not present

## 2019-03-31 NOTE — Progress Notes (Signed)
Office: [phone]  /  Fax: [phone]   HPI:   Chief Complaint: OBESITY [name] is here to discuss her progress with her obesity treatment plan. She is on the Category 3 plan and is following her eating plan approximately 87 % of the time. She states she is walking for 30 minutes 4 times per week. [name] returned for an in office appointment. She has had a couple of nights where she has struggled to eat all of her snacks at work. She denies hunger. She is getting most protein in throughout the day.  Her weight is 228 lb (103.4 kg) today and has gained 1 lb since her last visit. She has lost 22 lbs since starting treatment with [name].  Vitamin D Deficiency [name] has a diagnosis of vitamin D deficiency. She is currently taking OTC Vit D. She notes fatigue and denies nausea, vomiting or muscle weakness.  Hypertension [name] is a 48 y.o. female with hypertension. [name] blood pressure is controlled. She denies chest pain, chest pressure, or headaches. She is working on weight loss to help control her blood pressure with the goal of decreasing her risk of heart attack and stroke.   ASSESSMENT AND PLAN:  Vitamin D deficiency  Essential hypertension  Class 2 severe obesity with serious comorbidity and body mass index (BMI) of 39.0 to 39.9 in adult, unspecified obesity type ([name])  PLAN:  Vitamin D Deficiency [name] was informed that low vitamin D levels contributes to fatigue and are associated with obesity, breast, and colon cancer. [name] agrees to continue taking OTC Vit D and will follow up for routine testing of vitamin D, at least 2-3 times per year. She was informed of the risk of over-replacement of vitamin D and agrees to not increase her dose unless she discusses this with [name] first. [name] agrees to follow up with our clinic in 2 weeks.  Hypertension We discussed sodium restriction, working on healthy weight loss, and a regular exercise program as the means to achieve improved  blood pressure control. [name] agreed with this plan and agreed to follow up as directed. We will continue to monitor her blood pressure as well as her progress with the above lifestyle modifications. [name] agrees continue her current medications, no change in dose. She will watch for signs of hypotension as she continues her lifestyle modifications. [name] agrees to follow up with our clinic in 2 weeks.  I spent > than 50% of the 15 minute visit on counseling as documented in the note.  Obesity [name] is currently in the action stage of change. As such, her goal is to continue with weight loss efforts She has agreed to follow the Category 3 plan [name] has been instructed to work up to a goal of 150 minutes of combined cardio and strengthening exercise per week or add in resistance training for 5-10 minutes 2-3 times per week for weight loss and overall health benefits. We discussed the following Behavioral Modification Strategies today: increasing lean protein intake, increasing vegetables and work on meal planning and easy cooking plans, keeping healthy foods in the home, and planning for success   [name] has agreed to follow up with our clinic in 2 weeks. She was informed of the importance of frequent follow up visits to maximize her success with intensive lifestyle modifications for her multiple health conditions.  ALLERGIES: Allergies  Allergen Reactions  . Codeine Other (See Comments)    "whole body turned bright red, swelled a little" with V codiene  .  Lyrica [Pregabalin] Other (See Comments)    "suicidal thoughts"  . Nyquil Multi-Symptom [Pseudoeph-Doxylamine-Dm-Apap] Other (See Comments)    Turns skin red *all over body*, pt reports getting very hot   . Sulfa Antibiotics Other (See Comments)    "body turned red"    MEDICATIONS: Current Outpatient Medications on File Prior to Visit  Medication Sig Dispense Refill  . acetaZOLAMIDE (DIAMOX) 250 MG tablet Take 2 tablets (500 mg total) by  mouth 2 (two) times daily. 360 tablet 4  . albuterol (PROVENTIL HFA;VENTOLIN HFA) 108 (90 Base) MCG/ACT inhaler Inhale 2 puffs into the lungs every 4 (four) hours as needed for wheezing or shortness of breath. 3 Inhaler 3  . Calcium Carbonate-Vitamin D (CALCIUM + D PO) Take 1 tablet by mouth daily. Calcium 600 mg, vit D3 300 mg    . cetirizine (ZYRTEC) 5 MG tablet Take 5 mg by mouth daily.    . Cyanocobalamin (VITAMIN B 12 PO) Take 5,000 mcg by mouth daily.     . Echinacea 125 MG CAPS Take 125 mg by mouth daily.     . Flax Oil-Fish Oil-Borage Oil (FISH OIL-FLAX OIL-BORAGE OIL) CAPS Take 2 capsules by mouth daily.     Marland Kitchen lisinopril-hydrochlorothiazide (PRINZIDE,ZESTORETIC) 20-12.5 MG tablet Take 1 tablet by mouth daily. 90 tablet 3  . Magnesium 250 MG TABS Take 250 mg by mouth daily.     . metroNIDAZOLE (METROCREAM) 0.75 % cream Apply topically 2 (two) times daily. 45 g 1  . montelukast (SINGULAIR) 10 MG tablet Take 1 tablet (10 mg total) by mouth at bedtime. 90 tablet 3  . Multiple Vitamin (MULTIVITAMIN) capsule Take 1 capsule by mouth daily.    . Oxymetazoline HCl (NASAL SPRAY NA) Place 2 sprays into the nose as needed (CONGESTION).     Marland Kitchen potassium chloride (K-DUR,KLOR-CON) 10 MEQ tablet TAKE 1 TABLET BY MOUTH TWICE A DAY (NEED APPT FOR FOLLOW UP AND LAB WORK) 60 tablet 6   No current facility-administered medications on file prior to visit.     PAST MEDICAL HISTORY: Past Medical History:  Diagnosis Date  . Asthma   . Chiari I malformation ([place])   . Chiari malformation 1996   decompression   . High blood pressure   . Hypertension   . Idiopathic intracranial hypertension   . Lactose intolerance   . Pseudotumor cerebri   . Subclavian bypass stenosis ([place]) 11/97   right  . Swallowing difficulty     PAST SURGICAL HISTORY: Past Surgical History:  Procedure Laterality Date  . APPENDECTOMY    . chiari decompression    . GANGLION CYST EXCISION Right    wrist  . LAPAROSCOPIC  APPENDECTOMY N/A 10/28/2015   Procedure: APPENDECTOMY LAPAROSCOPIC;  Surgeon: Armandina Gemma, MD;  Location: [facility];  Service: General;  Laterality: N/A;  . SUBCLAVIAN BYPASS GRAFT Right 07/1996  . WRIST FRACTURE SURGERY Right 02/2007    SOCIAL HISTORY: Social History   Tobacco Use  . Smoking status: Never Smoker  . Smokeless tobacco: Never Used  Substance Use Topics  . Alcohol use: No    Alcohol/week: 0.0 standard drinks  . Drug use: No    FAMILY HISTORY: Family History  Problem Relation Age of Onset  . Cataracts Mother   . Diabetes Mother   . Hypertension Mother   . High Cholesterol Mother   . Obesity Mother   . Diabetes Father   . Hypertension Father   . Obesity Father     ROS: Review of Systems  Constitutional: Positive for malaise/fatigue. Negative for weight loss.  Cardiovascular: Negative for chest pain.       Negative chest pressure  Gastrointestinal: Negative for nausea and vomiting.  Musculoskeletal:       Negative muscle weakness  Neurological: Negative for headaches.    PHYSICAL EXAM: Blood pressure 119/75, pulse 86, temperature 98 F (36.7 C), temperature source Oral, height 5\' 4"  (1.626 m), weight 228 lb (103.4 kg), SpO2 98 %. Body mass index is 39.14 kg/m. Physical Exam Vitals signs reviewed.  Constitutional:      Appearance: Normal appearance. She is obese.  Cardiovascular:     Rate and Rhythm: Normal rate.     Pulses: Normal pulses.  Pulmonary:     Effort: Pulmonary effort is normal.     Breath sounds: Normal breath sounds.  Musculoskeletal: Normal range of motion.  Skin:    General: Skin is warm and dry.  Neurological:     Mental Status: She is alert and oriented to person, place, and time.  Psychiatric:        Mood and Affect: Mood normal.        Behavior: Behavior normal.     RECENT LABS AND TESTS: BMET    Component Value Date/Time   NA 138 01/29/2019 0836   K 3.4 (L) 01/29/2019 0836   CL 104 01/29/2019 0836   CO2 18 (L)  01/29/2019 0836   GLUCOSE 85 01/29/2019 0836   GLUCOSE 104 (H) 10/28/2015 2043   BUN 25 (H) 01/29/2019 0836   CREATININE 0.83 01/29/2019 0836   CALCIUM 9.1 01/29/2019 0836   GFRNONAA 85 01/29/2019 0836   GFRAA 98 01/29/2019 0836   Lab Results  Component Value Date   HGBA1C 5.1 01/29/2019   HGBA1C 5.2 09/08/2018   Lab Results  Component Value Date   INSULIN 16.1 01/29/2019   INSULIN 16.9 09/08/2018   CBC    Component Value Date/Time   WBC 7.2 07/28/2018 1302   WBC 15.0 (H) 10/28/2015 1931   RBC 4.59 07/28/2018 1302   RBC 4.37 10/28/2015 1931   HGB 14.2 07/28/2018 1302   HCT 42.3 07/28/2018 1302   PLT 243 07/28/2018 1302   MCV 92 07/28/2018 1302   MCH 30.9 07/28/2018 1302   MCH 30.2 10/28/2015 1931   MCHC 33.6 07/28/2018 1302   MCHC 34.0 10/28/2015 1931   RDW 12.0 (L) 07/28/2018 1302   LYMPHSABS 1.9 07/28/2018 1302   MONOABS 0.6 05/04/2015 0250   EOSABS 0.4 07/28/2018 1302   BASOSABS 0.0 07/28/2018 1302   Iron/TIBC/Ferritin/ %Sat No results found for: IRON, TIBC, FERRITIN, IRONPCTSAT Lipid Panel     Component Value Date/Time   CHOL 186 07/28/2018 1302   TRIG 124 07/28/2018 1302   HDL 51 07/28/2018 1302   CHOLHDL 3.6 07/28/2018 1302   LDLCALC 110 (H) 07/28/2018 1302   Hepatic Function Panel     Component Value Date/Time   PROT 6.6 01/29/2019 0836   ALBUMIN 4.3 01/29/2019 0836   AST 8 01/29/2019 0836   ALT 15 01/29/2019 0836   ALKPHOS 39 01/29/2019 0836   BILITOT 0.5 01/29/2019 0836      Component Value Date/Time   TSH 2.450 07/28/2018 1302   TSH 1.850 12/19/2016 1057   TSH 1.880 03/15/2015 0924      OBESITY BEHAVIORAL INTERVENTION VISIT  Today's visit was # 12   Starting weight: 250 lbs Starting date: 09/08/2018 Today's weight : 228 lbs Today's date: 03/31/2019 Total lbs lost to date: 48  ASK: We discussed the diagnosis of obesity with [name] today and [name] agreed to give [name] permission to discuss obesity behavioral modification  therapy today.  ASSESS: [name] has the diagnosis of obesity and her BMI today is 39.12 [name] is in the action stage of change   ADVISE: [name] was educated on the multiple health risks of obesity as well as the benefit of weight loss to improve her health. She was advised of the need for long term treatment and the importance of lifestyle modifications to improve her current health and to decrease her risk of future health problems.  AGREE: Multiple dietary modification options and treatment options were discussed and  [name] agreed to follow the recommendations documented in the above note.  ARRANGE: [name] was educated on the importance of frequent visits to treat obesity as outlined per CMS and USPSTF guidelines and agreed to schedule her next follow up appointment today.  I, [name], am acting as transcriptionist for Kylie Qua, MD  I have reviewed the above documentation for accuracy and completeness, and I agree with the above. - Kylie Qua, MD

## 2019-04-08 ENCOUNTER — Encounter: Payer: Self-pay | Admitting: Family Medicine

## 2019-04-08 ENCOUNTER — Other Ambulatory Visit: Payer: Self-pay

## 2019-04-08 ENCOUNTER — Ambulatory Visit: Payer: 59 | Admitting: Family Medicine

## 2019-04-08 ENCOUNTER — Ambulatory Visit: Payer: 59 | Admitting: Adult Health

## 2019-04-08 VITALS — BP 125/78 | HR 83 | Temp 97.7°F | Ht 65.0 in | Wt 235.8 lb

## 2019-04-08 DIAGNOSIS — G932 Benign intracranial hypertension: Secondary | ICD-10-CM

## 2019-04-08 MED ORDER — ACETAZOLAMIDE 250 MG PO TABS: 500.0000 mg | ORAL_TABLET | Freq: Two times a day (BID) | ORAL | 3 refills | Status: DC
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
## 2019-04-08 NOTE — Patient Instructions (Signed)
Continue acetazolamide 500mg  twice daily  Congrats on the weight loss!!! Keep up the good work.   Follow up with PCP annually.   Follow up with me annually.     Idiopathic Intracranial Hypertension  Idiopathic intracranial hypertension (IIH) is a condition that increases pressure around the brain. The fluid that surrounds the brain and spinal cord (cerebrospinal fluid, CSF) increases and causes the pressure. Idiopathic means that the cause of this condition is not known. IIH affects the brain and spinal cord (is a neurological disorder). If this condition is not treated, it can cause vision loss or blindness. What increases the risk? You are more likely to develop this condition if:  You are severely overweight (obese).  You are a woman who has not gone through menopause.  You take certain medicines, such as birth control or steroids. What are the signs or symptoms? Symptoms of IIH include:  Headaches. This is the most common symptom.  Pain in the shoulders or neck.  Nausea and vomiting.  A "rushing water" or pulsing sound within the ears (pulsatile tinnitus).  Double vision.  Blurred vision.  Brief episodes of complete vision loss. How is this diagnosed? This condition may be diagnosed based on:  Your symptoms.  Your medical history.  CT scan of the brain.  MRI of the brain.  Magnetic resonance venogram (MRV) to check veins in the brain.  Diagnostic lumbar puncture. This is a procedure to remove and examine a sample of cerebrospinal fluid. This procedure can determine whether too much fluid may be causing IIH.  A thorough eye exam to check for swelling or nerve damage in the eyes. How is this treated? Treatment for this condition depends on your symptoms. The goal of treatment is to decrease the pressure around your brain. Common treatments include:  Medicines to decrease the production of spinal fluid and lower the pressure within your skull.  Medicines to  prevent or treat headaches.  Surgery to place drains (shunts) in your brain to remove excess fluid.  Lumbar puncture to remove excess cerebrospinal fluid. Follow these instructions at home:  If you are overweight or obese, work with your health care provider to lose weight.  Take over-the-counter and prescription medicines only as told by your health care provider.  Do not drive or use heavy machinery while taking medicines that can make you sleepy.  Keep all follow-up visits as told by your health care provider. This is important. Contact a health care provider if:  You have changes in your vision, such as: ? Double vision. ? Not being able to see colors (color vision). Get help right away if:  You have any of the following symptoms and they get worse or do not get better. ? Headaches. ? Nausea. ? Vomiting. ? Vision changes or difficulty seeing. Summary  Idiopathic intracranial hypertension (IIH) is a condition that increases pressure around the brain. The cause is not known (is idiopathic).  The most common symptom of IIH is headaches.  Treatment may include medicines or surgery to relieve the pressure on your brain. This information is not intended to replace advice given to you by your health care provider. Make sure you discuss any questions you have with your health care provider. Document Released: 10/29/2001 Document Revised: 08/02/2017 Document Reviewed: 07/11/2016 Elsevier Patient Education  2020 [employer].

## 2019-04-19 MED FILL — LISINOPRIL-HCTZ 20-12.5 MG: 20-12.5 | 90 days supply | Qty: 90 | Fill #2

## 2019-04-20 ENCOUNTER — Other Ambulatory Visit: Payer: Self-pay

## 2019-04-20 ENCOUNTER — Ambulatory Visit (INDEPENDENT_AMBULATORY_CARE_PROVIDER_SITE_OTHER): Payer: 59 | Admitting: Family Medicine

## 2019-04-20 ENCOUNTER — Encounter (INDEPENDENT_AMBULATORY_CARE_PROVIDER_SITE_OTHER): Payer: Self-pay | Admitting: Family Medicine

## 2019-04-20 VITALS — BP 126/75 | HR 89 | Temp 98.4°F | Ht 64.0 in | Wt 224.0 lb

## 2019-04-20 DIAGNOSIS — Z6838 Body mass index (BMI) 38.0-38.9, adult: Secondary | ICD-10-CM

## 2019-04-20 DIAGNOSIS — I10 Essential (primary) hypertension: Secondary | ICD-10-CM | POA: Diagnosis not present

## 2019-04-20 DIAGNOSIS — E876 Hypokalemia: Secondary | ICD-10-CM

## 2019-04-22 NOTE — Progress Notes (Signed)
I reviewed note and agree with plan.   Penni Bombard, MD 0/51/8335, 8:25 PM Certified in Neurology, Neurophysiology and Neuroimaging  [facility] [address] [place], [place] [address] [phone]

## 2019-04-23 MED FILL — acetaZOLAMIDE 250 MG TABS: 250 | 90 days supply | Qty: 360 | Fill #1

## 2019-04-27 NOTE — Progress Notes (Signed)
Office: [phone]  /  Fax: [phone]   HPI:   Chief Complaint: OBESITY [name] is here to discuss her progress with her obesity treatment plan. She is on the Category 3 plan and is following her eating plan approximately 85 % of the time. She states she is walking and exercising with stretch bands for a total of 10 hours 3 times this week. 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 She is going away for a week to the mountains early September. She voices it is less helpful with meal plan. Her vacation may pose challenge to follow the plan.  Her weight is 224 lb (101.6 kg) today and has had a weight loss of 4 pounds over a period of 3 weeks since her last visit. She has lost 26 lbs since starting treatment with [name].  Hypertension [name] is a 48 y.o. female with hypertension. [name]'s blood pressure is controlled today. She denies chest pain, chest pressure, or headaches. She is working on weight loss to help control her blood pressure with the goal of decreasing her risk of heart attack and stroke.   Hypokalemia [name] is on supplementations potassium, and is managed by her primary care physician.  ASSESSMENT AND PLAN:  Essential hypertension  Hypokalemia  Class 2 severe obesity with serious comorbidity and body mass index (BMI) of 38.0 to 38.9 in adult, unspecified obesity type ([place])  PLAN:  Hypertension We discussed sodium restriction, working on healthy weight loss, and a regular exercise program as the means to achieve improved blood pressure control. [name] agreed with this plan and agreed to follow up as directed. We will continue to monitor her blood pressure as well as her progress with the above lifestyle modifications. [name] agrees to continue her current medications and will watch for signs of hypotension as she continues her lifestyle modifications. [name] agrees  to follow up with our clinic in 2 to 3 weeks.  Hypokalemia [name] will follow up with her primary care physician about labs and potassium supplement.  I spent > than 50% of the 15 minute visit on counseling as documented in the note.  Obesity [name] is currently in the action stage of change. As such, her goal is to continue with weight loss efforts She has agreed to follow the Category 3 plan [name] has been instructed to work up to a goal of 150 minutes of combined cardio and strengthening exercise per week for weight loss and overall health benefits. We discussed the following Behavioral Modification Strategies today: increasing lean protein intake, increasing vegetables and work on meal planning and easy cooking plans, keeping healthy foods in the home, better snacking choices, and planning for success   [name] has agreed to follow up with our clinic in 2 to 3 weeks. She was informed of the importance of frequent follow up visits to maximize her success with intensive lifestyle modifications for her multiple health conditions.  ALLERGIES: Allergies  Allergen Reactions  . Codeine Other (See Comments)    "whole body turned bright red, swelled a little" with V codiene  . Lyrica [Pregabalin] Other (See Comments)    "suicidal thoughts"  . Nyquil Multi-Symptom [Pseudoeph-Doxylamine-Dm-Apap] Other (See Comments)    Turns skin red *all over body*, pt reports getting very hot   . Sulfa Antibiotics Other (See Comments)    "body turned red"    MEDICATIONS: Current Outpatient Medications  on File Prior to Visit  Medication Sig Dispense Refill  . acetaZOLAMIDE (DIAMOX) 250 MG tablet Take 2 tablets (500 mg total) by mouth 2 (two) times daily. 360 tablet 3  . albuterol (PROVENTIL HFA;VENTOLIN HFA) 108 (90 Base) MCG/ACT inhaler Inhale 2 puffs into the lungs every 4 (four) hours as needed for wheezing or shortness of breath. 3 Inhaler 3  . Calcium Carbonate-Vitamin D (CALCIUM + D PO) Take 1 tablet by  mouth daily. Calcium 600 mg, vit D3 300 mg    . cetirizine (ZYRTEC) 5 MG tablet Take 5 mg by mouth daily.    . Cyanocobalamin (VITAMIN B 12 PO) Take 5,000 mcg by mouth daily.     . Echinacea 125 MG CAPS Take 125 mg by mouth daily.     Marland Kitchen lisinopril-hydrochlorothiazide (PRINZIDE,ZESTORETIC) 20-12.5 MG tablet Take 1 tablet by mouth daily. 90 tablet 3  . Magnesium 250 MG TABS Take 250 mg by mouth daily.     [name] Spring Omega-3 Krill Oil 500 MG CAPS Take 1 capsule by mouth daily.    . metroNIDAZOLE (METROCREAM) 0.75 % cream Apply topically 2 (two) times daily. 45 g 1  . montelukast (SINGULAIR) 10 MG tablet Take 1 tablet (10 mg total) by mouth at bedtime. 90 tablet 3  . Multiple Vitamin (MULTIVITAMIN) capsule Take 1 capsule by mouth daily.    . Oxymetazoline HCl (NASAL SPRAY NA) Place 2 sprays into the nose as needed (CONGESTION).     Marland Kitchen potassium chloride (K-DUR,KLOR-CON) 10 MEQ tablet TAKE 1 TABLET BY MOUTH TWICE A DAY (NEED APPT FOR FOLLOW UP AND LAB WORK) 60 tablet 6   No current facility-administered medications on file prior to visit.     PAST MEDICAL HISTORY: Past Medical History:  Diagnosis Date  . Asthma   . Chiari I malformation ([place])   . Chiari malformation 1996   decompression   . High blood pressure   . Hypertension   . Idiopathic intracranial hypertension   . Lactose intolerance   . Pseudotumor cerebri   . Subclavian bypass stenosis ([place]) 11/97   right  . Swallowing difficulty     PAST SURGICAL HISTORY: Past Surgical History:  Procedure Laterality Date  . APPENDECTOMY    . chiari decompression    . GANGLION CYST EXCISION Right    wrist  . LAPAROSCOPIC APPENDECTOMY N/A 10/28/2015   Procedure: APPENDECTOMY LAPAROSCOPIC;  Surgeon: Armandina Gemma, MD;  Location: [facility];  Service: General;  Laterality: N/A;  . SUBCLAVIAN BYPASS GRAFT Right 07/1996  . WRIST FRACTURE SURGERY Right 02/2007    SOCIAL HISTORY: Social History   Tobacco Use  . Smoking status: Never Smoker  .  Smokeless tobacco: Never Used  Substance Use Topics  . Alcohol use: No    Alcohol/week: 0.0 standard drinks  . Drug use: No    FAMILY HISTORY: Family History  Problem Relation Age of Onset  . Cataracts Mother   . Diabetes Mother   . Hypertension Mother   . High Cholesterol Mother   . Obesity Mother   . Diabetes Father   . Hypertension Father   . Obesity Father     ROS: Review of Systems  Constitutional: Positive for weight loss.  Cardiovascular: Negative for chest pain.       Negative chest pressure  Neurological: Negative for headaches.    PHYSICAL EXAM: Blood pressure 126/75, pulse 89, temperature 98.4 F (36.9 C), temperature source Oral, height 5\' 4"  (1.626 m), weight 224 lb (101.6 kg), SpO2 99 %.  Body mass index is 38.45 kg/m. Physical Exam Vitals signs reviewed.  Constitutional:      Appearance: Normal appearance. She is obese.  Cardiovascular:     Rate and Rhythm: Normal rate.     Pulses: Normal pulses.  Pulmonary:     Effort: Pulmonary effort is normal.     Breath sounds: Normal breath sounds.  Musculoskeletal: Normal range of motion.  Skin:    General: Skin is warm and dry.  Neurological:     Mental Status: She is alert and oriented to person, place, and time.  Psychiatric:        Mood and Affect: Mood normal.        Behavior: Behavior normal.     RECENT LABS AND TESTS: BMET    Component Value Date/Time   NA 138 01/29/2019 0836   K 3.4 (L) 01/29/2019 0836   CL 104 01/29/2019 0836   CO2 18 (L) 01/29/2019 0836   GLUCOSE 85 01/29/2019 0836   GLUCOSE 104 (H) 10/28/2015 2043   BUN 25 (H) 01/29/2019 0836   CREATININE 0.83 01/29/2019 0836   CALCIUM 9.1 01/29/2019 0836   GFRNONAA 85 01/29/2019 0836   GFRAA 98 01/29/2019 0836   Lab Results  Component Value Date   HGBA1C 5.1 01/29/2019   HGBA1C 5.2 09/08/2018   Lab Results  Component Value Date   INSULIN 16.1 01/29/2019   INSULIN 16.9 09/08/2018   CBC    Component Value Date/Time   WBC  7.2 07/28/2018 1302   WBC 15.0 (H) 10/28/2015 1931   RBC 4.59 07/28/2018 1302   RBC 4.37 10/28/2015 1931   HGB 14.2 07/28/2018 1302   HCT 42.3 07/28/2018 1302   PLT 243 07/28/2018 1302   MCV 92 07/28/2018 1302   MCH 30.9 07/28/2018 1302   MCH 30.2 10/28/2015 1931   MCHC 33.6 07/28/2018 1302   MCHC 34.0 10/28/2015 1931   RDW 12.0 (L) 07/28/2018 1302   LYMPHSABS 1.9 07/28/2018 1302   MONOABS 0.6 05/04/2015 0250   EOSABS 0.4 07/28/2018 1302   BASOSABS 0.0 07/28/2018 1302   Iron/TIBC/Ferritin/ %Sat No results found for: IRON, TIBC, FERRITIN, IRONPCTSAT Lipid Panel     Component Value Date/Time   CHOL 186 07/28/2018 1302   TRIG 124 07/28/2018 1302   HDL 51 07/28/2018 1302   CHOLHDL 3.6 07/28/2018 1302   LDLCALC 110 (H) 07/28/2018 1302   Hepatic Function Panel     Component Value Date/Time   PROT 6.6 01/29/2019 0836   ALBUMIN 4.3 01/29/2019 0836   AST 8 01/29/2019 0836   ALT 15 01/29/2019 0836   ALKPHOS 39 01/29/2019 0836   BILITOT 0.5 01/29/2019 0836      Component Value Date/Time   TSH 2.450 07/28/2018 1302   TSH 1.850 12/19/2016 1057   TSH 1.880 03/15/2015 0924      OBESITY BEHAVIORAL INTERVENTION VISIT  Today's visit was # 13   Starting weight: 250 lbs Starting date: 09/08/2018 Today's weight : 224 lbs Today's date: 04/20/2019 Total lbs lost to date: 16    ASK: We discussed the diagnosis of obesity with [name] today and Cecille Rubin agreed to give [name] permission to discuss obesity behavioral modification therapy today.  ASSESS: Kieleigh has the diagnosis of obesity and her BMI today is 38.43 Jasneet is in the action stage of change   ADVISE: Jahida was educated on the multiple health risks of obesity as well as the benefit of weight loss to improve her health. She was advised of  the need for long term treatment and the importance of lifestyle modifications to improve her current health and to decrease her risk of future health problems.  AGREE: Multiple  dietary modification options and treatment options were discussed and  Miho agreed to follow the recommendations documented in the above note.  ARRANGE: Gerlean was educated on the importance of frequent visits to treat obesity as outlined per CMS and USPSTF guidelines and agreed to schedule her next follow up appointment today.  I, Trixie Dredge, am acting as transcriptionist for Ilene Qua, MD  I have reviewed the above documentation for accuracy and completeness, and I agree with the above. - Ilene Qua, MD

## 2019-04-30 ENCOUNTER — Encounter (INDEPENDENT_AMBULATORY_CARE_PROVIDER_SITE_OTHER): Payer: Self-pay

## 2019-05-04 ENCOUNTER — Encounter (INDEPENDENT_AMBULATORY_CARE_PROVIDER_SITE_OTHER): Payer: Self-pay | Admitting: Family Medicine

## 2019-05-04 ENCOUNTER — Ambulatory Visit (INDEPENDENT_AMBULATORY_CARE_PROVIDER_SITE_OTHER): Payer: 59 | Admitting: Family Medicine

## 2019-05-04 ENCOUNTER — Other Ambulatory Visit: Payer: Self-pay

## 2019-05-04 VITALS — BP 110/76 | HR 88 | Temp 97.9°F | Ht 64.0 in | Wt 224.0 lb

## 2019-05-04 DIAGNOSIS — E7849 Other hyperlipidemia: Secondary | ICD-10-CM | POA: Diagnosis not present

## 2019-05-04 DIAGNOSIS — E8881 Metabolic syndrome: Secondary | ICD-10-CM

## 2019-05-04 DIAGNOSIS — Z6838 Body mass index (BMI) 38.0-38.9, adult: Secondary | ICD-10-CM | POA: Diagnosis not present

## 2019-05-04 DIAGNOSIS — Z9189 Other specified personal risk factors, not elsewhere classified: Secondary | ICD-10-CM | POA: Diagnosis not present

## 2019-05-04 NOTE — Progress Notes (Signed)
Office: [phone]  /  Fax: [phone]   HPI:   Chief Complaint: OBESITY [name] is here to discuss her progress with her obesity treatment plan. She is on the Category 3 plan and is following her eating plan approximately 75 % of the time. She states she is walking for 20-30 minutes 5 times per week. [name] voices eating at night was very difficult at work, secondary to how busy work was. She works 4 days this week then off a week to go to [employer]. Most places should be manageable in terms of the meal plan.  Her weight is 224 lb (101.6 kg) today and has not lost weight since her last visit. She has lost 26 lbs since starting treatment with [name].  Hyperlipidemia [name] has hyperlipidemia and has been trying to improve her cholesterol levels with intensive lifestyle modification including a low saturated fat diet, exercise and weight loss. Last LDL was of 110 and HDL of 51. She is not on statin and denies any chest pain, claudication or myalgias.  At risk for cardiovascular disease [name] is at a higher than average risk for cardiovascular disease due to obesity and hyperlipidemia. She currently denies any chest pain.  Insulin Resistance [name] has a diagnosis of insulin resistance based on her elevated fasting insulin level >5. Last Hgb A1c was of 5.1 and insulin of 16.1. Although [name]'s blood glucose readings are still under good control, insulin resistance puts her at greater risk of metabolic syndrome and diabetes. She is not taking metformin currently and continues to work on diet and exercise to decrease risk of diabetes.  ASSESSMENT AND PLAN:  Other hyperlipidemia - Plan: Comprehensive metabolic panel, Lipid Panel With LDL/HDL Ratio  Insulin resistance - Plan: Hemoglobin A1c, Insulin, random  At risk for heart disease  Class 2 severe obesity with serious comorbidity and body mass index (BMI) of 38.0 to 38.9 in adult, unspecified obesity type ([name])  PLAN:  Hyperlipidemia [name] was  informed of the American Heart Association Guidelines emphasizing intensive lifestyle modifications as the first line treatment for hyperlipidemia. We discussed many lifestyle modifications today in depth, and [name] will continue to work on decreasing saturated fats such as fatty red meat, butter and many fried foods. She will also increase vegetables and lean protein in her diet and continue to work on exercise and weight loss efforts. We will check FLP today. [name] agrees to follow up with our clinic in 2 weeks.  Cardiovascular risk counseling [name] was given extended (15 minutes) coronary artery disease prevention counseling today. She is 48 y.o. female and has risk factors for heart disease including obesity and hyperlipidemia. We discussed intensive lifestyle modifications today with an emphasis on specific weight loss instructions and strategies. Pt was also informed of the importance of increasing exercise and decreasing saturated fats to help prevent heart disease.  Insulin Resistance [name] will continue to work on weight loss, exercise, and decreasing simple carbohydrates in her diet to help decrease the risk of diabetes. We dicussed metformin including benefits and risks. She was informed that eating too many simple carbohydrates or too many calories at one sitting increases the likelihood of GI side effects. [name] declined metformin for now and prescription was not written today. We will check Hgb A1c, insulin, and CMP today. Dois agrees to follow up with our clinic in 2 weeks as directed to monitor her progress.  Obesity [name] is currently in the action stage of change. As such, her goal is to continue with  weight loss efforts She has agreed to follow the Category 3 plan [name] has been instructed to work up to a goal of 150 minutes of combined cardio and strengthening exercise per week for weight loss and overall health benefits. We discussed the following Behavioral Modification Strategies today:  increasing lean protein intake, increasing vegetables and work on meal planning and easy cooking plans, keeping healthy foods in the home, and planning for success   [name] has agreed to follow up with our clinic in 2 weeks. She was informed of the importance of frequent follow up visits to maximize her success with intensive lifestyle modifications for her multiple health conditions.  ALLERGIES: Allergies  Allergen Reactions  . Codeine Other (See Comments)    "whole body turned bright red, swelled a little" with V codiene  . Lyrica [Pregabalin] Other (See Comments)    "suicidal thoughts"  . Nyquil Multi-Symptom [Pseudoeph-Doxylamine-Dm-Apap] Other (See Comments)    Turns skin red *all over body*, pt reports getting very hot   . Sulfa Antibiotics Other (See Comments)    "body turned red"    MEDICATIONS: Current Outpatient Medications on File Prior to Visit  Medication Sig Dispense Refill  . acetaZOLAMIDE (DIAMOX) 250 MG tablet Take 2 tablets (500 mg total) by mouth 2 (two) times daily. 360 tablet 3  . albuterol (PROVENTIL HFA;VENTOLIN HFA) 108 (90 Base) MCG/ACT inhaler Inhale 2 puffs into the lungs every 4 (four) hours as needed for wheezing or shortness of breath. 3 Inhaler 3  . Calcium Carbonate-Vitamin D (CALCIUM + D PO) Take 1 tablet by mouth daily. Calcium 600 mg, vit D3 300 mg    . cetirizine (ZYRTEC) 5 MG tablet Take 5 mg by mouth daily.    . Cyanocobalamin (VITAMIN B 12 PO) Take 5,000 mcg by mouth daily.     . Echinacea 125 MG CAPS Take 125 mg by mouth daily.     Marland Kitchen lisinopril-hydrochlorothiazide (PRINZIDE,ZESTORETIC) 20-12.5 MG tablet Take 1 tablet by mouth daily. 90 tablet 3  . Magnesium 250 MG TABS Take 250 mg by mouth daily.     Marnee [place] Omega-3 Krill Oil 500 MG CAPS Take 1 capsule by mouth daily.    . metroNIDAZOLE (METROCREAM) 0.75 % cream Apply topically 2 (two) times daily. 45 g 1  . montelukast (SINGULAIR) 10 MG tablet Take 1 tablet (10 mg total) by mouth at bedtime. 90  tablet 3  . Multiple Vitamin (MULTIVITAMIN) capsule Take 1 capsule by mouth daily.    . Oxymetazoline HCl (NASAL SPRAY NA) Place 2 sprays into the nose as needed (CONGESTION).     Marland Kitchen potassium chloride (K-DUR,KLOR-CON) 10 MEQ tablet TAKE 1 TABLET BY MOUTH TWICE A DAY (NEED APPT FOR FOLLOW UP AND LAB WORK) 60 tablet 6   No current facility-administered medications on file prior to visit.     PAST MEDICAL HISTORY: Past Medical History:  Diagnosis Date  . Asthma   . Chiari I malformation ([place])   . Chiari malformation 1996   decompression   . High blood pressure   . Hypertension   . Idiopathic intracranial hypertension   . Lactose intolerance   . Pseudotumor cerebri   . Subclavian bypass stenosis ([place]) 11/97   right  . Swallowing difficulty     PAST SURGICAL HISTORY: Past Surgical History:  Procedure Laterality Date  . APPENDECTOMY    . chiari decompression    . GANGLION CYST EXCISION Right    wrist  . LAPAROSCOPIC APPENDECTOMY N/A 10/28/2015   Procedure:  APPENDECTOMY LAPAROSCOPIC;  Surgeon: Armandina Gemma, MD;  Location: [facility];  Service: General;  Laterality: N/A;  . SUBCLAVIAN BYPASS GRAFT Right 07/1996  . WRIST FRACTURE SURGERY Right 02/2007    SOCIAL HISTORY: Social History   Tobacco Use  . Smoking status: Never Smoker  . Smokeless tobacco: Never Used  Substance Use Topics  . Alcohol use: No    Alcohol/week: 0.0 standard drinks  . Drug use: No    FAMILY HISTORY: Family History  Problem Relation Age of Onset  . Cataracts Mother   . Diabetes Mother   . Hypertension Mother   . High Cholesterol Mother   . Obesity Mother   . Diabetes Father   . Hypertension Father   . Obesity Father     ROS: Review of Systems  Constitutional: Negative for weight loss.  Cardiovascular: Negative for chest pain and claudication.  Musculoskeletal: Negative for myalgias.    PHYSICAL EXAM: Blood pressure 110/76, pulse 88, temperature 97.9 F (36.6 C), temperature source Oral,  height 5\' 4"  (1.626 m), weight 224 lb (101.6 kg), SpO2 99 %. Body mass index is 38.45 kg/m. Physical Exam Vitals signs reviewed.  Constitutional:      Appearance: Normal appearance. She is obese.  Cardiovascular:     Rate and Rhythm: Normal rate.     Pulses: Normal pulses.  Pulmonary:     Effort: Pulmonary effort is normal.     Breath sounds: Normal breath sounds.  Musculoskeletal: Normal range of motion.  Skin:    General: Skin is warm and dry.  Neurological:     Mental Status: She is alert and oriented to person, place, and time.  Psychiatric:        Mood and Affect: Mood normal.        Behavior: Behavior normal.     RECENT LABS AND TESTS: BMET    Component Value Date/Time   NA 138 01/29/2019 0836   K 3.4 (L) 01/29/2019 0836   CL 104 01/29/2019 0836   CO2 18 (L) 01/29/2019 0836   GLUCOSE 85 01/29/2019 0836   GLUCOSE 104 (H) 10/28/2015 2043   BUN 25 (H) 01/29/2019 0836   CREATININE 0.83 01/29/2019 0836   CALCIUM 9.1 01/29/2019 0836   GFRNONAA 85 01/29/2019 0836   GFRAA 98 01/29/2019 0836   Lab Results  Component Value Date   HGBA1C 5.1 01/29/2019   HGBA1C 5.2 09/08/2018   Lab Results  Component Value Date   INSULIN 16.1 01/29/2019   INSULIN 16.9 09/08/2018   CBC    Component Value Date/Time   WBC 7.2 07/28/2018 1302   WBC 15.0 (H) 10/28/2015 1931   RBC 4.59 07/28/2018 1302   RBC 4.37 10/28/2015 1931   HGB 14.2 07/28/2018 1302   HCT 42.3 07/28/2018 1302   PLT 243 07/28/2018 1302   MCV 92 07/28/2018 1302   MCH 30.9 07/28/2018 1302   MCH 30.2 10/28/2015 1931   MCHC 33.6 07/28/2018 1302   MCHC 34.0 10/28/2015 1931   RDW 12.0 (L) 07/28/2018 1302   LYMPHSABS 1.9 07/28/2018 1302   MONOABS 0.6 05/04/2015 0250   EOSABS 0.4 07/28/2018 1302   BASOSABS 0.0 07/28/2018 1302   Iron/TIBC/Ferritin/ %Sat No results found for: IRON, TIBC, FERRITIN, IRONPCTSAT Lipid Panel     Component Value Date/Time   CHOL 186 07/28/2018 1302   TRIG 124 07/28/2018 1302    HDL 51 07/28/2018 1302   CHOLHDL 3.6 07/28/2018 1302   LDLCALC 110 (H) 07/28/2018 1302   Hepatic Function Panel  Component Value Date/Time   PROT 6.6 01/29/2019 0836   ALBUMIN 4.3 01/29/2019 0836   AST 8 01/29/2019 0836   ALT 15 01/29/2019 0836   ALKPHOS 39 01/29/2019 0836   BILITOT 0.5 01/29/2019 0836      Component Value Date/Time   TSH 2.450 07/28/2018 1302   TSH 1.850 12/19/2016 1057   TSH 1.880 03/15/2015 0924      OBESITY BEHAVIORAL INTERVENTION VISIT  Today's visit was # 14   Starting weight: 250 lbs Starting date: 09/08/2018 Today's weight : 224 lbs  Today's date: 05/04/2019 Total lbs lost to date: 28    ASK: We discussed the diagnosis of obesity with [name] today and Cecille Rubin agreed to give [name] permission to discuss obesity behavioral modification therapy today.  ASSESS: Zyairah has the diagnosis of obesity and her BMI today is 38.43 Dharma is in the action stage of change   ADVISE: Ebany was educated on the multiple health risks of obesity as well as the benefit of weight loss to improve her health. She was advised of the need for long term treatment and the importance of lifestyle modifications to improve her current health and to decrease her risk of future health problems.  AGREE: Multiple dietary modification options and treatment options were discussed and  Drishti agreed to follow the recommendations documented in the above note.  ARRANGE: Mariaelena was educated on the importance of frequent visits to treat obesity as outlined per CMS and USPSTF guidelines and agreed to schedule her next follow up appointment today.  I, Trixie Dredge, am acting as transcriptionist for Ilene Qua, MD  I have reviewed the above documentation for accuracy and completeness, and I agree with the above. - Ilene Qua, MD

## 2019-05-05 LAB — LIPID PANEL WITH LDL/HDL RATIO
Cholesterol, Total: 163 mg/dL (ref 100–199)
HDL: 51 mg/dL (ref >39)
LDL Chol Calc (NIH): 92 mg/dL (ref 0–99)
LDL/HDL Ratio: 1.8 ratio (ref 0.0–3.2)
Triglycerides: 114 mg/dL (ref 0–149)
VLDL Cholesterol Cal: 20 mg/dL (ref 5–40)

## 2019-05-05 LAB — COMPREHENSIVE METABOLIC PANEL
ALT: 16 IU/L (ref 0–32)
AST: 12 IU/L (ref 0–40)
Albumin/Globulin Ratio: 1.8 (ref 1.2–2.2)
Albumin: 4.4 g/dL (ref 3.8–4.8)
Alkaline Phosphatase: 45 IU/L (ref 39–117)
BUN/Creatinine Ratio: 23 (ref 9–23)
BUN: 18 mg/dL (ref 6–24)
Bilirubin Total: 0.4 mg/dL (ref 0.0–1.2)
CO2: 18 mmol/L — ABNORMAL LOW (ref 20–29)
Calcium: 9.5 mg/dL (ref 8.7–10.2)
Chloride: 106 mmol/L (ref 96–106)
Creatinine, Ser: 0.8 mg/dL (ref 0.57–1.00)
GFR calc Af Amer: 103 mL/min/{1.73_m2} (ref >59)
GFR calc non Af Amer: 89 mL/min/{1.73_m2} (ref >59)
Globulin, Total: 2.4 g/dL (ref 1.5–4.5)
Glucose: 89 mg/dL (ref 65–99)
Potassium: 3.9 mmol/L (ref 3.5–5.2)
Sodium: 140 mmol/L (ref 134–144)
Total Protein: 6.8 g/dL (ref 6.0–8.5)

## 2019-05-05 LAB — HEMOGLOBIN A1C
Est. average glucose Bld gHb Est-mCnc: 100 mg/dL
Hgb A1c MFr Bld: 5.1 % (ref 4.8–5.6)

## 2019-05-05 LAB — INSULIN, RANDOM: INSULIN: 13.2 u[IU]/mL (ref 2.6–24.9)

## 2019-05-18 ENCOUNTER — Other Ambulatory Visit: Payer: Self-pay

## 2019-05-18 ENCOUNTER — Ambulatory Visit (INDEPENDENT_AMBULATORY_CARE_PROVIDER_SITE_OTHER): Payer: 59 | Admitting: Family Medicine

## 2019-05-18 ENCOUNTER — Encounter (INDEPENDENT_AMBULATORY_CARE_PROVIDER_SITE_OTHER): Payer: Self-pay | Admitting: Family Medicine

## 2019-05-18 VITALS — BP 103/71 | HR 84 | Temp 98.1°F | Ht 64.0 in | Wt 224.0 lb

## 2019-05-18 DIAGNOSIS — Z6838 Body mass index (BMI) 38.0-38.9, adult: Secondary | ICD-10-CM | POA: Diagnosis not present

## 2019-05-18 DIAGNOSIS — E8881 Metabolic syndrome: Secondary | ICD-10-CM | POA: Diagnosis not present

## 2019-05-18 DIAGNOSIS — E7849 Other hyperlipidemia: Secondary | ICD-10-CM

## 2019-05-18 NOTE — Progress Notes (Signed)
Office: [phone]  /  Fax: [phone]   HPI:   Chief Complaint: OBESITY [name] is here to discuss her progress with her obesity treatment plan. She is on the Category 3 plan and is following her eating plan approximately 80 % of the time. She states she is walking for 1-2 hours 4-5 times per week. [name] had a week long trip to [employer] with her sister and her mother. She did walk more than she thought they would. She states breakfast and supper were easy to stay on the plan, and lunch was often burgers on the go. She got back 3 days ago.  Her weight is 224 lb (101.6 kg) today and has not lost weight since her last visit. She has lost 26 lbs since starting treatment with [name].  Hyperlipidemia [name] has hyperlipidemia and has been trying to improve her cholesterol levels with intensive lifestyle modification including a low saturated fat diet, exercise and weight loss. Last LDL was on 05/04/2019 of 92 and HDL was of 51. She is not on statin and denies any chest pain, claudication or myalgias.  Insulin Resistance [name] has a diagnosis of insulin resistance based on her elevated fasting insulin level >5. Last insulin was of 13.2 and Hgb A1c was of 5.1. Although [name]'s blood glucose readings are still under good control, insulin resistance puts her at greater risk of metabolic syndrome and diabetes. She is not taking metformin currently and continues to work on diet and exercise to decrease risk of diabetes.  ASSESSMENT AND PLAN:  Other hyperlipidemia  Insulin resistance  Class 2 severe obesity with serious comorbidity and body mass index (BMI) of 38.0 to 38.9 in adult, unspecified obesity type ([place])  PLAN:  Hyperlipidemia [name] was informed of the American Heart Association Guidelines emphasizing intensive lifestyle modifications as the first line treatment for hyperlipidemia. We discussed many lifestyle modifications today in depth, and [name] will continue to work on decreasing saturated  fats such as fatty red meat, butter and many fried foods. She will also increase vegetables and lean protein in her diet and continue to work on exercise and weight loss efforts. We will repeat labs in early 2021.  Insulin Resistance [name] will continue to work on weight loss, exercise, and decreasing simple carbohydrates in her diet to help decrease the risk of diabetes. We dicussed metformin including benefits and risks. She was informed that eating too many simple carbohydrates or too many calories at one sitting increases the likelihood of GI side effects. We will repeat labs in January. Taitym agrees to follow up with our clinic in 2 weeks as directed to monitor her progress.  I spent > than 50% of the 15 minute visit on counseling as documented in the note.  Obesity [name] is currently in the action stage of change. As such, her goal is to continue with weight loss efforts She has agreed to follow the Category 3 plan [name] has been instructed to work up to a goal of 150 minutes of combined cardio and strengthening exercise per week or she is planning to start exercising at the gym again for weight loss and overall health benefits. We discussed the following Behavioral Modification Strategies today: increasing lean protein intake, increasing vegetables and work on meal planning and easy cooking plans, keeping healthy foods in the home, better snacking choices, and planning for success   [name] has agreed to follow up with our clinic in 2 weeks. She was informed of the importance of frequent follow  up visits to maximize her success with intensive lifestyle modifications for her multiple health conditions.  ALLERGIES: Allergies  Allergen Reactions  . Codeine Other (See Comments)    "whole body turned bright red, swelled a little" with V codiene  . Lyrica [Pregabalin] Other (See Comments)    "suicidal thoughts"  . Nyquil Multi-Symptom [Pseudoeph-Doxylamine-Dm-Apap] Other (See Comments)    Turns  skin red *all over body*, pt reports getting very hot   . Sulfa Antibiotics Other (See Comments)    "body turned red"    MEDICATIONS: Current Outpatient Medications on File Prior to Visit  Medication Sig Dispense Refill  . acetaZOLAMIDE (DIAMOX) 250 MG tablet Take 2 tablets (500 mg total) by mouth 2 (two) times daily. 360 tablet 3  . albuterol (PROVENTIL HFA;VENTOLIN HFA) 108 (90 Base) MCG/ACT inhaler Inhale 2 puffs into the lungs every 4 (four) hours as needed for wheezing or shortness of breath. 3 Inhaler 3  . Calcium Carbonate-Vitamin D (CALCIUM + D PO) Take 1 tablet by mouth daily. Calcium 600 mg, vit D3 300 mg    . cetirizine (ZYRTEC) 5 MG tablet Take 5 mg by mouth daily.    . Cyanocobalamin (VITAMIN B 12 PO) Take 5,000 mcg by mouth daily.     . Echinacea 125 MG CAPS Take 125 mg by mouth daily.     Marland Kitchen lisinopril-hydrochlorothiazide (PRINZIDE,ZESTORETIC) 20-12.5 MG tablet Take 1 tablet by mouth daily. 90 tablet 3  . Magnesium 250 MG TABS Take 250 mg by mouth daily.     Marnee [place] Omega-3 Krill Oil 500 MG CAPS Take 1 capsule by mouth daily.    . montelukast (SINGULAIR) 10 MG tablet Take 1 tablet (10 mg total) by mouth at bedtime. 90 tablet 3  . Multiple Vitamin (MULTIVITAMIN) capsule Take 1 capsule by mouth daily.    . Oxymetazoline HCl (NASAL SPRAY NA) Place 2 sprays into the nose as needed (CONGESTION).     Marland Kitchen potassium chloride (K-DUR,KLOR-CON) 10 MEQ tablet TAKE 1 TABLET BY MOUTH TWICE A DAY (NEED APPT FOR FOLLOW UP AND LAB WORK) 60 tablet 6  . metroNIDAZOLE (METROCREAM) 0.75 % cream Apply topically 2 (two) times daily. (Patient not taking: Reported on 05/18/2019) 45 g 1   No current facility-administered medications on file prior to visit.     PAST MEDICAL HISTORY: Past Medical History:  Diagnosis Date  . Asthma   . Chiari I malformation ([place])   . Chiari malformation 1996   decompression   . High blood pressure   . Hypertension   . Idiopathic intracranial hypertension   .  Lactose intolerance   . Pseudotumor cerebri   . Subclavian bypass stenosis ([place]) 11/97   right  . Swallowing difficulty     PAST SURGICAL HISTORY: Past Surgical History:  Procedure Laterality Date  . APPENDECTOMY    . chiari decompression    . GANGLION CYST EXCISION Right    wrist  . LAPAROSCOPIC APPENDECTOMY N/A 10/28/2015   Procedure: APPENDECTOMY LAPAROSCOPIC;  Surgeon: Armandina Gemma, MD;  Location: [facility];  Service: General;  Laterality: N/A;  . SUBCLAVIAN BYPASS GRAFT Right 07/1996  . WRIST FRACTURE SURGERY Right 02/2007    SOCIAL HISTORY: Social History   Tobacco Use  . Smoking status: Never Smoker  . Smokeless tobacco: Never Used  Substance Use Topics  . Alcohol use: No    Alcohol/week: 0.0 standard drinks  . Drug use: No    FAMILY HISTORY: Family History  Problem Relation Age of Onset  .  Cataracts Mother   . Diabetes Mother   . Hypertension Mother   . High Cholesterol Mother   . Obesity Mother   . Diabetes Father   . Hypertension Father   . Obesity Father     ROS: Review of Systems  Constitutional: Negative for weight loss.  Cardiovascular: Negative for chest pain and claudication.  Musculoskeletal: Negative for myalgias.    PHYSICAL EXAM: Blood pressure 103/71, pulse 84, temperature 98.1 F (36.7 C), temperature source Oral, height 5\' 4"  (1.626 m), weight 224 lb (101.6 kg), SpO2 97 %. Body mass index is 38.45 kg/m. Physical Exam Vitals signs reviewed.  Constitutional:      Appearance: Normal appearance. She is obese.  Cardiovascular:     Rate and Rhythm: Normal rate.     Pulses: Normal pulses.  Pulmonary:     Effort: Pulmonary effort is normal.     Breath sounds: Normal breath sounds.  Musculoskeletal: Normal range of motion.  Skin:    General: Skin is warm and dry.  Neurological:     Mental Status: She is alert and oriented to person, place, and time.  Psychiatric:        Mood and Affect: Mood normal.        Behavior: Behavior normal.      RECENT LABS AND TESTS: BMET    Component Value Date/Time   NA 140 05/04/2019 1122   K 3.9 05/04/2019 1122   CL 106 05/04/2019 1122   CO2 18 (L) 05/04/2019 1122   GLUCOSE 89 05/04/2019 1122   GLUCOSE 104 (H) 10/28/2015 2043   BUN 18 05/04/2019 1122   CREATININE 0.80 05/04/2019 1122   CALCIUM 9.5 05/04/2019 1122   GFRNONAA 89 05/04/2019 1122   GFRAA 103 05/04/2019 1122   Lab Results  Component Value Date   HGBA1C 5.1 05/04/2019   HGBA1C 5.1 01/29/2019   HGBA1C 5.2 09/08/2018   Lab Results  Component Value Date   INSULIN 13.2 05/04/2019   INSULIN 16.1 01/29/2019   INSULIN 16.9 09/08/2018   CBC    Component Value Date/Time   WBC 7.2 07/28/2018 1302   WBC 15.0 (H) 10/28/2015 1931   RBC 4.59 07/28/2018 1302   RBC 4.37 10/28/2015 1931   HGB 14.2 07/28/2018 1302   HCT 42.3 07/28/2018 1302   PLT 243 07/28/2018 1302   MCV 92 07/28/2018 1302   MCH 30.9 07/28/2018 1302   MCH 30.2 10/28/2015 1931   MCHC 33.6 07/28/2018 1302   MCHC 34.0 10/28/2015 1931   RDW 12.0 (L) 07/28/2018 1302   LYMPHSABS 1.9 07/28/2018 1302   MONOABS 0.6 05/04/2015 0250   EOSABS 0.4 07/28/2018 1302   BASOSABS 0.0 07/28/2018 1302   Iron/TIBC/Ferritin/ %Sat No results found for: IRON, TIBC, FERRITIN, IRONPCTSAT Lipid Panel     Component Value Date/Time   CHOL 163 05/04/2019 1122   TRIG 114 05/04/2019 1122   HDL 51 05/04/2019 1122   CHOLHDL 3.6 07/28/2018 1302   LDLCALC 110 (H) 07/28/2018 1302   Hepatic Function Panel     Component Value Date/Time   PROT 6.8 05/04/2019 1122   ALBUMIN 4.4 05/04/2019 1122   AST 12 05/04/2019 1122   ALT 16 05/04/2019 1122   ALKPHOS 45 05/04/2019 1122   BILITOT 0.4 05/04/2019 1122      Component Value Date/Time   TSH 2.450 07/28/2018 1302   TSH 1.850 12/19/2016 1057   TSH 1.880 03/15/2015 0924      OBESITY BEHAVIORAL INTERVENTION VISIT  Today's visit was #  15   Starting weight: 250 lbs Starting date: 09/08/2018 Today's weight : 224 lbs  Today's date: 05/18/2019 Total lbs lost to date: 60    ASK: We discussed the diagnosis of obesity with [name] today and Nyha agreed to give [name] permission to discuss obesity behavioral modification therapy today.  ASSESS: Kimyada has the diagnosis of obesity and her BMI today is 38.43 Jazmere is in the action stage of change   ADVISE: Kristina was educated on the multiple health risks of obesity as well as the benefit of weight loss to improve her health. She was advised of the need for long term treatment and the importance of lifestyle modifications to improve her current health and to decrease her risk of future health problems.  AGREE: Multiple dietary modification options and treatment options were discussed and  Darlette agreed to follow the recommendations documented in the above note.  ARRANGE: Barabara was educated on the importance of frequent visits to treat obesity as outlined per CMS and USPSTF guidelines and agreed to schedule her next follow up appointment today.  I, Trixie Dredge, am acting as transcriptionist for Ilene Qua, MD  I have reviewed the above documentation for accuracy and completeness, and I agree with the above. - Ilene Qua, MD

## 2019-05-25 MED FILL — MONTELUKAST SOD 10 MG TAB: 10 | 90 days supply | Qty: 90 | Fill #3

## 2019-06-01 ENCOUNTER — Other Ambulatory Visit: Payer: Self-pay

## 2019-06-01 ENCOUNTER — Ambulatory Visit (INDEPENDENT_AMBULATORY_CARE_PROVIDER_SITE_OTHER): Payer: 59 | Admitting: Family Medicine

## 2019-06-01 ENCOUNTER — Encounter (INDEPENDENT_AMBULATORY_CARE_PROVIDER_SITE_OTHER): Payer: Self-pay | Admitting: Family Medicine

## 2019-06-01 VITALS — BP 111/67 | HR 88 | Temp 97.8°F | Ht 64.0 in | Wt 228.0 lb

## 2019-06-01 DIAGNOSIS — I10 Essential (primary) hypertension: Secondary | ICD-10-CM | POA: Diagnosis not present

## 2019-06-01 DIAGNOSIS — J301 Allergic rhinitis due to pollen: Secondary | ICD-10-CM

## 2019-06-01 DIAGNOSIS — Z6839 Body mass index (BMI) 39.0-39.9, adult: Secondary | ICD-10-CM

## 2019-06-02 NOTE — Progress Notes (Signed)
Office: [phone]  /  Fax: [phone]   HPI:   Chief Complaint: OBESITY [name] is here to discuss her progress with her obesity treatment plan. She is on the Category 3 plan and is following her eating plan approximately 80 % of the time. She states she is walking with stairs for 45 minutes 4 times per week. [name] had a birthday during last 2 weeks, and indulged in carrot cake and baked potato. She notes several days of not eating all of the food and several days of not sleeping well.   Her weight is 228 lb (103.4 kg) today and has gained 4 lbs since her last visit. She has lost 22 lbs since starting treatment with [name].  Allergic Rhinitis [name] is on 1/2 dose Zyrtec. She gets yearly sinus infections around this time.  Hypertension [name] is a 48 y.o. female with hypertension. Azaliah's blood pressure was controlled. She denies chest pain, chest pressure, or headaches. She is on lisinopril-hydrochlorothiazide. She is working on weight loss to help control her blood pressure with the goal of decreasing her risk of heart attack and stroke.   ASSESSMENT AND PLAN:  Allergic rhinitis due to pollen, unspecified seasonality  Essential hypertension  Class 2 severe obesity with serious comorbidity and body mass index (BMI) of 39.0 to 39.9 in adult, unspecified obesity type ([place])  PLAN:  Allergic Rhinitis [name] agrees to continue taking Zyrtec, and she will follow up with her primary care physician as needed for further management. [name] agrees to follow up with our clinic in 2 weeks.   Hypertension We discussed sodium restriction, working on healthy weight loss, and a regular exercise program as the means to achieve improved blood pressure control. [name] agreed with this plan and agreed to follow up as directed. We will continue to monitor her blood pressure as well as her progress with the above lifestyle modifications. [name] agrees to continue taking lisinopril-hydrochlorothiazide and  will watch for signs of hypotension as she continues her lifestyle modifications. [name] agrees to follow up with our clinic in 2 weeks.  I spent > than 50% of the 15 minute visit on counseling as documented in the note.  Obesity [name] is currently in the action stage of change. As such, her goal is to continue with weight loss efforts She has agreed to follow the Category 3 plan [name] has been instructed to work up to a goal of 150 minutes of combined cardio and strengthening exercise per week for weight loss and overall health benefits. We discussed the following Behavioral Modification Strategies today: increasing lean protein intake, increasing vegetables and work on meal planning and easy cooking plans, keeping healthy foods in the home, and planning for success [name] voices she wants to make a commitment to start exercising.  [name] has agreed to follow up with our clinic in 2 weeks. She was informed of the importance of frequent follow up visits to maximize her success with intensive lifestyle modifications for her multiple health conditions.  ALLERGIES: Allergies  Allergen Reactions  . Codeine Other (See Comments)    "whole body turned bright red, swelled a little" with V codiene  . Lyrica [Pregabalin] Other (See Comments)    "suicidal thoughts"  . Nyquil Multi-Symptom [Pseudoeph-Doxylamine-Dm-Apap] Other (See Comments)    Turns skin red *all over body*, pt reports getting very hot   . Sulfa Antibiotics Other (See Comments)    "body turned red"    MEDICATIONS: Current Outpatient Medications on File Prior to  Visit  Medication Sig Dispense Refill  . acetaZOLAMIDE (DIAMOX) 250 MG tablet Take 2 tablets (500 mg total) by mouth 2 (two) times daily. 360 tablet 3  . albuterol (PROVENTIL HFA;VENTOLIN HFA) 108 (90 Base) MCG/ACT inhaler Inhale 2 puffs into the lungs every 4 (four) hours as needed for wheezing or shortness of breath. 3 Inhaler 3  . Calcium Carbonate-Vitamin D (CALCIUM + D PO)  Take 1 tablet by mouth daily. Calcium 600 mg, vit D3 300 mg    . cetirizine (ZYRTEC) 5 MG tablet Take 5 mg by mouth daily.    . Cyanocobalamin (VITAMIN B 12 PO) Take 5,000 mcg by mouth daily.     . Echinacea 125 MG CAPS Take 125 mg by mouth daily.     [name] lisinopril-hydrochlorothiazide (PRINZIDE,ZESTORETIC) 20-12.5 MG tablet Take 1 tablet by mouth daily. 90 tablet 3  . Magnesium 250 MG TABS Take 250 mg by mouth daily.     [name] Omega-3 Krill Oil 500 MG CAPS Take 1 capsule by mouth daily.    . metroNIDAZOLE (METROCREAM) 0.75 % cream Apply topically 2 (two) times daily. 45 g 1  . montelukast (SINGULAIR) 10 MG tablet Take 1 tablet (10 mg total) by mouth at bedtime. 90 tablet 3  . Multiple Vitamin (MULTIVITAMIN) capsule Take 1 capsule by mouth daily.    . Oxymetazoline HCl (NASAL SPRAY NA) Place 2 sprays into the nose as needed (CONGESTION).     [name] potassium chloride (K-DUR,KLOR-CON) 10 MEQ tablet TAKE 1 TABLET BY MOUTH TWICE A DAY (NEED APPT FOR FOLLOW UP AND LAB WORK) 60 tablet 6   No current facility-administered medications on file prior to visit.     PAST MEDICAL HISTORY: Past Medical History:  Diagnosis Date  . Asthma   . Chiari I malformation ([place])   . Chiari malformation 1996   decompression   . High blood pressure   . Hypertension   . Idiopathic intracranial hypertension   . Lactose intolerance   . Pseudotumor cerebri   . Subclavian bypass stenosis ([place]) 11/97   right  . Swallowing difficulty     PAST SURGICAL HISTORY: Past Surgical History:  Procedure Laterality Date  . APPENDECTOMY    . chiari decompression    . GANGLION CYST EXCISION Right    wrist  . LAPAROSCOPIC APPENDECTOMY N/A 10/28/2015   Procedure: APPENDECTOMY LAPAROSCOPIC;  Surgeon: Armandina Gemma, MD;  Location: [facility];  Service: General;  Laterality: N/A;  . SUBCLAVIAN BYPASS GRAFT Right 07/1996  . WRIST FRACTURE SURGERY Right 02/2007    SOCIAL HISTORY: Social History   Tobacco Use  . Smoking status:  Never Smoker  . Smokeless tobacco: Never Used  Substance Use Topics  . Alcohol use: No    Alcohol/week: 0.0 standard drinks  . Drug use: No    FAMILY HISTORY: Family History  Problem Relation Age of Onset  . Cataracts Mother   . Diabetes Mother   . Hypertension Mother   . High Cholesterol Mother   . Obesity Mother   . Diabetes Father   . Hypertension Father   . Obesity Father     ROS: Review of Systems  Constitutional: Negative for weight loss.  Cardiovascular: Negative for chest pain.       Negative chest pressure  Neurological: Negative for headaches.    PHYSICAL EXAM: Blood pressure 111/67, pulse 88, temperature 97.8 F (36.6 C), temperature source Oral, height 5\' 4"  (1.626 m), weight 228 lb (103.4 kg), SpO2 98 %. Body mass index  is 39.14 kg/m. Physical Exam Vitals signs reviewed.  Constitutional:      Appearance: Normal appearance. She is obese.  Cardiovascular:     Rate and Rhythm: Normal rate.     Pulses: Normal pulses.  Pulmonary:     Effort: Pulmonary effort is normal.     Breath sounds: Normal breath sounds.  Musculoskeletal: Normal range of motion.  Skin:    General: Skin is warm and dry.  Neurological:     Mental Status: She is alert and oriented to person, place, and time.  Psychiatric:        Mood and Affect: Mood normal.        Behavior: Behavior normal.     RECENT LABS AND TESTS: BMET    Component Value Date/Time   NA 140 05/04/2019 1122   K 3.9 05/04/2019 1122   CL 106 05/04/2019 1122   CO2 18 (L) 05/04/2019 1122   GLUCOSE 89 05/04/2019 1122   GLUCOSE 104 (H) 10/28/2015 2043   BUN 18 05/04/2019 1122   CREATININE 0.80 05/04/2019 1122   CALCIUM 9.5 05/04/2019 1122   GFRNONAA 89 05/04/2019 1122   GFRAA 103 05/04/2019 1122   Lab Results  Component Value Date   HGBA1C 5.1 05/04/2019   HGBA1C 5.1 01/29/2019   HGBA1C 5.2 09/08/2018   Lab Results  Component Value Date   INSULIN 13.2 05/04/2019   INSULIN 16.1 01/29/2019   INSULIN  16.9 09/08/2018   CBC    Component Value Date/Time   WBC 7.2 07/28/2018 1302   WBC 15.0 (H) 10/28/2015 1931   RBC 4.59 07/28/2018 1302   RBC 4.37 10/28/2015 1931   HGB 14.2 07/28/2018 1302   HCT 42.3 07/28/2018 1302   PLT 243 07/28/2018 1302   MCV 92 07/28/2018 1302   MCH 30.9 07/28/2018 1302   MCH 30.2 10/28/2015 1931   MCHC 33.6 07/28/2018 1302   MCHC 34.0 10/28/2015 1931   RDW 12.0 (L) 07/28/2018 1302   LYMPHSABS 1.9 07/28/2018 1302   MONOABS 0.6 05/04/2015 0250   EOSABS 0.4 07/28/2018 1302   BASOSABS 0.0 07/28/2018 1302   Iron/TIBC/Ferritin/ %Sat No results found for: IRON, TIBC, FERRITIN, IRONPCTSAT Lipid Panel     Component Value Date/Time   CHOL 163 05/04/2019 1122   TRIG 114 05/04/2019 1122   HDL 51 05/04/2019 1122   CHOLHDL 3.6 07/28/2018 1302   LDLCALC 92 05/04/2019 1122   Hepatic Function Panel     Component Value Date/Time   PROT 6.8 05/04/2019 1122   ALBUMIN 4.4 05/04/2019 1122   AST 12 05/04/2019 1122   ALT 16 05/04/2019 1122   ALKPHOS 45 05/04/2019 1122   BILITOT 0.4 05/04/2019 1122      Component Value Date/Time   TSH 2.450 07/28/2018 1302   TSH 1.850 12/19/2016 1057   TSH 1.880 03/15/2015 0924      OBESITY BEHAVIORAL INTERVENTION VISIT  Today's visit was # 16   Starting weight: 250 lbs Starting date: 09/08/2018 Today's weight : 228 lbs  Today's date: 06/01/2019 Total lbs lost to date: 75    ASK: We discussed the diagnosis of obesity with [name] today and Cecille Rubin agreed to give [name] permission to discuss obesity behavioral modification therapy today.  ASSESS: Zenaida has the diagnosis of obesity and her BMI today is 39.12 Margareth is in the action stage of change   ADVISE: Drucilla was educated on the multiple health risks of obesity as well as the benefit of weight loss to improve her  health. She was advised of the need for long term treatment and the importance of lifestyle modifications to improve her current health and to decrease  her risk of future health problems.  AGREE: Multiple dietary modification options and treatment options were discussed and  Vannary agreed to follow the recommendations documented in the above note.  ARRANGE: Adlih was educated on the importance of frequent visits to treat obesity as outlined per CMS and USPSTF guidelines and agreed to schedule her next follow up appointment today.  I, Trixie Dredge, am acting as transcriptionist for Ilene Qua, MD  I have reviewed the above documentation for accuracy and completeness, and I agree with the above. - Ilene Qua, MD

## 2019-06-17 ENCOUNTER — Telehealth (INDEPENDENT_AMBULATORY_CARE_PROVIDER_SITE_OTHER): Payer: 59 | Admitting: Family Medicine

## 2019-06-17 ENCOUNTER — Other Ambulatory Visit: Payer: Self-pay

## 2019-06-17 ENCOUNTER — Encounter (INDEPENDENT_AMBULATORY_CARE_PROVIDER_SITE_OTHER): Payer: Self-pay | Admitting: Family Medicine

## 2019-06-17 DIAGNOSIS — Z6838 Body mass index (BMI) 38.0-38.9, adult: Secondary | ICD-10-CM

## 2019-06-17 DIAGNOSIS — I10 Essential (primary) hypertension: Secondary | ICD-10-CM | POA: Diagnosis not present

## 2019-06-17 DIAGNOSIS — E8881 Metabolic syndrome: Secondary | ICD-10-CM

## 2019-06-18 NOTE — Progress Notes (Signed)
Office: [phone]  /  Fax: [phone] TeleHealth Visit:  [name] has verbally consented to this TeleHealth visit today. The patient is located at home, the provider is located at the [facility] office. The participants in this visit include the listed provider and patient. The visit was conducted today via webex.  HPI:   Chief Complaint: OBESITY [name] is here to discuss her progress with her obesity treatment plan. She is on the Category 3 plan and is following her eating plan approximately 85-90 % of the time. She states she is going to the gym for 60 minutes 3 times per week. [name] is having a telehealth appointment secondary to exposure at work. She notes work was very busy last night. She has gotten more on track on the meal plan in the past few weeks. She denies hunger. She feels she has lost weight but doesn't have a scale at home. We were unable to weigh the patient today for this TeleHealth visit. She feels as if she has lost weight since her last visit. She has lost 22 lbs since starting treatment with [name].  Insulin Resistance [name] has a diagnosis of insulin resistance based on her elevated fasting insulin level >5. Although [name]'s blood glucose readings are still under good control, insulin resistance puts her at greater risk of metabolic syndrome and diabetes. She is not taking metformin currently and notes minimal occasional carbohydrate cravings. She continues to work on diet and exercise to decrease risk of diabetes.  Hypertension [name] is a 48 y.o. female with hypertension. [name]'s blood pressure was previously controlled at her office appointment. She denies chest pain, chest pressure, or headaches. She is working on weight loss to help control her blood pressure with the goal of decreasing her risk of heart attack and stroke.   ASSESSMENT AND PLAN:  Essential hypertension  Insulin resistance  Class 2 severe obesity with serious  comorbidity and body mass index (BMI) of 38.0 to 38.9 in adult, unspecified obesity type ([name])  PLAN:  Insulin Resistance [name] will continue to work on weight loss, exercise, and decreasing simple carbohydrates in her diet to help decrease the risk of diabetes. We dicussed metformin including benefits and risks. She was informed that eating too many simple carbohydrates or too many calories at one sitting increases the likelihood of GI side effects. We will repeat labs in early December. [name] agrees to follow up with our clinic in 2 weeks as directed to monitor her progress.  Hypertension We discussed sodium restriction, working on healthy weight loss, and a regular exercise program as the means to achieve improved blood pressure control. [name] agreed with this plan and agreed to follow up as directed. We will continue to monitor her blood pressure as well as her progress with the above lifestyle modifications. [name] agrees to continue taking lisinopril-hydrochlorothiazide, no change in dose. She will watch for signs of hypotension as she continues her lifestyle modifications. [name] agrees to follow up with our clinic in 2 weeks.  Obesity [name] is currently in the action stage of change. As such, her goal is to continue with weight loss efforts She has agreed to follow the Category 3 plan [name] has been instructed to work up to a goal of 150 minutes of combined cardio and strengthening exercise per week for weight loss and overall health benefits. We discussed the following Behavioral Modification Strategies today: increasing lean protein intake, increasing vegetables and work on meal planning and easy cooking  plans, keeping healthy foods in the home, and planning for success   [name] has agreed to follow up with our clinic in 2 weeks with Kylie Schwab, FNP-C. She was informed of the importance of frequent follow up visits to maximize her success with intensive lifestyle modifications for her multiple  health conditions.  ALLERGIES: Allergies  Allergen Reactions  . Codeine Other (See Comments)    "whole body turned bright red, swelled a little" with V codiene  . Lyrica [Pregabalin] Other (See Comments)    "suicidal thoughts"  . Nyquil Multi-Symptom [Pseudoeph-Doxylamine-Dm-Apap] Other (See Comments)    Turns skin red *all over body*, pt reports getting very hot   . Sulfa Antibiotics Other (See Comments)    "body turned red"    MEDICATIONS: Current Outpatient Medications on File Prior to Visit  Medication Sig Dispense Refill  . acetaZOLAMIDE (DIAMOX) 250 MG tablet Take 2 tablets (500 mg total) by mouth 2 (two) times daily. 360 tablet 3  . albuterol (PROVENTIL HFA;VENTOLIN HFA) 108 (90 Base) MCG/ACT inhaler Inhale 2 puffs into the lungs every 4 (four) hours as needed for wheezing or shortness of breath. 3 Inhaler 3  . Calcium Carbonate-Vitamin D (CALCIUM + D PO) Take 1 tablet by mouth daily. Calcium 600 mg, vit D3 300 mg    . cetirizine (ZYRTEC) 5 MG tablet Take 5 mg by mouth daily.    . Cyanocobalamin (VITAMIN B 12 PO) Take 5,000 mcg by mouth daily.     . Echinacea 125 MG CAPS Take 125 mg by mouth daily.     Marland Kitchen lisinopril-hydrochlorothiazide (PRINZIDE,ZESTORETIC) 20-12.5 MG tablet Take 1 tablet by mouth daily. 90 tablet 3  . Magnesium 250 MG TABS Take 250 mg by mouth daily.     [name] Omega-3 Krill Oil 500 MG CAPS Take 1 capsule by mouth daily.    . metroNIDAZOLE (METROCREAM) 0.75 % cream Apply topically 2 (two) times daily. 45 g 1  . montelukast (SINGULAIR) 10 MG tablet Take 1 tablet (10 mg total) by mouth at bedtime. 90 tablet 3  . Multiple Vitamin (MULTIVITAMIN) capsule Take 1 capsule by mouth daily.    . Oxymetazoline HCl (NASAL SPRAY NA) Place 2 sprays into the nose as needed (CONGESTION).     Marland Kitchen potassium chloride (K-DUR,KLOR-CON) 10 MEQ tablet TAKE 1 TABLET BY MOUTH TWICE A DAY (NEED APPT FOR FOLLOW UP AND LAB WORK) (Patient not taking: Reported on 06/17/2019) 60 tablet 6    No current facility-administered medications on file prior to visit.     PAST MEDICAL HISTORY: Past Medical History:  Diagnosis Date  . Asthma   . Chiari I malformation ([place])   . Chiari malformation 1996   decompression   . High blood pressure   . Hypertension   . Idiopathic intracranial hypertension   . Lactose intolerance   . Pseudotumor cerebri   . Subclavian bypass stenosis ([place]) 11/97   right  . Swallowing difficulty     PAST SURGICAL HISTORY: Past Surgical History:  Procedure Laterality Date  . APPENDECTOMY    . chiari decompression    . GANGLION CYST EXCISION Right    wrist  . LAPAROSCOPIC APPENDECTOMY N/A 10/28/2015   Procedure: APPENDECTOMY LAPAROSCOPIC;  Surgeon: Armandina Gemma, MD;  Location: [facility];  Service: General;  Laterality: N/A;  . SUBCLAVIAN BYPASS GRAFT Right 07/1996  . WRIST FRACTURE SURGERY Right 02/2007    SOCIAL HISTORY: Social History   Tobacco Use  . Smoking status: Never Smoker  . Smokeless tobacco:  Never Used  Substance Use Topics  . Alcohol use: No    Alcohol/week: 0.0 standard drinks  . Drug use: No    FAMILY HISTORY: Family History  Problem Relation Age of Onset  . Cataracts Mother   . Diabetes Mother   . Hypertension Mother   . High Cholesterol Mother   . Obesity Mother   . Diabetes Father   . Hypertension Father   . Obesity Father     ROS: Review of Systems  Constitutional: Positive for weight loss.  Cardiovascular: Negative for chest pain.       Negative chest pressure  Neurological: Negative for headaches.    PHYSICAL EXAM: Pt in no acute distress  RECENT LABS AND TESTS: BMET    Component Value Date/Time   NA 140 05/04/2019 1122   K 3.9 05/04/2019 1122   CL 106 05/04/2019 1122   CO2 18 (L) 05/04/2019 1122   GLUCOSE 89 05/04/2019 1122   GLUCOSE 104 (H) 10/28/2015 2043   BUN 18 05/04/2019 1122   CREATININE 0.80 05/04/2019 1122   CALCIUM 9.5 05/04/2019 1122   GFRNONAA 89 05/04/2019 1122   GFRAA 103  05/04/2019 1122   Lab Results  Component Value Date   HGBA1C 5.1 05/04/2019   HGBA1C 5.1 01/29/2019   HGBA1C 5.2 09/08/2018   Lab Results  Component Value Date   INSULIN 13.2 05/04/2019   INSULIN 16.1 01/29/2019   INSULIN 16.9 09/08/2018   CBC    Component Value Date/Time   WBC 7.2 07/28/2018 1302   WBC 15.0 (H) 10/28/2015 1931   RBC 4.59 07/28/2018 1302   RBC 4.37 10/28/2015 1931   HGB 14.2 07/28/2018 1302   HCT 42.3 07/28/2018 1302   PLT 243 07/28/2018 1302   MCV 92 07/28/2018 1302   MCH 30.9 07/28/2018 1302   MCH 30.2 10/28/2015 1931   MCHC 33.6 07/28/2018 1302   MCHC 34.0 10/28/2015 1931   RDW 12.0 (L) 07/28/2018 1302   LYMPHSABS 1.9 07/28/2018 1302   MONOABS 0.6 05/04/2015 0250   EOSABS 0.4 07/28/2018 1302   BASOSABS 0.0 07/28/2018 1302   Iron/TIBC/Ferritin/ %Sat No results found for: IRON, TIBC, FERRITIN, IRONPCTSAT Lipid Panel     Component Value Date/Time   CHOL 163 05/04/2019 1122   TRIG 114 05/04/2019 1122   HDL 51 05/04/2019 1122   CHOLHDL 3.6 07/28/2018 1302   LDLCALC 92 05/04/2019 1122   Hepatic Function Panel     Component Value Date/Time   PROT 6.8 05/04/2019 1122   ALBUMIN 4.4 05/04/2019 1122   AST 12 05/04/2019 1122   ALT 16 05/04/2019 1122   ALKPHOS 45 05/04/2019 1122   BILITOT 0.4 05/04/2019 1122      Component Value Date/Time   TSH 2.450 07/28/2018 1302   TSH 1.850 12/19/2016 1057   TSH 1.880 03/15/2015 0924      I, Trixie Dredge, am acting as transcriptionist for Ilene Qua, MD  I have reviewed the above documentation for accuracy and completeness, and I agree with the above. - Ilene Qua, MD

## 2019-06-19 ENCOUNTER — Encounter: Payer: Self-pay | Admitting: *Deleted

## 2019-07-06 ENCOUNTER — Encounter (INDEPENDENT_AMBULATORY_CARE_PROVIDER_SITE_OTHER): Payer: Self-pay | Admitting: Family Medicine

## 2019-07-06 ENCOUNTER — Ambulatory Visit (INDEPENDENT_AMBULATORY_CARE_PROVIDER_SITE_OTHER): Payer: 59 | Admitting: Family Medicine

## 2019-07-06 ENCOUNTER — Other Ambulatory Visit: Payer: Self-pay

## 2019-07-06 VITALS — BP 112/72 | HR 80 | Temp 98.4°F | Ht 64.0 in | Wt 226.0 lb

## 2019-07-06 DIAGNOSIS — Z6838 Body mass index (BMI) 38.0-38.9, adult: Secondary | ICD-10-CM | POA: Diagnosis not present

## 2019-07-06 DIAGNOSIS — E8881 Metabolic syndrome: Secondary | ICD-10-CM

## 2019-07-07 NOTE — Progress Notes (Signed)
Office: [phone]  /  Fax: [phone]   HPI:   Chief Complaint: OBESITY [name] is here to discuss her progress with her obesity treatment plan. She is on the Category 3 plan and is following her eating plan approximately 80 % of the time. She states she is doing cardio exercise 60 minutes 3 times per week. 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 She sometimes has to skip meals due to a lack of a meal break at work.  Her weight is 226 lb (102.5 kg) today and has had a weight loss of 2 pounds since her last in-office visit. She has lost 24 lbs since starting treatment with [name].  Insulin Resistance [name] has a diagnosis of insulin resistance based on her elevated fasting insulin level >5. Although [name]'s blood glucose readings are still under good control, insulin resistance puts her at greater risk of metabolic syndrome and diabetes. She is not taking metformin currently and continues to work on diet and exercise to decrease risk of diabetes. [name] denies polyphagia. Lab Results  Component Value Date   HGBA1C 5.1 05/04/2019    ASSESSMENT AND PLAN:  Insulin resistance  Class 2 severe obesity with serious comorbidity and body mass index (BMI) of 38.0 to 38.9 in adult, unspecified obesity type ([place])  PLAN:  Insulin Resistance [name] will continue to work on weight loss, exercise, and decreasing simple carbohydrates in her diet to help decrease the risk of diabetes. [name] will continue with the meal plan and she will follow up with [name] as directed to monitor her progress.  Obesity [name] is currently in the action stage of change. As such, her goal is to continue with weight loss efforts She has agreed to follow the Category 3 plan [name] will continue cardio exercise for 60 minutes, 3 times per week for weight loss and overall health benefits. We discussed the following Behavioral Modification Strategies today: planning for success, increasing lean protein intake and dealing with  family or coworker sabotage  We discussed trying to make up protein when she misses her meal at work. She may have a protein bar (with at least 20 gms of protein) when she is unable to have a meal at work. Recipes for chicken soups were given to patient today.  [name] has agreed to follow up with our clinic in 2 weeks. She was informed of the importance of frequent follow up visits to maximize her success with intensive lifestyle modifications for her multiple health conditions.  ALLERGIES: Allergies  Allergen Reactions  . Codeine Other (See Comments)    "whole body turned bright red, swelled a little" with V codiene  . Lyrica [Pregabalin] Other (See Comments)    "suicidal thoughts"  . Nyquil Multi-Symptom [Pseudoeph-Doxylamine-Dm-Apap] Other (See Comments)    Turns skin red *all over body*, pt reports getting very hot   . Sulfa Antibiotics Other (See Comments)    "body turned red"    MEDICATIONS: Current Outpatient Medications on File Prior to Visit  Medication Sig Dispense Refill  . acetaZOLAMIDE (DIAMOX) 250 MG tablet Take 2 tablets (500 mg total) by mouth 2 (two) times daily. 360 tablet 3  . albuterol (PROVENTIL HFA;VENTOLIN HFA) 108 (90 Base) MCG/ACT inhaler Inhale 2 puffs into the lungs every 4 (four) hours as needed for wheezing or shortness of breath. 3 Inhaler 3  . Calcium Carbonate-Vitamin D (CALCIUM + D PO) Take 1 tablet by mouth daily. Calcium 600 mg, vit D3 300 mg    .  cetirizine (ZYRTEC) 5 MG tablet Take 5 mg by mouth daily.    . cholecalciferol (VITAMIN D3) 25 MCG (1000 UT) tablet Take 1,000 Units by mouth daily.    . Cyanocobalamin (VITAMIN B 12 PO) Take 5,000 mcg by mouth daily.     . Echinacea 125 MG CAPS Take 125 mg by mouth daily.     Marland Kitchen lisinopril-hydrochlorothiazide (PRINZIDE,ZESTORETIC) 20-12.5 MG tablet Take 1 tablet by mouth daily. 90 tablet 3  . Magnesium 250 MG TABS Take 250 mg by mouth daily.     Marnee Spring Omega-3 Krill Oil 500 MG CAPS Take 1 capsule by mouth  daily.    . metroNIDAZOLE (METROCREAM) 0.75 % cream Apply topically 2 (two) times daily. 45 g 1  . montelukast (SINGULAIR) 10 MG tablet Take 1 tablet (10 mg total) by mouth at bedtime. 90 tablet 3  . Multiple Vitamin (MULTIVITAMIN) capsule Take 1 capsule by mouth daily.    . Oxymetazoline HCl (NASAL SPRAY NA) Place 2 sprays into the nose as needed (CONGESTION).     Marland Kitchen potassium chloride (K-DUR,KLOR-CON) 10 MEQ tablet TAKE 1 TABLET BY MOUTH TWICE A DAY (NEED APPT FOR FOLLOW UP AND LAB WORK) 60 tablet 6   No current facility-administered medications on file prior to visit.     PAST MEDICAL HISTORY: Past Medical History:  Diagnosis Date  . Asthma   . Chiari I malformation ([place])   . Chiari malformation 1996   decompression   . High blood pressure   . Hypertension   . Idiopathic intracranial hypertension   . Lactose intolerance   . Pseudotumor cerebri   . Subclavian bypass stenosis ([place]) 11/97   right  . Swallowing difficulty     PAST SURGICAL HISTORY: Past Surgical History:  Procedure Laterality Date  . APPENDECTOMY    . chiari decompression    . GANGLION CYST EXCISION Right    wrist  . LAPAROSCOPIC APPENDECTOMY N/A 10/28/2015   Procedure: APPENDECTOMY LAPAROSCOPIC;  Surgeon: Armandina Gemma, MD;  Location: [facility];  Service: General;  Laterality: N/A;  . SUBCLAVIAN BYPASS GRAFT Right 07/1996  . WRIST FRACTURE SURGERY Right 02/2007    SOCIAL HISTORY: Social History   Tobacco Use  . Smoking status: Never Smoker  . Smokeless tobacco: Never Used  Substance Use Topics  . Alcohol use: No    Alcohol/week: 0.0 standard drinks  . Drug use: No    FAMILY HISTORY: Family History  Problem Relation Age of Onset  . Cataracts Mother   . Diabetes Mother   . Hypertension Mother   . High Cholesterol Mother   . Obesity Mother   . Diabetes Father   . Hypertension Father   . Obesity Father     ROS: Review of Systems  Constitutional: Positive for weight loss.  Endo/Heme/Allergies:        Negative for polyphagia    PHYSICAL EXAM: Blood pressure 112/72, pulse 80, temperature 98.4 F (36.9 C), temperature source Oral, height 5\' 4"  (1.626 m), weight 226 lb (102.5 kg), last menstrual period 06/29/2019, SpO2 97 %. Body mass index is 38.79 kg/m. Physical Exam Vitals signs reviewed.  Constitutional:      Appearance: Normal appearance. She is well-developed. She is obese.  Cardiovascular:     Rate and Rhythm: Normal rate.  Pulmonary:     Effort: Pulmonary effort is normal.  Musculoskeletal: Normal range of motion.  Skin:    General: Skin is warm and dry.  Neurological:     Mental Status: She is  alert and oriented to person, place, and time.  Psychiatric:        Mood and Affect: Mood normal.        Behavior: Behavior normal.     RECENT LABS AND TESTS: BMET    Component Value Date/Time   NA 140 05/04/2019 1122   K 3.9 05/04/2019 1122   CL 106 05/04/2019 1122   CO2 18 (L) 05/04/2019 1122   GLUCOSE 89 05/04/2019 1122   GLUCOSE 104 (H) 10/28/2015 2043   BUN 18 05/04/2019 1122   CREATININE 0.80 05/04/2019 1122   CALCIUM 9.5 05/04/2019 1122   GFRNONAA 89 05/04/2019 1122   GFRAA 103 05/04/2019 1122   Lab Results  Component Value Date   HGBA1C 5.1 05/04/2019   HGBA1C 5.1 01/29/2019   HGBA1C 5.2 09/08/2018   Lab Results  Component Value Date   INSULIN 13.2 05/04/2019   INSULIN 16.1 01/29/2019   INSULIN 16.9 09/08/2018   CBC    Component Value Date/Time   WBC 7.2 07/28/2018 1302   WBC 15.0 (H) 10/28/2015 1931   RBC 4.59 07/28/2018 1302   RBC 4.37 10/28/2015 1931   HGB 14.2 07/28/2018 1302   HCT 42.3 07/28/2018 1302   PLT 243 07/28/2018 1302   MCV 92 07/28/2018 1302   MCH 30.9 07/28/2018 1302   MCH 30.2 10/28/2015 1931   MCHC 33.6 07/28/2018 1302   MCHC 34.0 10/28/2015 1931   RDW 12.0 (L) 07/28/2018 1302   LYMPHSABS 1.9 07/28/2018 1302   MONOABS 0.6 05/04/2015 0250   EOSABS 0.4 07/28/2018 1302   BASOSABS 0.0 07/28/2018 1302    Iron/TIBC/Ferritin/ %Sat No results found for: IRON, TIBC, FERRITIN, IRONPCTSAT Lipid Panel     Component Value Date/Time   CHOL 163 05/04/2019 1122   TRIG 114 05/04/2019 1122   HDL 51 05/04/2019 1122   CHOLHDL 3.6 07/28/2018 1302   LDLCALC 92 05/04/2019 1122   Hepatic Function Panel     Component Value Date/Time   PROT 6.8 05/04/2019 1122   ALBUMIN 4.4 05/04/2019 1122   AST 12 05/04/2019 1122   ALT 16 05/04/2019 1122   ALKPHOS 45 05/04/2019 1122   BILITOT 0.4 05/04/2019 1122      Component Value Date/Time   TSH 2.450 07/28/2018 1302   TSH 1.850 12/19/2016 1057   TSH 1.880 03/15/2015 0924     Ref. Range 01/29/2019 08:36  Vitamin D, 25-Hydroxy Latest Ref Range: 30.0 - 100.0 ng/mL 63.6    OBESITY BEHAVIORAL INTERVENTION VISIT  Today's visit was # 18   Starting weight: 250 lbs Starting date: 09/08/2018 Today's weight :  226 lbs Today's date: 07/06/2019 Total lbs lost to date: 24    07/06/2019  Height 5\' 4"  (1.626 m)  Weight 226 lb (102.5 kg)  BMI (Calculated) 38.77  BLOOD PRESSURE - SYSTOLIC [id number]  BLOOD PRESSURE - DIASTOLIC 72   Body Fat % [id number] %  Total Body Water (lbs) 82.2 lbs    ASK: We discussed the diagnosis of obesity with [name] today and Alberto agreed to give [name] permission to discuss obesity behavioral modification therapy today.  ASSESS: Magdline has the diagnosis of obesity and her BMI today is 38.77 Chinda is in the action stage of change   ADVISE: Ninon was educated on the multiple health risks of obesity as well as the benefit of weight loss to improve her health. She was advised of the need for long term treatment and the importance of lifestyle modifications to improve her current health  and to decrease her risk of future health problems.  AGREE: Multiple dietary modification options and treatment options were discussed and  Sharlet agreed to follow the recommendations documented in the above note.  ARRANGE: Carrington was educated on the importance of  frequent visits to treat obesity as outlined per CMS and USPSTF guidelines and agreed to schedule her next follow up appointment today.  I, Doreene Nest, am acting as transcriptionist for [employer], FNP-C  I have reviewed the above documentation for accuracy and completeness, and I agree with the above.  - Tyrel Lex, FNP-C.

## 2019-07-08 ENCOUNTER — Encounter (INDEPENDENT_AMBULATORY_CARE_PROVIDER_SITE_OTHER): Payer: Self-pay | Admitting: Family Medicine

## 2019-07-13 MED FILL — LISINOPRIL-HCTZ 20-12.5 MG: 20-12.5 | 90 days supply | Qty: 90 | Fill #3

## 2019-07-20 ENCOUNTER — Ambulatory Visit (INDEPENDENT_AMBULATORY_CARE_PROVIDER_SITE_OTHER): Payer: 59 | Admitting: Family Medicine

## 2019-07-20 ENCOUNTER — Encounter (INDEPENDENT_AMBULATORY_CARE_PROVIDER_SITE_OTHER): Payer: Self-pay | Admitting: Family Medicine

## 2019-07-20 ENCOUNTER — Other Ambulatory Visit: Payer: Self-pay

## 2019-07-20 VITALS — BP 115/72 | HR 81 | Temp 97.9°F | Ht 64.0 in | Wt 228.0 lb

## 2019-07-20 DIAGNOSIS — E8881 Metabolic syndrome: Secondary | ICD-10-CM | POA: Diagnosis not present

## 2019-07-20 DIAGNOSIS — E876 Hypokalemia: Secondary | ICD-10-CM | POA: Diagnosis not present

## 2019-07-20 DIAGNOSIS — Z9189 Other specified personal risk factors, not elsewhere classified: Secondary | ICD-10-CM

## 2019-07-20 DIAGNOSIS — Z6839 Body mass index (BMI) 39.0-39.9, adult: Secondary | ICD-10-CM | POA: Diagnosis not present

## 2019-07-21 LAB — COMPREHENSIVE METABOLIC PANEL
ALT: 16 IU/L (ref 0–32)
AST: 9 IU/L (ref 0–40)
Albumin/Globulin Ratio: 1.8 (ref 1.2–2.2)
Albumin: 4.2 g/dL (ref 3.8–4.8)
Alkaline Phosphatase: 47 IU/L (ref 39–117)
BUN/Creatinine Ratio: 21 (ref 9–23)
BUN: 19 mg/dL (ref 6–24)
Bilirubin Total: 0.2 mg/dL (ref 0.0–1.2)
CO2: 18 mmol/L — ABNORMAL LOW (ref 20–29)
Calcium: 9.3 mg/dL (ref 8.7–10.2)
Chloride: 112 mmol/L — ABNORMAL HIGH (ref 96–106)
Creatinine, Ser: 0.9 mg/dL (ref 0.57–1.00)
GFR calc Af Amer: 89 mL/min/{1.73_m2} (ref >59)
GFR calc non Af Amer: 77 mL/min/{1.73_m2} (ref >59)
Globulin, Total: 2.4 g/dL (ref 1.5–4.5)
Glucose: 86 mg/dL (ref 65–99)
Potassium: 3.7 mmol/L (ref 3.5–5.2)
Sodium: 141 mmol/L (ref 134–144)
Total Protein: 6.6 g/dL (ref 6.0–8.5)

## 2019-07-21 NOTE — Progress Notes (Signed)
Office: [phone]  /  Fax: [phone]   HPI:   Chief Complaint: OBESITY [name] is here to discuss her progress with her obesity treatment plan. [name] is on the Category 3 plan and is following her eating plan approximately 75 % of the time. [name] states [name] is exercising at they gym and walking 60 minutes, 3 to 4 times per week. [name] is not sleeping well because her dog has been having seizures and [name] has been keeping an eye on her. [name] is eating more due to stress and [name] craves salty foods, such as [place] and [place]. Her weight is 228 lb (103.4 kg) today and has had a weight gain of 2 pounds over a period of 2 weeks since her last visit. [name] has lost 22 lbs since starting treatment with [name].  Insulin Resistance [name] has a diagnosis of insulin resistance based on her elevated fasting insulin level >5. Marland Kitchen Her appetite is decreased currently due to stress. [name] is eating increased carbohydrates. [name] is not taking medications currently and [name] continues to work on diet and exercise to decrease risk of diabetes.  At risk for diabetes [name] is at higher than average risk for developing diabetes due to her obesity and insulin resistance. [name] currently denies polyuria or polydipsia.  Hypokalemia [name] has a diagnosis of hypokalemia. [name] reports  occasional palpitations. This has happened in the past with low potassium. [name] is not currently on oral potassium.  ASSESSMENT AND PLAN:  Insulin resistance - Plan: Comprehensive Metabolic Panel (CMET)  Hypokalemia  At risk for diabetes mellitus  Class 2 severe obesity with serious comorbidity and body mass index (BMI) of 39.0 to 39.9 in adult, unspecified obesity type ([place])  PLAN:  Insulin Resistance [name] will continue to work on weight loss, exercise, and decreasing simple carbohydrates in her diet to help decrease the risk of diabetes. [name] will continue with the meal plan and [name] will follow up with [name] as directed to monitor her progress.   Diabetes risk counseling [name] was given extended (15 minutes) diabetes prevention counseling today. [name] is 48 y.o. female and has risk factors for diabetes including obesity and insulin resistance. We discussed intensive lifestyle modifications today with an emphasis on weight loss as well as increasing exercise and decreasing simple carbohydrates in her diet.  Hypokalemia We will check CMET today and [name] will follow up with our clinic at the agreed upon time.  Obesity [name] is currently in the action stage of change. As such, her goal is to continue with weight loss efforts [name] has agreed to follow the Category 3 plan [name] will continue her exercise regimen for weight loss and overall health benefits. We discussed the following Behavioral Modification Strategies today: planning for success, no skipping meals, increasing lean protein intake, decreasing simple carbohydrates , decrease eating out, work on meal planning and easy cooking plans and dealing with family or coworker sabotage  [name] will try to maximize protein. [name] hopes to do better on the plan, as her dog improves.  [name] has agreed to follow up with our clinic in 3 weeks. [name] was informed of the importance of frequent follow up visits to maximize her success with intensive lifestyle modifications for her multiple health conditions.  ALLERGIES: Allergies  Allergen Reactions  . Codeine Other (See Comments)    "whole body turned bright red, swelled a little" with V codiene  . Lyrica [Pregabalin] Other (See Comments)    "suicidal thoughts"  . Nyquil Multi-Symptom [Pseudoeph-Doxylamine-Dm-Apap] Other (  See Comments)    Turns skin red *all over body*, pt reports getting very hot   . Sulfa Antibiotics Other (See Comments)    "body turned red"    MEDICATIONS: Current Outpatient Medications on File Prior to Visit  Medication Sig Dispense Refill  . acetaZOLAMIDE (DIAMOX) 250 MG tablet Take 2 tablets (500 mg total) by mouth 2 (two)  times daily. 360 tablet 3  . albuterol (PROVENTIL HFA;VENTOLIN HFA) 108 (90 Base) MCG/ACT inhaler Inhale 2 puffs into the lungs every 4 (four) hours as needed for wheezing or shortness of breath. 3 Inhaler 3  . Calcium Carbonate-Vitamin D (CALCIUM + D PO) Take 1 tablet by mouth daily. Calcium 600 mg, vit D3 300 mg    . cetirizine (ZYRTEC) 5 MG tablet Take 5 mg by mouth daily.    . cholecalciferol (VITAMIN D3) 25 MCG (1000 UT) tablet Take 2,000 Units by mouth daily.     . Cyanocobalamin (VITAMIN B 12 PO) Take 5,000 mcg by mouth daily.     . Echinacea 125 MG CAPS Take 125 mg by mouth daily.     Marland Kitchen lisinopril-hydrochlorothiazide (PRINZIDE,ZESTORETIC) 20-12.5 MG tablet Take 1 tablet by mouth daily. 90 tablet 3  . Magnesium 250 MG TABS Take 250 mg by mouth daily.     Marnee Spring Omega-3 Krill Oil 500 MG CAPS Take 1 capsule by mouth daily.    . metroNIDAZOLE (METROCREAM) 0.75 % cream Apply topically 2 (two) times daily. 45 g 1  . montelukast (SINGULAIR) 10 MG tablet Take 1 tablet (10 mg total) by mouth at bedtime. 90 tablet 3  . Multiple Vitamin (MULTIVITAMIN) capsule Take 1 capsule by mouth daily.    . Oxymetazoline HCl (NASAL SPRAY NA) Place 2 sprays into the nose as needed (CONGESTION).     Marland Kitchen potassium chloride (K-DUR,KLOR-CON) 10 MEQ tablet TAKE 1 TABLET BY MOUTH TWICE A DAY (NEED APPT FOR FOLLOW UP AND LAB WORK) 60 tablet 6   No current facility-administered medications on file prior to visit.     PAST MEDICAL HISTORY: Past Medical History:  Diagnosis Date  . Asthma   . Chiari I malformation ([place])   . Chiari malformation 1996   decompression   . High blood pressure   . Hypertension   . Idiopathic intracranial hypertension   . Lactose intolerance   . Pseudotumor cerebri   . Subclavian bypass stenosis ([place]) 11/97   right  . Swallowing difficulty     PAST SURGICAL HISTORY: Past Surgical History:  Procedure Laterality Date  . APPENDECTOMY    . chiari decompression    . GANGLION CYST  EXCISION Right    wrist  . LAPAROSCOPIC APPENDECTOMY N/A 10/28/2015   Procedure: APPENDECTOMY LAPAROSCOPIC;  Surgeon: Armandina Gemma, MD;  Location: [facility];  Service: General;  Laterality: N/A;  . SUBCLAVIAN BYPASS GRAFT Right 07/1996  . WRIST FRACTURE SURGERY Right 02/2007    SOCIAL HISTORY: Social History   Tobacco Use  . Smoking status: Never Smoker  . Smokeless tobacco: Never Used  Substance Use Topics  . Alcohol use: No    Alcohol/week: 0.0 standard drinks  . Drug use: No    FAMILY HISTORY: Family History  Problem Relation Age of Onset  . Cataracts Mother   . Diabetes Mother   . Hypertension Mother   . High Cholesterol Mother   . Obesity Mother   . Diabetes Father   . Hypertension Father   . Obesity Father     ROS: Review of Systems  Constitutional: Negative for weight loss.  Cardiovascular: Positive for palpitations.  Psychiatric/Behavioral:       Positive for stress    PHYSICAL EXAM: Blood pressure 115/72, pulse 81, temperature 97.9 F (36.6 C), temperature source Oral, height 5\' 4"  (1.626 m), weight 228 lb (103.4 kg), last menstrual period 06/19/2019, SpO2 98 %. Body mass index is 39.14 kg/m. Physical Exam Vitals signs reviewed.  Constitutional:      Appearance: Normal appearance. [name] is well-developed. [name] is obese.  Cardiovascular:     Rate and Rhythm: Normal rate.  Pulmonary:     Effort: Pulmonary effort is normal.  Musculoskeletal: Normal range of motion.  Skin:    General: Skin is warm and dry.  Neurological:     Mental Status: [name] is alert and oriented to person, place, and time.  Psychiatric:        Mood and Affect: Mood normal.        Behavior: Behavior normal.     RECENT LABS AND TESTS: BMET    Component Value Date/Time   NA 141 07/20/2019 1450   K 3.7 07/20/2019 1450   CL 112 (H) 07/20/2019 1450   CO2 18 (L) 07/20/2019 1450   GLUCOSE 86 07/20/2019 1450   GLUCOSE 104 (H) 10/28/2015 2043   BUN 19 07/20/2019 1450   CREATININE 0.90  07/20/2019 1450   CALCIUM 9.3 07/20/2019 1450   GFRNONAA 77 07/20/2019 1450   GFRAA 89 07/20/2019 1450   Lab Results  Component Value Date   HGBA1C 5.1 05/04/2019   HGBA1C 5.1 01/29/2019   HGBA1C 5.2 09/08/2018   Lab Results  Component Value Date   INSULIN 13.2 05/04/2019   INSULIN 16.1 01/29/2019   INSULIN 16.9 09/08/2018   CBC    Component Value Date/Time   WBC 7.2 07/28/2018 1302   WBC 15.0 (H) 10/28/2015 1931   RBC 4.59 07/28/2018 1302   RBC 4.37 10/28/2015 1931   HGB 14.2 07/28/2018 1302   HCT 42.3 07/28/2018 1302   PLT 243 07/28/2018 1302   MCV 92 07/28/2018 1302   MCH 30.9 07/28/2018 1302   MCH 30.2 10/28/2015 1931   MCHC 33.6 07/28/2018 1302   MCHC 34.0 10/28/2015 1931   RDW 12.0 (L) 07/28/2018 1302   LYMPHSABS 1.9 07/28/2018 1302   MONOABS 0.6 05/04/2015 0250   EOSABS 0.4 07/28/2018 1302   BASOSABS 0.0 07/28/2018 1302   Iron/TIBC/Ferritin/ %Sat No results found for: IRON, TIBC, FERRITIN, IRONPCTSAT Lipid Panel     Component Value Date/Time   CHOL 163 05/04/2019 1122   TRIG 114 05/04/2019 1122   HDL 51 05/04/2019 1122   CHOLHDL 3.6 07/28/2018 1302   LDLCALC 92 05/04/2019 1122   Hepatic Function Panel     Component Value Date/Time   PROT 6.6 07/20/2019 1450   ALBUMIN 4.2 07/20/2019 1450   AST 9 07/20/2019 1450   ALT 16 07/20/2019 1450   ALKPHOS 47 07/20/2019 1450   BILITOT 0.2 07/20/2019 1450      Component Value Date/Time   TSH 2.450 07/28/2018 1302   TSH 1.850 12/19/2016 1057   TSH 1.880 03/15/2015 0924     Ref. Range 01/29/2019 08:36  Vitamin D, 25-Hydroxy Latest Ref Range: 30.0 - 100.0 ng/mL 63.6   OBESITY BEHAVIORAL INTERVENTION VISIT  Today's visit was # 19   Starting weight: 250 lbs Starting date: 09/08/2018 Today's weight : 228 lbs Today's date: 07/20/2019 Total lbs lost to date: 22    07/20/2019  Height 5\' 4"  (1.626 m)  Weight  228 lb (103.4 kg)  BMI (Calculated) 39.12  BLOOD PRESSURE - SYSTOLIC [id number]  BLOOD PRESSURE -  DIASTOLIC 72   Body Fat % 123XX123 %    ASK: We discussed the diagnosis of obesity with [name] today and [name] agreed to give [name] permission to discuss obesity behavioral modification therapy today.  ASSESS: Karthika has the diagnosis of obesity and her BMI today is 39.12 [name] is in the action stage of change   ADVISE: [name] was educated on the multiple health risks of obesity as well as the benefit of weight loss to improve her health. [name] was advised of the need for long term treatment and the importance of lifestyle modifications to improve her current health and to decrease her risk of future health problems.  AGREE: Multiple dietary modification options and treatment options were discussed and  [name] agreed to follow the recommendations documented in the above note.  ARRANGE: [name] was educated on the importance of frequent visits to treat obesity as outlined per CMS and USPSTF guidelines and agreed to schedule her next follow up appointment today.  Corey Skains, am acting as Location manager for [employer], FNP-C.  I have reviewed the above documentation for accuracy and completeness, and I agree with the above.  - Tonilynn Bieker, FNP-C.

## 2019-07-22 ENCOUNTER — Encounter (INDEPENDENT_AMBULATORY_CARE_PROVIDER_SITE_OTHER): Payer: Self-pay | Admitting: Family Medicine

## 2019-07-31 ENCOUNTER — Encounter: Payer: 59 | Admitting: Family

## 2019-08-03 ENCOUNTER — Other Ambulatory Visit: Payer: Self-pay

## 2019-08-03 MED FILL — acetaZOLAMIDE 250 MG TABS: 250 | 90 days supply | Qty: 360 | Fill #0

## 2019-08-04 ENCOUNTER — Ambulatory Visit (INDEPENDENT_AMBULATORY_CARE_PROVIDER_SITE_OTHER): Payer: 59 | Admitting: Family

## 2019-08-04 ENCOUNTER — Encounter: Payer: Self-pay | Admitting: Family

## 2019-08-04 ENCOUNTER — Other Ambulatory Visit: Payer: Self-pay

## 2019-08-04 VITALS — BP 116/72 | HR 73 | Temp 98.2°F | Ht 64.0 in | Wt 231.0 lb

## 2019-08-04 DIAGNOSIS — I10 Essential (primary) hypertension: Secondary | ICD-10-CM

## 2019-08-04 DIAGNOSIS — Z0000 Encounter for general adult medical examination without abnormal findings: Secondary | ICD-10-CM

## 2019-08-04 DIAGNOSIS — E559 Vitamin D deficiency, unspecified: Secondary | ICD-10-CM

## 2019-08-04 DIAGNOSIS — E669 Obesity, unspecified: Secondary | ICD-10-CM

## 2019-08-04 DIAGNOSIS — Z6839 Body mass index (BMI) 39.0-39.9, adult: Secondary | ICD-10-CM | POA: Diagnosis not present

## 2019-08-04 DIAGNOSIS — J301 Allergic rhinitis due to pollen: Secondary | ICD-10-CM | POA: Diagnosis not present

## 2019-08-04 DIAGNOSIS — J4520 Mild intermittent asthma, uncomplicated: Secondary | ICD-10-CM | POA: Diagnosis not present

## 2019-08-04 DIAGNOSIS — Z0001 Encounter for general adult medical examination with abnormal findings: Secondary | ICD-10-CM | POA: Diagnosis not present

## 2019-08-04 DIAGNOSIS — G932 Benign intracranial hypertension: Secondary | ICD-10-CM

## 2019-08-04 MED ORDER — MONTELUKAST SODIUM 10 MG PO TABS: 10.0000 mg | ORAL_TABLET | Freq: Every day | ORAL | 4 refills | Status: DC

## 2019-08-04 MED ORDER — LISINOPRIL-HYDROCHLOROTHIAZIDE 20-12.5 MG PO TABS: 1.0000 | ORAL_TABLET | Freq: Every day | ORAL | 4 refills | Status: DC

## 2019-08-04 MED ORDER — ALBUTEROL SULFATE HFA 108 (90 BASE) MCG/ACT IN AERS: 2.0000 | INHALATION_SPRAY | RESPIRATORY_TRACT | 2 refills | Status: DC | PRN

## 2019-08-04 MED FILL — ALBUTEROL SULFATE HFA 108 (: 108 (90 BAS | 17 days supply | Qty: 18 | Fill #0

## 2019-08-04 NOTE — Patient Instructions (Signed)
Health Maintenance, Female Adopting a healthy lifestyle and getting preventive care are important in promoting health and wellness. Ask your health care provider about:  The right schedule for you to have regular tests and exams.  Things you can do on your own to prevent diseases and keep yourself healthy. What should I know about diet, weight, and exercise? Eat a healthy diet   Eat a diet that includes plenty of vegetables, fruits, low-fat dairy products, and lean protein.  Do not eat a lot of foods that are high in solid fats, added sugars, or sodium. Maintain a healthy weight Body mass index (BMI) is used to identify weight problems. It estimates body fat based on height and weight. Your health care provider can help determine your BMI and help you achieve or maintain a healthy weight. Get regular exercise Get regular exercise. This is one of the most important things you can do for your health. Most adults should:  Exercise for at least 150 minutes each week. The exercise should increase your heart rate and make you sweat (moderate-intensity exercise).  Do strengthening exercises at least twice a week. This is in addition to the moderate-intensity exercise.  Spend less time sitting. Even light physical activity can be beneficial. Watch cholesterol and blood lipids Have your blood tested for lipids and cholesterol at 46 years of age, then have this test every 5 years. Have your cholesterol levels checked more often if:  Your lipid or cholesterol levels are high.  You are older than 46 years of age.  You are at high risk for heart disease. What should I know about cancer screening? Depending on your health history and family history, you may need to have cancer screening at various ages. This may include screening for:  Breast cancer.  Cervical cancer.  Colorectal cancer.  Skin cancer.  Lung cancer. What should I know about heart disease, diabetes, and high blood  pressure? Blood pressure and heart disease  High blood pressure causes heart disease and increases the risk of stroke. This is more likely to develop in people who have high blood pressure readings, are of African descent, or are overweight.  Have your blood pressure checked: ? Every 3-5 years if you are 18-39 years of age. ? Every year if you are 40 years old or older. Diabetes Have regular diabetes screenings. This checks your fasting blood sugar level. Have the screening done:  Once every three years after age 40 if you are at a normal weight and have a low risk for diabetes.  More often and at a younger age if you are overweight or have a high risk for diabetes. What should I know about preventing infection? Hepatitis B If you have a higher risk for hepatitis B, you should be screened for this virus. Talk with your health care provider to find out if you are at risk for hepatitis B infection. Hepatitis C Testing is recommended for:  Everyone born from 1945 through 1965.  Anyone with known risk factors for hepatitis C. Sexually transmitted infections (STIs)  Get screened for STIs, including gonorrhea and chlamydia, if: ? You are sexually active and are younger than 46 years of age. ? You are older than 46 years of age and your health care provider tells you that you are at risk for this type of infection. ? Your sexual activity has changed since you were last screened, and you are at increased risk for chlamydia or gonorrhea. Ask your health care provider if   you are at risk.  Ask your health care provider about whether you are at high risk for HIV. Your health care provider may recommend a prescription medicine to help prevent HIV infection. If you choose to take medicine to prevent HIV, you should first get tested for HIV. You should then be tested every 3 months for as long as you are taking the medicine. Pregnancy  If you are about to stop having your period (premenopausal) and  you may become pregnant, seek counseling before you get pregnant.  Take 400 to 800 micrograms (mcg) of folic acid every day if you become pregnant.  Ask for birth control (contraception) if you want to prevent pregnancy. Osteoporosis and menopause Osteoporosis is a disease in which the bones lose minerals and strength with aging. This can result in bone fractures. If you are 65 years old or older, or if you are at risk for osteoporosis and fractures, ask your health care provider if you should:  Be screened for bone loss.  Take a calcium or vitamin D supplement to lower your risk of fractures.  Be given hormone replacement therapy (HRT) to treat symptoms of menopause. Follow these instructions at home: Lifestyle  Do not use any products that contain nicotine or tobacco, such as cigarettes, e-cigarettes, and chewing tobacco. If you need help quitting, ask your health care provider.  Do not use street drugs.  Do not share needles.  Ask your health care provider for help if you need support or information about quitting drugs. Alcohol use  Do not drink alcohol if: ? Your health care provider tells you not to drink. ? You are pregnant, may be pregnant, or are planning to become pregnant.  If you drink alcohol: ? Limit how much you use to 0-1 drink a day. ? Limit intake if you are breastfeeding.  Be aware of how much alcohol is in your drink. In the [place] one drink equals one 12 oz bottle of beer (355 mL), one 5 oz glass of wine (148 mL), or one 1 oz glass of hard liquor (44 mL). General instructions  Schedule regular health, dental, and eye exams.  Stay current with your vaccines.  Tell your health care provider if: ? You often feel depressed. ? You have ever been abused or do not feel safe at home. Summary  Adopting a healthy lifestyle and getting preventive care are important in promoting health and wellness.  Follow your health care provider's instructions about healthy  diet, exercising, and getting tested or screened for diseases.  Follow your health care provider's instructions on monitoring your cholesterol and blood pressure. This information is not intended to replace advice given to you by your health care provider. Make sure you discuss any questions you have with your health care provider. Document Released: 03/05/2011 Document Revised: 08/13/2018 Document Reviewed: 08/13/2018 Elsevier Patient Education  2020 Elsevier Inc.  

## 2019-08-04 NOTE — Progress Notes (Signed)
Subjective:    Patient ID: [name], female    DOB: [birth year], 48 y.o.   MRN: [id number]  Chief Complaint  Patient presents with  . Annual Exam   Pt presents to the office today for CPE without pap. Pt had Pap 12/2016 that was normal with negative HPV. .Pt has idiopathic intracranial hypertension and is followed by Dr. Leta Mills, neurologists annually. She is followed by Weight management ever 2 weeks. She states she has lost 32 lbs since starting there.  Hypertension This is a chronic problem. The current episode started more than 1 year ago. The problem has been resolved since onset. The problem is controlled. Associated symptoms include malaise/fatigue. Pertinent negatives include no chest pain, headaches, peripheral edema or shortness of breath. Risk factors for coronary artery disease include dyslipidemia, obesity and sedentary lifestyle. The current treatment provides moderate improvement. There is no history of kidney disease or heart failure.  Asthma There is no cough, hemoptysis, shortness of breath or wheezing. This is a chronic problem. The current episode started more than 1 year ago. The problem occurs intermittently. The problem has been waxing and waning. Associated symptoms include malaise/fatigue. Pertinent negatives include no chest pain, ear congestion, headaches, nasal congestion or rhinorrhea. She reports significant improvement on treatment. Her past medical history is significant for asthma.      Review of Systems  Constitutional: Positive for malaise/fatigue.  HENT: Negative for rhinorrhea.   Respiratory: Negative for cough, hemoptysis, shortness of breath and wheezing.   Cardiovascular: Negative for chest pain.  Neurological: Negative for headaches.  All other systems reviewed and are negative.  Family History  Problem Relation Age of Onset  . Cataracts Mother   . Diabetes Mother   . Hypertension Mother   . High Cholesterol Mother   . Obesity Mother    . Diabetes Father   . Hypertension Father   . Obesity Father    Social History   Socioeconomic History  . Marital status: Married    Spouse name: [name]  . Number of children: 0  . Years of education: 18  . Highest education level: Not on file  Occupational History  . Occupation: CT Engineer, production: [employer]  Social Needs  . Financial resource strain: Not on file  . Food insecurity    Worry: Not on file    Inability: Not on file  . Transportation needs    Medical: Not on file    Non-medical: Not on file  Tobacco Use  . Smoking status: Never Smoker  . Smokeless tobacco: Never Used  Substance and Sexual Activity  . Alcohol use: No    Alcohol/week: 0.0 standard drinks  . Drug use: No  . Sexual activity: Not on file  Lifestyle  . Physical activity    Days per week: Not on file    Minutes per session: Not on file  . Stress: Not on file  Relationships  . Social Herbalist on phone: Not on file    Gets together: Not on file    Attends religious service: Not on file    Active member of club or organization: Not on file    Attends meetings of clubs or organizations: Not on file    Relationship status: Not on file  Other Topics Concern  . Not on file  Social History Narrative   Married, no children, lives at home   Caffeine use- 2-3 cups coffee 5 days a week  Objective:   Physical Exam Vitals signs reviewed.  Constitutional:      General: She is not in acute distress.    Appearance: She is well-developed.  HENT:     Head: Normocephalic and atraumatic.     Right Ear: Tympanic membrane normal.     Left Ear: Tympanic membrane normal.  Eyes:     Pupils: Pupils are equal, round, and reactive to light.  Neck:     Musculoskeletal: Normal range of motion and neck supple.     Thyroid: No thyromegaly.  Cardiovascular:     Rate and Rhythm: Normal rate and regular rhythm.     Heart sounds: Normal heart sounds. No murmur.  Pulmonary:      Effort: Pulmonary effort is normal. No respiratory distress.     Breath sounds: Normal breath sounds. No wheezing.  Abdominal:     General: Bowel sounds are normal. There is no distension.     Palpations: Abdomen is soft.     Tenderness: There is no abdominal tenderness.  Musculoskeletal: Normal range of motion.        General: No tenderness.  Skin:    General: Skin is warm and dry.  Neurological:     Mental Status: She is alert and oriented to person, place, and time.     Cranial Nerves: No cranial nerve deficit.     Deep Tendon Reflexes: Reflexes are normal and symmetric.  Psychiatric:        Behavior: Behavior normal.        Thought Content: Thought content normal.        Judgment: Judgment normal.       BP 116/72   Pulse 73   Temp 98.2 F (36.8 C) (Temporal)   Ht 5' 4" (1.626 m)   Wt 231 lb (104.8 kg)   SpO2 99%   BMI 39.65 kg/m   Assessment & Plan:  [name] comes in today with chief complaint of Annual Exam   Diagnosis and orders addressed:  1. Essential hypertension - CMP14+EGFR - CBC with Differential/Platelet - lisinopril-hydrochlorothiazide (ZESTORETIC) 20-12.5 MG tablet; Take 1 tablet by mouth daily.  Dispense: 90 tablet; Refill: 4  2. Mild intermittent asthma without complication - CMP14+EGFR - CBC with Differential/Platelet - montelukast (SINGULAIR) 10 MG tablet; Take 1 tablet (10 mg total) by mouth at bedtime.  Dispense: 90 tablet; Refill: 4  3. Vitamin D deficiency - CMP14+EGFR - CBC with Differential/Platelet - Vitamin D 25 hydroxy  4. Class 2 severe obesity with serious comorbidity and body mass index (BMI) of 39.0 to 39.9 in adult, unspecified obesity type (HCC) - CMP14+EGFR - CBC with Differential/Platelet  5. Obesity (BMI 30-39.9) - CMP14+EGFR - CBC with Differential/Platelet  6. Idiopathic intracranial hypertension - CMP14+EGFR - CBC with Differential/Platelet - lisinopril-hydrochlorothiazide (ZESTORETIC) 20-12.5 MG tablet;  Take 1 tablet by mouth daily.  Dispense: 90 tablet; Refill: 4  7. Annual physical exam - CMP14+EGFR - CBC with Differential/Platelet - Lipid panel - TSH - Vitamin D 25 hydroxy  8. Allergic rhinitis due to pollen, unspecified seasonality - montelukast (SINGULAIR) 10 MG tablet; Take 1 tablet (10 mg total) by mouth at bedtime.  Dispense: 90 tablet; Refill: 4 - albuterol (VENTOLIN HFA) 108 (90 Base) MCG/ACT inhaler; Inhale 2 puffs into the lungs every 4 (four) hours as needed for wheezing or shortness of breath.  Dispense: 18 g; Refill: 2   Labs pending Health Maintenance reviewed Diet and exercise encouraged  Follow up plan: 1 year  Christy Hawks,   FNP

## 2019-08-05 LAB — CBC WITH DIFFERENTIAL/PLATELET
Basophils Absolute: 0 10*3/uL (ref 0.0–0.2)
Basos: 1 %
EOS (ABSOLUTE): 0.3 10*3/uL (ref 0.0–0.4)
Eos: 4 %
Hematocrit: 43.5 % (ref 34.0–46.6)
Hemoglobin: 14.5 g/dL (ref 11.1–15.9)
Immature Grans (Abs): 0 10*3/uL (ref 0.0–0.1)
Immature Granulocytes: 0 %
Lymphocytes Absolute: 2.6 10*3/uL (ref 0.7–3.1)
Lymphs: 30 %
MCH: 30 pg (ref 26.6–33.0)
MCHC: 33.3 g/dL (ref 31.5–35.7)
MCV: 90 fL (ref 79–97)
Monocytes Absolute: 0.6 10*3/uL (ref 0.1–0.9)
Monocytes: 7 %
Neutrophils Absolute: 4.9 10*3/uL (ref 1.4–7.0)
Neutrophils: 58 %
Platelets: 212 10*3/uL (ref 150–450)
RBC: 4.83 x10E6/uL (ref 3.77–5.28)
RDW: 12.4 % (ref 11.7–15.4)
WBC: 8.5 10*3/uL (ref 3.4–10.8)

## 2019-08-05 LAB — CMP14+EGFR
ALT: 15 IU/L (ref 0–32)
AST: 11 IU/L (ref 0–40)
Albumin/Globulin Ratio: 1.8 (ref 1.2–2.2)
Albumin: 4.5 g/dL (ref 3.8–4.8)
Alkaline Phosphatase: 46 IU/L (ref 39–117)
BUN/Creatinine Ratio: 23 (ref 9–23)
BUN: 19 mg/dL (ref 6–24)
Bilirubin Total: 0.4 mg/dL (ref 0.0–1.2)
CO2: 20 mmol/L (ref 20–29)
Calcium: 9.7 mg/dL (ref 8.7–10.2)
Chloride: 105 mmol/L (ref 96–106)
Creatinine, Ser: 0.84 mg/dL (ref 0.57–1.00)
GFR calc Af Amer: 96 mL/min/{1.73_m2} (ref >59)
GFR calc non Af Amer: 84 mL/min/{1.73_m2} (ref >59)
Globulin, Total: 2.5 g/dL (ref 1.5–4.5)
Glucose: 88 mg/dL (ref 65–99)
Potassium: 3.4 mmol/L — ABNORMAL LOW (ref 3.5–5.2)
Sodium: 141 mmol/L (ref 134–144)
Total Protein: 7 g/dL (ref 6.0–8.5)

## 2019-08-05 LAB — LIPID PANEL
Chol/HDL Ratio: 3 ratio (ref 0.0–4.4)
Cholesterol, Total: 167 mg/dL (ref 100–199)
HDL: 56 mg/dL (ref >39)
LDL Chol Calc (NIH): 93 mg/dL (ref 0–99)
Triglycerides: 98 mg/dL (ref 0–149)
VLDL Cholesterol Cal: 18 mg/dL (ref 5–40)

## 2019-08-05 LAB — VITAMIN D 25 HYDROXY (VIT D DEFICIENCY, FRACTURES): Vit D, 25-Hydroxy: 33 ng/mL (ref 30.0–100.0)

## 2019-08-05 LAB — TSH: TSH: 2.62 u[IU]/mL (ref 0.450–4.500)

## 2019-08-06 ENCOUNTER — Other Ambulatory Visit: Payer: Self-pay | Admitting: Family

## 2019-08-06 MED ORDER — POTASSIUM CHLORIDE ER 10 MEQ PO TBCR: 10.0000 meq | EXTENDED_RELEASE_TABLET | Freq: Every day | ORAL | 1 refills | Status: DC

## 2019-08-06 MED FILL — POTASSIUM CHL ER M10 TABLET: 10 | 90 days supply | Qty: 90 | Fill #0

## 2019-08-10 ENCOUNTER — Encounter (INDEPENDENT_AMBULATORY_CARE_PROVIDER_SITE_OTHER): Payer: Self-pay | Admitting: Family Medicine

## 2019-08-10 ENCOUNTER — Ambulatory Visit (INDEPENDENT_AMBULATORY_CARE_PROVIDER_SITE_OTHER): Payer: 59 | Admitting: Family Medicine

## 2019-08-10 ENCOUNTER — Other Ambulatory Visit: Payer: Self-pay

## 2019-08-10 VITALS — BP 101/67 | HR 80 | Temp 98.1°F | Ht 64.0 in | Wt 224.0 lb

## 2019-08-10 DIAGNOSIS — E8881 Metabolic syndrome: Secondary | ICD-10-CM

## 2019-08-10 DIAGNOSIS — Z6838 Body mass index (BMI) 38.0-38.9, adult: Secondary | ICD-10-CM | POA: Diagnosis not present

## 2019-08-11 NOTE — Progress Notes (Signed)
Office: [phone]  /  Fax: [phone]   HPI:   Chief Complaint: OBESITY [name] is here to discuss her progress with her obesity treatment plan. She is on the Category 3 plan and is following her eating plan approximately 85 % of the time. She states she is walking 120 minutes 2 times per week. [name] is having a protein bar at night when she does not get a meal break at work. 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 She reports eating out less. She is eating all of the prescribed food with the exception of missed meals at work. Her weight is 224 lb (101.6 kg) today and she has had a weight loss of 4 pounds over a period of 3 weeks since her last visit. She has lost 26 lbs since starting treatment with [name].  Insulin Resistance [name] has a diagnosis of insulin resistance based on her elevated fasting insulin level >5.  She is not on metformin. She continues to work on diet and exercise to decrease risk of diabetes. [name] denies polyphagia. Lab Results  Component Value Date   HGBA1C 5.1 05/04/2019    ASSESSMENT AND PLAN:  Insulin resistance  Class 2 severe obesity with serious comorbidity and body mass index (BMI) of 38.0 to 38.9 in adult, unspecified obesity type ([place])  PLAN:  Insulin Resistance [name] will continue to work on weight loss, exercise, and decreasing simple carbohydrates to help decrease the risk of diabetes. [name] will continue with the meal plan and she agreed to follow up with [name] as directed to closely monitor her progress.  Obesity [name] is currently in the action stage of change. As such, her goal is to continue with weight loss efforts She has agreed to follow the Category 3 plan [name] will continue walking for 120 minuets, 2 times per week for weight loss and overall health benefits. We discussed the following Behavioral Modification Strategies today: planning for success and holiday eating strategies   Handout for holiday tips was given to patient today.  [name] has  agreed to follow up with our clinic in 2 weeks. She was informed of the importance of frequent follow up visits to maximize her success with intensive lifestyle modifications for her multiple health conditions.  ALLERGIES: Allergies  Allergen Reactions   Codeine Other (See Comments)    "whole body turned bright red, swelled a little" with V codiene   Lyrica [Pregabalin] Other (See Comments)    "suicidal thoughts"   Nyquil Multi-Symptom [Pseudoeph-Doxylamine-Dm-Apap] Other (See Comments)    Turns skin red *all over body*, pt reports getting very hot    Sulfa Antibiotics Other (See Comments)    "body turned red"    MEDICATIONS: Current Outpatient Medications on File Prior to Visit  Medication Sig Dispense Refill   acetaZOLAMIDE (DIAMOX) 250 MG tablet Take 2 tablets (500 mg total) by mouth 2 (two) times daily. 360 tablet 3   albuterol (VENTOLIN HFA) 108 (90 Base) MCG/ACT inhaler Inhale 2 puffs into the lungs every 4 (four) hours as needed for wheezing or shortness of breath. 18 g 2   Calcium Carbonate-Vitamin D (CALCIUM + D PO) Take 1 tablet by mouth daily. Calcium 600 mg, vit D3 300 mg     cetirizine (ZYRTEC) 5 MG tablet Take 5 mg by mouth daily.     cholecalciferol (VITAMIN D3) 25 MCG (1000 UT) tablet Take 2,000 Units by mouth daily.      Cyanocobalamin (VITAMIN B 12 PO) Take 5,000 mcg by  mouth daily.      Echinacea 125 MG CAPS Take 125 mg by mouth daily.      lisinopril-hydrochlorothiazide (ZESTORETIC) 20-12.5 MG tablet Take 1 tablet by mouth daily. 90 tablet 4   Magnesium 250 MG TABS Take 250 mg by mouth daily.      MegaRed Omega-3 Krill Oil 500 MG CAPS Take 1 capsule by mouth daily.     metroNIDAZOLE (METROCREAM) 0.75 % cream Apply topically 2 (two) times daily. 45 g 1   montelukast (SINGULAIR) 10 MG tablet Take 1 tablet (10 mg total) by mouth at bedtime. 90 tablet 4   Multiple Vitamin (MULTIVITAMIN) capsule Take 1 capsule by mouth daily.     Oxymetazoline HCl  (NASAL SPRAY NA) Place 2 sprays into the nose as needed (CONGESTION).      potassium chloride (KLOR-CON) 10 MEQ tablet Take 1 tablet (10 mEq total) by mouth daily. 90 tablet 1   No current facility-administered medications on file prior to visit.     PAST MEDICAL HISTORY: Past Medical History:  Diagnosis Date   Asthma    Chiari [name] malformation ([place])    Chiari malformation 1996   decompression    High blood pressure    Hypertension    Idiopathic intracranial hypertension    Lactose intolerance    Pseudotumor cerebri    Subclavian bypass stenosis ([place]) 11/97   right   Swallowing difficulty     PAST SURGICAL HISTORY: Past Surgical History:  Procedure Laterality Date   APPENDECTOMY     chiari decompression     GANGLION CYST EXCISION Right    wrist   LAPAROSCOPIC APPENDECTOMY N/A 10/28/2015   Procedure: APPENDECTOMY LAPAROSCOPIC;  Surgeon: Armandina Gemma, MD;  Location: [facility];  Service: General;  Laterality: N/A;   SUBCLAVIAN BYPASS GRAFT Right 07/1996   WRIST FRACTURE SURGERY Right 02/2007    SOCIAL HISTORY: Social History   Tobacco Use   Smoking status: Never Smoker   Smokeless tobacco: Never Used  Substance Use Topics   Alcohol use: No    Alcohol/week: 0.0 standard drinks   Drug use: No    FAMILY HISTORY: Family History  Problem Relation Age of Onset   Cataracts Mother    Diabetes Mother    Hypertension Mother    High Cholesterol Mother    Obesity Mother    Diabetes Father    Hypertension Father    Obesity Father     ROS: Review of Systems  Constitutional: Positive for weight loss.  Endo/Heme/Allergies:       Negative for polyphagia    PHYSICAL EXAM: Blood pressure 101/67, pulse 80, temperature 98.1 F (36.7 C), temperature source Oral, height 5\' 4"  (1.626 m), weight 224 lb (101.6 kg), last menstrual period 07/20/2019, SpO2 97 %. Body mass index is 38.45 kg/m. Physical Exam Vitals signs reviewed.  Constitutional:       Appearance: Normal appearance. She is well-developed. She is obese.  Cardiovascular:     Rate and Rhythm: Normal rate.  Pulmonary:     Effort: Pulmonary effort is normal.  Musculoskeletal: Normal range of motion.  Skin:    General: Skin is warm and dry.  Neurological:     Mental Status: She is alert and oriented to person, place, and time.  Psychiatric:        Mood and Affect: Mood normal.        Behavior: Behavior normal.     RECENT LABS AND TESTS: BMET    Component Value Date/Time  NA 141 08/04/2019 1527   K 3.4 (L) 08/04/2019 1527   CL 105 08/04/2019 1527   CO2 20 08/04/2019 1527   GLUCOSE 88 08/04/2019 1527   GLUCOSE 104 (H) 10/28/2015 2043   BUN 19 08/04/2019 1527   CREATININE 0.84 08/04/2019 1527   CALCIUM 9.7 08/04/2019 1527   GFRNONAA 84 08/04/2019 1527   GFRAA 96 08/04/2019 1527   Lab Results  Component Value Date   HGBA1C 5.1 05/04/2019   HGBA1C 5.1 01/29/2019   HGBA1C 5.2 09/08/2018   Lab Results  Component Value Date   INSULIN 13.2 05/04/2019   INSULIN 16.1 01/29/2019   INSULIN 16.9 09/08/2018   CBC    Component Value Date/Time   WBC 8.5 08/04/2019 1527   WBC 15.0 (H) 10/28/2015 1931   RBC 4.83 08/04/2019 1527   RBC 4.37 10/28/2015 1931   HGB 14.5 08/04/2019 1527   HCT 43.5 08/04/2019 1527   PLT 212 08/04/2019 1527   MCV 90 08/04/2019 1527   MCH 30.0 08/04/2019 1527   MCH 30.2 10/28/2015 1931   MCHC 33.3 08/04/2019 1527   MCHC 34.0 10/28/2015 1931   RDW 12.4 08/04/2019 1527   LYMPHSABS 2.6 08/04/2019 1527   MONOABS 0.6 05/04/2015 0250   EOSABS 0.3 08/04/2019 1527   BASOSABS 0.0 08/04/2019 1527   Iron/TIBC/Ferritin/ %Sat No results found for: IRON, TIBC, FERRITIN, IRONPCTSAT Lipid Panel     Component Value Date/Time   CHOL 167 08/04/2019 1527   TRIG 98 08/04/2019 1527   HDL 56 08/04/2019 1527   CHOLHDL 3.0 08/04/2019 1527   LDLCALC 93 08/04/2019 1527   Hepatic Function Panel     Component Value Date/Time   PROT 7.0 08/04/2019  1527   ALBUMIN 4.5 08/04/2019 1527   AST 11 08/04/2019 1527   ALT 15 08/04/2019 1527   ALKPHOS 46 08/04/2019 1527   BILITOT 0.4 08/04/2019 1527      Component Value Date/Time   TSH 2.620 08/04/2019 1527   TSH 2.450 07/28/2018 1302   TSH 1.850 12/19/2016 1057     Ref. Range 08/04/2019 15:27  Vitamin D, 25-Hydroxy Latest Ref Range: 30.0 - 100.0 ng/mL 33.0    OBESITY BEHAVIORAL INTERVENTION VISIT  Today's visit was # 20   Starting weight: 250 lbs Starting date: 09/08/2018 Today's weight : 224 lbs Today's date: 08/10/2019 Total lbs lost to date: 26    08/10/2019  Height 5\' 4"  (1.626 m)  Weight 224 lb (101.6 kg)  BMI (Calculated) 38.43  BLOOD PRESSURE - SYSTOLIC 99991111  BLOOD PRESSURE - DIASTOLIC 67   Body Fat % 51 %  Total Body Water (lbs) 80.4 lbs    ASK: We discussed the diagnosis of obesity with [name] today and Artasia agreed to give [name] permission to discuss obesity behavioral modification therapy today.  ASSESS: [name] has the diagnosis of obesity and her BMI today is 38.43 [name] is in the action stage of change   ADVISE: [name] was educated on the multiple health risks of obesity as well as the benefit of weight loss to improve her health. She was advised of the need for long term treatment and the importance of lifestyle modifications to improve her current health and to decrease her risk of future health problems.  AGREE: Multiple dietary modification options and treatment options were discussed and  [name] agreed to follow the recommendations documented in the above note.  ARRANGE: Ismay was educated on the importance of frequent visits to treat obesity as outlined per CMS  and USPSTF guidelines and agreed to schedule her next follow up appointment today.  [name], [name], am acting as transcriptionist for [employer], FNP-C  [name] have reviewed the above documentation for accuracy and completeness, and [name] agree with the above.  - Kylie Velazquez, FNP-C.

## 2019-08-12 ENCOUNTER — Encounter (INDEPENDENT_AMBULATORY_CARE_PROVIDER_SITE_OTHER): Payer: Self-pay | Admitting: Family Medicine

## 2019-08-20 ENCOUNTER — Telehealth: Payer: Self-pay | Admitting: Family

## 2019-08-24 ENCOUNTER — Other Ambulatory Visit: Payer: Self-pay

## 2019-08-24 ENCOUNTER — Encounter (INDEPENDENT_AMBULATORY_CARE_PROVIDER_SITE_OTHER): Payer: Self-pay | Admitting: Bariatrics

## 2019-08-24 ENCOUNTER — Ambulatory Visit (INDEPENDENT_AMBULATORY_CARE_PROVIDER_SITE_OTHER): Payer: 59 | Admitting: Bariatrics

## 2019-08-24 VITALS — BP 118/74 | HR 78 | Temp 98.0°F | Ht 64.0 in | Wt 227.0 lb

## 2019-08-24 DIAGNOSIS — G932 Benign intracranial hypertension: Secondary | ICD-10-CM

## 2019-08-24 DIAGNOSIS — E669 Obesity, unspecified: Secondary | ICD-10-CM

## 2019-08-24 DIAGNOSIS — Z6830 Body mass index (BMI) 30.0-30.9, adult: Secondary | ICD-10-CM

## 2019-08-24 DIAGNOSIS — I10 Essential (primary) hypertension: Secondary | ICD-10-CM

## 2019-08-25 ENCOUNTER — Encounter (INDEPENDENT_AMBULATORY_CARE_PROVIDER_SITE_OTHER): Payer: Self-pay | Admitting: Bariatrics

## 2019-08-25 NOTE — Progress Notes (Signed)
Office: [phone]  /  Fax: [phone]   HPI:  Chief Complaint: OBESITY [name] is here to discuss her progress with her obesity treatment plan. She is on the Category 3 plan and states she is following her eating plan approximately 83% of the time. She states she is walking/stair climbing 3-4 hours 1 time per week.  Kaleese is up 3 lbs (stressed - had to put her dog to sleep last week). She is up 2.4 lbs of water per the bioimpedance readings. She may not be getting enough water.  Today's visit was #21 Starting weight: 250 lbs Starting date: 09/08/2018 Today's weight: 227 lbs  Today's date: 08/24/2019 Total lbs lost to date: 23 Total lbs lost since last in-office visit: 0  Idiopathic Intracranial Hypertension [name] has a diagnosis of idiopathic intracranial hypertension and is taking Diamox.  Hypertension [name] has a diagnosis of hypertension, which is well controlled. Blood pressure today is 118/74.  ASSESSMENT AND PLAN:  Idiopathic intracranial hypertension  Essential hypertension  Class 1 obesity with serious comorbidity and body mass index (BMI) of 30.0 to 30.9 in adult, unspecified obesity type - BMI greater than 30 at start of program   PLAN:  Idiopathic Intracranial Hypertension [name] will follow-up with her PCP and neurologist.  Hypertension [name] is working on healthy weight loss and exercise to improve blood pressure control. She will continue her medications and will avoid adding salt to her diet. We will watch for signs of hypotension as she continues her lifestyle modifications.  I spent > than 50% of the 20 minute visit on counseling as documented in the note.  TIME SPENT: 20 minutes   Obesity [name] is currently in the action stage of change. As such, her goal is to continue with weight loss efforts. She has agreed to follow the Category 3 plan. [name] will work on meal planning, intentional eating, and increasing her water intake (will keep a water bottle  with her). [name] has been instructed to work up to a goal of 150 minutes of combined cardio and strengthening exercise per week for weight loss and overall health benefits. We discussed the following Behavioral Modification Strategies today: increasing lean protein intake, decreasing simple carbohydrates, increasing vegetables, increase H20 intake, decrease eating out, no skipping meals, work on meal planning and easy cooking plans, and keeping healthy foods in the home.  [name] has agreed to follow-up with our clinic in 2-3 weeks. She was informed of the importance of frequent follow-up visits to maximize her success with intensive lifestyle modifications for her multiple health conditions.  ALLERGIES: Allergies  Allergen Reactions  . Codeine Other (See Comments)    "whole body turned bright red, swelled a little" with V codiene  . Lyrica [Pregabalin] Other (See Comments)    "suicidal thoughts"  . Nyquil Multi-Symptom [Pseudoeph-Doxylamine-Dm-Apap] Other (See Comments)    Turns skin red *all over body*, pt reports getting very hot   . Sulfa Antibiotics Other (See Comments)    "body turned red"    MEDICATIONS: Current Outpatient Medications on File Prior to Visit  Medication Sig Dispense Refill  . acetaZOLAMIDE (DIAMOX) 250 MG tablet Take 2 tablets (500 mg total) by mouth 2 (two) times daily. 360 tablet 3  . albuterol (VENTOLIN HFA) 108 (90 Base) MCG/ACT inhaler Inhale 2 puffs into the lungs every 4 (four) hours as needed for wheezing or shortness of breath. 18 g 2  . Calcium Carbonate-Vitamin D (CALCIUM + D PO) Take 1 tablet by mouth daily. Calcium 600  mg, vit D3 300 mg    . cetirizine (ZYRTEC) 5 MG tablet Take 5 mg by mouth daily.    . cholecalciferol (VITAMIN D3) 25 MCG (1000 UT) tablet Take 2,000 Units by mouth daily.     . Cyanocobalamin (VITAMIN B 12 PO) Take 5,000 mcg by mouth daily.     . Echinacea 125 MG CAPS Take 125 mg by mouth daily.     Marland Kitchen lisinopril-hydrochlorothiazide  (ZESTORETIC) 20-12.5 MG tablet Take 1 tablet by mouth daily. 90 tablet 4  . Magnesium 250 MG TABS Take 250 mg by mouth daily.     [place] Omega-3 Krill Oil 500 MG CAPS Take 1 capsule by mouth daily.    . metroNIDAZOLE (METROCREAM) 0.75 % cream Apply topically 2 (two) times daily. 45 g 1  . montelukast (SINGULAIR) 10 MG tablet Take 1 tablet (10 mg total) by mouth at bedtime. 90 tablet 4  . Multiple Vitamin (MULTIVITAMIN) capsule Take 1 capsule by mouth daily.    . Oxymetazoline HCl (NASAL SPRAY NA) Place 2 sprays into the nose as needed (CONGESTION).     Marland Kitchen potassium chloride (KLOR-CON) 10 MEQ tablet Take 1 tablet (10 mEq total) by mouth daily. 90 tablet 1   No current facility-administered medications on file prior to visit.    PAST MEDICAL HISTORY: Past Medical History:  Diagnosis Date  . Asthma   . Chiari I malformation ([place])   . Chiari malformation 1996   decompression   . High blood pressure   . Hypertension   . Idiopathic intracranial hypertension   . Lactose intolerance   . Pseudotumor cerebri   . Subclavian bypass stenosis ([place]) 11/97   right  . Swallowing difficulty     PAST SURGICAL HISTORY: Past Surgical History:  Procedure Laterality Date  . APPENDECTOMY    . chiari decompression    . GANGLION CYST EXCISION Right    wrist  . LAPAROSCOPIC APPENDECTOMY N/A 10/28/2015   Procedure: APPENDECTOMY LAPAROSCOPIC;  Surgeon: Armandina Gemma, MD;  Location: [facility];  Service: General;  Laterality: N/A;  . SUBCLAVIAN BYPASS GRAFT Right 07/1996  . WRIST FRACTURE SURGERY Right 02/2007    SOCIAL HISTORY: Social History   Tobacco Use  . Smoking status: Never Smoker  . Smokeless tobacco: Never Used  Substance Use Topics  . Alcohol use: No    Alcohol/week: 0.0 standard drinks  . Drug use: No    FAMILY HISTORY: Family History  Problem Relation Age of Onset  . Cataracts Mother   . Diabetes Mother   . Hypertension Mother   . High Cholesterol Mother   . Obesity Mother   .  Diabetes Father   . Hypertension Father   . Obesity Father    ROS: Review of Systems  Constitutional: Negative for weight loss.   PHYSICAL EXAM: Blood pressure 118/74, pulse 78, temperature 98 F (36.7 C), height 5\' 4"  (1.626 m), weight 227 lb (103 kg), last menstrual period 08/16/2019, SpO2 98 %. Body mass index is 38.96 kg/m. Physical Exam Vitals reviewed.  Constitutional:      Appearance: Normal appearance. She is obese.  Cardiovascular:     Rate and Rhythm: Normal rate.     Pulses: Normal pulses.  Pulmonary:     Effort: Pulmonary effort is normal.     Breath sounds: Normal breath sounds.  Musculoskeletal:        General: Normal range of motion.  Skin:    General: Skin is warm and dry.  Neurological:  Mental Status: She is alert and oriented to person, place, and time.  Psychiatric:        Behavior: Behavior normal.   RECENT LABS AND TESTS: BMET    Component Value Date/Time   NA 141 08/04/2019 1527   K 3.4 (L) 08/04/2019 1527   CL 105 08/04/2019 1527   CO2 20 08/04/2019 1527   GLUCOSE 88 08/04/2019 1527   GLUCOSE 104 (H) 10/28/2015 2043   BUN 19 08/04/2019 1527   CREATININE 0.84 08/04/2019 1527   CALCIUM 9.7 08/04/2019 1527   GFRNONAA 84 08/04/2019 1527   GFRAA 96 08/04/2019 1527   Lab Results  Component Value Date   HGBA1C 5.1 05/04/2019   HGBA1C 5.1 01/29/2019   HGBA1C 5.2 09/08/2018   Lab Results  Component Value Date   INSULIN 13.2 05/04/2019   INSULIN 16.1 01/29/2019   INSULIN 16.9 09/08/2018   CBC    Component Value Date/Time   WBC 8.5 08/04/2019 1527   WBC 15.0 (H) 10/28/2015 1931   RBC 4.83 08/04/2019 1527   RBC 4.37 10/28/2015 1931   HGB 14.5 08/04/2019 1527   HCT 43.5 08/04/2019 1527   PLT 212 08/04/2019 1527   MCV 90 08/04/2019 1527   MCH 30.0 08/04/2019 1527   MCH 30.2 10/28/2015 1931   MCHC 33.3 08/04/2019 1527   MCHC 34.0 10/28/2015 1931   RDW 12.4 08/04/2019 1527   LYMPHSABS 2.6 08/04/2019 1527   MONOABS 0.6 05/04/2015  0250   EOSABS 0.3 08/04/2019 1527   BASOSABS 0.0 08/04/2019 1527   Iron/TIBC/Ferritin/ %Sat No results found for: IRON, TIBC, FERRITIN, IRONPCTSAT Lipid Panel     Component Value Date/Time   CHOL 167 08/04/2019 1527   TRIG 98 08/04/2019 1527   HDL 56 08/04/2019 1527   CHOLHDL 3.0 08/04/2019 1527   LDLCALC 93 08/04/2019 1527   Hepatic Function Panel     Component Value Date/Time   PROT 7.0 08/04/2019 1527   ALBUMIN 4.5 08/04/2019 1527   AST 11 08/04/2019 1527   ALT 15 08/04/2019 1527   ALKPHOS 46 08/04/2019 1527   BILITOT 0.4 08/04/2019 1527      Component Value Date/Time   TSH 2.620 08/04/2019 1527   TSH 2.450 07/28/2018 1302   TSH 1.850 12/19/2016 1057    OBESITY BEHAVIORAL INTERVENTION VISIT DOCUMENTATION FOR INSURANCE (~15 minutes)  I, Michaelene Song, am acting as Location manager for [employer], DO  I have reviewed the above documentation for accuracy and completeness, and I agree with the above. Jearld Lesch, DO

## 2019-08-31 MED FILL — MONTELUKAST SOD 10 MG TAB: 10 | 90 days supply | Qty: 90 | Fill #0

## 2019-09-21 ENCOUNTER — Ambulatory Visit (INDEPENDENT_AMBULATORY_CARE_PROVIDER_SITE_OTHER): Payer: 59 | Admitting: Family Medicine

## 2019-09-21 ENCOUNTER — Other Ambulatory Visit: Payer: Self-pay

## 2019-09-21 ENCOUNTER — Encounter (INDEPENDENT_AMBULATORY_CARE_PROVIDER_SITE_OTHER): Payer: Self-pay | Admitting: Family Medicine

## 2019-09-21 VITALS — BP 102/69 | HR 79 | Temp 98.4°F | Ht 64.0 in | Wt 226.0 lb

## 2019-09-21 DIAGNOSIS — I10 Essential (primary) hypertension: Secondary | ICD-10-CM

## 2019-09-21 DIAGNOSIS — Z6838 Body mass index (BMI) 38.0-38.9, adult: Secondary | ICD-10-CM | POA: Diagnosis not present

## 2019-09-21 DIAGNOSIS — E8881 Metabolic syndrome: Secondary | ICD-10-CM

## 2019-09-21 NOTE — Progress Notes (Signed)
Chief Complaint:   OBESITY [name] is here to discuss her progress with her obesity treatment plan along with follow-up of her obesity related diagnoses. [name] is on the Category 3 Plan and states she is following her eating plan approximately 83% of the time. [place] states she is walking 120 minutes 3 times per week.  Today's visit was #: 22 Starting weight: 250 lbs Starting date: 09/08/2018 Today's weight: 226 lbs Today's date: 09/21/2019 Total lbs lost to date: 24 Total lbs lost since last in-office visit: 1  Interim History: [name] has recently had a lot of stress with sick animals. She had to have one put to sleep.  She is not skipping meals. [name] has been eating some soup for her "lunch" at work. She works night shift. She is working on drinking more water.  Subjective:   Insulin resistance [name] has a diagnosis of insulin resistance based on her elevated fasting insulin level >5. She is not on metformin. [name] denies polyphagia. She continues to work on diet and exercise to decrease her risk of diabetes.  Lab Results  Component Value Date   INSULIN 13.2 05/04/2019   INSULIN 16.1 01/29/2019   INSULIN 16.9 09/08/2018   Lab Results  Component Value Date   HGBA1C 5.1 05/04/2019   Essential hypertension [name]'s blood pressure is low today. She denies dizziness related to this. She is on Zestoretic. Her hypertension is well controlled.  BP Readings from Last 3 Encounters:  09/21/19 102/69  08/24/19 118/74  08/10/19 101/67   Lab Results  Component Value Date   CREATININE 0.84 08/04/2019   CREATININE 0.90 07/20/2019   CREATININE 0.80 05/04/2019    Assessment/Plan:   Insulin resistance [name] will continue to work on weight loss, exercise, and decreasing simple carbohydrates to help decrease the risk of diabetes. [name] will continue with the meal plan and follow-up with [name] as directed to closely monitor her progress.  Essential hypertension [name] is working on  healthy weight loss and exercise to improve blood pressure control. [name] will continue Zestoretic and we will continue to monitor. We will watch for signs of hypotension as she continues her lifestyle modifications.  Obesity [name] is currently in the action stage of change. As such, her goal is to continue with weight loss efforts. She has agreed to the Category 3 Plan and keeping a food journal and adhering to recommended goals of 350 to 500 calories and 35 grams of protein at lunch daily.   [name] will continue walking for 120 minutes, 3 times per week.  Behavioral modification strategies: increasing lean protein intake, meal planning and cooking strategies and planning for success. [name] may journal at lunch.  [name] has agreed to follow-up with our clinic in 2 weeks. She was informed of the importance of frequent follow-up visits to maximize her success with intensive lifestyle modifications for her multiple health conditions.   Objective:   Blood pressure 102/69, pulse 79, temperature 98.4 F (36.9 C), temperature source Oral, height 5\' 4"  (1.626 m), weight 226 lb (102.5 kg), last menstrual period 09/07/2019, SpO2 99 %. Body mass index is 38.79 kg/m.  General: Cooperative, alert, well developed, in no acute distress. HEENT: Conjunctivae and lids unremarkable. Cardiovascular: Regular rhythm.  Lungs: Normal work of breathing. Neurologic: No focal deficits.   Lab Results  Component Value Date   CREATININE 0.84 08/04/2019   BUN 19 08/04/2019   NA 141 08/04/2019   K 3.4 (L) 08/04/2019   CL 105 08/04/2019  CO2 20 08/04/2019   Lab Results  Component Value Date   ALT 15 08/04/2019   AST 11 08/04/2019   ALKPHOS 46 08/04/2019   BILITOT 0.4 08/04/2019   Lab Results  Component Value Date   HGBA1C 5.1 05/04/2019   HGBA1C 5.1 01/29/2019   HGBA1C 5.2 09/08/2018   Lab Results  Component Value Date   INSULIN 13.2 05/04/2019   INSULIN 16.1 01/29/2019   INSULIN 16.9 09/08/2018   Lab  Results  Component Value Date   TSH 2.620 08/04/2019   Lab Results  Component Value Date   CHOL 167 08/04/2019   HDL 56 08/04/2019   LDLCALC 93 08/04/2019   TRIG 98 08/04/2019   CHOLHDL 3.0 08/04/2019   Lab Results  Component Value Date   WBC 8.5 08/04/2019   HGB 14.5 08/04/2019   HCT 43.5 08/04/2019   MCV 90 08/04/2019   PLT 212 08/04/2019   No results found for: IRON, TIBC, FERRITIN   Ref. Range 08/04/2019 15:27  Vitamin D, 25-Hydroxy Latest Ref Range: 30.0 - 100.0 ng/mL 33.0    Attestation Statements:   Reviewed by clinician on day of visit: allergies, medications, problem list, medical history, surgical history, family history, social history, and previous encounter notes.  Corey Skains, am acting as Location manager for [employer], FNP-C.  I have reviewed the above documentation for accuracy and completeness, and I agree with the above. -  Cailen Mihalik [employer], FNP-C

## 2019-09-22 ENCOUNTER — Encounter (INDEPENDENT_AMBULATORY_CARE_PROVIDER_SITE_OTHER): Payer: Self-pay | Admitting: Family Medicine

## 2019-10-12 ENCOUNTER — Ambulatory Visit (INDEPENDENT_AMBULATORY_CARE_PROVIDER_SITE_OTHER): Payer: 59 | Admitting: Family Medicine

## 2019-10-12 MED FILL — LISINOPRIL-HCTZ 20-12.5 MG: 20-12.5 | 90 days supply | Qty: 90 | Fill #0

## 2019-10-26 ENCOUNTER — Ambulatory Visit (INDEPENDENT_AMBULATORY_CARE_PROVIDER_SITE_OTHER): Payer: 59 | Admitting: Family Medicine

## 2019-10-27 ENCOUNTER — Ambulatory Visit (INDEPENDENT_AMBULATORY_CARE_PROVIDER_SITE_OTHER): Payer: 59 | Admitting: Family Medicine

## 2019-10-27 ENCOUNTER — Other Ambulatory Visit: Payer: Self-pay

## 2019-10-27 ENCOUNTER — Encounter (INDEPENDENT_AMBULATORY_CARE_PROVIDER_SITE_OTHER): Payer: Self-pay | Admitting: Family Medicine

## 2019-10-27 VITALS — BP 119/78 | HR 79 | Temp 97.9°F | Ht 64.0 in | Wt 226.0 lb

## 2019-10-27 DIAGNOSIS — E559 Vitamin D deficiency, unspecified: Secondary | ICD-10-CM | POA: Diagnosis not present

## 2019-10-27 DIAGNOSIS — Z6839 Body mass index (BMI) 39.0-39.9, adult: Secondary | ICD-10-CM | POA: Diagnosis not present

## 2019-10-27 DIAGNOSIS — I10 Essential (primary) hypertension: Secondary | ICD-10-CM

## 2019-10-27 NOTE — Progress Notes (Signed)
Chief Complaint:   OBESITY [name] is here to discuss her progress with her obesity treatment plan along with follow-up of her obesity related diagnoses. [name] is on the Category 3 Plan and states she is following her eating plan approximately 80% of the time. [name] states she is walking 90 minutes 3 times per week.  Today's visit was #: 23 Starting weight: 250 lbs Starting date: 09/08/2018 Today's weight: 227 lbs Today's date: 10/27/2019 Total lbs lost to date: 23 Total lbs lost since last in-office visit: 0  Interim History: [name] has had a stressful few months and one of her dogs had to be put down, and one of her cats is having medical issues. Her husband is starting the program today, so she is hoping that will help her stay more on track. She is planning on getting the second COVID vaccine today.  Subjective:   Vitamin D deficiency [name]'s Vitamin D level was 33.0 on 08/04/19. She is on 1,000 IU vit D daily. She denies nausea, vomiting or muscle weakness.  Essential hypertension [name]'s blood pressure is controlled today on Lisinopril-HCTZ. She denies chest pain, chest pressure or headache.  BP Readings from Last 3 Encounters:  10/27/19 119/78  09/21/19 102/69  08/24/19 118/74   Lab Results  Component Value Date   CREATININE 0.84 08/04/2019   CREATININE 0.90 07/20/2019   CREATININE 0.80 05/04/2019   Assessment/Plan:   Vitamin D deficiency Low Vitamin D level contributes to fatigue and are associated with obesity, breast, and colon cancer. [name] will continue vitamin D 1,000 IU daily and we will repeat labs in early April. She will follow-up for routine testing of Vitamin D, at least 2-3 times per year to avoid over-replacement.  Essential hypertension [name] is working on healthy weight loss and exercise to improve blood pressure control. [name] will continue Lisinopril-HCTZ. We will watch for signs of hypotension as she continues her lifestyle  modifications.  Class 2 severe obesity with serious comorbidity and body mass index (BMI) of 39.0 to 39.9 in adult, unspecified obesity type (HCC) [name] is currently in the action stage of change. As such, her goal is to continue with weight loss efforts. She has agreed to the Category 3 Plan.   Behavioral modification strategies: increasing lean protein intake, increasing vegetables, meal planning and cooking strategies, keeping healthy foods in the home and planning for success.  [name] has agreed to follow-up with our clinic in 2 weeks. She was informed of the importance of frequent follow-up visits to maximize her success with intensive lifestyle modifications for her multiple health conditions.   Objective:   Blood pressure 119/78, pulse 79, temperature 97.9 F (36.6 C), temperature source Oral, height 5\' 4"  (1.626 m), weight 226 lb (102.5 kg), last menstrual period 10/12/2019, SpO2 100 %. Body mass index is 38.79 kg/m.  General: Cooperative, alert, well developed, in no acute distress. HEENT: Conjunctivae and lids unremarkable. Cardiovascular: Regular rhythm.  Lungs: Normal work of breathing. Neurologic: No focal deficits.   Lab Results  Component Value Date   CREATININE 0.84 08/04/2019   BUN 19 08/04/2019   NA 141 08/04/2019   K 3.4 (L) 08/04/2019   CL 105 08/04/2019   CO2 20 08/04/2019   Lab Results  Component Value Date   ALT 15 08/04/2019   AST 11 08/04/2019   ALKPHOS 46 08/04/2019   BILITOT 0.4 08/04/2019   Lab Results  Component Value Date   HGBA1C 5.1 05/04/2019   HGBA1C 5.1 01/29/2019  HGBA1C 5.2 09/08/2018   Lab Results  Component Value Date   INSULIN 13.2 05/04/2019   INSULIN 16.1 01/29/2019   INSULIN 16.9 09/08/2018   Lab Results  Component Value Date   TSH 2.620 08/04/2019   Lab Results  Component Value Date   CHOL 167 08/04/2019   HDL 56 08/04/2019   LDLCALC 93 08/04/2019   TRIG 98 08/04/2019   CHOLHDL 3.0 08/04/2019   Lab Results   Component Value Date   WBC 8.5 08/04/2019   HGB 14.5 08/04/2019   HCT 43.5 08/04/2019   MCV 90 08/04/2019   PLT 212 08/04/2019   No results found for: IRON, TIBC, FERRITIN   Ref. Range 08/04/2019 15:27  Vitamin D, 25-Hydroxy Latest Ref Range: 30.0 - 100.0 ng/mL 33.0    Attestation Statements:   Reviewed by clinician on day of visit: allergies, medications, problem list, medical history, surgical history, family history, social history, and previous encounter notes.  Time spent on visit including pre-visit chart review and post-visit care was 15 minutes.   I, Doreene Nest, am acting as transcriptionist for Coralie Common, MD.  I have reviewed the above documentation for accuracy and completeness, and I agree with the above. - Ilene Qua, MD

## 2019-11-12 ENCOUNTER — Other Ambulatory Visit: Payer: Self-pay

## 2019-11-12 ENCOUNTER — Encounter (INDEPENDENT_AMBULATORY_CARE_PROVIDER_SITE_OTHER): Payer: Self-pay | Admitting: Family Medicine

## 2019-11-12 ENCOUNTER — Ambulatory Visit (INDEPENDENT_AMBULATORY_CARE_PROVIDER_SITE_OTHER): Payer: 59 | Admitting: Family Medicine

## 2019-11-12 VITALS — BP 119/77 | HR 69 | Temp 97.6°F | Ht 64.0 in | Wt 230.0 lb

## 2019-11-12 DIAGNOSIS — E559 Vitamin D deficiency, unspecified: Secondary | ICD-10-CM

## 2019-11-12 DIAGNOSIS — Z6839 Body mass index (BMI) 39.0-39.9, adult: Secondary | ICD-10-CM

## 2019-11-12 DIAGNOSIS — E8881 Metabolic syndrome: Secondary | ICD-10-CM | POA: Diagnosis not present

## 2019-11-12 MED FILL — acetaZOLAMIDE 250 MG TABS: 250 | 90 days supply | Qty: 360 | Fill #1

## 2019-11-12 MED FILL — POTASSIUM CHLORIDE CRYS ER: 10 | 90 days supply | Qty: 90 | Fill #1

## 2019-11-12 NOTE — Progress Notes (Signed)
Chief Complaint:   OBESITY [name] is here to discuss her progress with her obesity treatment plan along with follow-up of her obesity related diagnoses. [name] is on the Category 3 Plan and states she is following her eating plan approximately 80% of the time. [name] states she is exercising for 0 minutes 0 times per week.  Today's visit was #: 24 Starting weight: 250 lbs Starting date: 09/08/2018 Today's weight: 230 lbs Today's date: 11/12/2019 Total lbs lost to date: 20 lbs Total lbs lost since last in-office visit: 0  Interim History: [name]'s husband started the program and she voices that he has not changed to the meal plan.  She states that she is going to have to start cooking on the weekend to have food available that is on plan.  She just finished her menstrual cycle.  Subjective:   1. Insulin resistance [name] has a diagnosis of insulin resistance based on her elevated fasting insulin level >5. She continues to work on diet and exercise to decrease her risk of diabetes.  She endorses occasional carb cravings and indulgences.  Lab Results  Component Value Date   INSULIN 13.2 05/04/2019   INSULIN 16.1 01/29/2019   INSULIN 16.9 09/08/2018   Lab Results  Component Value Date   HGBA1C 5.1 05/04/2019   2. Vitamin D deficiency [name]'s Vitamin D level was 33.0 on 08/04/2019. She is currently taking OTC vit D. She denies nausea, vomiting or muscle weakness.  She endorses fatigue.  Assessment/Plan:   1. Insulin resistance [name] will continue to work on weight loss, exercise, and decreasing simple carbohydrates to help decrease the risk of diabetes. [name] agreed to follow-up with [name] as directed to closely monitor her progress.  Will recheck labs at next appointment.  2. Vitamin D deficiency Low Vitamin D level contributes to fatigue and are associated with obesity, breast, and colon cancer. She agrees to continue to take prescription Vitamin D @50 ,000 IU every week and will follow-up for  routine testing of Vitamin D, at least 2-3 times per year to avoid over-replacement.  Will recheck labs at next appointment.  3. Class 2 severe obesity with serious comorbidity and body mass index (BMI) of 39.0 to 39.9 in adult, unspecified obesity type (HCC) [name] is currently in the action stage of change. As such, her goal is to continue with weight loss efforts. She has agreed to the Category 3 Plan.   The patient is to go out grocery shopping tomorrow and cook primarily at home.  Exercise goals: As is.  Behavioral modification strategies: increasing lean protein intake, increasing vegetables, meal planning and cooking strategies, keeping healthy foods in the home and planning for success.  [name] has agreed to follow-up with our clinic in 2 weeks. She was informed of the importance of frequent follow-up visits to maximize her success with intensive lifestyle modifications for her multiple health conditions.   Objective:   Blood pressure 119/77, pulse 69, temperature 97.6 F (36.4 C), temperature source Oral, height 5\' 4"  (1.626 m), weight 230 lb (104.3 kg), last menstrual period 11/09/2019, SpO2 100 %. Body mass index is 39.48 kg/m.  General: Cooperative, alert, well developed, in no acute distress. HEENT: Conjunctivae and lids unremarkable. Cardiovascular: Regular rhythm.  Lungs: Normal work of breathing. Neurologic: No focal deficits.   Lab Results  Component Value Date   CREATININE 0.84 08/04/2019   BUN 19 08/04/2019   NA 141 08/04/2019   K 3.4 (L) 08/04/2019   CL 105 08/04/2019  CO2 20 08/04/2019   Lab Results  Component Value Date   ALT 15 08/04/2019   AST 11 08/04/2019   ALKPHOS 46 08/04/2019   BILITOT 0.4 08/04/2019   Lab Results  Component Value Date   HGBA1C 5.1 05/04/2019   HGBA1C 5.1 01/29/2019   HGBA1C 5.2 09/08/2018   Lab Results  Component Value Date   INSULIN 13.2 05/04/2019   INSULIN 16.1 01/29/2019   INSULIN 16.9 09/08/2018   Lab Results    Component Value Date   TSH 2.620 08/04/2019   Lab Results  Component Value Date   CHOL 167 08/04/2019   HDL 56 08/04/2019   LDLCALC 93 08/04/2019   TRIG 98 08/04/2019   CHOLHDL 3.0 08/04/2019   Lab Results  Component Value Date   WBC 8.5 08/04/2019   HGB 14.5 08/04/2019   HCT 43.5 08/04/2019   MCV 90 08/04/2019   PLT 212 08/04/2019   Attestation Statements:   Reviewed by clinician on day of visit: allergies, medications, problem list, medical history, surgical history, family history, social history, and previous encounter notes.  Time spent on visit including pre-visit chart review and post-visit care and charting was 12 minutes.   I, Water quality scientist, CMA, am acting as transcriptionist for Coralie Common, MD.  I have reviewed the above documentation for accuracy and completeness, and I agree with the above. - Ilene Qua, MD

## 2019-11-30 ENCOUNTER — Other Ambulatory Visit: Payer: Self-pay

## 2019-11-30 ENCOUNTER — Ambulatory Visit (INDEPENDENT_AMBULATORY_CARE_PROVIDER_SITE_OTHER): Payer: 59 | Admitting: Family Medicine

## 2019-11-30 ENCOUNTER — Encounter (INDEPENDENT_AMBULATORY_CARE_PROVIDER_SITE_OTHER): Payer: Self-pay | Admitting: Family Medicine

## 2019-11-30 VITALS — BP 117/76 | HR 87 | Temp 98.2°F | Ht 64.0 in | Wt 227.0 lb

## 2019-11-30 DIAGNOSIS — Z6839 Body mass index (BMI) 39.0-39.9, adult: Secondary | ICD-10-CM

## 2019-11-30 DIAGNOSIS — E8881 Metabolic syndrome: Secondary | ICD-10-CM | POA: Diagnosis not present

## 2019-12-01 ENCOUNTER — Encounter (INDEPENDENT_AMBULATORY_CARE_PROVIDER_SITE_OTHER): Payer: Self-pay | Admitting: Family Medicine

## 2019-12-01 NOTE — Progress Notes (Signed)
Chief Complaint:   OBESITY [name] is here to discuss her progress with her obesity treatment plan along with follow-up of her obesity related diagnoses. [name] is on the Category 3 Plan and states she is following her eating plan approximately 85% of the time. [name] states she is doing 0 minutes 0 times per week.  Today's visit was #: 25 Starting weight: 250 lbs Starting date: 09/08/2018 Today's weight: 227 lbs Today's date: 11/30/2019 Total lbs lost to date: 23 Total lbs lost since last in-office visit: 3  Interim History: [name]'s husband is coming to our clinic, but not following the plan.  She has been cooking more. She is not always getting a break to eat at work (on the night shift) so she has skipped some meals. She reports adequate water intake.  Subjective:   1. Insulin resistance [name] is not on metformin. She denies polyphagia. Lab Results  Component Value Date   HGBA1C 5.1 05/04/2019    Assessment/Plan:   1. Insulin resistance [name] will continue her meal plan, and will continue to work on weight loss, exercise, and decreasing simple carbohydrates to help decrease the risk of diabetes. [name] agreed to follow-up with [name] as directed to closely monitor her progress.  2. Class 2 severe obesity with serious comorbidity and body mass index (BMI) of 39.0 to 39.9 in adult, unspecified obesity type (HCC) [name] is currently in the action stage of change. As such, her goal is to continue with weight loss efforts. She has agreed to the Category 3 Plan.   [name] may have a protein bar when she is unable to eat a meal at work.  Exercise goals: [name] is to exercise for 30 minutes 2-3 times per week.  Behavioral modification strategies: no skipping meals and meal planning and cooking strategies.  [name] has agreed to follow-up with our clinic in 3 weeks. She was informed of the importance of frequent follow-up visits to maximize her success with intensive lifestyle modifications for her  multiple health conditions.   Objective:   Blood pressure 117/76, pulse 87, temperature 98.2 F (36.8 C), temperature source Oral, height 5\' 4"  (1.626 m), weight 227 lb (103 kg), last menstrual period 11/09/2019, SpO2 98 %. Body mass index is 38.96 kg/m.  General: Cooperative, alert, well developed, in no acute distress. HEENT: Conjunctivae and lids unremarkable. Cardiovascular: Regular rhythm.  Lungs: Normal work of breathing. Neurologic: No focal deficits.   Lab Results  Component Value Date   CREATININE 0.84 08/04/2019   BUN 19 08/04/2019   NA 141 08/04/2019   K 3.4 (L) 08/04/2019   CL 105 08/04/2019   CO2 20 08/04/2019   Lab Results  Component Value Date   ALT 15 08/04/2019   AST 11 08/04/2019   ALKPHOS 46 08/04/2019   BILITOT 0.4 08/04/2019   Lab Results  Component Value Date   HGBA1C 5.1 05/04/2019   HGBA1C 5.1 01/29/2019   HGBA1C 5.2 09/08/2018   Lab Results  Component Value Date   INSULIN 13.2 05/04/2019   INSULIN 16.1 01/29/2019   INSULIN 16.9 09/08/2018   Lab Results  Component Value Date   TSH 2.620 08/04/2019   Lab Results  Component Value Date   CHOL 167 08/04/2019   HDL 56 08/04/2019   LDLCALC 93 08/04/2019   TRIG 98 08/04/2019   CHOLHDL 3.0 08/04/2019   Lab Results  Component Value Date   WBC 8.5 08/04/2019   HGB 14.5 08/04/2019   HCT 43.5 08/04/2019  MCV 90 08/04/2019   PLT 212 08/04/2019   No results found for: IRON, TIBC, FERRITIN  Attestation Statements:   Reviewed by clinician on day of visit: allergies, medications, problem list, medical history, surgical history, family history, social history, and previous encounter notes.   Wilhemena Durie, am acting as Location manager for [employer], FNP-C.  I have reviewed the above documentation for accuracy and completeness, and I agree with the above. -  Georgianne Fick, FNP

## 2019-12-07 MED FILL — MONTELUKAST SOD 10 MG TAB: 10 | 90 days supply | Qty: 90 | Fill #1

## 2019-12-21 ENCOUNTER — Ambulatory Visit (INDEPENDENT_AMBULATORY_CARE_PROVIDER_SITE_OTHER): Payer: 59 | Admitting: Family Medicine

## 2019-12-21 ENCOUNTER — Other Ambulatory Visit: Payer: Self-pay

## 2019-12-21 ENCOUNTER — Encounter (INDEPENDENT_AMBULATORY_CARE_PROVIDER_SITE_OTHER): Payer: Self-pay | Admitting: Family Medicine

## 2019-12-21 VITALS — BP 115/74 | HR 79 | Temp 97.9°F | Ht 64.0 in | Wt 228.0 lb

## 2019-12-21 DIAGNOSIS — E559 Vitamin D deficiency, unspecified: Secondary | ICD-10-CM

## 2019-12-21 DIAGNOSIS — Z9189 Other specified personal risk factors, not elsewhere classified: Secondary | ICD-10-CM

## 2019-12-21 DIAGNOSIS — E876 Hypokalemia: Secondary | ICD-10-CM | POA: Diagnosis not present

## 2019-12-21 DIAGNOSIS — Z6839 Body mass index (BMI) 39.0-39.9, adult: Secondary | ICD-10-CM | POA: Diagnosis not present

## 2019-12-21 DIAGNOSIS — E8881 Metabolic syndrome: Secondary | ICD-10-CM

## 2019-12-21 DIAGNOSIS — E785 Hyperlipidemia, unspecified: Secondary | ICD-10-CM

## 2019-12-21 MED ORDER — SAXENDA 18 MG/3ML ~~LOC~~ SOPN: 3.0000 mg | PEN_INJECTOR | Freq: Every day | SUBCUTANEOUS | 0 refills | Status: DC

## 2019-12-21 MED ORDER — INSULIN PEN NEEDLE 32G X 4 MM MISC: 0 refills | Status: DC

## 2019-12-22 LAB — COMPREHENSIVE METABOLIC PANEL
ALT: 12 IU/L (ref 0–32)
AST: 11 IU/L (ref 0–40)
Albumin/Globulin Ratio: 1.7 (ref 1.2–2.2)
Albumin: 4.3 g/dL (ref 3.8–4.8)
Alkaline Phosphatase: 47 IU/L (ref 39–117)
BUN/Creatinine Ratio: 22 (ref 9–23)
BUN: 17 mg/dL (ref 6–24)
Bilirubin Total: 0.3 mg/dL (ref 0.0–1.2)
CO2: 17 mmol/L — ABNORMAL LOW (ref 20–29)
Calcium: 9.1 mg/dL (ref 8.7–10.2)
Chloride: 109 mmol/L — ABNORMAL HIGH (ref 96–106)
Creatinine, Ser: 0.79 mg/dL (ref 0.57–1.00)
GFR calc Af Amer: 104 mL/min/{1.73_m2} (ref >59)
GFR calc non Af Amer: 90 mL/min/{1.73_m2} (ref >59)
Globulin, Total: 2.6 g/dL (ref 1.5–4.5)
Glucose: 91 mg/dL (ref 65–99)
Potassium: 3.9 mmol/L (ref 3.5–5.2)
Sodium: 140 mmol/L (ref 134–144)
Total Protein: 6.9 g/dL (ref 6.0–8.5)

## 2019-12-22 LAB — HEMOGLOBIN A1C
Est. average glucose Bld gHb Est-mCnc: 100 mg/dL
Hgb A1c MFr Bld: 5.1 % (ref 4.8–5.6)

## 2019-12-22 LAB — VITAMIN D 25 HYDROXY (VIT D DEFICIENCY, FRACTURES): Vit D, 25-Hydroxy: 58.5 ng/mL (ref 30.0–100.0)

## 2019-12-22 LAB — INSULIN, RANDOM: INSULIN: 22.1 u[IU]/mL (ref 2.6–24.9)

## 2019-12-22 NOTE — Progress Notes (Signed)
Chief Complaint:   OBESITY [name] is here to discuss her progress with her obesity treatment plan along with follow-up of her obesity related diagnoses. [name] is on the Category 3 Plan and states she is following her eating plan approximately 85% of the time. [name] states she is on the elliptical for 30 minutes 2 times per week.  Today's visit was #: 26 Starting weight: 250 lbs Starting date: 09/08/2018 Today's weight: 228 lbs Today's date: 12/21/2019 Total lbs lost to date: 22 Total lbs lost since last in-office visit: 0  Interim History: [name] notes recent death of her uncle and she has been off the plan. She is surprised she has gained 1 lb because she feels she has done well on the plan. Her husband is coming to out clinic now so temptations are less.  Subjective:   1. Hypokalemia [name] has a history of hypokalemia. She is on 10 meq KCI daily. She notes occasional palpitations. Her primary care is aware of this.  2. Insulin resistance [name] is not on metformin, and she denies polyphagia. She notes cravings at times. Lab Results  Component Value Date   HGBA1C 5.1 12/21/2019     3. Vitamin D deficiency [name] is on 1,000 OTC Vit D. Last Vit D level was low at 33.  4. At risk for side effect of medication [name] is at risk for drug side effects (nausea, constipation) due to starting Saxenda.  Assessment/Plan:   1. Hypokalemia We will check CMP today. [name] will follow up with her primary care physician for palpitations.  - Comprehensive metabolic panel  2. Insulin resistance [name] will continue to work on weight loss, exercise, and decreasing simple carbohydrates to help decrease the risk of diabetes. We will check labs today. [name] agreed to follow-up with [name] as directed to closely monitor her progress.  - Hemoglobin A1c - Insulin, random  3. Vitamin D deficiency Low Vitamin D level contributes to fatigue and are associated with obesity, breast, and colon cancer. [name] agreed  to continue taking OTC Vit D and will follow-up for routine testing of Vitamin D, at least 2-3 times per year to avoid over-replacement. We will check labs today.  - VITAMIN D 25 Hydroxy (Vit-D Deficiency, Fractures)  4. At risk for side effect of medication [name] was given approximately 15 minutes of drug side effect counseling today. We discussed side effect possibility and risk versus benefits. [name] agreed to the medication and will contact this office if these side effects are intolerable.  Kylie spaced learning was employed today to elicit superior memory formation and behavioral change.  5. Class 2 severe obesity with serious comorbidity and body mass index (BMI) of 39.0 to 39.9 in adult, unspecified obesity type (HCC) [name] is currently in the action stage of change. As such, her goal is to continue with weight loss efforts. She has agreed to the Category 3 Plan.   We discussed various medication options to help [name] with her weight loss efforts and we both agreed to start Saxenda 3.0 mg SubQ daily #5 pens (she is to take 0.6 mg daily), and nano needles #100 with no refills.  - Liraglutide -Weight Management (SAXENDA) 18 MG/3ML SOPN; Inject 0.5 mLs (3 mg total) into the skin daily.  Dispense: 5 pen; Refill: 0 - Insulin Pen Needle 32G X 4 MM MISC; Use with saxenda to inject into skin subcutaneous daily  Dispense: 100 each; Refill: 0  Exercise goals: As is.  Behavioral modification strategies: decreasing  simple carbohydrates and planning for success.  [name] has agreed to follow-up with our clinic in 3 weeks. She was informed of the importance of frequent follow-up visits to maximize her success with intensive lifestyle modifications for her multiple health conditions.   Objective:   Blood pressure 115/74, pulse 79, temperature 97.9 F (36.6 C), temperature source Oral, height 5\' 4"  (1.626 m), weight 228 lb (103.4 kg), last menstrual period 11/19/2019, SpO2 99 %. Body mass index is  39.14 kg/m.  General: Cooperative, alert, well developed, in no acute distress. HEENT: Conjunctivae and lids unremarkable. Cardiovascular: Regular rhythm.  Lungs: Normal work of breathing. Neurologic: No focal deficits.   Lab Results  Component Value Date   CREATININE 0.79 12/21/2019   BUN 17 12/21/2019   NA 140 12/21/2019   K 3.9 12/21/2019   CL 109 (H) 12/21/2019   CO2 17 (L) 12/21/2019   Lab Results  Component Value Date   ALT 12 12/21/2019   AST 11 12/21/2019   ALKPHOS 47 12/21/2019   BILITOT 0.3 12/21/2019   Lab Results  Component Value Date   HGBA1C 5.1 12/21/2019   HGBA1C 5.1 05/04/2019   HGBA1C 5.1 01/29/2019   HGBA1C 5.2 09/08/2018   Lab Results  Component Value Date   INSULIN WILL FOLLOW 12/21/2019   INSULIN 13.2 05/04/2019   INSULIN 16.1 01/29/2019   INSULIN 16.9 09/08/2018   Lab Results  Component Value Date   TSH 2.620 08/04/2019   Lab Results  Component Value Date   CHOL 167 08/04/2019   HDL 56 08/04/2019   LDLCALC 93 08/04/2019   TRIG 98 08/04/2019   CHOLHDL 3.0 08/04/2019   Lab Results  Component Value Date   WBC 8.5 08/04/2019   HGB 14.5 08/04/2019   HCT 43.5 08/04/2019   MCV 90 08/04/2019   PLT 212 08/04/2019   No results found for: IRON, TIBC, FERRITIN  Attestation Statements:   Reviewed by clinician on day of visit: allergies, medications, problem list, medical history, surgical history, family history, social history, and previous encounter notes.   Wilhemena Durie, am acting as Location manager for Kylie [employer], FNP-C.  I have reviewed the above documentation for accuracy and completeness, and I agree with the above. -  Georgianne Fick, FNP

## 2019-12-23 ENCOUNTER — Encounter (INDEPENDENT_AMBULATORY_CARE_PROVIDER_SITE_OTHER): Payer: Self-pay | Admitting: Family Medicine

## 2019-12-24 ENCOUNTER — Encounter (INDEPENDENT_AMBULATORY_CARE_PROVIDER_SITE_OTHER): Payer: Self-pay

## 2019-12-24 MED FILL — UNIFINE PENTIPS 32GX5/32: 32G X 4 MM | 90 days supply | Qty: 100 | Fill #0

## 2019-12-24 MED FILL — SAXENDA 18 MG/3 ML PEN: 18 | 30 days supply | Qty: 15 | Fill #0

## 2020-01-04 ENCOUNTER — Other Ambulatory Visit: Payer: Self-pay

## 2020-01-04 ENCOUNTER — Ambulatory Visit (INDEPENDENT_AMBULATORY_CARE_PROVIDER_SITE_OTHER): Payer: 59 | Admitting: Family Medicine

## 2020-01-04 ENCOUNTER — Encounter (INDEPENDENT_AMBULATORY_CARE_PROVIDER_SITE_OTHER): Payer: Self-pay | Admitting: Family Medicine

## 2020-01-04 VITALS — BP 100/67 | HR 81 | Temp 98.4°F | Ht 64.0 in | Wt 227.0 lb

## 2020-01-04 DIAGNOSIS — Z6839 Body mass index (BMI) 39.0-39.9, adult: Secondary | ICD-10-CM

## 2020-01-04 DIAGNOSIS — Z9189 Other specified personal risk factors, not elsewhere classified: Secondary | ICD-10-CM | POA: Diagnosis not present

## 2020-01-04 DIAGNOSIS — E559 Vitamin D deficiency, unspecified: Secondary | ICD-10-CM

## 2020-01-04 DIAGNOSIS — E8881 Metabolic syndrome: Secondary | ICD-10-CM

## 2020-01-05 NOTE — Progress Notes (Signed)
Chief Complaint:   OBESITY [name] is here to discuss her progress with her obesity treatment plan along with follow-up of her obesity related diagnoses. [name] is on the Category 3 Plan and states she is following her eating plan approximately 80% of the time. [name] states she is walking and doing fitness camp for 25 minutes 3 times per week.  Today's visit was #: 69 Starting weight: 250 lbs Starting date: 09/08/2018 Today's weight: 227 lbs Today's date: 01/04/2020 Total lbs lost to date: 23 Total lbs lost since last in-office visit: 1  Interim History: [name] was started on Saxenda at her last in office visit and she has been taking 0.6 mg daily. She denies nausea. She notes fatigue, but she has also had busy shifts at work. She notes less hunger. She does not always get a meal break at work.  Subjective:   1. Vitamin D deficiency [name] is on OTC Vit D 1,000 IU daily, and her level is now at goal. I discussed labs with the patient today.  2. Insulin resistance [name]'s fasting insulin is worsening and is at 22.1 (up from 13.2). She has a family history of diabetes (both parents). Her A1c is stable at 5.1. I discussed labs with the patient today.  3. At risk for diabetes mellitus [name] is at higher than average risk for developing diabetes due to her obesity, family history of diabetes mellitus, and insulin resistance.   Assessment/Plan:   1. Vitamin D deficiency Low Vitamin D level contributes to fatigue and are associated with obesity, breast, and colon cancer. [name] agreed to continue taking OTC Vit D 1,000 IU daily and will follow-up for routine testing of Vitamin D, at least 2-3 times per year to avoid over-replacement.  2. Insulin resistance [name] will continue her meal plan, and will continue to work on weight loss, exercise, and decreasing simple carbohydrates to help decrease the risk of diabetes. [name] agreed to continue Saxenda 0.6 mg, and she agreed to follow-up with [name] as directed  to closely monitor her progress.  3. At risk for diabetes mellitus [name] was given approximately 15 minutes of diabetes education and counseling today. We discussed intensive lifestyle modifications today with an emphasis on weight loss as well as increasing exercise and decreasing simple carbohydrates in her diet. We also reviewed medication options with an emphasis on risk versus benefit of those discussed.   Repetitive spaced learning was employed today to elicit superior memory formation and behavioral change.  4. Class 2 severe obesity with serious comorbidity and body mass index (BMI) of 39.0 to 39.9 in adult, unspecified obesity type (HCC) [name] is currently in the action stage of change. As such, her goal is to continue with weight loss efforts. She has agreed to the Category 3 Plan.   Exercise goals: As is.  Behavioral modification strategies: increasing lean protein intake, decreasing simple carbohydrates and no skipping meals.  [name] has agreed to follow-up with our clinic in 2 weeks. She was informed of the importance of frequent follow-up visits to maximize her success with intensive lifestyle modifications for her multiple health conditions.   Objective:   Blood pressure 100/67, pulse 81, temperature 98.4 F (36.9 C), temperature source Oral, height 5\' 4"  (1.626 m), weight 227 lb (103 kg), SpO2 100 %. Body mass index is 38.96 kg/m.  General: Cooperative, alert, well developed, in no acute distress. HEENT: Conjunctivae and lids unremarkable. Cardiovascular: Regular rhythm.  Lungs: Normal work of breathing. Neurologic: No focal deficits.  Lab Results  Component Value Date   CREATININE 0.79 12/21/2019   BUN 17 12/21/2019   NA 140 12/21/2019   K 3.9 12/21/2019   CL 109 (H) 12/21/2019   CO2 17 (L) 12/21/2019   Lab Results  Component Value Date   ALT 12 12/21/2019   AST 11 12/21/2019   ALKPHOS 47 12/21/2019   BILITOT 0.3 12/21/2019   Lab Results  Component Value  Date   HGBA1C 5.1 12/21/2019   HGBA1C 5.1 05/04/2019   HGBA1C 5.1 01/29/2019   HGBA1C 5.2 09/08/2018   Lab Results  Component Value Date   INSULIN 22.1 12/21/2019   INSULIN 13.2 05/04/2019   INSULIN 16.1 01/29/2019   INSULIN 16.9 09/08/2018   Lab Results  Component Value Date   TSH 2.620 08/04/2019   Lab Results  Component Value Date   CHOL 167 08/04/2019   HDL 56 08/04/2019   LDLCALC 93 08/04/2019   TRIG 98 08/04/2019   CHOLHDL 3.0 08/04/2019   Lab Results  Component Value Date   WBC 8.5 08/04/2019   HGB 14.5 08/04/2019   HCT 43.5 08/04/2019   MCV 90 08/04/2019   PLT 212 08/04/2019   No results found for: IRON, TIBC, FERRITIN  Attestation Statements:   Reviewed by clinician on day of visit: allergies, medications, problem list, medical history, surgical history, family history, social history, and previous encounter notes.   Wilhemena Durie, am acting as Location manager for [employer], FNP-C.  I have reviewed the above documentation for accuracy and completeness, and I agree with the above. -  Georgianne Fick, FNP

## 2020-01-06 ENCOUNTER — Encounter (INDEPENDENT_AMBULATORY_CARE_PROVIDER_SITE_OTHER): Payer: Self-pay | Admitting: Family Medicine

## 2020-01-10 MED FILL — LISINOPRIL-HCTZ 20-12.5 MG: 20-12.5 | 90 days supply | Qty: 90 | Fill #1

## 2020-01-25 ENCOUNTER — Other Ambulatory Visit: Payer: Self-pay

## 2020-01-25 ENCOUNTER — Encounter (INDEPENDENT_AMBULATORY_CARE_PROVIDER_SITE_OTHER): Payer: Self-pay | Admitting: Family Medicine

## 2020-01-25 ENCOUNTER — Ambulatory Visit (INDEPENDENT_AMBULATORY_CARE_PROVIDER_SITE_OTHER): Payer: 59 | Admitting: Family Medicine

## 2020-01-25 VITALS — BP 108/71 | HR 82 | Temp 98.3°F | Ht 64.0 in | Wt 232.0 lb

## 2020-01-25 DIAGNOSIS — E559 Vitamin D deficiency, unspecified: Secondary | ICD-10-CM

## 2020-01-25 DIAGNOSIS — Z6839 Body mass index (BMI) 39.0-39.9, adult: Secondary | ICD-10-CM | POA: Diagnosis not present

## 2020-01-25 NOTE — Progress Notes (Signed)
Chief Complaint:   OBESITY [name] is here to discuss her progress with her obesity treatment plan along with follow-up of her obesity related diagnoses. [name] is on the Category 3 Plan and states she is following her eating plan approximately 75-76% of the time. [name] states she is walking for 20 minutes 3 times per week.  Today's visit was #: 28 Starting weight: 250 lbs Starting date: 09/08/2018 Today's weight: 232 lbs Today's date: 01/25/2020 Total lbs lost to date: 18 Total lbs lost since last in-office visit: 0  Interim History: [name] notes she does not get meals in at work often, and she eats a protein bar instead. She is working on getting protein in. She is on Saxenda at 0.6 mg and she notes good control of hunger. She reports deviating from her plan at other meal times also.  Subjective:   1. Vitamin D deficiency [name]'s last Vit D level was at goal at 58.5. She is taking 5,000 IU daily.  Assessment/Plan:   1. Vitamin D deficiency Low Vitamin D level contributes to fatigue and are associated with obesity, breast, and colon cancer. [name] agreed to continue taking Vitamin D 5,000 IU every daily and will follow-up for routine testing of Vitamin D, at least 2-3 times per year to avoid over-replacement.  2. Class 2 severe obesity with serious comorbidity and body mass index (BMI) of 39.0 to 39.9 in adult, unspecified obesity type (HCC) [name] is currently in the action stage of change. As such, her goal is to continue with weight loss efforts. She has agreed to the Category 3 Plan and keeping a food journal and adhering to recommended goals of 250-350 calories and 25 grams of protein at breakfast daily.   We discussed various medication options to help Jinan with her weight loss efforts and we both agreed to continue Saxenda at 0.6 mg SubQ daily.  Handout given today: Recipes.  Exercise goals: All adults should avoid inactivity. Some physical activity is better than none, and adults who  participate in any amount of physical activity gain some health benefits.  Behavioral modification strategies: increasing lean protein intake, decreasing eating out and meal planning and cooking strategies.  [name] has agreed to follow-up with our clinic in 3 weeks. She was informed of the importance of frequent follow-up visits to maximize her success with intensive lifestyle modifications for her multiple health conditions.   Objective:   Blood pressure 108/71, pulse 82, temperature 98.3 F (36.8 C), temperature source Oral, height 5\' 4"  (1.626 m), weight 232 lb (105.2 kg), SpO2 95 %. Body mass index is 39.82 kg/m.  General: Cooperative, alert, well developed, in no acute distress. HEENT: Conjunctivae and lids unremarkable. Cardiovascular: Regular rhythm.  Lungs: Normal work of breathing. Neurologic: No focal deficits.   Lab Results  Component Value Date   CREATININE 0.79 12/21/2019   BUN 17 12/21/2019   NA 140 12/21/2019   K 3.9 12/21/2019   CL 109 (H) 12/21/2019   CO2 17 (L) 12/21/2019   Lab Results  Component Value Date   ALT 12 12/21/2019   AST 11 12/21/2019   ALKPHOS 47 12/21/2019   BILITOT 0.3 12/21/2019   Lab Results  Component Value Date   HGBA1C 5.1 12/21/2019   HGBA1C 5.1 05/04/2019   HGBA1C 5.1 01/29/2019   HGBA1C 5.2 09/08/2018   Lab Results  Component Value Date   INSULIN 22.1 12/21/2019   INSULIN 13.2 05/04/2019   INSULIN 16.1 01/29/2019   INSULIN 16.9 09/08/2018  Lab Results  Component Value Date   TSH 2.620 08/04/2019   Lab Results  Component Value Date   CHOL 167 08/04/2019   HDL 56 08/04/2019   LDLCALC 93 08/04/2019   TRIG 98 08/04/2019   CHOLHDL 3.0 08/04/2019   Lab Results  Component Value Date   WBC 8.5 08/04/2019   HGB 14.5 08/04/2019   HCT 43.5 08/04/2019   MCV 90 08/04/2019   PLT 212 08/04/2019   No results found for: IRON, TIBC, FERRITIN  Attestation Statements:   Reviewed by clinician on day of visit: allergies,  medications, problem list, medical history, surgical history, family history, social history, and previous encounter notes.   Wilhemena Durie, am acting as Location manager for [employer], FNP-C.  I have reviewed the above documentation for accuracy and completeness, and I agree with the above. -  Georgianne Fick, FNP

## 2020-02-10 ENCOUNTER — Other Ambulatory Visit: Payer: Self-pay | Admitting: Family

## 2020-02-15 ENCOUNTER — Ambulatory Visit (INDEPENDENT_AMBULATORY_CARE_PROVIDER_SITE_OTHER): Payer: 59 | Admitting: Family Medicine

## 2020-02-15 ENCOUNTER — Other Ambulatory Visit: Payer: Self-pay

## 2020-02-15 ENCOUNTER — Encounter (INDEPENDENT_AMBULATORY_CARE_PROVIDER_SITE_OTHER): Payer: Self-pay | Admitting: Family Medicine

## 2020-02-15 VITALS — BP 108/66 | HR 81 | Temp 97.9°F | Ht 64.0 in | Wt 230.0 lb

## 2020-02-15 DIAGNOSIS — Z6839 Body mass index (BMI) 39.0-39.9, adult: Secondary | ICD-10-CM

## 2020-02-15 DIAGNOSIS — I10 Essential (primary) hypertension: Secondary | ICD-10-CM

## 2020-02-15 NOTE — Progress Notes (Signed)
Chief Complaint:   OBESITY [name] is here to discuss her progress with her obesity treatment plan along with follow-up of her obesity related diagnoses. [name] is on the Category 3 Plan and states she is following her eating plan approximately 87% of the time. [name] states she is walking 60 minutes 3 times per week.  Today's visit was #: 51 Starting weight: 250 lbs Starting date: 09/08/2018 Today's weight: 230 lbs Today's date: 02/15/2020 Total lbs lost to date: 20 Total lbs lost since last in-office visit: 2  Interim History: [name] reports sticking to the plan pretty well. She is meal prepping her lunch when working. She works nights. She reports eating regular sausage versus [place] sausage. She does deviate from the plan in other small ways. She is on Saxenda 0.6 mg daily and notes hunger is controlled.  Subjective:   Essential hypertension. Blood pressure is well controlled at 108/66 today. [name] is on Zestoretic. No chest pain or shortness of breath.  BP Readings from Last 3 Encounters:  02/15/20 108/66  01/25/20 108/71  01/04/20 100/67   Lab Results  Component Value Date   CREATININE 0.79 12/21/2019   CREATININE 0.84 08/04/2019   CREATININE 0.90 07/20/2019   Assessment/Plan:   Essential hypertension. [name] is working on healthy weight loss and exercise to improve blood pressure control. We will watch for signs of hypotension as she continues her lifestyle modifications. She will continue her medication as directed.  Class 2 severe obesity with serious comorbidity and body mass index (BMI) of 39.0 to 39.9 in adult, unspecified obesity type ([place]). [name] will continue Saxenda 0.6 mg daily.  [name] is currently in the action stage of change. As such, her goal is to continue with weight loss efforts. She has agreed to the Category 3 Plan.  She will choose lean cuts of meat.  Exercise goals: [name] will continue her current exercise regimen. She plans on starting back to  the gym.  Behavioral modification strategies: increasing lean protein intake and decreasing simple carbohydrates.  [name] has agreed to follow-up with our clinic in 3 weeks. She was informed of the importance of frequent follow-up visits to maximize her success with intensive lifestyle modifications for her multiple health conditions.   Objective:   Blood pressure 108/66, pulse 81, temperature 97.9 F (36.6 C), temperature source Oral, height 5\' 4"  (1.626 m), weight 230 lb (104.3 kg), SpO2 98 %. Body mass index is 39.48 kg/m.  General: Cooperative, alert, well developed, in no acute distress. HEENT: Conjunctivae and lids unremarkable. Cardiovascular: Regular rhythm.  Lungs: Normal work of breathing. Neurologic: No focal deficits.   Lab Results  Component Value Date   CREATININE 0.79 12/21/2019   BUN 17 12/21/2019   NA 140 12/21/2019   K 3.9 12/21/2019   CL 109 (H) 12/21/2019   CO2 17 (L) 12/21/2019   Lab Results  Component Value Date   ALT 12 12/21/2019   AST 11 12/21/2019   ALKPHOS 47 12/21/2019   BILITOT 0.3 12/21/2019   Lab Results  Component Value Date   HGBA1C 5.1 12/21/2019   HGBA1C 5.1 05/04/2019   HGBA1C 5.1 01/29/2019   HGBA1C 5.2 09/08/2018   Lab Results  Component Value Date   INSULIN 22.1 12/21/2019   INSULIN 13.2 05/04/2019   INSULIN 16.1 01/29/2019   INSULIN 16.9 09/08/2018   Lab Results  Component Value Date   TSH 2.620 08/04/2019   Lab Results  Component Value Date   CHOL 167 08/04/2019  HDL 56 08/04/2019   LDLCALC 93 08/04/2019   TRIG 98 08/04/2019   CHOLHDL 3.0 08/04/2019   Lab Results  Component Value Date   WBC 8.5 08/04/2019   HGB 14.5 08/04/2019   HCT 43.5 08/04/2019   MCV 90 08/04/2019   PLT 212 08/04/2019   No results found for: IRON, TIBC, FERRITIN  Attestation Statements:   Reviewed by clinician on day of visit: allergies, medications, problem list, medical history, surgical history, family history, social history, and  previous encounter notes.  IMichaelene Song, am acting as Location manager for [employer], FNP   I have reviewed the above documentation for accuracy and completeness, and I agree with the above. -  Georgianne Fick, FNP

## 2020-02-16 ENCOUNTER — Encounter (INDEPENDENT_AMBULATORY_CARE_PROVIDER_SITE_OTHER): Payer: Self-pay | Admitting: Family Medicine

## 2020-03-03 ENCOUNTER — Other Ambulatory Visit: Payer: Self-pay | Admitting: Family

## 2020-03-03 DIAGNOSIS — Z1231 Encounter for screening mammogram for malignant neoplasm of breast: Secondary | ICD-10-CM

## 2020-03-07 MED FILL — MONTELUKAST SOD 10 MG TAB: 10 | 90 days supply | Qty: 90 | Fill #2

## 2020-03-14 ENCOUNTER — Ambulatory Visit (INDEPENDENT_AMBULATORY_CARE_PROVIDER_SITE_OTHER): Payer: 59 | Admitting: Family Medicine

## 2020-03-14 ENCOUNTER — Other Ambulatory Visit: Payer: Self-pay

## 2020-03-14 ENCOUNTER — Encounter (INDEPENDENT_AMBULATORY_CARE_PROVIDER_SITE_OTHER): Payer: Self-pay | Admitting: Family Medicine

## 2020-03-14 VITALS — BP 96/65 | HR 83 | Temp 98.0°F | Ht 64.0 in | Wt 229.0 lb

## 2020-03-14 DIAGNOSIS — E8881 Metabolic syndrome: Secondary | ICD-10-CM | POA: Diagnosis not present

## 2020-03-14 DIAGNOSIS — Z6839 Body mass index (BMI) 39.0-39.9, adult: Secondary | ICD-10-CM | POA: Diagnosis not present

## 2020-03-14 DIAGNOSIS — I10 Essential (primary) hypertension: Secondary | ICD-10-CM

## 2020-03-16 ENCOUNTER — Encounter (INDEPENDENT_AMBULATORY_CARE_PROVIDER_SITE_OTHER): Payer: Self-pay | Admitting: Family Medicine

## 2020-03-16 NOTE — Progress Notes (Signed)
Chief Complaint:   OBESITY [name] is here to discuss her progress with her obesity treatment plan along with follow-up of her obesity related diagnoses. [name] is on the Category 3 Plan and states she is following her eating plan approximately 60-65% of the time. [name] states she is walking for 30-40 minutes 7 times per week.  Today's visit was #: 30 Starting weight: 250 lbs Starting date: 09/08/2018 Today's weight: 229 lbs Today's date: 03/14/2020 Total lbs lost to date: 21 Total lbs lost since last in-office visit: 1  Interim History: [name] has been ill with bronchitis and is not eating enough protein due to sore throat. She is now back on the plan. She is on Saxenda 0.6 mg and it still provides good appetite suppression.  Subjective:   1. Insulin resistance [name] has a diagnosis of insulin resistance based on her elevated fasting insulin level >5. She is not on metformin, and she denies polyphagia (on Saxenda). She continues to work on diet and exercise to decrease her risk of diabetes.  Lab Results  Component Value Date   INSULIN 22.1 12/21/2019   INSULIN 13.2 05/04/2019   INSULIN 16.1 01/29/2019   INSULIN 16.9 09/08/2018   Lab Results  Component Value Date   HGBA1C 5.1 12/21/2019   2. Essential hypertension [name]'s blood pressure is low today at 96/95. She is on lisinopril-hydrochlorothiazide 20-12.5 mg daily.   BP Readings from Last 3 Encounters:  03/14/20 96/65  02/15/20 108/66  01/25/20 108/71   Lab Results  Component Value Date   CREATININE 0.79 12/21/2019   CREATININE 0.84 08/04/2019   CREATININE 0.90 07/20/2019   Assessment/Plan:   1. Insulin resistance [name] will continue her meal plan and Saxenda, and will continue to work on weight loss, exercise, and decreasing simple carbohydrates to help decrease the risk of diabetes. [name] agreed to follow-up with [name] as directed to closely monitor her progress.  2. Essential hypertension [name] is working on healthy  weight loss and exercise to improve blood pressure control. We will continue to monitor, and will watch for signs of hypotension as she continues her lifestyle modifications.  3. Class 2 severe obesity with serious comorbidity and body mass index (BMI) of 39.0 to 39.9 in adult, unspecified obesity type (HCC) [name] is currently in the action stage of change. As such, her goal is to continue with weight loss efforts. She has agreed to the Category 3 Plan.   We discussed various medication options to help [name] with her weight loss efforts and we both agreed to continues Saxenda at 0.6 mg daily.  Exercise goals: [name] will start resistance bands 2 times per week.  Behavioral modification strategies: increasing lean protein intake, meal planning and cooking strategies and planning for success.  [name] has agreed to follow-up with our clinic in 3 weeks. She was informed of the importance of frequent follow-up visits to maximize her success with intensive lifestyle modifications for her multiple health conditions.   Objective:   Blood pressure 96/65, pulse 83, temperature 98 F (36.7 C), temperature source Oral, height 5\' 4"  (1.626 m), weight 229 lb (103.9 kg), last menstrual period 02/16/2020, SpO2 92 %. Body mass index is 39.31 kg/m.  General: Cooperative, alert, well developed, in no acute distress. HEENT: Conjunctivae and lids unremarkable. Cardiovascular: Regular rhythm.  Lungs: Normal work of breathing. Neurologic: No focal deficits.   Lab Results  Component Value Date   CREATININE 0.79 12/21/2019   BUN 17 12/21/2019   NA 140 12/21/2019  K 3.9 12/21/2019   CL 109 (H) 12/21/2019   CO2 17 (L) 12/21/2019   Lab Results  Component Value Date   ALT 12 12/21/2019   AST 11 12/21/2019   ALKPHOS 47 12/21/2019   BILITOT 0.3 12/21/2019   Lab Results  Component Value Date   HGBA1C 5.1 12/21/2019   HGBA1C 5.1 05/04/2019   HGBA1C 5.1 01/29/2019   HGBA1C 5.2 09/08/2018   Lab Results    Component Value Date   INSULIN 22.1 12/21/2019   INSULIN 13.2 05/04/2019   INSULIN 16.1 01/29/2019   INSULIN 16.9 09/08/2018   Lab Results  Component Value Date   TSH 2.620 08/04/2019   Lab Results  Component Value Date   CHOL 167 08/04/2019   HDL 56 08/04/2019   LDLCALC 93 08/04/2019   TRIG 98 08/04/2019   CHOLHDL 3.0 08/04/2019   Lab Results  Component Value Date   WBC 8.5 08/04/2019   HGB 14.5 08/04/2019   HCT 43.5 08/04/2019   MCV 90 08/04/2019   PLT 212 08/04/2019   No results found for: IRON, TIBC, FERRITIN  Attestation Statements:   Reviewed by clinician on day of visit: allergies, medications, problem list, medical history, surgical history, family history, social history, and previous encounter notes.   Wilhemena Durie, am acting as Location manager for [employer], FNP-C.  I have reviewed the above documentation for accuracy and completeness, and I agree with the above. -  Georgianne Fick, FNP

## 2020-03-30 ENCOUNTER — Ambulatory Visit
Admission: RE | Admit: 2020-03-30 | Discharge: 2020-03-30 | Disposition: A | Discharge status: Home / Self Care | Payer: 59 | Source: Ambulatory Visit | Attending: Family | Admitting: Family

## 2020-03-30 ENCOUNTER — Other Ambulatory Visit: Payer: Self-pay

## 2020-03-30 DIAGNOSIS — Z1231 Encounter for screening mammogram for malignant neoplasm of breast: Secondary | ICD-10-CM | POA: Diagnosis not present

## 2020-04-04 ENCOUNTER — Ambulatory Visit (INDEPENDENT_AMBULATORY_CARE_PROVIDER_SITE_OTHER): Payer: 59 | Admitting: Family Medicine

## 2020-04-04 ENCOUNTER — Encounter (INDEPENDENT_AMBULATORY_CARE_PROVIDER_SITE_OTHER): Payer: Self-pay | Admitting: Family Medicine

## 2020-04-04 ENCOUNTER — Other Ambulatory Visit: Payer: Self-pay

## 2020-04-04 VITALS — BP 114/75 | HR 73 | Temp 97.7°F | Ht 64.0 in | Wt 229.0 lb

## 2020-04-04 DIAGNOSIS — G932 Benign intracranial hypertension: Secondary | ICD-10-CM | POA: Diagnosis not present

## 2020-04-04 DIAGNOSIS — E66812 Obesity, class 2: Secondary | ICD-10-CM

## 2020-04-04 DIAGNOSIS — Z6839 Body mass index (BMI) 39.0-39.9, adult: Secondary | ICD-10-CM | POA: Diagnosis not present

## 2020-04-04 NOTE — Progress Notes (Signed)
Chief Complaint:   OBESITY [name] is here to discuss her progress with her obesity treatment plan along with follow-up of her obesity related diagnoses. [name] is on the Category 3 Plan and states she is following her eating plan approximately 80% of the time. [name] states she is walking, doing planks, crunches, and bands for 50 minutes 5-7 times per week.  Today's visit was #: 33 Starting weight: 250 lbs Starting date: 09/08/2018 Today's weight: 229 lbs Today's date: 04/04/2020 Total lbs lost to date: 21 Total lbs lost since last in-office visit: 0  Interim History: [name] is getting in all of the prescribed protein. She does eat out every Saturday with her mom and sister, and also has dessert. She does try to make healthy choices, but she feels that day sabotages her. She does drink sweet tea also when eating out.  Subjective:   1. Idiopathic intracranial hypertension [name] sees Neurology yearly. She takes Diamox daily 250 mg BID. She denies headaches or blurred vision.  Assessment/Plan:   1. Idiopathic intracranial hypertension [name] will follow up with Neurology as directed, as she has an appointment this Fall. She will continue with diet, and exercise.  2. Class 2 severe obesity with serious comorbidity and body mass index (BMI) of 39.0 to 39.9 in adult, unspecified obesity type (HCC) [name] is currently in the action stage of change. As such, her goal is to continue with weight loss efforts. She has agreed to the Category 3 Plan.   We discussed various medication options to help [name] with her weight loss efforts and we both agreed to increase Saxenda to 1.2 mg SubQ daily.  Exercise goals: As is.  Behavioral modification strategies: decreasing simple carbohydrates, decreasing liquid calories, decreasing eating out, meal planning and cooking strategies and dealing with family or coworker sabotage.  [name] has agreed to follow-up with our clinic in 3 weeks. She was informed of the  importance of frequent follow-up visits to maximize her success with intensive lifestyle modifications for her multiple health conditions.   Objective:   Blood pressure 114/75, pulse 73, temperature 97.7 F (36.5 C), temperature source Oral, height 5\' 4"  (1.626 m), weight (!) 229 lb (103.9 kg), last menstrual period 03/24/2020, SpO2 99 %. Body mass index is 39.31 kg/m.  General: Cooperative, alert, well developed, in no acute distress. HEENT: Conjunctivae and lids unremarkable. Cardiovascular: Regular rhythm.  Lungs: Normal work of breathing. Neurologic: No focal deficits.   Lab Results  Component Value Date   CREATININE 0.79 12/21/2019   BUN 17 12/21/2019   NA 140 12/21/2019   K 3.9 12/21/2019   CL 109 (H) 12/21/2019   CO2 17 (L) 12/21/2019   Lab Results  Component Value Date   ALT 12 12/21/2019   AST 11 12/21/2019   ALKPHOS 47 12/21/2019   BILITOT 0.3 12/21/2019   Lab Results  Component Value Date   HGBA1C 5.1 12/21/2019   HGBA1C 5.1 05/04/2019   HGBA1C 5.1 01/29/2019   HGBA1C 5.2 09/08/2018   Lab Results  Component Value Date   INSULIN 22.1 12/21/2019   INSULIN 13.2 05/04/2019   INSULIN 16.1 01/29/2019   INSULIN 16.9 09/08/2018   Lab Results  Component Value Date   TSH 2.620 08/04/2019   Lab Results  Component Value Date   CHOL 167 08/04/2019   HDL 56 08/04/2019   LDLCALC 93 08/04/2019   TRIG 98 08/04/2019   CHOLHDL 3.0 08/04/2019   Lab Results  Component Value Date   WBC  8.5 08/04/2019   HGB 14.5 08/04/2019   HCT 43.5 08/04/2019   MCV 90 08/04/2019   PLT 212 08/04/2019   No results found for: IRON, TIBC, FERRITIN  Attestation Statements:   Reviewed by clinician on day of visit: allergies, medications, problem list, medical history, surgical history, family history, social history, and previous encounter notes.   Wilhemena Durie, am acting as Location manager for [employer], FNP-C.  I have reviewed the above documentation for accuracy  and completeness, and I agree with the above. -  Georgianne Fick, FNP

## 2020-04-05 ENCOUNTER — Encounter (INDEPENDENT_AMBULATORY_CARE_PROVIDER_SITE_OTHER): Payer: Self-pay | Admitting: Family Medicine

## 2020-04-11 MED FILL — LISINOPRIL-HCTZ 20-12.5 MG: 20-12.5 | 90 days supply | Qty: 90 | Fill #2

## 2020-04-25 ENCOUNTER — Ambulatory Visit (INDEPENDENT_AMBULATORY_CARE_PROVIDER_SITE_OTHER): Payer: 59 | Admitting: Family Medicine

## 2020-04-25 ENCOUNTER — Encounter (INDEPENDENT_AMBULATORY_CARE_PROVIDER_SITE_OTHER): Payer: Self-pay | Admitting: Family Medicine

## 2020-04-25 ENCOUNTER — Other Ambulatory Visit: Payer: Self-pay

## 2020-04-25 VITALS — BP 109/72 | HR 83 | Temp 98.1°F | Ht 64.0 in | Wt 230.0 lb

## 2020-04-25 DIAGNOSIS — I10 Essential (primary) hypertension: Secondary | ICD-10-CM

## 2020-04-25 DIAGNOSIS — Z6839 Body mass index (BMI) 39.0-39.9, adult: Secondary | ICD-10-CM | POA: Diagnosis not present

## 2020-04-25 DIAGNOSIS — E8881 Metabolic syndrome: Secondary | ICD-10-CM

## 2020-04-25 MED ORDER — SAXENDA 18 MG/3ML ~~LOC~~ SOPN: 3.0000 mg | PEN_INJECTOR | Freq: Every day | SUBCUTANEOUS | 0 refills | Status: DC

## 2020-04-25 MED FILL — SAXENDA 18 MG/3 ML PEN: 18 | 30 days supply | Qty: 15 | Fill #0

## 2020-04-25 NOTE — Progress Notes (Signed)
Chief Complaint:   OBESITY [name] is here to discuss her progress with her obesity treatment plan along with follow-up of her obesity related diagnoses. [name] is on the Category 3 Plan and states she is following her eating plan approximately 80% of the time. [name] states she is walking and doing regular exercise for 10-20 minutes 3-4 times per week.  Today's visit was #: 72 Starting weight: 250 lbs Starting date: 09/08/2018 Today's weight: 230 lbs Today's date: 04/25/2020 Total lbs lost to date: 20 Total lbs lost since last in-office visit: 0  Interim History: [name]'s Saxenda dose was increased to 1.2 mg at her last office visit. She notes she can tell if she misses a dose because she is hungrier. She denies side effects from [name]. She is unable to get meals in at work consistently. She does not always get in snack calories on work days.   Subjective:   1. Essential hypertension [name]'s blood pressure is well controlled on lisinopril-hydrochlorothiazide. Cardiovascular ROS: no chest pain or dyspnea on exertion.  BP Readings from Last 3 Encounters:  04/25/20 109/72  04/04/20 114/75  03/14/20 96/65   Lab Results  Component Value Date   CREATININE 0.79 12/21/2019   CREATININE 0.84 08/04/2019   CREATININE 0.90 07/20/2019   2. Insulin resistance [name] has a diagnosis of insulin resistance based on her elevated fasting insulin level >5. She is not on metformin. She is on Saxenda for polyphagia. She continues to work on diet and exercise to decrease her risk of diabetes.  Lab Results  Component Value Date   INSULIN 22.1 12/21/2019   INSULIN 13.2 05/04/2019   INSULIN 16.1 01/29/2019   INSULIN 16.9 09/08/2018   Lab Results  Component Value Date   HGBA1C 5.1 12/21/2019   Assessment/Plan:   1. Essential hypertension Oriel will continue lisinopril-hydrochlorothiazide, and will continue working on healthy weight loss and exercise to improve blood pressure control. We will watch  for signs of hypotension as she continues her lifestyle modifications.  2. Insulin resistance [name] will continue Saxenda and will continue to work on weight loss, exercise, and decreasing simple carbohydrates to help decrease the risk of diabetes. [name] agreed to follow-up with [name] as directed to closely monitor her progress.  3. Class 2 severe obesity with serious comorbidity and body mass index (BMI) of 39.0 to 39.9 in adult, unspecified obesity type (HCC) [name] is currently in the action stage of change. As such, her goal is to continue with weight loss efforts. She has agreed to the Category 3 Plan.   We discussed various medication options to help [name] with her weight loss efforts and we both agreed to continue Saxenda and will refill for 1 month (she is to take 1.2 mg daily).  - Liraglutide -Weight Management (SAXENDA) 18 MG/3ML SOPN; Inject 0.5 mLs (3 mg total) into the skin daily.  Dispense: 15 mL; Refill: 0  Exercise goals: As is.  Behavioral modification strategies: increasing lean protein intake.  [name] has agreed to follow-up with our clinic in 3 weeks. She was informed of the importance of frequent follow-up visits to maximize her success with intensive lifestyle modifications for her multiple health conditions.   Objective:   Blood pressure 109/72, pulse 83, temperature 98.1 F (36.7 C), temperature source Oral, height 5\' 4"  (1.626 m), weight 230 lb (104.3 kg), SpO2 98 %. Body mass index is 39.48 kg/m.  General: Cooperative, alert, well developed, in no acute distress. HEENT: Conjunctivae and lids unremarkable. Cardiovascular: Regular  rhythm.  Lungs: Normal work of breathing. Neurologic: No focal deficits.   Lab Results  Component Value Date   CREATININE 0.79 12/21/2019   BUN 17 12/21/2019   NA 140 12/21/2019   K 3.9 12/21/2019   CL 109 (H) 12/21/2019   CO2 17 (L) 12/21/2019   Lab Results  Component Value Date   ALT 12 12/21/2019   AST 11 12/21/2019   ALKPHOS 47  12/21/2019   BILITOT 0.3 12/21/2019   Lab Results  Component Value Date   HGBA1C 5.1 12/21/2019   HGBA1C 5.1 05/04/2019   HGBA1C 5.1 01/29/2019   HGBA1C 5.2 09/08/2018   Lab Results  Component Value Date   INSULIN 22.1 12/21/2019   INSULIN 13.2 05/04/2019   INSULIN 16.1 01/29/2019   INSULIN 16.9 09/08/2018   Lab Results  Component Value Date   TSH 2.620 08/04/2019   Lab Results  Component Value Date   CHOL 167 08/04/2019   HDL 56 08/04/2019   LDLCALC 93 08/04/2019   TRIG 98 08/04/2019   CHOLHDL 3.0 08/04/2019   Lab Results  Component Value Date   WBC 8.5 08/04/2019   HGB 14.5 08/04/2019   HCT 43.5 08/04/2019   MCV 90 08/04/2019   PLT 212 08/04/2019   No results found for: IRON, TIBC, FERRITIN  Attestation Statements:   Reviewed by clinician on day of visit: allergies, medications, problem list, medical history, surgical history, family history, social history, and previous encounter notes.   Wilhemena Durie, am acting as Location manager for [employer], FNP-C.  I have reviewed the above documentation for accuracy and completeness, and I agree with the above. -  Georgianne Fick, FNP

## 2020-04-26 ENCOUNTER — Encounter (INDEPENDENT_AMBULATORY_CARE_PROVIDER_SITE_OTHER): Payer: Self-pay | Admitting: Family Medicine

## 2020-05-03 ENCOUNTER — Other Ambulatory Visit (INDEPENDENT_AMBULATORY_CARE_PROVIDER_SITE_OTHER): Payer: Self-pay | Admitting: Family Medicine

## 2020-05-03 MED FILL — UNIFINE PENTIPS 32GX5/32: 32G X 4 MM | 90 days supply | Qty: 100 | Fill #0

## 2020-05-10 MED FILL — POTASSIUM CHLORIDE CRYS ER: 10 | 90 days supply | Qty: 90 | Fill #1

## 2020-05-17 ENCOUNTER — Other Ambulatory Visit: Payer: Self-pay | Admitting: *Deleted

## 2020-05-17 MED ORDER — ACETAZOLAMIDE 250 MG PO TABS: 500.0000 mg | ORAL_TABLET | Freq: Two times a day (BID) | ORAL | 0 refills | Status: DC

## 2020-05-17 MED FILL — acetaZOLAMIDE 250 MG TABS: 250 | 90 days supply | Qty: 360 | Fill #0

## 2020-05-23 ENCOUNTER — Ambulatory Visit (INDEPENDENT_AMBULATORY_CARE_PROVIDER_SITE_OTHER): Payer: 59 | Admitting: Family Medicine

## 2020-05-23 ENCOUNTER — Other Ambulatory Visit: Payer: Self-pay

## 2020-05-23 VITALS — BP 103/69 | HR 90 | Temp 98.1°F | Ht 64.0 in | Wt 226.0 lb

## 2020-05-23 DIAGNOSIS — I10 Essential (primary) hypertension: Secondary | ICD-10-CM | POA: Diagnosis not present

## 2020-05-23 DIAGNOSIS — Z6838 Body mass index (BMI) 38.0-38.9, adult: Secondary | ICD-10-CM | POA: Diagnosis not present

## 2020-05-24 ENCOUNTER — Encounter (INDEPENDENT_AMBULATORY_CARE_PROVIDER_SITE_OTHER): Payer: Self-pay | Admitting: Family Medicine

## 2020-05-24 NOTE — Progress Notes (Signed)
Chief Complaint:   OBESITY [name] is here to discuss her progress with her obesity treatment plan along with follow-up of her obesity related diagnoses. [name] is on the Category 3 Plan and states she is following her eating plan approximately 80% of the time. [name] states she is walking for 120 minutes 3 times per week.  Today's visit was #: 63 Starting weight: 250 lbs Starting date: 09/08/2018 Today's weight: 226 lbs Today's date: 05/23/2020 Total lbs lost to date: 24 Total lbs lost since last in-office visit: 4  Interim History: [name] has been on vacation but she is down 4 lbs. She feels this is due to hiking on her trip. She notes Saxenda 1.2 mg is suppressing her appetite well.  Subjective:   1. Essential hypertension [name]'s blood pressure is low today. She notes her heart feels fluttery occasionally. She has a family history of aFib. She feels fluttering worsens with her period. Cardiovascular ROS: no chest pain or dyspnea on exertion.  BP Readings from Last 3 Encounters:  05/23/20 103/69  04/25/20 109/72  04/04/20 114/75   Lab Results  Component Value Date   CREATININE 0.79 12/21/2019   CREATININE 0.84 08/04/2019   CREATININE 0.90 07/20/2019   Assessment/Plan:   1. Essential hypertension  [name] will continue to monitor her blood pressure. We may need to decrease dose of her blood pressure medications. She will see her primary care provider if fluttering continues.  2. Class 2 severe obesity with serious comorbidity and body mass index (BMI) of 38.0 to 38.9 in adult, unspecified obesity type (HCC) [name] is currently in the action stage of change. As such, her goal is to continue with weight loss efforts. She has agreed to the Category 3 Plan.   We discussed various medication options to help [name] with her weight loss efforts and we both agreed to continue Saxenda at 1.2 mg SubQ daily.  Exercise goals: As is.  Behavioral modification strategies: decreasing simple  carbohydrates and planning for success.  [name] has agreed to follow-up with our clinic in 3 weeks.  Objective:   Blood pressure 103/69, pulse 90, temperature 98.1 F (36.7 C), height 5\' 4"  (1.626 m), weight 226 lb (102.5 kg), SpO2 98 %. Body mass index is 38.79 kg/m.  General: Cooperative, alert, well developed, in no acute distress. HEENT: Conjunctivae and lids unremarkable. Cardiovascular: Regular rhythm.  Lungs: Normal work of breathing. Neurologic: No focal deficits.   Lab Results  Component Value Date   CREATININE 0.79 12/21/2019   BUN 17 12/21/2019   NA 140 12/21/2019   K 3.9 12/21/2019   CL 109 (H) 12/21/2019   CO2 17 (L) 12/21/2019   Lab Results  Component Value Date   ALT 12 12/21/2019   AST 11 12/21/2019   ALKPHOS 47 12/21/2019   BILITOT 0.3 12/21/2019   Lab Results  Component Value Date   HGBA1C 5.1 12/21/2019   HGBA1C 5.1 05/04/2019   HGBA1C 5.1 01/29/2019   HGBA1C 5.2 09/08/2018   Lab Results  Component Value Date   INSULIN 22.1 12/21/2019   INSULIN 13.2 05/04/2019   INSULIN 16.1 01/29/2019   INSULIN 16.9 09/08/2018   Lab Results  Component Value Date   TSH 2.620 08/04/2019   Lab Results  Component Value Date   CHOL 167 08/04/2019   HDL 56 08/04/2019   LDLCALC 93 08/04/2019   TRIG 98 08/04/2019   CHOLHDL 3.0 08/04/2019   Lab Results  Component Value Date   WBC 8.5 08/04/2019  HGB 14.5 08/04/2019   HCT 43.5 08/04/2019   MCV 90 08/04/2019   PLT 212 08/04/2019   No results found for: IRON, TIBC, FERRITIN  Attestation Statements:   Reviewed by clinician on day of visit: allergies, medications, problem list, medical history, surgical history, family history, social history, and previous encounter notes.   Wilhemena Durie, am acting as Location manager for [employer], FNP-C.  I have reviewed the above documentation for accuracy and completeness, and I agree with the above. -  Georgianne Fick, FNP

## 2020-06-13 ENCOUNTER — Encounter (INDEPENDENT_AMBULATORY_CARE_PROVIDER_SITE_OTHER): Payer: Self-pay | Admitting: Family Medicine

## 2020-06-13 ENCOUNTER — Other Ambulatory Visit: Payer: Self-pay

## 2020-06-13 ENCOUNTER — Ambulatory Visit (INDEPENDENT_AMBULATORY_CARE_PROVIDER_SITE_OTHER): Payer: 59 | Admitting: Family Medicine

## 2020-06-13 VITALS — BP 130/77 | HR 86 | Temp 98.1°F | Ht 64.0 in | Wt 228.0 lb

## 2020-06-13 DIAGNOSIS — E8881 Metabolic syndrome: Secondary | ICD-10-CM | POA: Diagnosis not present

## 2020-06-13 DIAGNOSIS — R002 Palpitations: Secondary | ICD-10-CM

## 2020-06-13 DIAGNOSIS — E559 Vitamin D deficiency, unspecified: Secondary | ICD-10-CM | POA: Diagnosis not present

## 2020-06-13 DIAGNOSIS — E876 Hypokalemia: Secondary | ICD-10-CM

## 2020-06-13 DIAGNOSIS — E88819 Insulin resistance, unspecified: Secondary | ICD-10-CM

## 2020-06-13 DIAGNOSIS — Z9189 Other specified personal risk factors, not elsewhere classified: Secondary | ICD-10-CM

## 2020-06-13 DIAGNOSIS — Z6839 Body mass index (BMI) 39.0-39.9, adult: Secondary | ICD-10-CM

## 2020-06-13 MED FILL — MONTELUKAST SOD 10 MG TAB: 10 | 90 days supply | Qty: 90 | Fill #3

## 2020-06-13 NOTE — Progress Notes (Signed)
Chief Complaint:   OBESITY [name] is here to discuss her progress with her obesity treatment plan along with follow-up of her obesity related diagnoses. [name] is on the Category 3 Plan and states she is following her eating plan approximately 85% of the time. [name] states she is walking for 30-45 minutes 6 times per week.  Today's visit was #: 25 Starting weight: 250 lbs Starting date: 09/08/2018 Today's weight: 228 lbs Today's date: 06/13/2020 Total lbs lost to date: 22 Total lbs lost since last in-office visit: 0  Interim History: [name] is up 2 lbs today, but she notes she is pre-menstrual. She is mostly getting her protein in except when her husband makes her sandwich. She is stable on Saxenda 1.2 mg daily, and she denies constipation or nausea.  Subjective:   1. Palpitations [name]'s palpitations are will controlled, though her blood pressure is higher than usual today. She still occasionally has "flutters" in her heart. She notes this happens when her potassium is low.  Her father has aFib. She notes fatigue.  2. Hypokalemia [name] has a history of low potassium. She is on Klor con 10 meq daily.  3. Vitamin D deficiency [name]'s last Vit D level was at goal. She is on 2,000 IU OTC Vit D daily.  4. Insulin resistance [name] has a diagnosis of insulin resistance based on her elevated fasting insulin level >5. Her polyphagia is controlled with Saxenda.   Lab Results  Component Value Date   INSULIN 22.1 12/21/2019   INSULIN 13.2 05/04/2019   INSULIN 16.1 01/29/2019   INSULIN 16.9 09/08/2018   Lab Results  Component Value Date   HGBA1C 5.1 12/21/2019   5. At risk for heart disease [name] is at a higher than average risk for cardiovascular disease due to obesity, family history, and hypertension.   Assessment/Plan:   1. Palpitations We will check labs today. [place] will follow up with her primary care provider about her "flutter".  - Comprehensive metabolic panel - Thyroid Panel  With TSH  2. Hypokalemia We will check labs today. [name] will continue Klor con 10 meq daily.   - Comprehensive metabolic panel  3. Vitamin D deficiency Low Vitamin D level contributes to fatigue and are associated with obesity, breast, and colon cancer. We will check labs today. [name] agreed to continue taking OTC Vitamin D 2,000 IU daily and will follow-up for routine testing of Vitamin D, at least 2-3 times per year to avoid over-replacement.  - VITAMIN D 25 Hydroxy (Vit-D Deficiency, Fractures)  4. Insulin resistance [name] will continue to work on weight loss, exercise, and decreasing simple carbohydrates to help decrease the risk of diabetes. We will check labs today. [name] agreed to follow-up with [name] as directed to closely monitor her progress.  - Hemoglobin A1c - Insulin, random  5. At risk for heart disease [name] was given approximately 15 minutes of coronary artery disease prevention counseling today. She is 49 y.o. female and has risk factors for heart disease including obesity. We discussed intensive lifestyle modifications today with an emphasis on specific weight loss instructions and strategies.   Repetitive spaced learning was employed today to elicit superior memory formation and behavioral change.  6. Class 2 severe obesity with serious comorbidity and body mass index (BMI) of 39.0 to 39.9 in adult, unspecified obesity type (HCC) [name] is currently in the action stage of change. As such, her goal is to continue with weight loss efforts. She has agreed to the Category  3 Plan.   We discussed various medication options to help [name] with her weight loss efforts and we both agreed to continue Saxenda at 1.2 mg SubQ daily.  Exercise goals: As is.  Behavioral modification strategies: increasing lean protein intake.  [name] has agreed to follow-up with our clinic in 3 weeks. [name] was informed we would discuss her lab results at her next visit unless there is a critical issue that  needs to be addressed sooner. [name] agreed to keep her next visit at the agreed upon time to discuss these results.  Objective:   Blood pressure 130/77, pulse 86, temperature 98.1 F (36.7 C), height 5\' 4"  (1.626 m), weight 228 lb (103.4 kg), last menstrual period 05/17/2020, SpO2 100 %. Body mass index is 39.14 kg/m.  General: Cooperative, alert, well developed, in no acute distress. HEENT: Conjunctivae and lids unremarkable. Cardiovascular: Regular rhythm.  Lungs: Normal work of breathing. Neurologic: No focal deficits.   Lab Results  Component Value Date   CREATININE 0.79 12/21/2019   BUN 17 12/21/2019   NA 140 12/21/2019   K 3.9 12/21/2019   CL 109 (H) 12/21/2019   CO2 17 (L) 12/21/2019   Lab Results  Component Value Date   ALT 12 12/21/2019   AST 11 12/21/2019   ALKPHOS 47 12/21/2019   BILITOT 0.3 12/21/2019   Lab Results  Component Value Date   HGBA1C 5.1 12/21/2019   HGBA1C 5.1 05/04/2019   HGBA1C 5.1 01/29/2019   HGBA1C 5.2 09/08/2018   Lab Results  Component Value Date   INSULIN 22.1 12/21/2019   INSULIN 13.2 05/04/2019   INSULIN 16.1 01/29/2019   INSULIN 16.9 09/08/2018   Lab Results  Component Value Date   TSH 2.620 08/04/2019   Lab Results  Component Value Date   CHOL 167 08/04/2019   HDL 56 08/04/2019   LDLCALC 93 08/04/2019   TRIG 98 08/04/2019   CHOLHDL 3.0 08/04/2019   Lab Results  Component Value Date   WBC 8.5 08/04/2019   HGB 14.5 08/04/2019   HCT 43.5 08/04/2019   MCV 90 08/04/2019   PLT 212 08/04/2019   No results found for: IRON, TIBC, FERRITIN  Attestation Statements:   Reviewed by clinician on day of visit: allergies, medications, problem list, medical history, surgical history, family history, social history, and previous encounter notes.   Wilhemena Durie, am acting as Location manager for [employer], FNP-C.  I have reviewed the above documentation for accuracy and completeness, and I agree with the above. -  Georgianne Fick, FNP

## 2020-06-14 ENCOUNTER — Encounter (INDEPENDENT_AMBULATORY_CARE_PROVIDER_SITE_OTHER): Payer: Self-pay | Admitting: Family Medicine

## 2020-06-14 LAB — THYROID PANEL WITH TSH
Free Thyroxine Index: 1.8 (ref 1.2–4.9)
T3 Uptake Ratio: 26 % (ref 24–39)
T4, Total: 6.9 ug/dL (ref 4.5–12.0)
TSH: 2.76 u[IU]/mL (ref 0.450–4.500)

## 2020-06-14 LAB — COMPREHENSIVE METABOLIC PANEL
ALT: 16 IU/L (ref 0–32)
AST: 12 IU/L (ref 0–40)
Albumin/Globulin Ratio: 1.7 (ref 1.2–2.2)
Albumin: 4.3 g/dL (ref 3.8–4.8)
Alkaline Phosphatase: 48 IU/L (ref 44–121)
BUN/Creatinine Ratio: 19 (ref 9–23)
BUN: 15 mg/dL (ref 6–24)
Bilirubin Total: 0.4 mg/dL (ref 0.0–1.2)
CO2: 18 mmol/L — ABNORMAL LOW (ref 20–29)
Calcium: 9 mg/dL (ref 8.7–10.2)
Chloride: 106 mmol/L (ref 96–106)
Creatinine, Ser: 0.79 mg/dL (ref 0.57–1.00)
GFR calc Af Amer: 103 mL/min/{1.73_m2} (ref >59)
GFR calc non Af Amer: 89 mL/min/{1.73_m2} (ref >59)
Globulin, Total: 2.6 g/dL (ref 1.5–4.5)
Glucose: 95 mg/dL (ref 65–99)
Potassium: 4 mmol/L (ref 3.5–5.2)
Sodium: 138 mmol/L (ref 134–144)
Total Protein: 6.9 g/dL (ref 6.0–8.5)

## 2020-06-14 LAB — VITAMIN D 25 HYDROXY (VIT D DEFICIENCY, FRACTURES): Vit D, 25-Hydroxy: 60.1 ng/mL (ref 30.0–100.0)

## 2020-06-14 LAB — HEMOGLOBIN A1C
Est. average glucose Bld gHb Est-mCnc: 97 mg/dL
Hgb A1c MFr Bld: 5 % (ref 4.8–5.6)

## 2020-06-14 LAB — INSULIN, RANDOM: INSULIN: 18.2 u[IU]/mL (ref 2.6–24.9)

## 2020-07-04 ENCOUNTER — Ambulatory Visit (INDEPENDENT_AMBULATORY_CARE_PROVIDER_SITE_OTHER): Payer: 59 | Admitting: Family Medicine

## 2020-07-07 ENCOUNTER — Encounter (INDEPENDENT_AMBULATORY_CARE_PROVIDER_SITE_OTHER): Payer: Self-pay

## 2020-07-11 ENCOUNTER — Ambulatory Visit (INDEPENDENT_AMBULATORY_CARE_PROVIDER_SITE_OTHER): Payer: 59 | Admitting: Adult Health

## 2020-07-11 ENCOUNTER — Encounter (INDEPENDENT_AMBULATORY_CARE_PROVIDER_SITE_OTHER): Payer: Self-pay | Admitting: Adult Health

## 2020-07-11 ENCOUNTER — Other Ambulatory Visit: Payer: Self-pay

## 2020-07-11 VITALS — BP 119/77 | HR 85 | Temp 98.6°F | Ht 64.0 in | Wt 229.0 lb

## 2020-07-11 DIAGNOSIS — Z6839 Body mass index (BMI) 39.0-39.9, adult: Secondary | ICD-10-CM | POA: Diagnosis not present

## 2020-07-11 DIAGNOSIS — E876 Hypokalemia: Secondary | ICD-10-CM

## 2020-07-11 DIAGNOSIS — R002 Palpitations: Secondary | ICD-10-CM | POA: Diagnosis not present

## 2020-07-12 DIAGNOSIS — R002 Palpitations: Secondary | ICD-10-CM | POA: Insufficient documentation

## 2020-07-12 NOTE — Progress Notes (Signed)
Chief Complaint:   [name] is here to discuss her progress with her Kylie treatment plan along with follow-up of her Kylie related diagnoses. [name] is on the Category 3 Plan and states she is following her eating plan approximately 85% of the time. [name] states she is doing sit-ups/planks 20 minutes 3 times per week.  Today's visit was #: 87 Starting weight: 250 lbs Starting date: 09/08/2018 Today's weight: 229 lbs Today's date: 07/11/2020 Total lbs lost to date: 21 Total lbs lost since last in-office visit: 0  Interim History: [name] continues to enjoy the foods and structure of the Category 3 meal plan. She will struggle to remember to take Saxenda daily. When she takes GLP-1, she denies GI upset.   She also denies mass in neck, dysphagia, dyspepsia, or persistent hoarseness.  Subjective:   Hypokalemia. CMP on 06/13/2020 showed a potassium of 4.0. [name] is on Klor-Con 10 mEq daily.Discussed labs with pt today.  Palpitations. [name] denies increased palpitations in the last 2 weeks. Her PCP has suggested a Holter study in the past and she will discuss next month at [facility]. Her father was diagnosed with atrial fibrillation in his late 60's/early 70's.  Assessment/Plan:   Hypokalemia. Will continue to monitor. Continue daily Klor-Con 10 mEq.  Palpitations. [name] will follow-up with her PCP as scheduled OV in Dec.  Class 2 severe Kylie with serious comorbidity and body mass index (BMI) of 39.0 to 39.9 in adult, unspecified Kylie type ([name]). [name] will continue Saxenda 1.2 mg daily and will try to take daily. Recommend setting daily alarm.  [name] is currently in the action stage of change. As such, her goal is to continue with weight loss efforts. She has agreed to the Category 3 Plan.   Exercise goals: [name] will continue sit-ups/planks 20 minutes 3 times per week.  Behavioral modification strategies: increasing lean protein intake, no skipping meals, meal planning and  cooking strategies, better snacking choices and planning for success.  [name] has agreed to follow-up with our clinic in 3 weeks. She was informed of the importance of frequent follow-up visits to maximize her success with intensive lifestyle modifications for her multiple health conditions.   Objective:   Blood pressure 119/77, pulse 85, temperature 98.6 F (37 C), height 5\' 4"  (1.626 m), weight 229 lb (103.9 kg), SpO2 97 %. Body mass index is 39.31 kg/m.  General: Cooperative, alert, well developed, in no acute distress. HEENT: Conjunctivae and lids unremarkable. Cardiovascular: Regular rhythm.  Lungs: Normal work of breathing. Neurologic: No focal deficits.   Lab Results  Component Value Date   CREATININE 0.79 06/13/2020   BUN 15 06/13/2020   NA 138 06/13/2020   K 4.0 06/13/2020   CL 106 06/13/2020   CO2 18 (L) 06/13/2020   Lab Results  Component Value Date   ALT 16 06/13/2020   AST 12 06/13/2020   ALKPHOS 48 06/13/2020   BILITOT 0.4 06/13/2020   Lab Results  Component Value Date   HGBA1C 5.0 06/13/2020   HGBA1C 5.1 12/21/2019   HGBA1C 5.1 05/04/2019   HGBA1C 5.1 01/29/2019   HGBA1C 5.2 09/08/2018   Lab Results  Component Value Date   INSULIN 18.2 06/13/2020   INSULIN 22.1 12/21/2019   INSULIN 13.2 05/04/2019   INSULIN 16.1 01/29/2019   INSULIN 16.9 09/08/2018   Lab Results  Component Value Date   TSH 2.760 06/13/2020   Lab Results  Component Value Date   CHOL 167 08/04/2019  HDL 56 08/04/2019   LDLCALC 93 08/04/2019   TRIG 98 08/04/2019   CHOLHDL 3.0 08/04/2019   Lab Results  Component Value Date   WBC 8.5 08/04/2019   HGB 14.5 08/04/2019   HCT 43.5 08/04/2019   MCV 90 08/04/2019   PLT 212 08/04/2019   No results found for: IRON, TIBC, FERRITIN  Attestation Statements:   Reviewed by clinician on day of visit: allergies, medications, problem list, medical history, surgical history, family history, social history, and previous encounter  notes.  Time spent on visit including pre-visit chart review and post-visit charting and care was 29 minutes.   I, Michaelene Song, am acting as Location manager for [employer], NP-C   I have reviewed the above documentation for accuracy and completeness, and I agree with the above. -  Kazzandra Desaulniers d. Othman Masur, NP-C

## 2020-07-21 ENCOUNTER — Ambulatory Visit (INDEPENDENT_AMBULATORY_CARE_PROVIDER_SITE_OTHER): Payer: 59 | Admitting: Bariatrics

## 2020-08-01 ENCOUNTER — Encounter (INDEPENDENT_AMBULATORY_CARE_PROVIDER_SITE_OTHER): Payer: Self-pay | Admitting: Adult Health

## 2020-08-01 ENCOUNTER — Other Ambulatory Visit: Payer: Self-pay

## 2020-08-01 ENCOUNTER — Ambulatory Visit (INDEPENDENT_AMBULATORY_CARE_PROVIDER_SITE_OTHER): Payer: 59 | Admitting: Adult Health

## 2020-08-01 VITALS — BP 133/77 | HR 97 | Temp 98.0°F | Ht 64.0 in | Wt 232.0 lb

## 2020-08-01 DIAGNOSIS — I10 Essential (primary) hypertension: Secondary | ICD-10-CM

## 2020-08-01 DIAGNOSIS — Z6839 Body mass index (BMI) 39.0-39.9, adult: Secondary | ICD-10-CM | POA: Diagnosis not present

## 2020-08-01 DIAGNOSIS — E8881 Metabolic syndrome: Secondary | ICD-10-CM

## 2020-08-01 NOTE — Progress Notes (Signed)
Chief Complaint:   OBESITY [name] is here to discuss her progress with her obesity treatment plan along with follow-up of her obesity related diagnoses. [name] is on the Category 3 Plan and states she is following her eating plan approximately 80% of the time. [name] states she is walking 20 minutes 7 times per week.  Today's visit was #: 50 Starting weight: 250 lbs Starting date: 09/08/2018 Today's weight: 232 lbs Today's date: 08/01/2020 Total lbs lost to date: 18 Total lbs lost since last in-office visit: 0  Interim History: [name] has worked every night since 07/25/2020 with only 1 night off. She reports poor sleep and with a demanding work schedule - meal planning/prepping has been limited. She reports sleeping in 4 hr blocks and denies excessive fatigue/drowsiness with this pattern. She has been taking Saxenda consistently daily at about 1300/1330.  Subjective:   Essential hypertension. Blood pressure and heart rate are stable on today's office visit. [name] is on lisinopril/HCTZ 20/12.5 mg daily. She estimates 1-2 cups of coffee a day.  BP Readings from Last 3 Encounters:  08/01/20 133/77  07/11/20 119/77  06/13/20 130/77   Lab Results  Component Value Date   CREATININE 0.79 06/13/2020   CREATININE 0.79 12/21/2019   CREATININE 0.84 08/04/2019   Insulin resistance. [name] has a diagnosis of insulin resistance based on her elevated fasting insulin level >5. She continues to work on diet and exercise to decrease her risk of diabetes. 06/13/2020 blood glucose and A1c within normal limits. Insulin level 18.2. [name] is not on metformin.  She is on injectable GLP-1- tolerating well.   Lab Results  Component Value Date   INSULIN 18.2 06/13/2020   INSULIN 22.1 12/21/2019   INSULIN 13.2 05/04/2019   INSULIN 16.1 01/29/2019   INSULIN 16.9 09/08/2018   Lab Results  Component Value Date   HGBA1C 5.0 06/13/2020   Assessment/Plan:   Essential hypertension. [name] is working  on healthy weight loss and exercise to improve blood pressure control. We will watch for signs of hypotension as she continues her lifestyle modifications. She will continue lisinopril/HCTZ 20/12.5 mg daily as directed.   Insulin resistance. [name] will continue to work on weight loss, exercise, and decreasing simple carbohydrates to help decrease the risk of diabetes. [name] agreed to follow-up with [name] as directed to closely monitor her progress. She will continue to follow the Category 3 meal plan as directed and increase regular exercise.  Class 2 severe obesity with serious comorbidity and body mass index (BMI) of 39.0 to 39.9 in adult, unspecified obesity type ([place]). [name] will continue Saxenda 1.2 mg daily.  [name] is currently in the action stage of change. As such, her goal is to continue with weight loss efforts. She has agreed to the Category 3 Plan.   Exercise goals: [name] will incorporate gym exercises 2 times per week.  Behavioral modification strategies: increasing lean protein intake, decreasing simple carbohydrates, no skipping meals, meal planning and cooking strategies, better snacking choices and planning for success.  [name] has agreed to follow-up with our clinic in 3 weeks. She was informed of the importance of frequent follow-up visits to maximize her success with intensive lifestyle modifications for her multiple health conditions.   Objective:   Blood pressure 133/77, pulse 97, temperature 98 F (36.7 C), height 5\' 4"  (1.626 m), weight 232 lb (105.2 kg), SpO2 99 %. Body mass index is 39.82 kg/m.  General: Cooperative, alert, well developed, in no acute distress. HEENT: Conjunctivae  and lids unremarkable. Cardiovascular: Regular rhythm.  Lungs: Normal work of breathing. Neurologic: No focal deficits.   Lab Results  Component Value Date   CREATININE 0.79 06/13/2020   BUN 15 06/13/2020   NA 138 06/13/2020   K 4.0 06/13/2020   CL 106 06/13/2020   CO2 18 (L) 06/13/2020    Lab Results  Component Value Date   ALT 16 06/13/2020   AST 12 06/13/2020   ALKPHOS 48 06/13/2020   BILITOT 0.4 06/13/2020   Lab Results  Component Value Date   HGBA1C 5.0 06/13/2020   HGBA1C 5.1 12/21/2019   HGBA1C 5.1 05/04/2019   HGBA1C 5.1 01/29/2019   HGBA1C 5.2 09/08/2018   Lab Results  Component Value Date   INSULIN 18.2 06/13/2020   INSULIN 22.1 12/21/2019   INSULIN 13.2 05/04/2019   INSULIN 16.1 01/29/2019   INSULIN 16.9 09/08/2018   Lab Results  Component Value Date   TSH 2.760 06/13/2020   Lab Results  Component Value Date   CHOL 167 08/04/2019   HDL 56 08/04/2019   LDLCALC 93 08/04/2019   TRIG 98 08/04/2019   CHOLHDL 3.0 08/04/2019   Lab Results  Component Value Date   WBC 8.5 08/04/2019   HGB 14.5 08/04/2019   HCT 43.5 08/04/2019   MCV 90 08/04/2019   PLT 212 08/04/2019   No results found for: IRON, TIBC, FERRITIN  Attestation Statements:   Reviewed by clinician on day of visit: allergies, medications, problem list, medical history, surgical history, family history, social history, and previous encounter notes.  Time spent on visit including pre-visit chart review and post-visit charting and care was 28 minutes.   I, Michaelene Song, am acting as Location manager for [employer], NP-C   I have reviewed the above documentation for accuracy and completeness, and I agree with the above. -  Athira Janowicz d. Seniyah Esker, NP-C

## 2020-08-05 ENCOUNTER — Encounter: Payer: 59 | Admitting: Family

## 2020-08-19 ENCOUNTER — Other Ambulatory Visit: Payer: Self-pay | Admitting: Family Medicine

## 2020-08-22 ENCOUNTER — Other Ambulatory Visit: Payer: Self-pay

## 2020-08-22 ENCOUNTER — Ambulatory Visit (INDEPENDENT_AMBULATORY_CARE_PROVIDER_SITE_OTHER): Payer: 59 | Admitting: Adult Health

## 2020-08-22 ENCOUNTER — Encounter (INDEPENDENT_AMBULATORY_CARE_PROVIDER_SITE_OTHER): Payer: Self-pay | Admitting: Adult Health

## 2020-08-22 VITALS — BP 120/77 | HR 88 | Temp 97.8°F | Ht 64.0 in | Wt 232.0 lb

## 2020-08-22 DIAGNOSIS — E8881 Metabolic syndrome: Secondary | ICD-10-CM | POA: Diagnosis not present

## 2020-08-22 DIAGNOSIS — Z6839 Body mass index (BMI) 39.0-39.9, adult: Secondary | ICD-10-CM | POA: Diagnosis not present

## 2020-08-22 DIAGNOSIS — I10 Essential (primary) hypertension: Secondary | ICD-10-CM

## 2020-08-22 NOTE — Progress Notes (Signed)
Chief Complaint:   OBESITY [name] is here to discuss her progress with her obesity treatment plan along with follow-up of her obesity related diagnoses. [name] is on the Category 3 Plan and states she is following her eating plan approximately 83% of the time. [name] states she is exercising 0 minutes 0 times per week due to pulled ankle.  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  She has been treating pain with rest and heating pad.  She is on Saxenda 1.2 mg daily and denies mass in neck, dysphagia, dyspepsia, or persistent hoarseness.  Subjective:   Essential hypertension. Blood pressure and heart rate are excellent on today's office visit. [name] is on lisinopril/HCTZ 20/2.5 mg daily. CMP on 06/13/2020 was stable.  BP Readings from Last 3 Encounters:  08/22/20 120/77  08/01/20 133/77  07/11/20 119/77   Lab Results  Component Value Date   CREATININE 0.79 06/13/2020   CREATININE 0.79 12/21/2019   CREATININE 0.84 08/04/2019   Insulin resistance. [name] has a diagnosis of insulin resistance based on her elevated fasting insulin level >5. She continues to work on diet and exercise to decrease her risk of diabetes. Insulin level on 06/13/2020 was above goal at 18.2 with normal A1c and blood glucose. [name] is on injectable GLP-1 - Saxenda 1.2 mg daily - which she is tolerating well.  Lab Results  Component Value Date   INSULIN 18.2 06/13/2020   INSULIN 22.1 12/21/2019   INSULIN 13.2 05/04/2019   INSULIN 16.1 01/29/2019   INSULIN 16.9 09/08/2018   Lab Results  Component Value Date   HGBA1C 5.0 06/13/2020   Assessment/Plan:   Essential hypertension. [name] is  working on healthy weight loss and exercise to improve blood pressure control. We will watch for signs of hypotension as she continues her lifestyle modifications. She will continue lisinopril/HCTZ 20/12.5 mg daily as directed.   Insulin resistance. [name] will continue to work on weight loss, exercise, and decreasing simple carbohydrates to help decrease the risk of diabetes. [name] agreed to follow-up with [name] as directed to closely monitor her progress. She will continue Saxenda as directed.   Class 2 severe obesity with serious comorbidity and body mass index (BMI) of 39.0 to 39.9 in adult, unspecified obesity type ([name]). [name] will continue Saxenda 1.2 mg daily.   [name] is currently in the action stage of change. As such, her goal is to continue with weight loss efforts. She has agreed to the Category 3 Plan.   Handouts were provided on Holiday Strategies and Holiday Recipes.  Exercise goals: No exercise has been prescribed at this time due to acute injury.  Behavioral modification strategies: increasing lean protein intake, meal planning and cooking strategies, holiday eating strategies  and planning for success.  [name] has agreed to follow-up with our clinic in 3 weeks. She was informed of the importance of frequent follow-up visits to maximize her success with intensive lifestyle modifications for her multiple health conditions.   Objective:   Blood pressure 120/77, pulse 88, temperature 97.8 F (36.6 C), height 5\' 4"  (1.626 m), weight 232 lb (105.2 kg), SpO2 99 %. Body mass index is 39.82 kg/m.  General:  Cooperative, alert, well developed, in no acute distress. HEENT: Conjunctivae and lids unremarkable. Cardiovascular: Regular rhythm.  Lungs: Normal work of breathing. Neurologic: No focal deficits.   Lab Results  Component Value Date   CREATININE 0.79 06/13/2020   BUN 15 06/13/2020   NA 138 06/13/2020   K 4.0 06/13/2020   CL 106 06/13/2020   CO2 18 (L) 06/13/2020   Lab Results   Component Value Date   ALT 16 06/13/2020   AST 12 06/13/2020   ALKPHOS 48 06/13/2020   BILITOT 0.4 06/13/2020   Lab Results  Component Value Date   HGBA1C 5.0 06/13/2020   HGBA1C 5.1 12/21/2019   HGBA1C 5.1 05/04/2019   HGBA1C 5.1 01/29/2019   HGBA1C 5.2 09/08/2018   Lab Results  Component Value Date   INSULIN 18.2 06/13/2020   INSULIN 22.1 12/21/2019   INSULIN 13.2 05/04/2019   INSULIN 16.1 01/29/2019   INSULIN 16.9 09/08/2018   Lab Results  Component Value Date   TSH 2.760 06/13/2020   Lab Results  Component Value Date   CHOL 167 08/04/2019   HDL 56 08/04/2019   LDLCALC 93 08/04/2019   TRIG 98 08/04/2019   CHOLHDL 3.0 08/04/2019   Lab Results  Component Value Date   WBC 8.5 08/04/2019   HGB 14.5 08/04/2019   HCT 43.5 08/04/2019   MCV 90 08/04/2019   PLT 212 08/04/2019   No results found for: IRON, TIBC, FERRITIN  Attestation Statements:   Reviewed by clinician on day of visit: allergies, medications, problem list, medical history, surgical history, family history, social history, and previous encounter notes.  Time spent on visit including pre-visit chart review and post-visit charting and care was 27 minutes.   I, Michaelene Song, am acting as Location manager for [employer], NP-C   I have reviewed the above documentation for accuracy and completeness, and I agree with the above. -  Makaleigh Reinard d. Sabastien Tyler, NP-C

## 2020-08-23 ENCOUNTER — Other Ambulatory Visit: Payer: Self-pay | Admitting: Family Medicine

## 2020-08-23 ENCOUNTER — Telehealth: Payer: Self-pay | Admitting: Family Medicine

## 2020-08-23 MED ORDER — ACETAZOLAMIDE 250 MG PO TABS: 500.0000 mg | ORAL_TABLET | Freq: Two times a day (BID) | ORAL | 0 refills | Status: DC

## 2020-08-23 MED FILL — acetaZOLAMIDE 250 MG TABS: 250 | 90 days supply | Qty: 360 | Fill #0

## 2020-08-23 NOTE — Telephone Encounter (Signed)
Pt has scheduled her annual f/u and is on wait list,Pt is requesting a refill for acetaZOLAMIDE (DIAMOX) 250 MG tablet.  Pharmacy: [facility]

## 2020-08-23 NOTE — Addendum Note (Signed)
Addended by: Gildardo Griffes on: 08/23/2020 12:01 PM   Modules accepted: Orders

## 2020-08-23 NOTE — Telephone Encounter (Signed)
I see pt has an appt with Amy scheduled for 11/23/20. 3 month supply refill sent.

## 2020-08-31 IMAGING — MG DIGITAL SCREENING BILAT W/ TOMO W/ CAD
8 series · 8 of 24 positions shown · non-contrast
Comparison: Previous exam(s).

CLINICAL DATA: Screening.

EXAM:
DIGITAL SCREENING BILATERAL MAMMOGRAM WITH TOMO AND CAD

[L CC synth-2D]
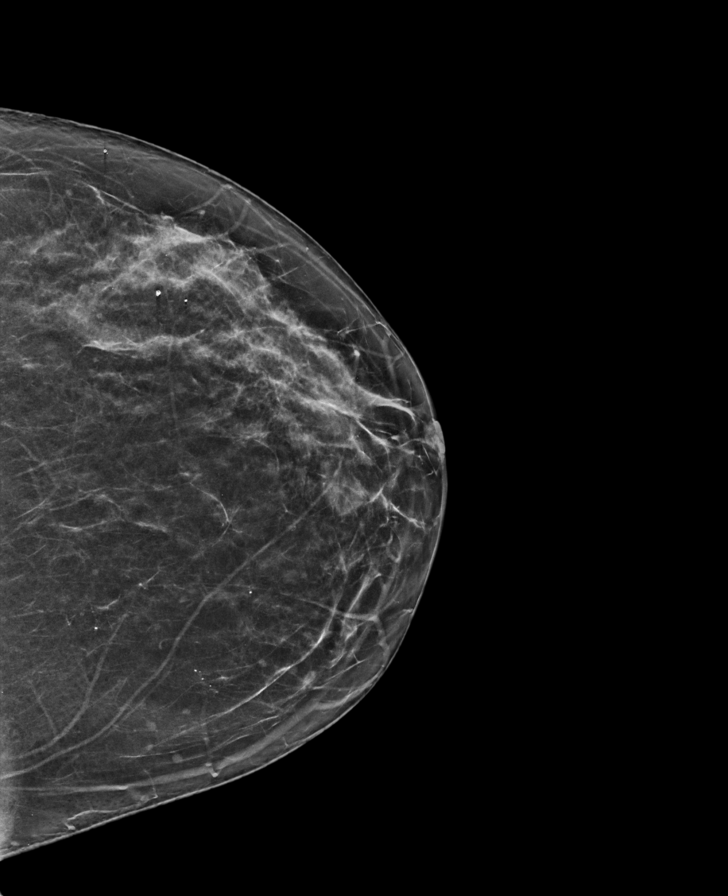

[L MLO synth-2D]
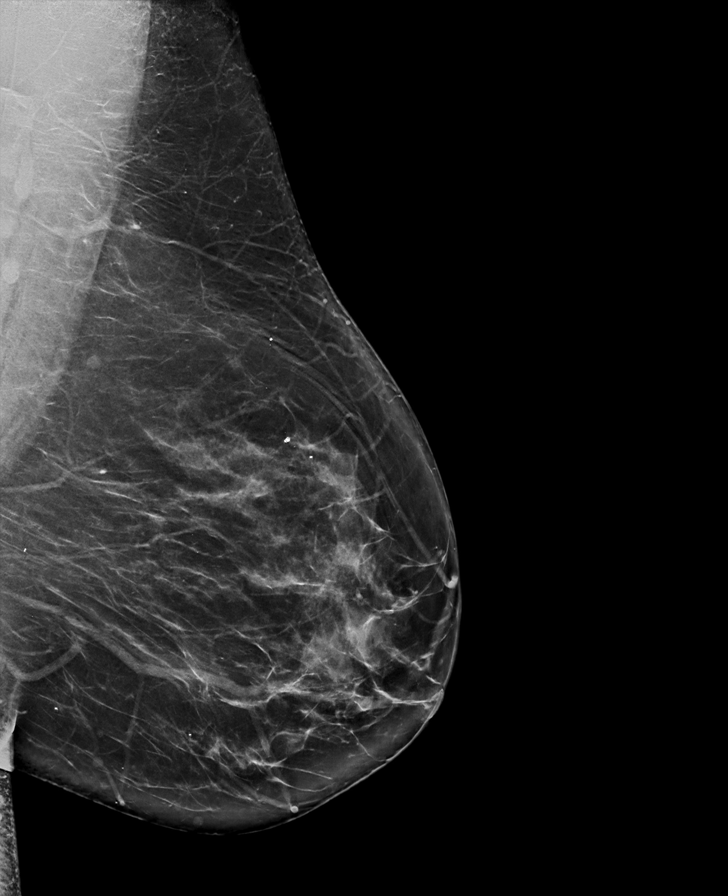

[R CC synth-2D]
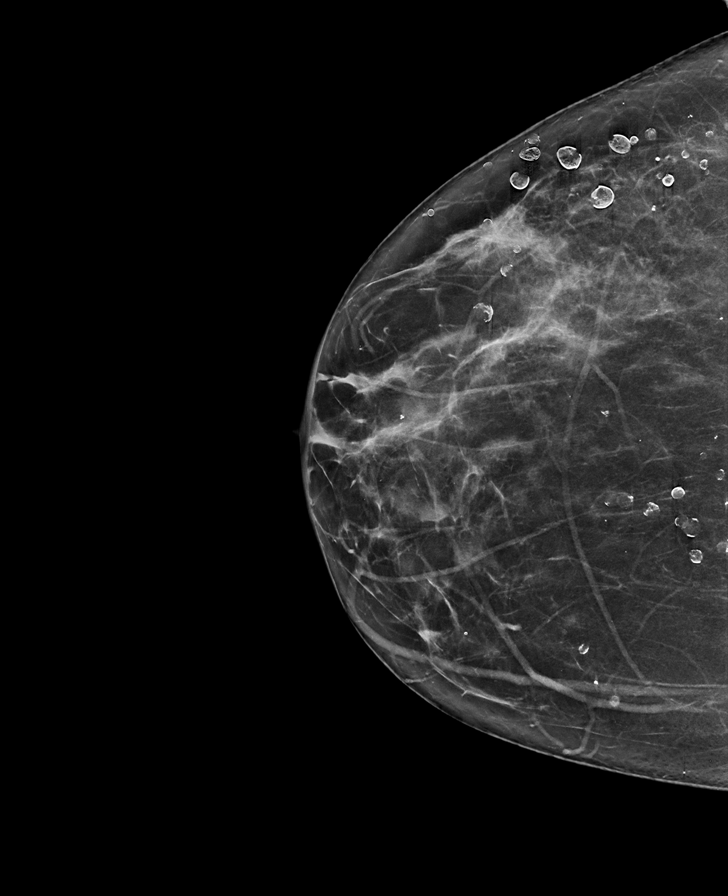

[R MLO synth-2D]
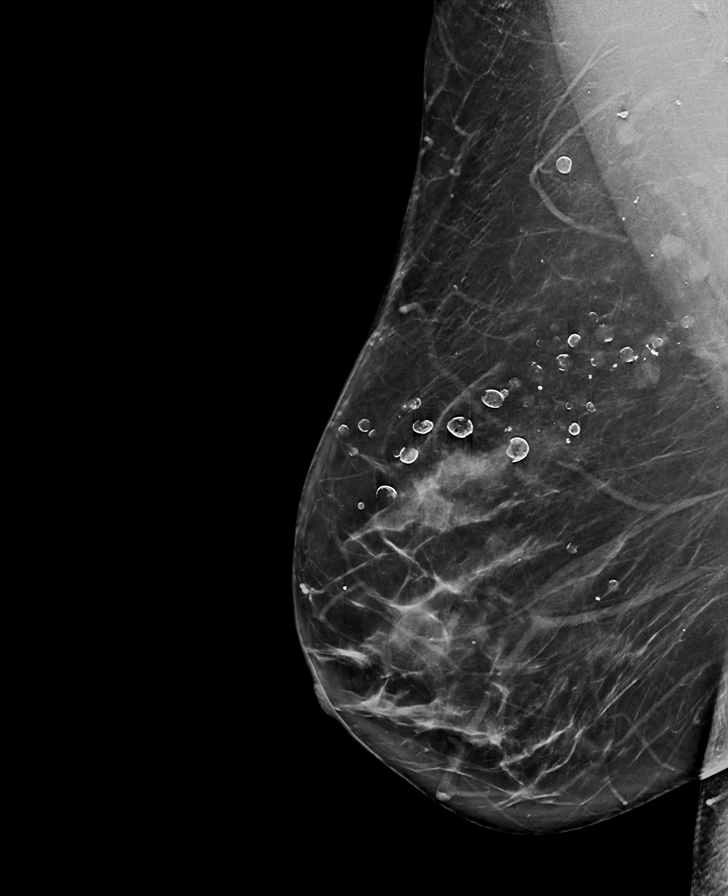

[R CC tomo · tomo slice 33/64.0]
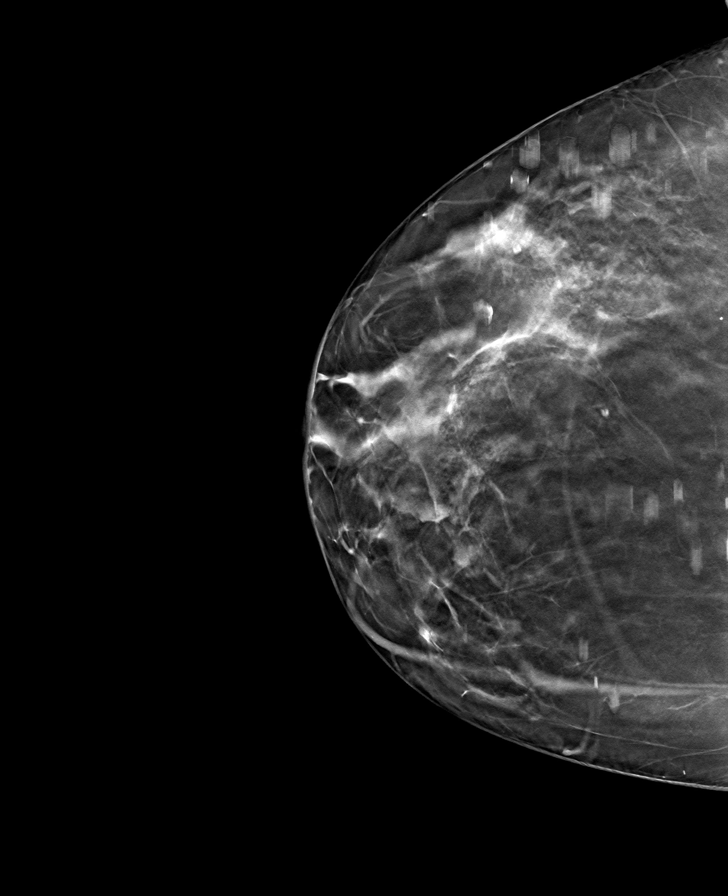

[L CC tomo · tomo slice 33/64.0]
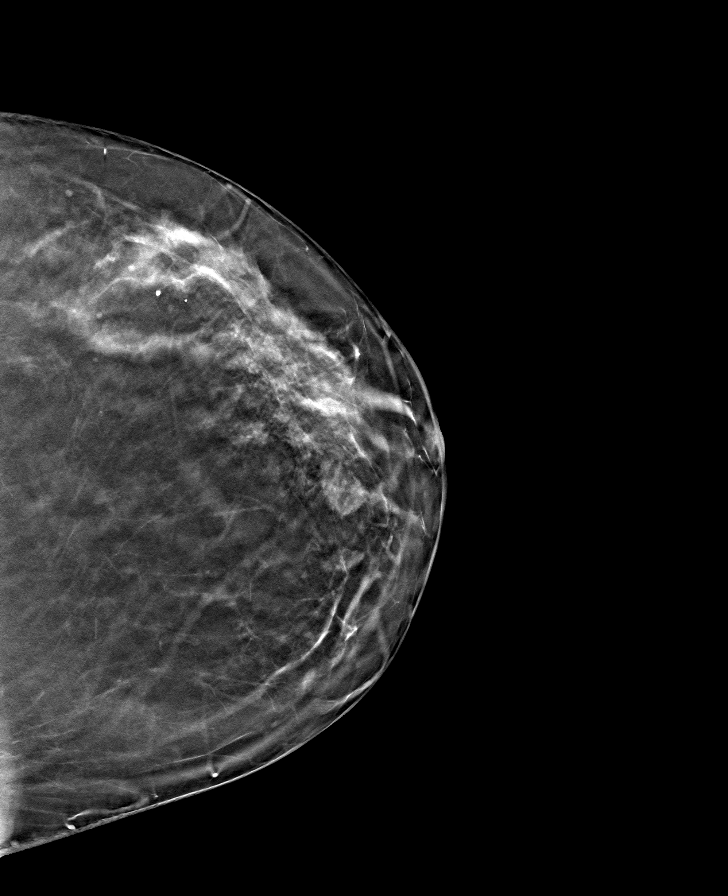

[L MLO tomo · tomo slice 42/83.0]
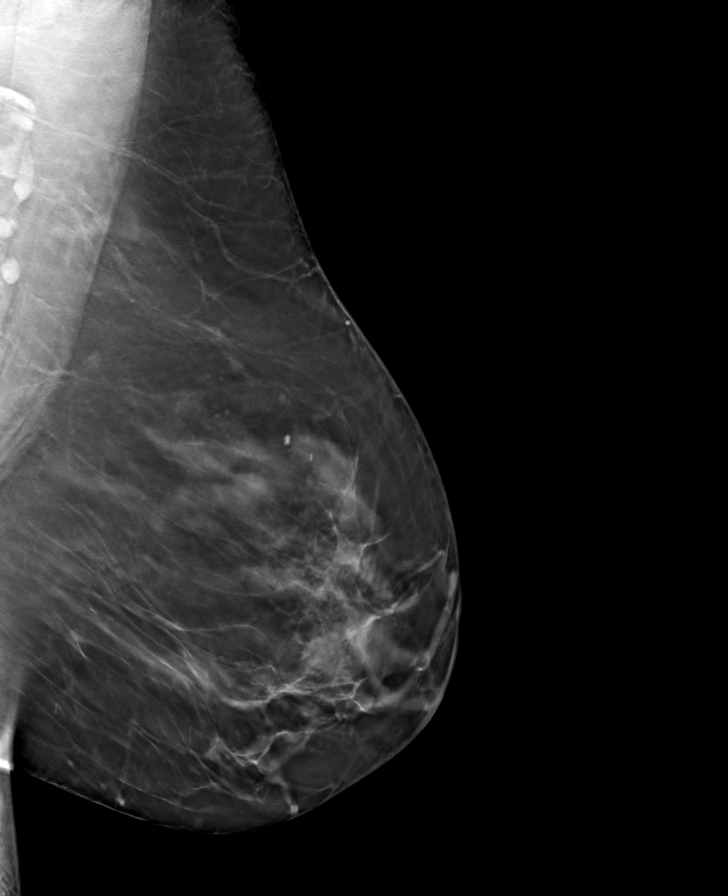

[R MLO tomo · tomo slice 43/84.0]
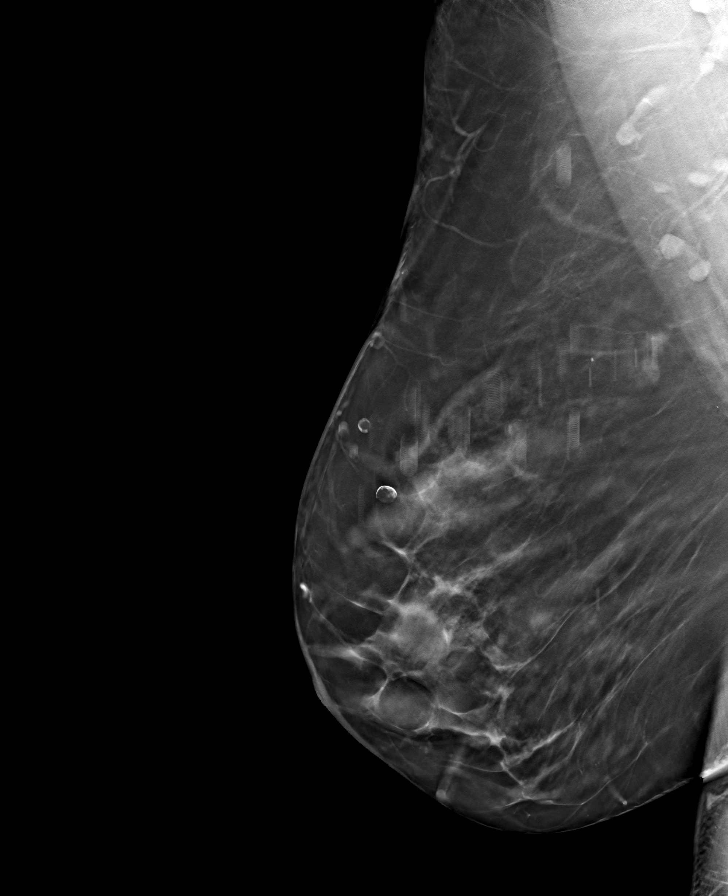

[8 of 24 positions shown; findings below may reference images not displayed]

ACR Breast Density Category b: There are scattered areas of
fibroglandular density.
FINDINGS: There are no findings suspicious for malignancy. Images were
processed with CAD.
IMPRESSION: No mammographic evidence of malignancy. A result letter of this
screening mammogram will be mailed directly to the patient.

RECOMMENDATION:
Screening mammogram in one year. (Code:[id number])

BI-RADS CATEGORY  1: Negative.

## 2020-09-12 ENCOUNTER — Other Ambulatory Visit: Payer: Self-pay | Admitting: Family

## 2020-09-12 ENCOUNTER — Other Ambulatory Visit: Payer: Self-pay

## 2020-09-12 ENCOUNTER — Encounter (INDEPENDENT_AMBULATORY_CARE_PROVIDER_SITE_OTHER): Payer: Self-pay | Admitting: Adult Health

## 2020-09-12 ENCOUNTER — Ambulatory Visit (INDEPENDENT_AMBULATORY_CARE_PROVIDER_SITE_OTHER): Payer: 59 | Admitting: Adult Health

## 2020-09-12 VITALS — BP 119/79 | HR 90 | Temp 97.7°F | Ht 64.0 in | Wt 233.0 lb

## 2020-09-12 DIAGNOSIS — Z6841 Body Mass Index (BMI) 40.0 and over, adult: Secondary | ICD-10-CM | POA: Diagnosis not present

## 2020-09-12 DIAGNOSIS — J4520 Mild intermittent asthma, uncomplicated: Secondary | ICD-10-CM

## 2020-09-12 DIAGNOSIS — I10 Essential (primary) hypertension: Secondary | ICD-10-CM

## 2020-09-12 DIAGNOSIS — J301 Allergic rhinitis due to pollen: Secondary | ICD-10-CM

## 2020-09-12 DIAGNOSIS — E8881 Metabolic syndrome: Secondary | ICD-10-CM | POA: Diagnosis not present

## 2020-09-12 MED FILL — MONTELUKAST SOD 10 MG TAB: 10 | 90 days supply | Qty: 90 | Fill #0

## 2020-09-13 NOTE — Progress Notes (Signed)
Chief Complaint:   OBESITY Aretta is here to discuss her progress with her obesity treatment plan along with follow-up of her obesity related diagnoses. Amandalynn is on the Category 3 Plan and states she is following her eating plan approximately 75% of the time. Bessie states she is cardio and lifting weights 45 minutes 1 times per week.  Today's visit was #: 39 Starting weight: 250 lbs Starting date: 09/08/2018 Today's weight: 233 lbs Today's date: 09/12/2020 Total lbs lost to date: 17 lbs Total lbs lost since last in-office visit: 0  Interim History: Tawnia's goal for 2022 is to increase her time at the gym and to continue to eat healthier. She has been on Saxenda 1.2 mg daily since Oct 2021 and is tolerating it well. She reports having an excellent support system. Her husband is a Tax adviser.  Subjective:   1. Insulin resistance Linday has a diagnosis of insulin resistance based on her elevated fasting insulin level >5. She continues to work on diet and exercise to decrease her risk of diabetes. Insulin level above goal at last two checks. Her blood glucose and A1c were normal.  Lab Results  Component Value Date   INSULIN 18.2 06/13/2020   INSULIN 22.1 12/21/2019   INSULIN 13.2 05/04/2019   INSULIN 16.1 01/29/2019   INSULIN 16.9 09/08/2018   Lab Results  Component Value Date   HGBA1C 5.0 06/13/2020    2. Essential hypertension Dacota's blood pressure and heart rate are excellent at OV today. She is on lisinopril/HCTZ 20/12.5 mg daily.  BP Readings from Last 3 Encounters:  09/12/20 119/79  08/22/20 120/77  08/01/20 133/77    Assessment/Plan:   1. Insulin resistance Katilin will continue to work on weight loss, exercise, and decreasing simple carbohydrates to help decrease the risk of diabetes. Ottis agreed to follow-up with [name] as directed to closely monitor her progress. Continue category 3 meal plan.  2. Essential hypertension Araceli is working on healthy weight loss and  exercise to improve blood pressure control. We will watch for signs of hypotension as she continues her lifestyle modifications. Continue lisinopril/HCTZ 20/12.5 mg daily.  3. Class 3 severe obesity with serious comorbidity and body mass index (BMI) of 40.0 to 44.9 in adult, unspecified obesity type ([facility]) Kehinde is currently in the action stage of change. As such, her goal is to continue with weight loss efforts. She has agreed to the Category 3 Plan.   Exercise goals: As is  Behavioral modification strategies: increasing lean protein intake, meal planning and cooking strategies and planning for success.  Lowella has agreed to follow-up with our clinic in 3 weeks. She was informed of the importance of frequent follow-up visits to maximize her success with intensive lifestyle modifications for her multiple health conditions.   Objective:   Blood pressure 119/79, pulse 90, temperature 97.7 F (36.5 C), temperature source Oral, height 5\' 4"  (1.626 m), weight 233 lb (105.7 kg), last menstrual period 09/05/2020, SpO2 100 %. Body mass index is 39.99 kg/m.  General: Cooperative, alert, well developed, in no acute distress. HEENT: Conjunctivae and lids unremarkable. Cardiovascular: Regular rhythm.  Lungs: Normal work of breathing. Neurologic: No focal deficits.   Lab Results  Component Value Date   CREATININE 0.79 06/13/2020   BUN 15 06/13/2020   NA 138 06/13/2020   K 4.0 06/13/2020   CL 106 06/13/2020   CO2 18 (L) 06/13/2020   Lab Results  Component Value Date   ALT 16 06/13/2020  AST 12 06/13/2020   ALKPHOS 48 06/13/2020   BILITOT 0.4 06/13/2020   Lab Results  Component Value Date   HGBA1C 5.0 06/13/2020   HGBA1C 5.1 12/21/2019   HGBA1C 5.1 05/04/2019   HGBA1C 5.1 01/29/2019   HGBA1C 5.2 09/08/2018   Lab Results  Component Value Date   INSULIN 18.2 06/13/2020   INSULIN 22.1 12/21/2019   INSULIN 13.2 05/04/2019   INSULIN 16.1 01/29/2019   INSULIN 16.9 09/08/2018   Lab  Results  Component Value Date   TSH 2.760 06/13/2020   Lab Results  Component Value Date   CHOL 167 08/04/2019   HDL 56 08/04/2019   LDLCALC 93 08/04/2019   TRIG 98 08/04/2019   CHOLHDL 3.0 08/04/2019   Lab Results  Component Value Date   WBC 8.5 08/04/2019   HGB 14.5 08/04/2019   HCT 43.5 08/04/2019   MCV 90 08/04/2019   PLT 212 08/04/2019   Attestation Statements:   Reviewed by clinician on day of visit: allergies, medications, problem list, medical history, surgical history, family history, social history, and previous encounter notes.  Time spent on visit including pre-visit chart review and post-visit care and charting was 35 minutes.   Coral Ceo, am acting as Location manager for Mina Marble, NP.  I have reviewed the above documentation for accuracy and completeness, and I agree with the above. -  Lasheika Ortloff d. Vee Bahe, NP-C

## 2020-09-15 ENCOUNTER — Encounter: Payer: Self-pay | Admitting: Family

## 2020-09-15 ENCOUNTER — Other Ambulatory Visit: Payer: Self-pay

## 2020-09-15 ENCOUNTER — Other Ambulatory Visit (HOSPITAL_COMMUNITY)
Admission: RE | Admit: 2020-09-15 | Discharge: 2020-09-15 | Disposition: A | Discharge status: Home / Self Care | Payer: 59 | Source: Ambulatory Visit | Attending: Family | Admitting: Family

## 2020-09-15 ENCOUNTER — Ambulatory Visit (INDEPENDENT_AMBULATORY_CARE_PROVIDER_SITE_OTHER): Payer: 59 | Admitting: Family

## 2020-09-15 ENCOUNTER — Other Ambulatory Visit: Payer: Self-pay | Admitting: Family

## 2020-09-15 VITALS — BP 111/70 | HR 88 | Temp 98.1°F | Ht 64.0 in | Wt 240.0 lb

## 2020-09-15 DIAGNOSIS — I10 Essential (primary) hypertension: Secondary | ICD-10-CM

## 2020-09-15 DIAGNOSIS — J4520 Mild intermittent asthma, uncomplicated: Secondary | ICD-10-CM

## 2020-09-15 DIAGNOSIS — Z1211 Encounter for screening for malignant neoplasm of colon: Secondary | ICD-10-CM | POA: Diagnosis not present

## 2020-09-15 DIAGNOSIS — J301 Allergic rhinitis due to pollen: Secondary | ICD-10-CM

## 2020-09-15 DIAGNOSIS — R002 Palpitations: Secondary | ICD-10-CM

## 2020-09-15 DIAGNOSIS — Z01411 Encounter for gynecological examination (general) (routine) with abnormal findings: Secondary | ICD-10-CM

## 2020-09-15 DIAGNOSIS — Z01419 Encounter for gynecological examination (general) (routine) without abnormal findings: Secondary | ICD-10-CM

## 2020-09-15 DIAGNOSIS — Z0000 Encounter for general adult medical examination without abnormal findings: Secondary | ICD-10-CM

## 2020-09-15 DIAGNOSIS — Z0001 Encounter for general adult medical examination with abnormal findings: Secondary | ICD-10-CM

## 2020-09-15 DIAGNOSIS — Z1159 Encounter for screening for other viral diseases: Secondary | ICD-10-CM | POA: Diagnosis not present

## 2020-09-15 MED ORDER — MONTELUKAST SODIUM 10 MG PO TABS: 10.0000 mg | ORAL_TABLET | Freq: Every day | ORAL | 1 refills | Status: DC

## 2020-09-15 MED ORDER — POTASSIUM CHLORIDE CRYS ER 10 MEQ PO TBCR: 10.0000 meq | EXTENDED_RELEASE_TABLET | Freq: Every day | ORAL | 1 refills | Status: DC

## 2020-09-15 MED FILL — POTASSIUM CHLORIDE CRYS ER: 10 | 90 days supply | Qty: 90 | Fill #0

## 2020-09-15 NOTE — Patient Instructions (Addendum)
Health Maintenance, Female Adopting a healthy lifestyle and getting preventive care are important in promoting health and wellness. Ask your health care provider about:  The right schedule for you to have regular tests and exams.  Things you can do on your own to prevent diseases and keep yourself healthy. What should I know about diet, weight, and exercise? Eat a healthy diet  Eat a diet that includes plenty of vegetables, fruits, low-fat dairy products, and lean protein.  Do not eat a lot of foods that are high in solid fats, added sugars, or sodium.   Maintain a healthy weight Body mass index (BMI) is used to identify weight problems. It estimates body fat based on height and weight. Your health care provider can help determine your BMI and help you achieve or maintain a healthy weight. Get regular exercise Get regular exercise. This is one of the most important things you can do for your health. Most adults should:  Exercise for at least 150 minutes each week. The exercise should increase your heart rate and make you sweat (moderate-intensity exercise).  Do strengthening exercises at least twice a week. This is in addition to the moderate-intensity exercise.  Spend less time sitting. Even light physical activity can be beneficial. Watch cholesterol and blood lipids Have your blood tested for lipids and cholesterol at 48 years of age, then have this test every 5 years. Have your cholesterol levels checked more often if:  Your lipid or cholesterol levels are high.  You are older than 48 years of age.  You are at high risk for heart disease. What should I know about cancer screening? Depending on your health history and family history, you may need to have cancer screening at various ages. This may include screening for:  Breast cancer.  Cervical cancer.  Colorectal cancer.  Skin cancer.  Lung cancer. What should I know about heart disease, diabetes, and high blood  pressure? Blood pressure and heart disease  High blood pressure causes heart disease and increases the risk of stroke. This is more likely to develop in people who have high blood pressure readings, are of African descent, or are overweight.  Have your blood pressure checked: ? Every 3-5 years if you are 18-39 years of age. ? Every year if you are 40 years old or older. Diabetes Have regular diabetes screenings. This checks your fasting blood sugar level. Have the screening done:  Once every three years after age 40 if you are at a normal weight and have a low risk for diabetes.  More often and at a younger age if you are overweight or have a high risk for diabetes. What should I know about preventing infection? Hepatitis B If you have a higher risk for hepatitis B, you should be screened for this virus. Talk with your health care provider to find out if you are at risk for hepatitis B infection. Hepatitis C Testing is recommended for:  Everyone born from 1945 through 1965.  Anyone with known risk factors for hepatitis C. Sexually transmitted infections (STIs)  Get screened for STIs, including gonorrhea and chlamydia, if: ? You are sexually active and are younger than 48 years of age. ? You are older than 48 years of age and your health care provider tells you that you are at risk for this type of infection. ? Your sexual activity has changed since you were last screened, and you are at increased risk for chlamydia or gonorrhea. Ask your health care provider   if you are at risk.  Ask your health care provider about whether you are at high risk for HIV. Your health care provider may recommend a prescription medicine to help prevent HIV infection. If you choose to take medicine to prevent HIV, you should first get tested for HIV. You should then be tested every 3 months for as long as you are taking the medicine. Pregnancy  If you are about to stop having your period (premenopausal) and  you may become pregnant, seek counseling before you get pregnant.  Take 400 to 800 micrograms (mcg) of folic acid every day if you become pregnant.  Ask for birth control (contraception) if you want to prevent pregnancy. Osteoporosis and menopause Osteoporosis is a disease in which the bones lose minerals and strength with aging. This can result in bone fractures. If you are 93 years old or older, or if you are at risk for osteoporosis and fractures, ask your health care provider if you should:  Be screened for bone loss.  Take a calcium or vitamin D supplement to lower your risk of fractures.  Be given hormone replacement therapy (HRT) to treat symptoms of menopause. Follow these instructions at home: Lifestyle  Do not use any products that contain nicotine or tobacco, such as cigarettes, e-cigarettes, and chewing tobacco. If you need help quitting, ask your health care provider.  Do not use street drugs.  Do not share needles.  Ask your health care provider for help if you need support or information about quitting drugs. Alcohol use  Do not drink alcohol if: ? Your health care provider tells you not to drink. ? You are pregnant, may be pregnant, or are planning to become pregnant.  If you drink alcohol: ? Limit how much you use to 0-1 drink a day. ? Limit intake if you are breastfeeding.  Be aware of how much alcohol is in your drink. In the [place] one drink equals one 12 oz bottle of beer (355 mL), one 5 oz glass of wine (148 mL), or one 1 oz glass of hard liquor (44 mL). General instructions  Schedule regular health, dental, and eye exams.  Stay current with your vaccines.  Tell your health care provider if: ? You often feel depressed. ? You have ever been abused or do not feel safe at home. Summary  Adopting a healthy lifestyle and getting preventive care are important in promoting health and wellness.  Follow your health care provider's instructions about healthy  diet, exercising, and getting tested or screened for diseases.  Follow your health care provider's instructions on monitoring your cholesterol and blood pressure. This information is not intended to replace advice given to you by your health care provider. Make sure you discuss any questions you have with your health care provider. Document Revised: 08/13/2018 Document Reviewed: 08/13/2018 Elsevier Patient Education  2021 [place].  Palpitations Palpitations are feelings that your heartbeat is irregular or is faster than normal. It may feel like your heart is fluttering or skipping a beat. Palpitations are usually not a serious problem. They may be caused by many things, including smoking, caffeine, alcohol, stress, and certain medicines or drugs. Most causes of palpitations are not serious. However, some palpitations can be a sign of a serious problem. You may need further tests to rule out serious medical problems. Follow these instructions at home: Pay attention to any changes in your condition. Take these actions to help manage your symptoms: Eating and drinking  Avoid foods and  drinks that may cause palpitations. These may include: ? Caffeinated coffee, tea, soft drinks, diet pills, and energy drinks. ? Chocolate. ? Alcohol. Lifestyle  Take steps to reduce your stress and anxiety. Things that can help you relax include: ? Yoga. ? Mind-body activities, such as deep breathing, meditation, or using words and images to create positive thoughts (guided imagery). ? Physical activity, such as swimming, jogging, or walking. Tell your health care provider if your palpitations increase with activity. If you have chest pain or shortness of breath with activity, do not continue the activity until you are seen by your health care provider. ? Biofeedback. This is a method that helps you learn to use your mind to control things in your body, such as your heartbeat.  Do not use drugs, including  cocaine or ecstasy. Do not use marijuana.  Get plenty of rest and sleep. Keep a regular bed time. General instructions  Take over-the-counter and prescription medicines only as told by your health care provider.  Do not use any products that contain nicotine or tobacco, such as cigarettes and e-cigarettes. If you need help quitting, ask your health care provider.  Keep all follow-up visits as told by your health care provider. This is important. These may include visits for further testing if palpitations do not go away or get worse.      Contact a health care provider if you:  Continue to have a fast or irregular heartbeat after 24 hours.  Notice that your palpitations occur more often. Get help right away if you:  Have chest pain or shortness of breath.  Have a severe headache.  Feel dizzy or you faint. Summary  Palpitations are feelings that your heartbeat is irregular or is faster than normal. It may feel like your heart is fluttering or skipping a beat.  Palpitations may be caused by many things, including smoking, caffeine, alcohol, stress, certain medicines, and drugs.  Although most causes of palpitations are not serious, some causes can be a sign of a serious medical problem.  Get help right away if you faint or have chest pain, shortness of breath, a severe headache, or dizziness. This information is not intended to replace advice given to you by your health care provider. Make sure you discuss any questions you have with your health care provider. Document Revised: 10/02/2017 Document Reviewed: 10/02/2017 Elsevier Patient Education  2021 [employer].

## 2020-09-15 NOTE — Progress Notes (Signed)
Subjective:    Patient ID: [name], female    DOB: [birth year], 50 y.o.   MRN: [id number]  Chief Complaint  Patient presents with  . Annual Exam    With pap    Pt presents to the office today for CPE with pap.Pt has idiopathic intracranial hypertension and is followed by Dr. Leta Baptist, neurologists annually. She is followed by Weight management ever 3 weeks. She states she has lost 17 lbs since starting there.  Hypertension This is a chronic problem. The current episode started more than 1 year ago. The problem has been resolved since onset. Associated symptoms include malaise/fatigue and palpitations. Pertinent negatives include no peripheral edema or shortness of breath. Risk factors for coronary artery disease include dyslipidemia, obesity and sedentary lifestyle. The current treatment provides moderate improvement. There is no history of CVA or heart failure.  Asthma There is no shortness of breath. Associated symptoms include malaise/fatigue. Her past medical history is significant for asthma.  Gynecologic Exam The patient's pertinent negatives include no genital odor or vaginal bleeding. This is a chronic problem. The current episode started more than 1 year ago. The problem occurs intermittently. The problem has been waxing and waning.  Palpitations  This is a new problem. The current episode started more than 1 month ago. The problem occurs intermittently. Associated symptoms include malaise/fatigue. Pertinent negatives include no shortness of breath.      Review of Systems  Constitutional: Positive for malaise/fatigue.  Respiratory: Negative for shortness of breath.   Cardiovascular: Positive for palpitations.  All other systems reviewed and are negative.  Family History  Problem Relation Age of Onset  . Cataracts Mother   . Diabetes Mother   . Hypertension Mother   . High Cholesterol Mother   . Obesity Mother   . Diabetes Father   . Hypertension Father   .  Obesity Father    Social History   Socioeconomic History  . Marital status: Married    Spouse name: Charlotte Crumb  . Number of children: 0  . Years of education: 89  . Highest education level: Not on file  Occupational History  . Occupation: CT Engineer, production: [employer]  Tobacco Use  . Smoking status: Never Smoker  . Smokeless tobacco: Never Used  Vaping Use  . Vaping Use: Never used  Substance and Sexual Activity  . Alcohol use: No    Alcohol/week: 0.0 standard drinks  . Drug use: No  . Sexual activity: Not on file  Other Topics Concern  . Not on file  Social History Narrative   Married, no children, lives at home   Caffeine use- 2-3 cups coffee 5 days a week   Social Determinants of Health   Financial Resource Strain: Not on file  Food Insecurity: Not on file  Transportation Needs: Not on file  Physical Activity: Not on file  Stress: Not on file  Social Connections: Not on file       Objective:   Physical Exam Vitals reviewed.  Constitutional:      General: She is not in acute distress.    Appearance: She is well-developed and well-nourished.  HENT:     Head: Normocephalic and atraumatic.     Right Ear: Tympanic membrane normal.     Left Ear: Tympanic membrane normal.     Mouth/Throat:     Mouth: Oropharynx is clear and moist.  Eyes:     Pupils: Pupils are equal, round, and reactive to  light.  Neck:     Thyroid: No thyromegaly.  Cardiovascular:     Rate and Rhythm: Normal rate and regular rhythm.     Pulses: Intact distal pulses.     Heart sounds: Normal heart sounds. No murmur heard.   Pulmonary:     Effort: Pulmonary effort is normal. No respiratory distress.     Breath sounds: Normal breath sounds. No wheezing.  Chest:  Breasts:     Right: No swelling, bleeding, inverted nipple, mass, nipple discharge, skin change or tenderness.     Left: No swelling, bleeding, inverted nipple, mass, nipple discharge, skin change or tenderness.     Abdominal:     General: Bowel sounds are normal. There is no distension.     Palpations: Abdomen is soft.     Tenderness: There is no abdominal tenderness.  Genitourinary:    Comments: Bimanual exam- no adnexal masses or tenderness, ovaries nonpalpable   Cervix parous and pink- No discharge  Musculoskeletal:        General: No tenderness or edema. Normal range of motion.     Cervical back: Normal range of motion and neck supple.  Skin:    General: Skin is warm and dry.  Neurological:     Mental Status: She is alert and oriented to person, place, and time.     Cranial Nerves: No cranial nerve deficit.     Deep Tendon Reflexes: Reflexes are normal and symmetric.  Psychiatric:        Mood and Affect: Mood and affect normal.        Behavior: Behavior normal.        Thought Content: Thought content normal.        Judgment: Judgment normal.       BP 111/70   Pulse 88   Temp 98.1 F (36.7 C) (Temporal)   Ht $R'5\' 4"'IA$  (1.626 m)   Wt 240 lb (108.9 kg)   LMP 09/05/2020   BMI 41.20 kg/m      Assessment & Plan:  [name] comes in today with chief complaint of Annual Exam (With pap)   Diagnosis and orders addressed:  1. Allergic rhinitis due to pollen, unspecified seasonality - montelukast (SINGULAIR) 10 MG tablet; Take 1 tablet (10 mg total) by mouth at bedtime.  Dispense: 90 tablet; Refill: 1 - CMP14+EGFR - CBC with Differential/Platelet  2. Mild intermittent asthma without complication - montelukast (SINGULAIR) 10 MG tablet; Take 1 tablet (10 mg total) by mouth at bedtime.  Dispense: 90 tablet; Refill: 1 - CMP14+EGFR - CBC with Differential/Platelet  3. Essential hypertension - CMP14+EGFR - CBC with Differential/Platelet  4. Annual physical exam - CMP14+EGFR - CBC with Differential/Platelet - Lipid panel - TSH  5. Gynecologic exam normal - CMP14+EGFR - CBC with Differential/Platelet - Cytology - PAP([place])  6. Colon cancer screening -  Ambulatory referral to Gastroenterology - CMP14+EGFR - CBC with Differential/Platelet  7. Need for hepatitis C screening test - Hepatitis C antibody  8. Palpitations - EKG 12-Lead - Ambulatory referral to Cardiology   Labs pending Health Maintenance reviewed Diet and exercise encouraged  Follow up plan: 6 months    [name], FNP

## 2020-09-16 LAB — CBC WITH DIFFERENTIAL/PLATELET
Basophils Absolute: 0 10*3/uL (ref 0.0–0.2)
Basos: 1 %
EOS (ABSOLUTE): 0.3 10*3/uL (ref 0.0–0.4)
Eos: 4 %
Hematocrit: 40.3 % (ref 34.0–46.6)
Hemoglobin: 13.5 g/dL (ref 11.1–15.9)
Immature Grans (Abs): 0 10*3/uL (ref 0.0–0.1)
Immature Granulocytes: 0 %
Lymphocytes Absolute: 2.1 10*3/uL (ref 0.7–3.1)
Lymphs: 25 %
MCH: 30 pg (ref 26.6–33.0)
MCHC: 33.5 g/dL (ref 31.5–35.7)
MCV: 90 fL (ref 79–97)
Monocytes Absolute: 0.7 10*3/uL (ref 0.1–0.9)
Monocytes: 8 %
Neutrophils Absolute: 5.1 10*3/uL (ref 1.4–7.0)
Neutrophils: 62 %
Platelets: 237 10*3/uL (ref 150–450)
RBC: 4.5 x10E6/uL (ref 3.77–5.28)
RDW: 12.3 % (ref 11.7–15.4)
WBC: 8.2 10*3/uL (ref 3.4–10.8)

## 2020-09-16 LAB — CMP14+EGFR
ALT: 17 IU/L (ref 0–32)
AST: 11 IU/L (ref 0–40)
Albumin/Globulin Ratio: 2 (ref 1.2–2.2)
Albumin: 4.4 g/dL (ref 3.8–4.8)
Alkaline Phosphatase: 43 IU/L — ABNORMAL LOW (ref 44–121)
BUN/Creatinine Ratio: 22 (ref 9–23)
BUN: 20 mg/dL (ref 6–24)
Bilirubin Total: <0.2 mg/dL (ref 0.0–1.2)
CO2: 20 mmol/L (ref 20–29)
Calcium: 9.7 mg/dL (ref 8.7–10.2)
Chloride: 107 mmol/L — ABNORMAL HIGH (ref 96–106)
Creatinine, Ser: 0.92 mg/dL (ref 0.57–1.00)
GFR calc Af Amer: 86 mL/min/{1.73_m2} (ref >59)
GFR calc non Af Amer: 74 mL/min/{1.73_m2} (ref >59)
Globulin, Total: 2.2 g/dL (ref 1.5–4.5)
Glucose: 88 mg/dL (ref 65–99)
Potassium: 3.5 mmol/L (ref 3.5–5.2)
Sodium: 140 mmol/L (ref 134–144)
Total Protein: 6.6 g/dL (ref 6.0–8.5)

## 2020-09-16 LAB — TSH: TSH: 2.81 u[IU]/mL (ref 0.450–4.500)

## 2020-09-16 LAB — LIPID PANEL
Chol/HDL Ratio: 3.1 ratio (ref 0.0–4.4)
Cholesterol, Total: 177 mg/dL (ref 100–199)
HDL: 58 mg/dL (ref >39)
LDL Chol Calc (NIH): 105 mg/dL — ABNORMAL HIGH (ref 0–99)
Triglycerides: 72 mg/dL (ref 0–149)
VLDL Cholesterol Cal: 14 mg/dL (ref 5–40)

## 2020-09-16 LAB — HEPATITIS C ANTIBODY: Hep C Virus Ab: <0.1 s/co ratio (ref 0.0–0.9)

## 2020-09-20 LAB — CYTOLOGY - PAP: Diagnosis: NEGATIVE

## 2020-10-03 ENCOUNTER — Ambulatory Visit (INDEPENDENT_AMBULATORY_CARE_PROVIDER_SITE_OTHER): Payer: 59 | Admitting: Adult Health

## 2020-10-03 ENCOUNTER — Other Ambulatory Visit: Payer: Self-pay

## 2020-10-03 ENCOUNTER — Encounter (INDEPENDENT_AMBULATORY_CARE_PROVIDER_SITE_OTHER): Payer: Self-pay | Admitting: Adult Health

## 2020-10-03 VITALS — BP 130/78 | HR 78 | Temp 98.5°F | Ht 64.0 in | Wt 231.0 lb

## 2020-10-03 DIAGNOSIS — R002 Palpitations: Secondary | ICD-10-CM

## 2020-10-03 DIAGNOSIS — E7849 Other hyperlipidemia: Secondary | ICD-10-CM

## 2020-10-03 DIAGNOSIS — I10 Essential (primary) hypertension: Secondary | ICD-10-CM

## 2020-10-03 DIAGNOSIS — Z6839 Body mass index (BMI) 39.0-39.9, adult: Secondary | ICD-10-CM

## 2020-10-03 NOTE — Progress Notes (Signed)
Chief Complaint:   OBESITY [name] is here to discuss her progress with her obesity treatment plan along with follow-up of her obesity related diagnoses. [name] is on the Category 3 Plan and states she is following her eating plan approximately 70% of the time. [name] states she is elliptical and resistance machine 40 minutes 3 times per week.  Today's visit was #: 70 Starting weight: 250 lbs Starting date: 09/08/2018 Today's weight: 231 lbs Today's date: 10/03/2020 Total lbs lost to date: 19 lbs Total lbs lost since last in-office visit: 2 lbs  Interim History: [name] was seen by her PCP on 09/14/2020. She has annual physical with labs- results reviewed with pt today. Her brother is hospitalized with complications of [id number]. She reports poor sleep due to family stress and demanding work schedule. She is on Saxenda 1.2 mg daily for obesity management. She is tolerating GLP-1 therapy well.   Subjective:   1. Other hyperlipidemia Discussed labs with patient today. 09/15/2020 Lipid panel- LDL resulted slightly above goal at 105. She not on statin therapy.   She was not fasting when lab was drawn, however BG was normal in CMP.  Lab Results  Component Value Date   ALT 17 09/15/2020   AST 11 09/15/2020   ALKPHOS 43 (L) 09/15/2020   BILITOT <0.2 09/15/2020   Lab Results  Component Value Date   CHOL 177 09/15/2020   HDL 58 09/15/2020   LDLCALC 105 (H) 09/15/2020   TRIG 72 09/15/2020   CHOLHDL 3.1 09/15/2020    2. Essential hypertension BP and heart rate are at goal at OV today. 09/15/2020 CMP is stable. [name] is on lisinopril/HCTZ 20-12.5 mg daily. She has been experiencing intermittent palpitations for over 2 years. PCP referred her to cardiology.   BP Readings from Last 3 Encounters:  10/03/20 130/78  09/15/20 111/70  09/12/20 119/79    3. Palpitations [name] is experiencing brief palpitations 10-15 seconds in duration. She denies chest pains, dyspnea with exertion. She is on  Klor-Con 10 meq. CMP 09/15/2020 potassium 3.5.   Assessment/Plan:   1. Other hyperlipidemia Cardiovascular risk and specific lipid/LDL goals reviewed.  We discussed several lifestyle modifications today and [name] will continue to work on diet, exercise and weight loss efforts. Orders and follow up as documented in patient record. Check labs in 3 months, fasting.  Counseling Intensive lifestyle modifications are the first line treatment for this issue. . Dietary changes: Increase soluble fiber. Decrease simple carbohydrates. . Exercise changes: Moderate to vigorous-intensity aerobic activity 150 minutes per week if tolerated. . Lipid-lowering medications: see documented in medical record.  2. Essential hypertension [name] is working on healthy weight loss and exercise to improve blood pressure control. We will watch for signs of hypotension as she continues her lifestyle modifications. Continue lisinopril/HCTZ 20-12.5 mg daily.  3. Palpitations PCP referred to cardiology.  4. Class 2 severe obesity with serious comorbidity and body mass index (BMI) of 39.0 to 39.9 in adult, unspecified obesity type (HCC) [name] is currently in the action stage of change. As such, her goal is to continue with weight loss efforts. She has agreed to the Category 3 Plan.   Exercise goals: As is  Behavioral modification strategies: increasing lean protein intake, meal planning and cooking strategies and planning for success.  [name] has agreed to follow-up with our clinic in 3 weeks. She was informed of the importance of frequent follow-up visits to maximize her success with intensive lifestyle modifications for her multiple health conditions.  Objective:   Blood pressure 130/78, pulse 78, temperature 98.5 F (36.9 C), height 5\' 4"  (1.626 m), weight 231 lb (104.8 kg), last menstrual period 09/05/2020, SpO2 98 %. Body mass index is 39.65 kg/m.  General: Cooperative, alert, well developed, in no acute  distress. HEENT: Conjunctivae and lids unremarkable. Cardiovascular: Regular rhythm.  Lungs: Normal work of breathing. Neurologic: No focal deficits.   Lab Results  Component Value Date   CREATININE 0.92 09/15/2020   BUN 20 09/15/2020   NA 140 09/15/2020   K 3.5 09/15/2020   CL 107 (H) 09/15/2020   CO2 20 09/15/2020   Lab Results  Component Value Date   ALT 17 09/15/2020   AST 11 09/15/2020   ALKPHOS 43 (L) 09/15/2020   BILITOT <0.2 09/15/2020   Lab Results  Component Value Date   HGBA1C 5.0 06/13/2020   HGBA1C 5.1 12/21/2019   HGBA1C 5.1 05/04/2019   HGBA1C 5.1 01/29/2019   HGBA1C 5.2 09/08/2018   Lab Results  Component Value Date   INSULIN 18.2 06/13/2020   INSULIN 22.1 12/21/2019   INSULIN 13.2 05/04/2019   INSULIN 16.1 01/29/2019   INSULIN 16.9 09/08/2018   Lab Results  Component Value Date   TSH 2.810 09/15/2020   Lab Results  Component Value Date   CHOL 177 09/15/2020   HDL 58 09/15/2020   LDLCALC 105 (H) 09/15/2020   TRIG 72 09/15/2020   CHOLHDL 3.1 09/15/2020   Lab Results  Component Value Date   WBC 8.2 09/15/2020   HGB 13.5 09/15/2020   HCT 40.3 09/15/2020   MCV 90 09/15/2020   PLT 237 09/15/2020   No results found for: IRON, TIBC, FERRITIN  Attestation Statements:   Reviewed by clinician on day of visit: allergies, medications, problem list, medical history, surgical history, family history, social history, and previous encounter notes.  Time spent on visit including pre-visit chart review and post-visit care and charting was 34 minutes.   Coral Ceo, am acting as Location manager for Mina Marble, NP.  I have reviewed the above documentation for accuracy and completeness, and I agree with the above. -  Annice Jolly d. Louvinia Cumbo, NP-C

## 2020-10-10 ENCOUNTER — Other Ambulatory Visit: Payer: Self-pay | Admitting: Family

## 2020-10-10 DIAGNOSIS — G932 Benign intracranial hypertension: Secondary | ICD-10-CM

## 2020-10-10 DIAGNOSIS — I10 Essential (primary) hypertension: Secondary | ICD-10-CM

## 2020-10-10 MED FILL — LISINOPRIL-HCTZ 20-12.5 MG: 20-12.5 | 90 days supply | Qty: 90 | Fill #0

## 2020-10-19 ENCOUNTER — Emergency Department (HOSPITAL_COMMUNITY)
Admission: EM | Admit: 2020-10-19 | Discharge: 2020-10-19 | Disposition: A | Discharge status: Home / Self Care | Payer: 59 | Attending: Emergency Medicine | Admitting: Emergency Medicine

## 2020-10-19 ENCOUNTER — Other Ambulatory Visit (HOSPITAL_COMMUNITY): Payer: Self-pay | Admitting: Emergency Medicine

## 2020-10-19 ENCOUNTER — Other Ambulatory Visit: Payer: Self-pay

## 2020-10-19 ENCOUNTER — Encounter (HOSPITAL_COMMUNITY): Payer: Self-pay | Admitting: Emergency Medicine

## 2020-10-19 ENCOUNTER — Emergency Department (HOSPITAL_BASED_OUTPATIENT_CLINIC_OR_DEPARTMENT_OTHER)
Admit: 2020-10-19 | Discharge: 2020-10-19 | Disposition: A | Discharge status: Home / Self Care | Payer: 59 | Attending: Emergency Medicine | Admitting: Emergency Medicine

## 2020-10-19 ENCOUNTER — Emergency Department (HOSPITAL_COMMUNITY): Payer: 59

## 2020-10-19 DIAGNOSIS — M25562 Pain in left knee: Secondary | ICD-10-CM | POA: Diagnosis not present

## 2020-10-19 DIAGNOSIS — M25561 Pain in right knee: Secondary | ICD-10-CM

## 2020-10-19 DIAGNOSIS — X501XXA Overexertion from prolonged static or awkward postures, initial encounter: Secondary | ICD-10-CM | POA: Insufficient documentation

## 2020-10-19 DIAGNOSIS — I10 Essential (primary) hypertension: Secondary | ICD-10-CM | POA: Insufficient documentation

## 2020-10-19 DIAGNOSIS — R52 Pain, unspecified: Secondary | ICD-10-CM

## 2020-10-19 DIAGNOSIS — M7121 Synovial cyst of popliteal space [Baker], right knee: Secondary | ICD-10-CM | POA: Insufficient documentation

## 2020-10-19 DIAGNOSIS — Y92812 Truck as the place of occurrence of the external cause: Secondary | ICD-10-CM | POA: Diagnosis not present

## 2020-10-19 DIAGNOSIS — Z79899 Other long term (current) drug therapy: Secondary | ICD-10-CM | POA: Diagnosis not present

## 2020-10-19 DIAGNOSIS — J45909 Unspecified asthma, uncomplicated: Secondary | ICD-10-CM | POA: Insufficient documentation

## 2020-10-19 DIAGNOSIS — M1712 Unilateral primary osteoarthritis, left knee: Secondary | ICD-10-CM | POA: Diagnosis not present

## 2020-10-19 MED ORDER — NAPROXEN 500 MG PO TABS
500.0000 mg | ORAL_TABLET | Freq: Once | ORAL | Status: AC
  Administered 2020-10-19: 500 mg via ORAL
  Filled 2020-10-19: qty 1

## 2020-10-19 MED ORDER — IBUPROFEN 600 MG PO TABS
600.0000 mg | ORAL_TABLET | Freq: Three times a day (TID) | ORAL | 0 refills | Status: DC
Start: 2020-10-19 — End: 2020-11-18

## 2020-10-19 MED FILL — IBUPROFEN 600 MG TABLET: 600 | 5 days supply | Qty: 15 | Fill #0

## 2020-10-19 NOTE — Progress Notes (Signed)
Left lower extremity venous duplex has been completed. Preliminary results can be found in CV Proc through chart review.  Results were given to Dr. Kathrynn Humble.  10/19/20 10:19 AM [name] RVT

## 2020-10-19 NOTE — ED Triage Notes (Signed)
Patient was getting into her truck and her L knee popped, states it is painful to ambulate. Has been having L knee pain for about 2.5 weeks. Pain anterior and laterally right now in knee.

## 2020-10-19 NOTE — Discharge Instructions (Addendum)
You were seen in the ER for knee pain. The ultrasound does reveal bakers cyst and possibly some evidence of arthritis. Please see orthopedist in 1 week. RICE treatment advised.

## 2020-10-19 NOTE — ED Provider Notes (Addendum)
[facility] DEPT Provider Note   CSN: [id number] Arrival date & time: 10/19/20  2440     History Chief Complaint  Patient presents with  . Knee Pain    [name] is a 50 y.o. female.  HPI    [name] is a 50 year old woman who comes to the ER with chief complaint of pain to her left knee.  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  Margel reports that few days back she started having some discomfort over her left knee.  The initial evoking event was when she was moving a patient onto the CT table.  She did not think much about the pain and carried on.  Last night she started having increased pain in the same knee and when she was getting ready to go home, she felt a pop over the knee followed by excruciating pain.  She had hard time walking, and came to the ER for further assessment.  There is no history of osteoarthritis.  Pain is primarily located over the back of the knee and moves towards the lateral region.  There is no associated numbness, tingling.  Past Medical History:  Diagnosis Date  . Asthma   . Chiari I malformation ([place])   . Chiari malformation 1996   decompression   . High blood pressure   . Hypertension   . Idiopathic intracranial hypertension   . Lactose intolerance   . Pseudotumor cerebri   . Subclavian bypass stenosis ([place]) 11/97   right  . Swallowing difficulty     Patient Active Problem List   Diagnosis Date Noted  . Palpitations 07/12/2020  . Vitamin D deficiency 09/24/2018  . Insulin resistance 09/24/2018  . Class 2 severe obesity with serious comorbidity and body mass index (BMI) of 39.0 to 39.9 in adult [facility]) 09/08/2018  . Asthma 07/28/2018  . Hypokalemia 06/25/2017  . Rosacea 06/21/2017  . Obesity (BMI 30-39.9) 06/22/2016  . Allergic rhinitis 12/22/2015  . Other hyperlipidemia 12/22/2015  . Idiopathic intracranial hypertension 06/23/2015  . Essential hypertension 03/02/2015     Past Surgical History:  Procedure Laterality Date  . APPENDECTOMY    . chiari decompression    . GANGLION CYST EXCISION Right    wrist  . LAPAROSCOPIC APPENDECTOMY N/A 10/28/2015   Procedure: APPENDECTOMY LAPAROSCOPIC;  Surgeon: Armandina Gemma, MD;  Location: [facility];  Service: General;  Laterality: N/A;  . SUBCLAVIAN BYPASS GRAFT Right 07/1996  . WRIST FRACTURE SURGERY Right 02/2007     OB History    Gravida  0   Para  0   Term  0   Preterm  0   AB  0   Living  0     SAB  0   IAB  0   Ectopic  0   Multiple  0   Live Births  0           Family History  Problem Relation Age of Onset  . Cataracts Mother   . Diabetes Mother   . Hypertension Mother   . High Cholesterol Mother   . Obesity Mother   . Diabetes Father   . Hypertension Father   . Obesity Father     Social History   Tobacco Use  . Smoking status: Never Smoker  . Smokeless tobacco: Never Used  Vaping Use  . Vaping Use: Never used  Substance Use Topics  . Alcohol use: No    Alcohol/week: 0.0  standard drinks  . Drug use: No    Home Medications Prior to Admission medications   Medication Sig Start Date End Date Taking? Authorizing Provider  ibuprofen (ADVIL) 600 MG tablet Take 1 tablet (600 mg total) by mouth 3 (three) times daily. 10/19/20  Yes Varney Biles, MD  acetaZOLAMIDE (DIAMOX) 250 MG tablet Take 2 tablets (500 mg total) by mouth 2 (two) times daily. Must keep 11/23/20 appointment for further refills. 08/23/20   Lomax, Amy, NP  albuterol (VENTOLIN HFA) 108 (90 Base) MCG/ACT inhaler Inhale 2 puffs into the lungs every 4 (four) hours as needed for wheezing or shortness of breath. 08/04/19   [name] A, FNP  Calcium Carbonate-Vitamin D (CALCIUM + D PO) Take 1 tablet by mouth daily. Calcium 600 mg, vit D3 300 mg    [provider]  cetirizine (ZYRTEC) 5 MG tablet Take 5 mg by mouth daily.    [provider]  cholecalciferol (VITAMIN D3) 25 MCG (1000 UT) tablet  Take 2,000 Units by mouth daily.     [provider]  Cyanocobalamin (VITAMIN B 12 PO) Take 5,000 mcg by mouth daily.     [provider]  Echinacea 125 MG CAPS Take 125 mg by mouth daily.     [provider]  Liraglutide -Weight Management (SAXENDA) 18 MG/3ML SOPN Inject 0.5 mLs (3 mg total) into the skin daily. 04/25/20   Whitmire, Dawn W, FNP  lisinopril-hydrochlorothiazide (ZESTORETIC) 20-12.5 MG tablet TAKE 1 TABLET BY MOUTH DAILY. 10/10/20   [name] A, FNP  Magnesium 250 MG TABS Take 250 mg by mouth daily.     [provider]  MegaRed Omega-3 Krill Oil 500 MG CAPS Take 1 capsule by mouth daily.    [provider]  metroNIDAZOLE (METROCREAM) 0.75 % cream Apply topically 2 (two) times daily. Patient not taking: Reported on 09/15/2020 07/28/18   [name] A, FNP  montelukast (SINGULAIR) 10 MG tablet Take 1 tablet (10 mg total) by mouth at bedtime. 09/15/20   Sharion Balloon, FNP  Multiple Vitamin (MULTIVITAMIN) capsule Take 1 capsule by mouth daily.    [provider]  Oxymetazoline HCl (NASAL SPRAY NA) Place 2 sprays into the nose as needed (CONGESTION).     [provider]  potassium chloride (KLOR-CON) 10 MEQ tablet Take 1 tablet (10 mEq total) by mouth daily. 09/15/20   Sharion Balloon, FNP  UNIFINE PENTIPS 32G X 4 MM MISC USE WITH SAXENDA TO INJECT INTO SKIN DAILY 05/03/20   Whitmire, Arrie Aran W, FNP    Allergies    Codeine, Lyrica [pregabalin], Nyquil multi-symptom [pseudoeph-doxylamine-dm-apap], and Sulfa antibiotics  Review of Systems   Review of Systems  Constitutional: Positive for activity change.  Musculoskeletal: Positive for arthralgias.    Physical Exam Updated Vital Signs BP 127/80 (BP Location: Left Arm)   Pulse 91   Temp 98.3 F (36.8 C) (Oral)   Resp 18   SpO2 100%   Physical Exam Vitals and nursing note reviewed.  Constitutional:      Appearance: She is well-developed.  HENT:     Head:  Normocephalic and atraumatic.  Eyes:     Extraocular Movements: EOM normal.  Cardiovascular:     Rate and Rhythm: Normal rate.  Pulmonary:     Effort: Pulmonary effort is normal.  Abdominal:     General: Bowel sounds are normal.  Musculoskeletal:        General: Tenderness present. No swelling or deformity.  Cervical back: Normal range of motion and neck supple.     Comments: Anterior and posterior drawer's test are negative.  No discomfort with vargus maneuver.  Positive valgus maneuver Positive tenderness over the popliteal region without any palpable nodule  Skin:    General: Skin is warm and dry.  Neurological:     Mental Status: She is alert and oriented to person, place, and time.     ED Results / Procedures / Treatments   Labs (all labs ordered are listed, but only abnormal results are displayed) Labs Reviewed - No data to display  EKG None  Radiology DG Knee Complete 4 Views Left  Result Date: 10/19/2020 CLINICAL DATA:  Left knee popping.  Left knee pain. EXAM: LEFT KNEE - COMPLETE 4+ VIEW COMPARISON:  No prior. FINDINGS: Mild patellofemoral and medial compartment degenerative change. No acute bony or joint abnormality. Tiny well-circumscribed density noted the distal femur most likely tiny bone island. Tiny knee joint effusion cannot be excluded. IMPRESSION: Mild patellofemoral and medial compartment degenerative change. Tiny joint effusion cannot be excluded. No acute bony abnormality. Electronically Signed   By: Marcello Moores  Register   On: 10/19/2020 10:18   VAS [name] LOWER EXTREMITY VENOUS (DVT) (ONLY [facility] & [facility] 7a-7p)  Result Date: 10/19/2020  Lower Venous DVT Study Indications: Pain.  Risk Factors: Trauma. Limitations: Poor ultrasound/tissue interface. Comparison Study: No prior studies. Performing Technologist: Oliver Hum RVT  Examination Guidelines: A complete evaluation includes B-mode imaging, spectral Doppler, color Doppler, and power Doppler as needed of all  accessible portions of each vessel. Bilateral testing is considered an integral part of a complete examination. Limited examinations for reoccurring indications may be performed as noted. The reflux portion of the exam is performed with the patient in reverse Trendelenburg.  +-----+---------------+---------+-----------+----------+--------------+ RIGHTCompressibilityPhasicitySpontaneityPropertiesThrombus Aging +-----+---------------+---------+-----------+----------+--------------+ CFV  Full           Yes      Yes                                 +-----+---------------+---------+-----------+----------+--------------+   +---------+---------------+---------+-----------+----------+--------------+ LEFT     CompressibilityPhasicitySpontaneityPropertiesThrombus Aging +---------+---------------+---------+-----------+----------+--------------+ CFV      Full           Yes      Yes                                 +---------+---------------+---------+-----------+----------+--------------+ SFJ      Full                                                        +---------+---------------+---------+-----------+----------+--------------+ FV Prox  Full                                                        +---------+---------------+---------+-----------+----------+--------------+ FV Mid   Full                                                        +---------+---------------+---------+-----------+----------+--------------+  FV DistalFull                                                        +---------+---------------+---------+-----------+----------+--------------+ PFV      Full                                                        +---------+---------------+---------+-----------+----------+--------------+ POP      Full           Yes      Yes                                 +---------+---------------+---------+-----------+----------+--------------+ PTV      Full                                                         +---------+---------------+---------+-----------+----------+--------------+ PERO     Full                                                        +---------+---------------+---------+-----------+----------+--------------+     Summary: RIGHT: - No evidence of common femoral vein obstruction.  LEFT: - There is no evidence of deep vein thrombosis in the lower extremity.  - A cystic structure, measuring 1.7cm high by 1.7cm wide by 3cm long, is found in the popliteal fossa.  *See table(s) above for measurements and observations.    Preliminary     Procedures Procedures   Medications Ordered in ED Medications  naproxen (NAPROSYN) tablet 500 mg (500 mg Oral Given 10/19/20 1023)    ED Course  I have reviewed the triage vital signs and the nursing notes.  Pertinent labs & imaging results that were available during my care of the patient were reviewed by me and considered in my medical decision making (see chart for details).    MDM Rules/Calculators/A&P                          50 year old woman comes into the ER with a chief complaint of acute knee pain.  No prior history of injury or osteoarthritis to that knee.  She heard a large pop prior to the pain becoming excruciating.  Clinical concerns are for soft tissue injury.  X-rays reveal mild arthritis at best.  Ultrasound was ordered with concerns for ruptured Baker's cyst, but it is showing a small Baker's cyst which I do not think is contributing to the severe pain.  I have reviewed the x-rays and they do not show anything acutely concerning. Plan is for her to follow-up with orthopedist to evaluate for soft tissue injury or meniscal tear.   RICE treatment advised.  Crutches, knee immobilizer provided for severe pain. Final Clinical Impression(s) / ED Diagnoses Final diagnoses:  Acute pain of right knee  Baker's cyst of knee, right    Rx / DC Orders ED Discharge Orders          Ordered    ibuprofen (ADVIL) 600 MG tablet  3 times daily        10/19/20 [place], Che Rachal, MD 10/19/20 1704    Varney Biles, MD 10/19/20 1704

## 2020-10-21 DIAGNOSIS — M25562 Pain in left knee: Secondary | ICD-10-CM | POA: Diagnosis not present

## 2020-10-24 ENCOUNTER — Encounter (INDEPENDENT_AMBULATORY_CARE_PROVIDER_SITE_OTHER): Payer: Self-pay | Admitting: Adult Health

## 2020-10-24 ENCOUNTER — Other Ambulatory Visit: Payer: Self-pay

## 2020-10-24 ENCOUNTER — Ambulatory Visit (INDEPENDENT_AMBULATORY_CARE_PROVIDER_SITE_OTHER): Payer: 59 | Admitting: Adult Health

## 2020-10-24 VITALS — BP 125/78 | HR 80 | Temp 97.6°F | Ht 64.0 in | Wt 231.0 lb

## 2020-10-24 DIAGNOSIS — M25562 Pain in left knee: Secondary | ICD-10-CM | POA: Diagnosis not present

## 2020-10-24 DIAGNOSIS — E8881 Metabolic syndrome: Secondary | ICD-10-CM | POA: Diagnosis not present

## 2020-10-24 DIAGNOSIS — R002 Palpitations: Secondary | ICD-10-CM | POA: Diagnosis not present

## 2020-10-24 DIAGNOSIS — Z6839 Body mass index (BMI) 39.0-39.9, adult: Secondary | ICD-10-CM

## 2020-10-25 NOTE — Progress Notes (Signed)
Chief Complaint:   OBESITY [name] is here to discuss her progress with her obesity treatment plan along with follow-up of her obesity related diagnoses. [name] is on the Category 3 Plan and states she is following her eating plan approximately 50% of the time. [name] states she is doing 0 minutes 0 times per week.  Today's visit was #: 54 Starting weight: 250 lbs Starting date: 09/08/2018 Today's weight: 231 lbs Today's date: 10/24/2020 Total lbs lost to date: 19 lbs Total lbs lost since last in-office visit: 0  Interim History: [name] reports acute left knee pain. She was seen in the ED on 10/19/2020. She received left knee injection with [name], Dr. Erroll Luna on 10/21/2020. She is safely ambulating with 1 crutch. She is tolerating Saxenda 1.2 mg daily.  Subjective:   1. Acute pain of left knee [name] has acute left knee pain and was seen in the ED. I reviewed imaging and ED notes with patient. She was seen by [name]- Dr. Erroll Luna on 10/21/2020. She received injection and is ambulating with 1 crutch.   2. Palpitations [name] denies recent palpitations. She denies tobacco/vape use. Her father and paternal aunt both have A-fib. Her father has a PPM.  3. Insulin resistance [name]'s insulin level has been greater than goal at last several checks. She is on Saxenda for obesity.  Lab Results  Component Value Date   INSULIN 18.2 06/13/2020   INSULIN 22.1 12/21/2019   INSULIN 13.2 05/04/2019   INSULIN 16.1 01/29/2019   INSULIN 16.9 09/08/2018   Lab Results  Component Value Date   HGBA1C 5.0 06/13/2020    Assessment/Plan:   1. Acute pain of left knee Follow up with orthopedic specialist as directed.  2. Palpitations Follow up with cardiologist.  3. Insulin resistance Nida will continue to work on weight loss, exercise, and decreasing simple carbohydrates to help decrease the risk of diabetes. [name] agreed to follow-up with [name] as directed to closely monitor her progress. Increase  protein intake, limit simple carbs, and continue GLP-1 therapy.  4. Class 2 severe obesity with serious comorbidity and body mass index (BMI) of 39.0 to 39.9 in adult, unspecified obesity type (HCC) [name] is currently in the action stage of change. As such, her goal is to continue with weight loss efforts. She has agreed to the Category 3 Plan.   Continue Saxenda 1.2mg  QD- does not need RF today.  Exercise goals: No exercise has been prescribed at this time.  Behavioral modification strategies: increasing lean protein intake, decreasing simple carbohydrates, meal planning and cooking strategies and planning for success.  [name] has agreed to follow-up with our clinic in 3 weeks. She was informed of the importance of frequent follow-up visits to maximize her success with intensive lifestyle modifications for her multiple health conditions.   Objective:   Blood pressure 125/78, pulse 80, temperature 97.6 F (36.4 C), height 5\' 4"  (1.626 m), weight 231 lb (104.8 kg), SpO2 98 %. Body mass index is 39.65 kg/m.  General: Cooperative, alert, well developed, in no acute distress. HEENT: Conjunctivae and lids unremarkable. Cardiovascular: Regular rhythm.  Lungs: Normal work of breathing. Neurologic: No focal deficits.   Lab Results  Component Value Date   CREATININE 0.92 09/15/2020   BUN 20 09/15/2020   NA 140 09/15/2020   K 3.5 09/15/2020   CL 107 (H) 09/15/2020   CO2 20 09/15/2020   Lab Results  Component Value Date   ALT 17 09/15/2020   AST 11 09/15/2020  ALKPHOS 43 (L) 09/15/2020   BILITOT <0.2 09/15/2020   Lab Results  Component Value Date   HGBA1C 5.0 06/13/2020   HGBA1C 5.1 12/21/2019   HGBA1C 5.1 05/04/2019   HGBA1C 5.1 01/29/2019   HGBA1C 5.2 09/08/2018   Lab Results  Component Value Date   INSULIN 18.2 06/13/2020   INSULIN 22.1 12/21/2019   INSULIN 13.2 05/04/2019   INSULIN 16.1 01/29/2019   INSULIN 16.9 09/08/2018   Lab Results  Component Value Date   TSH  2.810 09/15/2020   Lab Results  Component Value Date   CHOL 177 09/15/2020   HDL 58 09/15/2020   LDLCALC 105 (H) 09/15/2020   TRIG 72 09/15/2020   CHOLHDL 3.1 09/15/2020   Lab Results  Component Value Date   WBC 8.2 09/15/2020   HGB 13.5 09/15/2020   HCT 40.3 09/15/2020   MCV 90 09/15/2020   PLT 237 09/15/2020    Attestation Statements:   Reviewed by clinician on day of visit: allergies, medications, problem list, medical history, surgical history, family history, social history, and previous encounter notes.  Time spent on visit including pre-visit chart review and post-visit care and charting was 34 minutes.   Coral Ceo, am acting as Location manager for Mina Marble, NP.  I have reviewed the above documentation for accuracy and completeness, and I agree with the above. -  Caroline Longie d. Oviya Ammar, NP-C

## 2020-10-26 ENCOUNTER — Ambulatory Visit (INDEPENDENT_AMBULATORY_CARE_PROVIDER_SITE_OTHER): Payer: 59

## 2020-10-26 ENCOUNTER — Other Ambulatory Visit: Payer: Self-pay

## 2020-10-26 DIAGNOSIS — Z23 Encounter for immunization: Secondary | ICD-10-CM | POA: Diagnosis not present

## 2020-10-27 DIAGNOSIS — M25562 Pain in left knee: Secondary | ICD-10-CM | POA: Insufficient documentation

## 2020-11-09 NOTE — Progress Notes (Signed)
Cardiology Office Note   Date:  11/10/2020   ID:  [name], [name] Aug 08, 1971, MRN [id number]  PCP:  Sharion Balloon, FNP  Cardiologist:   No primary care provider on file. Referring:  Sharion Balloon, FNP  Chief Complaint  Patient presents with  . Palpitations      History of Present Illness: [name] is a 50 y.o. female who presents for evaluation of palpitations.  She has no past cardiac history.  She is referred for palpitations.  She did have a right aberrant subclavian years ago that was bypassed.  Otherwise she has not had any cardiac issues.  She has been getting palpitations.  This has been going on for some time.  It might last for a few seconds.  It is sporadic.  She cannot bring it on with activity.  She was drinking more caffeine but she really reduce that and does not notice any change.  There is no association with food, physical activity or certain time of day.  She is not describing sustained tacky arrhythmias.  She is not having any presyncope or syncope.  She is not having any new shortness of breath, PND or orthopnea.  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   Past Medical History:  Diagnosis Date  . Asthma   . Chiari I malformation ([place])   . Chiari malformation 1996   decompression   . High blood pressure   . Hypertension   . Idiopathic intracranial hypertension   . Lactose intolerance   . Pseudotumor cerebri   . Subclavian bypass stenosis ([place]) 11/97   right  . Swallowing difficulty     Past Surgical History:  Procedure Laterality Date  . APPENDECTOMY    . chiari decompression    . GANGLION CYST EXCISION Right    wrist  . LAPAROSCOPIC APPENDECTOMY N/A 10/28/2015   Procedure: APPENDECTOMY LAPAROSCOPIC;  Surgeon: Armandina Gemma, MD;  Location: [facility];  Service: General;  Laterality: N/A;  . SUBCLAVIAN BYPASS GRAFT Right 07/1996  . WRIST FRACTURE SURGERY Right 02/2007     Current Outpatient Medications  Medication Sig  Dispense Refill  . acetaZOLAMIDE (DIAMOX) 250 MG tablet Take 2 tablets (500 mg total) by mouth 2 (two) times daily. Must keep 11/23/20 appointment for further refills. 360 tablet 0  . albuterol (VENTOLIN HFA) 108 (90 Base) MCG/ACT inhaler Inhale 2 puffs into the lungs every 4 (four) hours as needed for wheezing or shortness of breath. 18 g 2  . Calcium Carbonate-Vitamin D (CALCIUM + D PO) Take 1 tablet by mouth daily. Calcium 600 mg, vit D3 300 mg    . cetirizine (ZYRTEC) 5 MG tablet Take 5 mg by mouth daily.    . cholecalciferol (VITAMIN D3) 25 MCG (1000 UT) tablet Take 2,000 Units by mouth daily.     . Cyanocobalamin (VITAMIN B 12 PO) Take 5,000 mcg by mouth daily.     . Echinacea 125 MG CAPS Take 125 mg by mouth daily.     Marland Kitchen ibuprofen (ADVIL) 600 MG tablet Take 1 tablet (600 mg total) by mouth 3 (three) times daily. 15 tablet 0  . Liraglutide -Weight Management (SAXENDA) 18 MG/3ML SOPN Inject 0.5 mLs (3 mg total) into the skin daily. 15 mL 0  . lisinopril-hydrochlorothiazide (ZESTORETIC) 20-12.5 MG tablet TAKE 1 TABLET BY MOUTH DAILY. 90 tablet 1  . Magnesium 250 MG TABS Take 250 mg by mouth daily.     Marland Kitchen  MegaRed Omega-3 Krill Oil 500 MG CAPS Take 1 capsule by mouth daily.    . metroNIDAZOLE (METROCREAM) 0.75 % cream Apply topically 2 (two) times daily. 45 g 1  . montelukast (SINGULAIR) 10 MG tablet Take 1 tablet (10 mg total) by mouth at bedtime. 90 tablet 1  . Multiple Vitamin (MULTIVITAMIN) capsule Take 1 capsule by mouth daily.    . Oxymetazoline HCl (NASAL SPRAY NA) Place 2 sprays into the nose as needed (CONGESTION).     Marland Kitchen UNIFINE PENTIPS 32G X 4 MM MISC USE WITH SAXENDA TO INJECT INTO SKIN DAILY 100 each 0  . potassium chloride (KLOR-CON) 20 MEQ tablet Take 1 tablet (20 mEq total) by mouth daily. 90 tablet 3   No current facility-administered medications for this visit.    Allergies:   Codeine, Lyrica [pregabalin], Nyquil multi-symptom [pseudoeph-doxylamine-dm-apap], and Sulfa  antibiotics    Social History:  The patient  reports that she has never smoked. She has never used smokeless tobacco. She reports that she does not drink alcohol and does not use drugs.   Family History:  The patient's  family history includes Atrial fibrillation in her father; Cataracts in her mother; Diabetes in her father and mother; High Cholesterol in her mother; Hypertension in her father and mother; Obesity in her father and mother.    ROS:  Please see the history of present illness.   Otherwise, review of systems are positive for none.   All other systems are reviewed and negative.    PHYSICAL EXAM: VS:  BP 120/74 (BP Location: Left Arm, Patient Position: Sitting)   Pulse 83   Ht 5\' 5"  (1.651 m)   Wt 238 lb (108 kg)   SpO2 99%   BMI 39.61 kg/m  , BMI Body mass index is 39.61 kg/m. GENERAL:  Well appearing HEENT:  Pupils equal round and reactive, fundi not visualized, oral mucosa unremarkable NECK:  No jugular venous distention, waveform within normal limits, carotid upstroke brisk and symmetric, no bruits, no thyromegaly LYMPHATICS:  No cervical, inguinal adenopathy LUNGS:  Clear to auscultation bilaterally BACK:  No CVA tenderness CHEST:  Unremarkable HEART:  PMI not displaced or sustained,S1 and S2 within normal limits, no S3, no S4, no clicks, no rubs, no murmurs ABD:  Flat, positive bowel sounds normal in frequency in pitch, no bruits, no rebound, no guarding, no midline pulsatile mass, no hepatomegaly, no splenomegaly EXT:  2 plus pulses throughout, no edema, no cyanosis no clubbing SKIN:  No rashes no nodules NEURO:  Cranial nerves II through XII grossly intact, motor grossly intact throughout PSYCH:  Cognitively intact, oriented to person place and time    EKG:  EKG is ordered today. The ekg ordered today demonstrates sinus rhythm, rate 83, axis within normal limits, intervals within normal limits, no acute ST-T wave changes, poor anterior R wave  progression.    Recent Labs: 09/15/2020: ALT 17; BUN 20; Creatinine, Ser 0.92; Hemoglobin 13.5; Platelets 237; Potassium 3.5; Sodium 140; TSH 2.810    Lipid Panel    Component Value Date/Time   CHOL 177 09/15/2020 1305   TRIG 72 09/15/2020 1305   HDL 58 09/15/2020 1305   CHOLHDL 3.1 09/15/2020 1305   LDLCALC 105 (H) 09/15/2020 1305      Wt Readings from Last 3 Encounters:  11/10/20 238 lb (108 kg)  10/24/20 231 lb (104.8 kg)  10/03/20 231 lb (104.8 kg)      Other studies Reviewed: Additional studies/ records that were reviewed today include:  Labs. Review of the above records demonstrates:  Please see elsewhere in the note.     ASSESSMENT AND PLAN:  PALPITATIONS:    She said she did have improvement when she was started on potassium sometime ago.  She has had low potassium and it is still borderline at 3.5.  I will have her increase her potassium and I will check a magnesium and a basic metabolic profile in about 2 weeks to 10 days.  We talked about getting a monitor.  She has a Fitbit at home he can do a blood pressure so she will check to see if that can do an EKG.  If not and knees are still bothering her after the increase in potassium I would probably order a 2-week event monitor.  She will let me know.  I think she probably has a structurally normal heart.  I am not strongly suspecting atrial fibrillation for sustained dysrhythmias but rather suspect PVCs or PACs.   Current medicines are reviewed at length with the patient today.  The patient does not have concerns regarding medicines.  The following changes have been made: As above  Labs/ tests ordered today include:   Orders Placed This Encounter  Procedures  . Basic Metabolic Panel (BMET)  . Magnesium  . EKG 12-Lead     Disposition:   FU with me as needed   Signed, Minus Breeding, MD  11/10/2020 1:08 PM    [facility]

## 2020-11-10 ENCOUNTER — Ambulatory Visit: Payer: 59 | Admitting: Cardiology

## 2020-11-10 ENCOUNTER — Encounter: Payer: Self-pay | Admitting: Gastroenterology

## 2020-11-10 ENCOUNTER — Encounter: Payer: Self-pay | Admitting: Cardiology

## 2020-11-10 ENCOUNTER — Other Ambulatory Visit: Payer: Self-pay | Admitting: Cardiology

## 2020-11-10 ENCOUNTER — Other Ambulatory Visit: Payer: Self-pay

## 2020-11-10 VITALS — BP 120/74 | HR 83 | Ht 65.0 in | Wt 238.0 lb

## 2020-11-10 DIAGNOSIS — Z79899 Other long term (current) drug therapy: Secondary | ICD-10-CM | POA: Diagnosis not present

## 2020-11-10 DIAGNOSIS — R002 Palpitations: Secondary | ICD-10-CM | POA: Diagnosis not present

## 2020-11-10 MED ORDER — POTASSIUM CHLORIDE CRYS ER 20 MEQ PO TBCR: 20.0000 meq | EXTENDED_RELEASE_TABLET | Freq: Every day | ORAL | 3 refills | Status: DC

## 2020-11-10 MED FILL — POTASSIUM CHLORIDE CRYS ER: 20 | 90 days supply | Qty: 90 | Fill #0

## 2020-11-10 NOTE — Patient Instructions (Signed)
Medication Instructions:  INCREASE- Potassium 20 meq by mouth daily  *If you need a refill on your cardiac medications before your next appointment, please call your pharmacy*   Lab Work: BMP and Magnesium 10 days  If you have labs (blood work) drawn today and your tests are completely normal, you will receive your results only by: Marland Kitchen MyChart Message (if you have MyChart) OR . A paper copy in the mail If you have any lab test that is abnormal or we need to change your treatment, we will call you to review the results.   Testing/Procedures: None Ordered   Follow-Up: At [facility], you and your health needs are our priority.  As part of our continuing mission to provide you with exceptional heart care, we have created designated Provider Care Teams.  These Care Teams include your primary Cardiologist (physician) and Advanced Practice Providers (APPs -  Physician Assistants and Nurse Practitioners) who all work together to provide you with the care you need, when you need it.  We recommend signing up for the patient portal called "MyChart".  Sign up information is provided on this After Visit Summary.  MyChart is used to connect with patients for Virtual Visits (Telemedicine).  Patients are able to view lab/test results, encounter notes, upcoming appointments, etc.  Non-urgent messages can be sent to your provider as well.   To learn more about what you can do with MyChart, go to [email or web address].    Your next appointment:   As Needed

## 2020-11-14 ENCOUNTER — Encounter (INDEPENDENT_AMBULATORY_CARE_PROVIDER_SITE_OTHER): Payer: Self-pay | Admitting: Adult Health

## 2020-11-14 ENCOUNTER — Other Ambulatory Visit: Payer: Self-pay

## 2020-11-14 ENCOUNTER — Ambulatory Visit (INDEPENDENT_AMBULATORY_CARE_PROVIDER_SITE_OTHER): Payer: 59 | Admitting: Adult Health

## 2020-11-14 VITALS — BP 123/80 | HR 87 | Temp 97.7°F | Ht 64.0 in | Wt 234.0 lb

## 2020-11-14 DIAGNOSIS — R002 Palpitations: Secondary | ICD-10-CM

## 2020-11-14 DIAGNOSIS — I10 Essential (primary) hypertension: Secondary | ICD-10-CM

## 2020-11-14 DIAGNOSIS — Z6841 Body Mass Index (BMI) 40.0 and over, adult: Secondary | ICD-10-CM | POA: Diagnosis not present

## 2020-11-15 NOTE — Progress Notes (Signed)
Chief Complaint:   OBESITY [name] is here to discuss her progress with her obesity treatment plan along with follow-up of her obesity related diagnoses. [name] is on the Category 3 Plan and states she is following her eating plan approximately 70% of the time. [name] states she is doing cardio 30 minutes 2 times per week.  Today's visit was #: 18 Starting weight: 250 lbs Starting date: 09/08/2018 Today's weight: 234 lbs Today's date: 11/14/2020 Total lbs lost to date: 16 lbs Total lbs lost since last in-office visit: 0  Interim History: Cardiology increased potassium from 10 meq to 20 meq on 11/10/2020. She will follow up with cardiology in 10 days for magnesium and BMP check.  She was started on Saxenda 04/2020. She has titrated up to 1.2 mg QD.  Bioimpedance shows water increase of 5 lbs. She will often have weight increase when she isn't well rested prior to OV. She just came off the night shift.   Subjective:   1. Essential hypertension [name]'s BP and HR are excellent at OV today. She is on Lisinopril/HCTZ 20-12.5 mg QD with potassium supplement 20 meq QD. Cardiology increased potassium from 10 meq to 20 meq on 11/10/2020.  BP Readings from Last 3 Encounters:  11/14/20 123/80  11/10/20 120/74  10/24/20 125/78    2. Palpitations 11/10/2020 cardiology increased potassium from 10 meq to 20 meq. She denies increased palpitation episodes.  Assessment/Plan:   1. Essential hypertension [name] is working on healthy weight loss and exercise to improve blood pressure control. We will watch for signs of hypotension as she continues her lifestyle modifications.  2. Palpitations Follow up with cardiology at the end of March for magnesium and BMP.  3. Class 3 severe obesity with serious comorbidity and body mass index (BMI) of 40.0 to 44.9 in adult, unspecified obesity type (HCC) [name] is currently in the action stage of change. As such, her goal is to continue with weight loss efforts. She has  agreed to the Category 3 Plan.   Exercise goals: As is  Behavioral modification strategies: increasing lean protein intake, meal planning and cooking strategies and planning for success.  [name] has agreed to follow-up with our clinic in 3 weeks. She was informed of the importance of frequent follow-up visits to maximize her success with intensive lifestyle modifications for her multiple health conditions.   Objective:   Blood pressure 123/80, pulse 87, temperature 97.7 F (36.5 C), height 5\' 4"  (1.626 m), weight 234 lb (106.1 kg), SpO2 98 %. Body mass index is 40.17 kg/m.  General: Cooperative, alert, well developed, in no acute distress. HEENT: Conjunctivae and lids unremarkable. Cardiovascular: Regular rhythm.  Lungs: Normal work of breathing. Neurologic: No focal deficits.   Lab Results  Component Value Date   CREATININE 0.92 09/15/2020   BUN 20 09/15/2020   NA 140 09/15/2020   K 3.5 09/15/2020   CL 107 (H) 09/15/2020   CO2 20 09/15/2020   Lab Results  Component Value Date   ALT 17 09/15/2020   AST 11 09/15/2020   ALKPHOS 43 (L) 09/15/2020   BILITOT <0.2 09/15/2020   Lab Results  Component Value Date   HGBA1C 5.0 06/13/2020   HGBA1C 5.1 12/21/2019   HGBA1C 5.1 05/04/2019   HGBA1C 5.1 01/29/2019   HGBA1C 5.2 09/08/2018   Lab Results  Component Value Date   INSULIN 18.2 06/13/2020   INSULIN 22.1 12/21/2019   INSULIN 13.2 05/04/2019   INSULIN 16.1 01/29/2019   INSULIN 16.9  09/08/2018   Lab Results  Component Value Date   TSH 2.810 09/15/2020   Lab Results  Component Value Date   CHOL 177 09/15/2020   HDL 58 09/15/2020   LDLCALC 105 (H) 09/15/2020   TRIG 72 09/15/2020   CHOLHDL 3.1 09/15/2020   Lab Results  Component Value Date   WBC 8.2 09/15/2020   HGB 13.5 09/15/2020   HCT 40.3 09/15/2020   MCV 90 09/15/2020   PLT 237 09/15/2020     Attestation Statements:   Reviewed by clinician on day of visit: allergies, medications, problem list,  medical history, surgical history, family history, social history, and previous encounter notes.  Time spent on visit including pre-visit chart review and post-visit care and charting was 32 minutes.   Coral Ceo, am acting as Location manager for Mina Marble, NP.  I have reviewed the above documentation for accuracy and completeness, and I agree with the above. -  Hussain Maimone d. Caydn Justen, NP-C

## 2020-11-18 ENCOUNTER — Other Ambulatory Visit: Payer: Self-pay

## 2020-11-18 ENCOUNTER — Encounter: Payer: Self-pay | Admitting: Family Medicine

## 2020-11-18 ENCOUNTER — Ambulatory Visit: Payer: 59 | Admitting: Family Medicine

## 2020-11-18 VITALS — BP 133/77 | HR 73 | Temp 97.9°F | Ht 64.0 in | Wt 239.4 lb

## 2020-11-18 DIAGNOSIS — W5503XA Scratched by cat, initial encounter: Secondary | ICD-10-CM

## 2020-11-18 DIAGNOSIS — S60921A Unspecified superficial injury of right hand, initial encounter: Secondary | ICD-10-CM | POA: Diagnosis not present

## 2020-11-18 MED ORDER — AMOXICILLIN-POT CLAVULANATE 875-125 MG PO TABS: 1.0000 | ORAL_TABLET | Freq: Two times a day (BID) | ORAL | 0 refills | Status: AC

## 2020-11-18 NOTE — Progress Notes (Signed)
Acute Office Visit  Subjective:    Patient ID: [name], female    DOB: [birth year], 50 y.o.   MRN: [id number]  Chief Complaint  Patient presents with  . Abrasion    HPI Patient is in today for a cat scratch. She was scratched on the palmer side of her right hand yesterday by her cat. The area around the scratch is a little swollen and tender. She denies drainage, fever, body aches, or chills.    Past Medical History:  Diagnosis Date  . Asthma   . Chiari I malformation ([place])   . Chiari malformation 1996   decompression   . High blood pressure   . Hypertension   . Idiopathic intracranial hypertension   . Lactose intolerance   . Pseudotumor cerebri   . Subclavian bypass stenosis ([place]) 11/97   right  . Swallowing difficulty     Past Surgical History:  Procedure Laterality Date  . APPENDECTOMY    . chiari decompression    . GANGLION CYST EXCISION Right    wrist  . LAPAROSCOPIC APPENDECTOMY N/A 10/28/2015   Procedure: APPENDECTOMY LAPAROSCOPIC;  Surgeon: Armandina Gemma, MD;  Location: [facility];  Service: General;  Laterality: N/A;  . SUBCLAVIAN BYPASS GRAFT Right 07/1996  . WRIST FRACTURE SURGERY Right 02/2007    Family History  Problem Relation Age of Onset  . Cataracts Mother   . Diabetes Mother   . Hypertension Mother   . High Cholesterol Mother   . Obesity Mother   . Diabetes Father   . Hypertension Father   . Obesity Father   . Atrial fibrillation Father     Social History   Socioeconomic History  . Marital status: Married    Spouse name: Charlotte Crumb  . Number of children: 0  . Years of education: 2  . Highest education level: Not on file  Occupational History  . Occupation: CT Engineer, production: [employer]  Tobacco Use  . Smoking status: Never Smoker  . Smokeless tobacco: Never Used  Vaping Use  . Vaping Use: Never used  Substance and Sexual Activity  . Alcohol use: No    Alcohol/week: 0.0 standard drinks  . Drug use: No  . Sexual  activity: Not on file  Other Topics Concern  . Not on file  Social History Narrative   Married, no children, lives at home   Caffeine use- 2-3 cups coffee 5 days a week   Social Determinants of Health   Financial Resource Strain: Not on file  Food Insecurity: Not on file  Transportation Needs: Not on file  Physical Activity: Not on file  Stress: Not on file  Social Connections: Not on file  Intimate Partner Violence: Not on file    Outpatient Medications Prior to Visit  Medication Sig Dispense Refill  . acetaZOLAMIDE (DIAMOX) 250 MG tablet Take 2 tablets (500 mg total) by mouth 2 (two) times daily. Must keep 11/23/20 appointment for further refills. 360 tablet 0  . albuterol (VENTOLIN HFA) 108 (90 Base) MCG/ACT inhaler Inhale 2 puffs into the lungs every 4 (four) hours as needed for wheezing or shortness of breath. 18 g 2  . Calcium Carbonate-Vitamin D (CALCIUM + D PO) Take 1 tablet by mouth daily. Calcium 600 mg, vit D3 300 mg    . cetirizine (ZYRTEC) 5 MG tablet Take 5 mg by mouth daily.    . cholecalciferol (VITAMIN D3) 25 MCG (1000 UT) tablet Take 2,000 Units by mouth  daily.     . Cyanocobalamin (VITAMIN B 12 PO) Take 5,000 mcg by mouth daily.     . Echinacea 125 MG CAPS Take 125 mg by mouth daily.     . Liraglutide -Weight Management (SAXENDA) 18 MG/3ML SOPN Inject 0.5 mLs (3 mg total) into the skin daily. 15 mL 0  . lisinopril-hydrochlorothiazide (ZESTORETIC) 20-12.5 MG tablet TAKE 1 TABLET BY MOUTH DAILY. 90 tablet 1  . Magnesium 250 MG TABS Take 250 mg by mouth daily.     [place] Omega-3 Krill Oil 500 MG CAPS Take 1 capsule by mouth daily.    . montelukast (SINGULAIR) 10 MG tablet Take 1 tablet (10 mg total) by mouth at bedtime. 90 tablet 1  . Multiple Vitamin (MULTIVITAMIN) capsule Take 1 capsule by mouth daily.    . Oxymetazoline HCl (NASAL SPRAY NA) Place 2 sprays into the nose as needed (CONGESTION).     Marland Kitchen potassium chloride (KLOR-CON) 20 MEQ tablet Take 1 tablet (20  mEq total) by mouth daily. 90 tablet 3  . UNIFINE PENTIPS 32G X 4 MM MISC USE WITH SAXENDA TO INJECT INTO SKIN DAILY 100 each 0  . metroNIDAZOLE (METROCREAM) 0.75 % cream Apply topically 2 (two) times daily. (Patient not taking: Reported on 11/18/2020) 45 g 1  . ibuprofen (ADVIL) 600 MG tablet Take 1 tablet (600 mg total) by mouth 3 (three) times daily. 15 tablet 0   No facility-administered medications prior to visit.    Allergies  Allergen Reactions  . Codeine Other (See Comments)    "whole body turned bright red, swelled a little" with V codiene  . Lyrica [Pregabalin] Other (See Comments)    "suicidal thoughts"  . Nyquil Multi-Symptom [Pseudoeph-Doxylamine-Dm-Apap] Other (See Comments)    Turns skin red *all over body*, pt reports getting very hot   . Sulfa Antibiotics Other (See Comments)    "body turned red"    Review of Systems As per HPI.     Objective:    Physical Exam Vitals and nursing note reviewed.  Constitutional:      General: She is not in acute distress.    Appearance: Normal appearance. She is not ill-appearing, toxic-appearing or diaphoretic.  Pulmonary:     Effort: Pulmonary effort is normal. No respiratory distress.  Skin:    Comments: 2 cm shallow laceration to right lateral palmer aspect of right hand. Mild surround erythema, swelling, and slight warmth. No drainage or tenderness present.    Neurological:     General: No focal deficit present.     Mental Status: She is alert and oriented to person, place, and time.  Psychiatric:        Mood and Affect: Mood normal.        Behavior: Behavior normal.     BP 133/77   Pulse 73   Temp 97.9 F (36.6 C) (Temporal)   Ht 5\' 4"  (1.626 m)   Wt 239 lb 6 oz (108.6 kg)   BMI 41.09 kg/m  Wt Readings from Last 3 Encounters:  11/18/20 239 lb 6 oz (108.6 kg)  11/14/20 234 lb (106.1 kg)  11/10/20 238 lb (108 kg)    Health Maintenance Due  Topic Date Due  . COLONOSCOPY (Pts 45-53yrs Insurance coverage will  need to be confirmed)  Never done    There are no preventive care reminders to display for this patient.   Lab Results  Component Value Date   TSH 2.810 09/15/2020   Lab Results  Component Value  Date   WBC 8.2 09/15/2020   HGB 13.5 09/15/2020   HCT 40.3 09/15/2020   MCV 90 09/15/2020   PLT 237 09/15/2020   Lab Results  Component Value Date   NA 140 09/15/2020   K 3.5 09/15/2020   CO2 20 09/15/2020   GLUCOSE 88 09/15/2020   BUN 20 09/15/2020   CREATININE 0.92 09/15/2020   BILITOT <0.2 09/15/2020   ALKPHOS 43 (L) 09/15/2020   AST 11 09/15/2020   ALT 17 09/15/2020   PROT 6.6 09/15/2020   ALBUMIN 4.4 09/15/2020   CALCIUM 9.7 09/15/2020   ANIONGAP 11 10/28/2015   Lab Results  Component Value Date   CHOL 177 09/15/2020   Lab Results  Component Value Date   HDL 58 09/15/2020   Lab Results  Component Value Date   LDLCALC 105 (H) 09/15/2020   Lab Results  Component Value Date   TRIG 72 09/15/2020   Lab Results  Component Value Date   CHOLHDL 3.1 09/15/2020   Lab Results  Component Value Date   HGBA1C 5.0 06/13/2020       Assessment & Plan:   Shakeria was seen today for abrasion.  Diagnoses and all orders for this visit:  Cat scratch Start augmentin. Return to office for new or worsening symptoms, or if symptoms persist.  -     amoxicillin-clavulanate (AUGMENTIN) 875-125 MG tablet; Take 1 tablet by mouth 2 (two) times daily for 7 days.  The patient indicates understanding of these issues and agrees with the plan.  Gwenlyn Perking, FNP

## 2020-11-18 NOTE — Patient Instructions (Signed)
Cat-Scratch Disease, Adult Cat-scratch disease is a bacterial infection that is caused by a bite or scratch from an infected cat. It is sometimes called cat-scratch fever. The infection can cause a red bump, swollen glands, and flu-like symptoms. The infection does not spread (is not contagious) between humans. In most cases, the infection is mild and it clears up without treatment. However, a more severe infection may develop in people who have other illnesses or problems that weaken the body's disease-fighting system (immune system). What are the causes? This condition is caused by bacteria called Bartonella henselae. These bacteria may be present in the mouth or on the claws of cats or kittens, especially those that are younger than 14 year old. Cats do not look or act sick when they have this infection. The bacteria can spread to humans through:  A bite or scratch.  Having contact with an infected cat and then touching your eyes or mouth. What increases the risk? You are more likely to develop this condition if:  You own or interact with a cat.  You have a weakened immune system. Your immune system may be weakened if: ? You are pregnant. ? You have certain conditions like cancer, HIV, or AIDS. ? You received a donated organ (transplant). What are the signs or symptoms? The first symptoms may be:  A bite or scratch that does not heal.  A red bump near the wound. The bump may be red, warm, and tender to the touch. Other symptoms may take a few weeks to develop. They may include:  Swollen, tender glands (lymph nodes) in the neck or under the arms.  Fever.  Rash.  Joint pain.  Headache.  Low energy.  Poor appetite.   How is this diagnosed? This condition is diagnosed based on your symptoms and your history of a scratch or bite from a cat. Your health care provider will examine your skin and look for swollen lymph nodes. You may have tests, such as:  A culture test. This  involves testing a sample of fluid or pus from your wound.  Blood tests.  A lymph node biopsy. This involves removing and testing a tissue sample from a swollen lymph node. How is this treated? Cat-scratch disease usually goes away without treatment in 2-4 months. Your health care provider may recommend home care, such as using a warm cloth (compress) and over-the-counter pain medicine. You may be prescribed antibiotic medicine if:  Your immune system is weakened.  Your symptoms cause discomfort. If you have a painful, swollen lymph node, it may need to be drained with a needle. (This is rare.) Follow these instructions at home:  Rest and return to your normal activities as told by your health care provider.  Take over-the-counter and prescription medicines only as told by your health care provider.  If you were prescribed an antibiotic medicine, take it as told by your health care provider. Do not stop taking the antibiotic even if you start to feel better.  Apply warm compresses to the area as told your health care provider.  Check your wound or swollen gland areas every day for signs of infection, such as: ? Redness, swelling, or pain. ? Fluid or blood. ? Warmth. ? Pus or a bad smell.  Keep all follow-up visits as told by your health care provider. This is important. How is this prevented?  Avoid being scratched or bitten while playing with cats. To do this, avoid playing roughly or taking food away from a  cat.  Wash your hands well after you have contact with a cat.  If you get scratched or bitten, wash the injured area as soon as possible with warm water and soap.  Do not let a cat lick your skin if you have any cuts, sores, or scratches.  Do not pick up or play with stray cats.  If you have an indoor cat, do not let your cat outside. Having contact with stray cats may make your cat more likely to have contact with the bacteria. Contact a health care provider if:  You  have redness, swelling, or pain around your wound.  You have fluid, blood, or pus coming from your wound.  Your wound is warm to the touch.  There is pus or a bad smell coming from your wound.  You have a fever.  Your symptoms get worse or do not get better at home. Get help right away if:  You have more swelling of your lymph nodes in your neck or under your arms.  You develop pain in your abdomen.  You develop a skin rash.  You feel dizzy or you faint.  You develop inflammation of your eye or vision problems.  You have pain in your bones.  You develop a stiff neck. Summary  Cat-scratch disease, sometimes called cat-scratch fever, is a bacterial infection that is caused by a bite or scratch from an infected cat.  The infection can cause a red bump, swollen glands, and flu-like symptoms.  Bacteria that cause this condition may be present in the mouth or on the claws of cats or kittens, especially those that are younger than 48 year old.  If you were prescribed an antibiotic medicine, take it as told by your health care provider. Do not stop taking the antibiotic even if you start to feel better. This information is not intended to replace advice given to you by your health care provider. Make sure you discuss any questions you have with your health care provider. Document Revised: 04/22/2020 Document Reviewed: 04/22/2020 Elsevier Patient Education  [place].

## 2020-11-23 ENCOUNTER — Other Ambulatory Visit: Payer: Self-pay

## 2020-11-23 ENCOUNTER — Ambulatory Visit: Payer: 59 | Admitting: Family Medicine

## 2020-11-23 ENCOUNTER — Encounter: Payer: Self-pay | Admitting: Family Medicine

## 2020-11-23 ENCOUNTER — Other Ambulatory Visit: Payer: Self-pay | Admitting: Family Medicine

## 2020-11-23 VITALS — BP 128/74 | HR 86 | Ht 65.0 in | Wt 243.0 lb

## 2020-11-23 DIAGNOSIS — G932 Benign intracranial hypertension: Secondary | ICD-10-CM

## 2020-11-23 MED ORDER — ACETAZOLAMIDE 250 MG PO TABS: 250.0000 mg | ORAL_TABLET | Freq: Two times a day (BID) | ORAL | 3 refills | Status: DC

## 2020-11-23 MED FILL — acetaZOLAMIDE 250 MG TABS: 250 | 90 days supply | Qty: 180 | Fill #0

## 2020-11-23 NOTE — Patient Instructions (Addendum)
Below is our plan:  We will decrease dose of Diamox to 250mg  twice daily. Please let your cardiologist know that we are decreasing dose so they can watch potassium levels closely. Continue follow up with PCP and healthy Weight and Wellness.   Please make sure you are staying well hydrated. I recommend 50-60 ounces daily. Well balanced diet and regular exercise encouraged. Consistent sleep schedule with 6-8 hours recommended.   Please continue follow up with care team as directed.   Follow up in 1 year, sooner pending ophthalmology exam if any concerns of optic nerve edema or increasing headaches.   You may receive a survey regarding today's visit. I encourage you to leave honest feed back as I do use this information to improve patient care. Thank you for seeing me today!     Idiopathic Intracranial Hypertension  Idiopathic intracranial hypertension (IIH) is a condition that increases pressure around the brain. The fluid that surrounds the brain and spinal cord (cerebrospinal fluid, or CSF) increases and causes the pressure. Idiopathic means that the cause of this condition is not known. IIH affects the brain and spinal cord (neurological disorder). If this condition is not treated, it can cause vision loss or blindness. What are the causes? The cause of this condition is not known. What increases the risk? The following factors may make you more likely to develop this condition:  Being very overweight (obese).  Being a female between the ages of 67 and 70 years old, who has not gone through menopause.  Taking certain medicines, such as birth control or steroids. What are the signs or symptoms? Symptoms of this condition include:  Headaches. This is the most common symptom.  Brief episodes of total blindness.  Double vision, blurred vision, or poor side (peripheral) vision.  Pain in the shoulders or neck.  Nausea and vomiting.  A sound like rushing water or a pulsing sound  within the ears (pulsatile tinnitus), or ringing in the ears. How is this diagnosed? This condition may be diagnosed based on:  Your symptoms and medical history.  Imaging tests of the brain, such as: ? CT scan. ? MRI. ? Magnetic resonance venogram (MRV) to check the veins.  Diagnostic lumbar puncture. This is a procedure to remove and examine a sample of cerebrospinal fluid. This procedure can determine whether too much fluid may be causing IIH.  A thorough eye exam to check for swelling or nerve damage in the eyes. How is this treated? Treatment for this condition depends on the symptoms. The goal of treatment is to decrease the pressure around your brain. Common treatments include:  Weight loss through healthy eating, salt restriction, and exercise, if you are overweight.  Medicines to decrease the production of spinal fluid and lower the pressure within your skull.  Medicines to prevent or treat headaches. Other treatments may include:  Surgery to place drains (shunts) in your brain for removing excess fluid.  Lumbar puncture to remove excess cerebrospinal fluid. Follow these instructions at home:  If you are overweight or obese, work with your health care provider to lose weight.  Take over-the-counter and prescription medicines only as told by your health care provider.  Ask your health care provider if the medicine prescribed to you requires you to avoid driving or using machinery.  Do not use any products that contain nicotine or tobacco, such as cigarettes, e-cigarettes, and chewing tobacco. If you need help quitting, ask your health care provider.  Keep all follow-up visits as told  by your health care provider. This is important. Contact a health care provider if: You have changes in your vision, such as:  Double vision.  Blurred vision.  Poor peripheral vision. Get help right away if: You have any of the following symptoms and they get worse or do not get  better:  Headaches.  Nausea.  Vomiting.  Sudden trouble seeing. Summary  Idiopathic intracranial hypertension (IIH) is a condition that increases pressure around the brain. The cause is not known (is idiopathic).  The most common symptom of IIH is headaches. Vision changes, pain in the shoulders or neck, nausea, and vomiting may also occur.  Treatment for this condition depends on your symptoms. The goal of treatment is to decrease the pressure around your brain.  If you are overweight or obese, work with your health care provider to lose weight.  Take over-the-counter and prescription medicines only as told by your health care provider. This information is not intended to replace advice given to you by your health care provider. Make sure you discuss any questions you have with your health care provider. Document Revised: 08/01/2019 Document Reviewed: 08/01/2019 Elsevier Patient Education  2021 [employer].
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
## 2020-11-23 NOTE — Progress Notes (Signed)
Fax confirmation received for Dr. [name] [phone] and Dr. Minus Breeding [phone] last ofv note per AL/NP request.

## 2020-11-24 ENCOUNTER — Other Ambulatory Visit: Payer: 59

## 2020-11-24 DIAGNOSIS — R002 Palpitations: Secondary | ICD-10-CM | POA: Diagnosis not present

## 2020-11-24 DIAGNOSIS — Z79899 Other long term (current) drug therapy: Secondary | ICD-10-CM | POA: Diagnosis not present

## 2020-11-24 NOTE — Progress Notes (Signed)
I reviewed note and agree with plan.   Penni Bombard, MD 0/98/1191, 4:78 PM Certified in Neurology, Neurophysiology and Neuroimaging  [facility] [address] [place], [place] [address] [phone]

## 2020-11-24 NOTE — Progress Notes (Signed)
I reviewed note and agree with plan.   Penni Bombard, MD 0/35/4656, 8:12 PM Certified in Neurology, Neurophysiology and Neuroimaging  [facility] [address], [place], [place] [address] [phone]

## 2020-11-25 LAB — BASIC METABOLIC PANEL
BUN/Creatinine Ratio: 22 (ref 9–23)
BUN: 17 mg/dL (ref 6–24)
CO2: 18 mmol/L — ABNORMAL LOW (ref 20–29)
Calcium: 9.4 mg/dL (ref 8.7–10.2)
Chloride: 103 mmol/L (ref 96–106)
Creatinine, Ser: 0.79 mg/dL (ref 0.57–1.00)
Glucose: 80 mg/dL (ref 65–99)
Potassium: 3.9 mmol/L (ref 3.5–5.2)
Sodium: 136 mmol/L (ref 134–144)
eGFR: 93 mL/min/{1.73_m2} (ref >59)

## 2020-11-25 LAB — MAGNESIUM: Magnesium: 2 mg/dL (ref 1.6–2.3)

## 2020-12-05 ENCOUNTER — Encounter (INDEPENDENT_AMBULATORY_CARE_PROVIDER_SITE_OTHER): Payer: Self-pay | Admitting: Adult Health

## 2020-12-05 ENCOUNTER — Ambulatory Visit (INDEPENDENT_AMBULATORY_CARE_PROVIDER_SITE_OTHER): Payer: 59 | Admitting: Adult Health

## 2020-12-05 ENCOUNTER — Other Ambulatory Visit: Payer: Self-pay

## 2020-12-05 VITALS — BP 131/80 | HR 91 | Temp 97.0°F | Ht 65.0 in | Wt 230.0 lb

## 2020-12-05 DIAGNOSIS — I10 Essential (primary) hypertension: Secondary | ICD-10-CM

## 2020-12-05 DIAGNOSIS — Z6841 Body Mass Index (BMI) 40.0 and over, adult: Secondary | ICD-10-CM | POA: Diagnosis not present

## 2020-12-05 DIAGNOSIS — E876 Hypokalemia: Secondary | ICD-10-CM | POA: Diagnosis not present

## 2020-12-06 NOTE — Progress Notes (Signed)
Chief Complaint:   OBESITY [name] is here to discuss her progress with her obesity treatment plan along with follow-up of her obesity related diagnoses. [name] is on the Category 3 Plan and states she is following her eating plan approximately 83% of the time. [name] states she is going to the gym 30 minutes 2 times per week.  Today's visit was #: 44 Starting weight: 250 lbs Starting date: 09/08/2018 Today's weight: 230 lbs Today's date: 12/05/2020 Total lbs lost to date: 20 lbs Total lbs lost since last in-office visit: 4 lbs  Interim History: [name] was seen by neurology 11/23/2020-Acetazolamide was decreased to 250 mg BID (previously 500 mg BID).  She is on Saxenda 1.2 mg QD and tolerating it well.  Subjective:   1. Hypokalemia [name]'s 11/24/2020 CMP revealed a potassium level of 3.9. She is currently on potassium chloride 20 meq QD-this was recently increased by cardiology.   Ref. Range 11/24/2020 10:49  Potassium Latest Ref Range: 3.5 - 5.2 mmol/L 3.9   2. Essential hypertension [name]'s BP is stable. She has taken daily lisinopril/HCTZ 20-12.5 mg. She denies cardiac symptoms. She is purchasing a smart watch with EKG capability this week.  BP Readings from Last 3 Encounters:  12/05/20 131/80  11/23/20 128/74  11/18/20 133/77    Assessment/Plan:   1. Hypokalemia Continue potassium replacement per cardiology.  2. Essential hypertension [name] is working on healthy weight loss and exercise to improve blood pressure control. We will watch for signs of hypotension as she continues her lifestyle modifications. Continue daily lisinopril/HCTZ 20-12.5 mg.  3. Current BMI 39.6 [name] is currently in the action stage of change. As such, her goal is to continue with weight loss efforts. She has agreed to the Category 3 Plan.   Exercise goals: As is  Behavioral modification strategies: increasing lean protein intake, meal planning and cooking strategies and planning for success.  [name] has  agreed to follow-up with our clinic in 4 weeks. She was informed of the importance of frequent follow-up visits to maximize her success with intensive lifestyle modifications for her multiple health conditions.   Objective:   Blood pressure 131/80, pulse 91, temperature (!) 97 F (36.1 C), height 5\' 5"  (1.651 m), weight 230 lb (104.3 kg), SpO2 95 %. Body mass index is 38.27 kg/m.  General: Cooperative, alert, well developed, in no acute distress. HEENT: Conjunctivae and lids unremarkable. Cardiovascular: Regular rhythm.  Lungs: Normal work of breathing. Neurologic: No focal deficits.   Lab Results  Component Value Date   CREATININE 0.79 11/24/2020   BUN 17 11/24/2020   NA 136 11/24/2020   K 3.9 11/24/2020   CL 103 11/24/2020   CO2 18 (L) 11/24/2020   Lab Results  Component Value Date   ALT 17 09/15/2020   AST 11 09/15/2020   ALKPHOS 43 (L) 09/15/2020   BILITOT <0.2 09/15/2020   Lab Results  Component Value Date   HGBA1C 5.0 06/13/2020   HGBA1C 5.1 12/21/2019   HGBA1C 5.1 05/04/2019   HGBA1C 5.1 01/29/2019   HGBA1C 5.2 09/08/2018   Lab Results  Component Value Date   INSULIN 18.2 06/13/2020   INSULIN 22.1 12/21/2019   INSULIN 13.2 05/04/2019   INSULIN 16.1 01/29/2019   INSULIN 16.9 09/08/2018   Lab Results  Component Value Date   TSH 2.810 09/15/2020   Lab Results  Component Value Date   CHOL 177 09/15/2020   HDL 58 09/15/2020   LDLCALC 105 (H) 09/15/2020   TRIG 72  09/15/2020   CHOLHDL 3.1 09/15/2020   Lab Results  Component Value Date   WBC 8.2 09/15/2020   HGB 13.5 09/15/2020   HCT 40.3 09/15/2020   MCV 90 09/15/2020   PLT 237 09/15/2020    Attestation Statements:   Reviewed by clinician on day of visit: allergies, medications, problem list, medical history, surgical history, family history, social history, and previous encounter notes.  Time spent on visit including pre-visit chart review and post-visit care and charting was 32 minutes.   Coral Ceo, am acting as Location manager for Mina Marble, NP.  I have reviewed the above documentation for accuracy and completeness, and I agree with the above. -  Breydon Senters d. Shahil Speegle, NP-C

## 2020-12-12 ENCOUNTER — Encounter: Payer: Self-pay | Admitting: Family Medicine

## 2020-12-12 ENCOUNTER — Encounter (HOSPITAL_COMMUNITY): Payer: Self-pay

## 2020-12-12 ENCOUNTER — Telehealth: Payer: Self-pay

## 2020-12-12 ENCOUNTER — Other Ambulatory Visit: Payer: Self-pay

## 2020-12-12 ENCOUNTER — Inpatient Hospital Stay (HOSPITAL_COMMUNITY)
Admission: EM | Admit: 2020-12-12 | Discharge: 2020-12-14 | DRG: 175 | Disposition: A | Discharge status: Home / Self Care | Payer: 59 | Attending: Internal Medicine | Admitting: Internal Medicine

## 2020-12-12 ENCOUNTER — Ambulatory Visit (HOSPITAL_COMMUNITY)
Admission: RE | Admit: 2020-12-12 | Discharge: 2020-12-12 | Disposition: A | Discharge status: Home / Self Care | Payer: 59 | Source: Ambulatory Visit | Attending: Family Medicine | Admitting: Family Medicine

## 2020-12-12 ENCOUNTER — Encounter (HOSPITAL_COMMUNITY): Payer: Self-pay | Admitting: Emergency Medicine

## 2020-12-12 ENCOUNTER — Ambulatory Visit (INDEPENDENT_AMBULATORY_CARE_PROVIDER_SITE_OTHER): Payer: 59

## 2020-12-12 ENCOUNTER — Ambulatory Visit: Payer: 59 | Admitting: Family Medicine

## 2020-12-12 VITALS — BP 113/86 | HR 124 | Temp 96.3°F | Ht 65.0 in | Wt 231.0 lb

## 2020-12-12 DIAGNOSIS — Z20822 Contact with and (suspected) exposure to covid-19: Secondary | ICD-10-CM | POA: Diagnosis not present

## 2020-12-12 DIAGNOSIS — R0609 Other forms of dyspnea: Secondary | ICD-10-CM

## 2020-12-12 DIAGNOSIS — I2699 Other pulmonary embolism without acute cor pulmonale: Secondary | ICD-10-CM | POA: Diagnosis not present

## 2020-12-12 DIAGNOSIS — I2602 Saddle embolus of pulmonary artery with acute cor pulmonale: Principal | ICD-10-CM

## 2020-12-12 DIAGNOSIS — R0600 Dyspnea, unspecified: Secondary | ICD-10-CM

## 2020-12-12 DIAGNOSIS — G932 Benign intracranial hypertension: Secondary | ICD-10-CM | POA: Diagnosis not present

## 2020-12-12 DIAGNOSIS — Z888 Allergy status to other drugs, medicaments and biological substances status: Secondary | ICD-10-CM | POA: Diagnosis not present

## 2020-12-12 DIAGNOSIS — E7849 Other hyperlipidemia: Secondary | ICD-10-CM | POA: Diagnosis present

## 2020-12-12 DIAGNOSIS — R071 Chest pain on breathing: Secondary | ICD-10-CM | POA: Insufficient documentation

## 2020-12-12 DIAGNOSIS — Z882 Allergy status to sulfonamides status: Secondary | ICD-10-CM | POA: Diagnosis not present

## 2020-12-12 DIAGNOSIS — R0789 Other chest pain: Secondary | ICD-10-CM | POA: Diagnosis not present

## 2020-12-12 DIAGNOSIS — R000 Tachycardia, unspecified: Secondary | ICD-10-CM | POA: Diagnosis not present

## 2020-12-12 DIAGNOSIS — Z79899 Other long term (current) drug therapy: Secondary | ICD-10-CM | POA: Diagnosis not present

## 2020-12-12 DIAGNOSIS — E669 Obesity, unspecified: Secondary | ICD-10-CM | POA: Diagnosis not present

## 2020-12-12 DIAGNOSIS — Z885 Allergy status to narcotic agent status: Secondary | ICD-10-CM

## 2020-12-12 DIAGNOSIS — I10 Essential (primary) hypertension: Secondary | ICD-10-CM | POA: Diagnosis present

## 2020-12-12 DIAGNOSIS — J309 Allergic rhinitis, unspecified: Secondary | ICD-10-CM | POA: Diagnosis present

## 2020-12-12 DIAGNOSIS — Z8249 Family history of ischemic heart disease and other diseases of the circulatory system: Secondary | ICD-10-CM | POA: Diagnosis not present

## 2020-12-12 DIAGNOSIS — R0602 Shortness of breath: Secondary | ICD-10-CM | POA: Diagnosis not present

## 2020-12-12 DIAGNOSIS — E739 Lactose intolerance, unspecified: Secondary | ICD-10-CM | POA: Diagnosis present

## 2020-12-12 DIAGNOSIS — Z6837 Body mass index (BMI) 37.0-37.9, adult: Secondary | ICD-10-CM

## 2020-12-12 DIAGNOSIS — Z86711 Personal history of pulmonary embolism: Secondary | ICD-10-CM | POA: Diagnosis present

## 2020-12-12 DIAGNOSIS — I82431 Acute embolism and thrombosis of right popliteal vein: Secondary | ICD-10-CM | POA: Diagnosis present

## 2020-12-12 DIAGNOSIS — R778 Other specified abnormalities of plasma proteins: Secondary | ICD-10-CM | POA: Diagnosis present

## 2020-12-12 DIAGNOSIS — R7989 Other specified abnormal findings of blood chemistry: Secondary | ICD-10-CM | POA: Diagnosis not present

## 2020-12-12 LAB — CBC WITH DIFFERENTIAL/PLATELET
Abs Immature Granulocytes: 0.06 10*3/uL (ref 0.00–0.07)
Basophils Absolute: 0.1 10*3/uL (ref 0.0–0.1)
Basophils Relative: 0 %
Eosinophils Absolute: 0.1 10*3/uL (ref 0.0–0.5)
Eosinophils Relative: 1 %
HCT: 49.8 % — ABNORMAL HIGH (ref 36.0–46.0)
Hemoglobin: 16.4 g/dL — ABNORMAL HIGH (ref 12.0–15.0)
Immature Granulocytes: 0 %
Lymphocytes Relative: 16 %
Lymphs Abs: 2.7 10*3/uL (ref 0.7–4.0)
MCH: 30.5 pg (ref 26.0–34.0)
MCHC: 32.9 g/dL (ref 30.0–36.0)
MCV: 92.6 fL (ref 80.0–100.0)
Monocytes Absolute: 0.9 10*3/uL (ref 0.1–1.0)
Monocytes Relative: 5 %
Neutro Abs: 13.4 10*3/uL — ABNORMAL HIGH (ref 1.7–7.7)
Neutrophils Relative %: 78 %
Platelets: 224 10*3/uL (ref 150–400)
RBC: 5.38 MIL/uL — ABNORMAL HIGH (ref 3.87–5.11)
RDW: 12.8 % (ref 11.5–15.5)
WBC: 17.2 10*3/uL — ABNORMAL HIGH (ref 4.0–10.5)
nRBC: 0 % (ref 0.0–0.2)

## 2020-12-12 LAB — BRAIN NATRIURETIC PEPTIDE: B Natriuretic Peptide: 75.4 pg/mL (ref 0.0–100.0)

## 2020-12-12 LAB — BASIC METABOLIC PANEL
Anion gap: 9 (ref 5–15)
BUN: 16 mg/dL (ref 6–20)
CO2: 18 mmol/L — ABNORMAL LOW (ref 22–32)
Calcium: 9 mg/dL (ref 8.9–10.3)
Chloride: 111 mmol/L (ref 98–111)
Creatinine, Ser: 0.9 mg/dL (ref 0.44–1.00)
GFR, Estimated: >60 mL/min (ref >60)
Glucose, Bld: 105 mg/dL — ABNORMAL HIGH (ref 70–99)
Potassium: 3.8 mmol/L (ref 3.5–5.1)
Sodium: 138 mmol/L (ref 135–145)

## 2020-12-12 LAB — TROPONIN I (HIGH SENSITIVITY)
Troponin I (High Sensitivity): 264 ng/L (ref <18)
Troponin I (High Sensitivity): 202 ng/L (ref <18)

## 2020-12-12 LAB — PROTIME-INR
INR: 1 (ref 0.8–1.2)
Prothrombin Time: 13.2 seconds (ref 11.4–15.2)

## 2020-12-12 LAB — APTT: aPTT: 33 seconds (ref 24–36)

## 2020-12-12 MED ORDER — IOHEXOL 350 MG/ML SOLN
100.0000 mL | Freq: Once | INTRAVENOUS | Status: AC | PRN
  Administered 2020-12-12: 100 mL via INTRAVENOUS

## 2020-12-12 MED ORDER — VITAMIN D 25 MCG (1000 UNIT) PO TABS
2000.0000 [IU] | ORAL_TABLET | Freq: Every day | ORAL | Status: DC
  Administered 2020-12-13 – 2020-12-14 (×2): 2000 [IU] via ORAL
  Filled 2020-12-12 (×2): qty 2

## 2020-12-12 MED ORDER — HEPARIN BOLUS VIA INFUSION
2400.0000 [IU] | Freq: Once | INTRAVENOUS | Status: AC
  Administered 2020-12-13: 2400 [IU] via INTRAVENOUS
  Filled 2020-12-12: qty 2400

## 2020-12-12 MED ORDER — HEPARIN (PORCINE) 25000 UT/250ML-% IV SOLN
1500.0000 [IU]/h | INTRAVENOUS | Status: DC
  Administered 2020-12-13: 1500 [IU]/h via INTRAVENOUS
  Filled 2020-12-12 (×3): qty 250

## 2020-12-12 MED ORDER — CHLORHEXIDINE GLUCONATE CLOTH 2 % EX PADS
6.0000 | MEDICATED_PAD | Freq: Every day | CUTANEOUS | Status: DC
  Administered 2020-12-13: 6 via TOPICAL

## 2020-12-12 MED ORDER — ACETAZOLAMIDE 250 MG PO TABS
250.0000 mg | ORAL_TABLET | Freq: Two times a day (BID) | ORAL | Status: DC
  Administered 2020-12-12 – 2020-12-14 (×4): 250 mg via ORAL
  Filled 2020-12-12 (×4): qty 1

## 2020-12-12 MED ORDER — MAGNESIUM 250 MG PO TABS: 250.0000 mg | ORAL_TABLET | Freq: Every day | ORAL | Status: DC

## 2020-12-12 MED ORDER — APIXABAN 5 MG PO TABS: 5.0000 mg | ORAL_TABLET | Freq: Two times a day (BID) | ORAL | Status: DC

## 2020-12-12 MED ORDER — LORATADINE 10 MG PO TABS
10.0000 mg | ORAL_TABLET | Freq: Every day | ORAL | Status: DC
  Administered 2020-12-13 – 2020-12-14 (×2): 10 mg via ORAL
  Filled 2020-12-12 (×2): qty 1

## 2020-12-12 MED ORDER — CALCIUM CARBONATE-VITAMIN D 500-200 MG-UNIT PO TABS
1.0000 | ORAL_TABLET | Freq: Every day | ORAL | Status: DC
  Administered 2020-12-13 – 2020-12-14 (×2): 1 via ORAL
  Filled 2020-12-12 (×2): qty 1

## 2020-12-12 MED ORDER — APIXABAN 5 MG PO TABS
10.0000 mg | ORAL_TABLET | Freq: Two times a day (BID) | ORAL | Status: DC
  Administered 2020-12-12: 10 mg via ORAL
  Filled 2020-12-12: qty 2

## 2020-12-12 MED ORDER — MONTELUKAST SODIUM 10 MG PO TABS
10.0000 mg | ORAL_TABLET | Freq: Every day | ORAL | Status: DC
  Administered 2020-12-12 – 2020-12-13 (×2): 10 mg via ORAL
  Filled 2020-12-12 (×2): qty 1

## 2020-12-12 MED ORDER — MAGNESIUM OXIDE 400 (241.3 MG) MG PO TABS
200.0000 mg | ORAL_TABLET | Freq: Every day | ORAL | Status: DC
  Administered 2020-12-13 – 2020-12-14 (×2): 200 mg via ORAL
  Filled 2020-12-12 (×2): qty 1

## 2020-12-12 MED ORDER — CALCIUM CITRATE-VITAMIN D 315-200 MG-UNIT PO TABS: 1.0000 | ORAL_TABLET | Freq: Every day | ORAL | Status: DC

## 2020-12-12 MED ORDER — ALBUTEROL SULFATE HFA 108 (90 BASE) MCG/ACT IN AERS: 2.0000 | INHALATION_SPRAY | RESPIRATORY_TRACT | Status: DC | PRN

## 2020-12-12 MED ORDER — POTASSIUM CHLORIDE CRYS ER 10 MEQ PO TBCR
10.0000 meq | EXTENDED_RELEASE_TABLET | Freq: Two times a day (BID) | ORAL | Status: DC
  Administered 2020-12-12 – 2020-12-14 (×4): 10 meq via ORAL
  Filled 2020-12-12 (×4): qty 1

## 2020-12-12 NOTE — Telephone Encounter (Signed)
FYI: Per Marjorie Smolder, report from [facility] has been reviewed and Jonelle Sidle has already spoken with Radiologist. Wants patient to go to the ED to be admitted.

## 2020-12-12 NOTE — Progress Notes (Addendum)
ANTICOAGULATION CONSULT NOTE - Initial Consult  Pharmacy Consult for Apixaban Indication: pulmonary embolus  Allergies  Allergen Reactions  . Codeine Other (See Comments)    "whole body turned bright red, swelled a little" with V codiene  . Lyrica [Pregabalin] Other (See Comments)    "suicidal thoughts"  . Nyquil Multi-Symptom [Pseudoeph-Doxylamine-Dm-Apap] Other (See Comments)    Turns skin red *all over body*, pt reports getting very hot   . Sulfa Antibiotics Other (See Comments)    "body turned red"    Patient Measurements:   Heparin Dosing Weight: 81.3 kg  Vital Signs: Temp: 98.3 F (36.8 C) (04/11 1528) Temp Source: Oral (04/11 1528) BP: 120/89 (04/11 1615) Pulse Rate: 107 (04/11 1615)  Labs: Recent Labs    12/12/20 1535  HGB 16.4*  HCT 49.8*  PLT 224    Estimated Creatinine Clearance: 104.4 mL/min (by C-G formula based on SCr of 0.79 mg/dL).   Medical History: Past Medical History:  Diagnosis Date  . Asthma   . Chiari I malformation ([place])   . Chiari malformation 1996   decompression   . High blood pressure   . Hypertension   . Idiopathic intracranial hypertension   . Lactose intolerance   . Pseudotumor cerebri   . Subclavian bypass stenosis ([place]) 11/97   right  . Swallowing difficulty     Medications:  Scheduled:  Infusions:  PRN:   Assessment: 50 yo female presents with new PE.  Pharmacy consulted to dose apixaban.  Goal of Therapy:  Therapeutic anticoagulation Monitor platelets by anticoagulation protocol: Yes   Plan:  Apixaban 10mg  po BID x 7 days, then 5mg  PO bid thereafter Will provide 30-day free coupon voucher and DOAC education prior to discharge  Peggyann Juba, PharmD, BCPS Pharmacy: [phone] 12/12/2020,4:36 PM    Addendum:  Consulted by admitting MD to transition to IV heparin drip. Apixaban 10mg  given at 17:34  Plan: - 12hr after apixaban dose was given, will give heparin bolus 2400 units x 1, then begin infusion 1500  units/hr.  - Check heparin level 6hr after starting heparin; since a dose of apixaban was given, it is possible heparin level will have additional elevation so will also check aPTT which is unaffected by DOACs.  If these two levels correlate, can monitor therapy using heparin levels only

## 2020-12-12 NOTE — Consult Note (Signed)
NAME:  [name], MRN:  [id number], DOB:  December 14, 1970, LOS: 0 ADMISSION DATE:  12/12/2020, CONSULTATION DATE:  12/12/20 REFERRING MD:  Dr. Ralene Bathe CHIEF COMPLAINT:  DOE   History of Present Illness:  50 year old admitted with submassive PE.  1 to 2 days of severe dyspnea exertion.  Started yesterday.  Worse in the last 24 hours.  Husband came home this morning and she was severely short of breath as well as ashen appearing.  This prompted presentation to her PCP.  PCP note reviewed.  Concern for PE.  CTA PE protocol and chest x-ray obtained.  Interpreted as below.  CTA demonstrated large clot burden.  RV strain noted.  Sent to ED for evaluation.  Started on Eliquis.  Labs relatively reassuring.  Troponin elevated also consistent with submassive PE, RV dysfunction.  Shortness of breath with exertion.  Somewhat better with rest.  But still present.  No other relieving or exacerbating factors.  No environmental changes.  No position where things are better or worse.  No recent surgery.  No recent travel.  No history of DVT.  No family history of DVT.  About 1 month ago had knee injury and felt a pop.  Had a mostly Doppler at that time did not show any DVT.  Pertinent  Medical History  Asthma Budd chiari s/p surgery Elevated intracranial pressures (idiopathic) HTN  Significant Hospital Events:   . 4/11 CTA with submassive PE, admitted  Interim History / Subjective:  As above  Objective   Blood pressure 113/80, pulse (!) 104, temperature 98.3 F (36.8 C), temperature source Oral, resp. rate 16, last menstrual period 11/23/2020, SpO2 99 %.       No intake or output data in the 24 hours ending 12/12/20 1903 There were no vitals filed for this visit.  Examination: General: Well-appearing, no distress Eyes:.  EOMI, no icterus Neck: Supple, no JVP Cardiovascular: Tachycardic, no murmurs Pulmonary: Clear oxygen bilaterally, no wheezing, normal work of breathing Abdomen: Nondistended,  sounds present MSK: No synovitis, joint effusion Neuro: No weakness, sensation intact Psych: Normal mood, full affect  Labs/imaging that I havepersonally reviewed    CTA PE protocol with large clot burden, clear parenchyma on my interpretation  CXR clear on my interpretation CBC, chemistries, troponin  Resolved Hospital Problem list   n/a  Assessment & Plan:  Submassive PE: seems unprovoked. Elevated troponin, RV strain on CT. Large clot burden. Low risk via PESI score. Recommend anticoagulation alone at this point.  --Admit to stepdown --Continue anticoagulation, consider transition to short acting heparin ~5am vs continuing Eliquis (can reverse this if invasive procedure needed) --Recommend tPa if decompensates (remote budd chiari surgery) --Hold anti-hypertensive --TTE ordered --Given this is presumably unprovoked, would likely benefit from a hematology consult, this can be obtained in the outpatient setting --Will need pulmonary f/u, will arrange  We will follow along.  Best practice  Per Primary  Labs   CBC: Recent Labs  Lab 12/12/20 1535  WBC 17.2*  NEUTROABS 13.4*  HGB 16.4*  HCT 49.8*  MCV 92.6  PLT 474    Basic Metabolic Panel: Recent Labs  Lab 12/12/20 1535  NA 138  K 3.8  CL 111  CO2 18*  GLUCOSE 105*  BUN 16  CREATININE 0.90  CALCIUM 9.0   GFR: Estimated Creatinine Clearance: 92.8 mL/min (by C-G formula based on SCr of 0.9 mg/dL). Recent Labs  Lab 12/12/20 1535  WBC 17.2*    Liver Function Tests: No results for input(s):  AST, ALT, ALKPHOS, BILITOT, PROT, ALBUMIN in the last 168 hours. No results for input(s): LIPASE, AMYLASE in the last 168 hours. No results for input(s): AMMONIA in the last 168 hours.  ABG    Component Value Date/Time   TCO2 17 10/28/2015 2043     Coagulation Profile: Recent Labs  Lab 12/12/20 1600  INR 1.0    Cardiac Enzymes: No results for input(s): CKTOTAL, CKMB, CKMBINDEX, TROPONINI in the last 168  hours.  HbA1C: Hgb A1c MFr Bld  Date/Time Value Ref Range Status  06/13/2020 09:51 AM 5.0 4.8 - 5.6 % Final    Comment:             Prediabetes: 5.7 - 6.4          Diabetes: >6.4          Glycemic control for adults with diabetes: <7.0   12/21/2019 01:42 PM 5.1 4.8 - 5.6 % Final    Comment:             Prediabetes: 5.7 - 6.4          Diabetes: >6.4          Glycemic control for adults with diabetes: <7.0     CBG: No results for input(s): GLUCAP in the last 168 hours.  Review of Systems:   No cough, chest pain, orthopnea or PND. Comprehensive ROS otherwise negative  Past Medical History:  She,  has a past medical history of Asthma, Chiari I malformation ([place]), Chiari malformation (1996), High blood pressure, Hypertension, Idiopathic intracranial hypertension, Lactose intolerance, Pseudotumor cerebri, Subclavian bypass stenosis ([place]) (11/97), and Swallowing difficulty.   Surgical History:   Past Surgical History:  Procedure Laterality Date  . APPENDECTOMY    . chiari decompression    . GANGLION CYST EXCISION Right    wrist  . LAPAROSCOPIC APPENDECTOMY N/A 10/28/2015   Procedure: APPENDECTOMY LAPAROSCOPIC;  Surgeon: Armandina Gemma, MD;  Location: [facility];  Service: General;  Laterality: N/A;  . SUBCLAVIAN BYPASS GRAFT Right 07/1996  . WRIST FRACTURE SURGERY Right 02/2007     Social History:   reports that she has never smoked. She has never used smokeless tobacco. She reports that she does not drink alcohol and does not use drugs.   Family History:  Her family history includes Atrial fibrillation in her father; Cataracts in her mother; Diabetes in her father and mother; High Cholesterol in her mother; Hypertension in her father and mother; Obesity in her father and mother.   Allergies Allergies  Allergen Reactions  . Codeine Other (See Comments)    "whole body turned bright red, swelled a little" with V codiene  . Lyrica [Pregabalin] Other (See Comments)    "suicidal  thoughts"  . Nyquil Multi-Symptom [Pseudoeph-Doxylamine-Dm-Apap] Other (See Comments)    Turns skin red *all over body*, pt reports getting very hot   . Sulfa Antibiotics Other (See Comments)    "body turned red"     Home Medications  Prior to Admission medications   Medication Sig Start Date End Date Taking? Authorizing Provider  acetaZOLAMIDE (DIAMOX) 250 MG tablet TAKE 1 TABLET BY MOUTH 2 TIMES DAILY (KEEP UPCOMING APPT) 11/23/20 11/23/21  Lomax, Amy, NP  albuterol (VENTOLIN HFA) 108 (90 Base) MCG/ACT inhaler Inhale 2 puffs into the lungs every 4 (four) hours as needed for wheezing or shortness of breath. 08/04/19   [name] A, FNP  Calcium Carbonate-Vitamin D (CALCIUM + D PO) Take 1 tablet by mouth daily. Calcium 600 mg, vit  D3 300 mg    [provider]  cetirizine (ZYRTEC) 5 MG tablet Take 5 mg by mouth daily.    [provider]  cholecalciferol (VITAMIN D3) 25 MCG (1000 UT) tablet Take 2,000 Units by mouth daily.     [provider]  Cyanocobalamin (VITAMIN B 12 PO) Take 5,000 mcg by mouth daily.     [provider]  Echinacea 125 MG CAPS Take 125 mg by mouth daily.     [provider]  Liraglutide -Weight Management (SAXENDA) 18 MG/3ML SOPN Inject 0.5 mLs (3 mg total) into the skin daily. 04/25/20   Whitmire, Joneen Boers, FNP  lisinopril-hydrochlorothiazide (ZESTORETIC) 20-12.5 MG tablet TAKE 1 TABLET BY MOUTH ONCE A DAY 10/10/20 10/10/21  [name] A, FNP  Magnesium 250 MG TABS Take 250 mg by mouth daily.     [provider]  MegaRed Omega-3 Krill Oil 500 MG CAPS Take 1 capsule by mouth daily.    [provider]  metroNIDAZOLE (METROCREAM) 0.75 % cream Apply topically 2 (two) times daily. 07/28/18   Sharion Balloon, FNP  montelukast (SINGULAIR) 10 MG tablet TAKE 1 TABLET BY MOUTH AT BEDTIME 09/15/20 09/15/21  [name] A, FNP  Multiple Vitamin (MULTIVITAMIN) capsule Take 1 capsule by mouth daily.    [provider]  Oxymetazoline HCl (NASAL SPRAY NA) Place 2 sprays into the nose as needed (CONGESTION).     [provider]  potassium chloride SA (KLOR-CON) 20 MEQ tablet TAKE 1 TABLET BY MOUTH DAILY 11/10/20 11/10/21  Minus Breeding, MD  UNIFINE PENTIPS 32G X 4 MM MISC USE WITH SAXENDA TO INJECT INTO SKIN DAILY 05/03/20   Whitmire, Joneen Boers, FNP     Critical care time: n/a

## 2020-12-12 NOTE — ED Provider Notes (Signed)
[facility] DEPT Provider Note   CSN: [id number] Arrival date & time: 12/12/20  1505     History Chief Complaint  Patient presents with  . Shortness of Breath    [name] is a 50 y.o. female.  50 yo F with a cc of sob on exertion.  Going on the past couple days.  Went to PCP found to have PE with R heart strain.  Patient felt like she was exercising with very minimal exertion.  Was seen for knee pain a couple months ago and had a DVT study that time that was negative.  Otherwise denies recent trauma denies recent surgery denies history of PE or DVT denies hemoptysis denies estrogen use denies history of cancer.  The history is provided by the patient.  Shortness of Breath Severity:  Moderate Onset quality:  Gradual Duration:  2 days Timing:  Constant Progression:  Worsening Chronicity:  New Relieved by:  Nothing Worsened by:  Exertion Ineffective treatments:  None tried Associated symptoms: no chest pain, no fever, no headaches, no vomiting and no wheezing        Past Medical History:  Diagnosis Date  . Asthma   . Chiari I malformation ([place])   . Chiari malformation 1996   decompression   . High blood pressure   . Hypertension   . Idiopathic intracranial hypertension   . Lactose intolerance   . Pseudotumor cerebri   . Subclavian bypass stenosis ([place]) 11/97   right  . Swallowing difficulty     Patient Active Problem List   Diagnosis Date Noted  . Acute pain of left knee 10/27/2020  . Palpitations 07/12/2020  . Vitamin D deficiency 09/24/2018  . Insulin resistance 09/24/2018  . Class 3 severe obesity with serious comorbidity and body mass index (BMI) of 40.0 to 44.9 in adult ([place]) 09/08/2018  . Asthma 07/28/2018  . Hypokalemia 06/25/2017  . Rosacea 06/21/2017  . Obesity (BMI 30-39.9) 06/22/2016  . Allergic rhinitis 12/22/2015  . Other hyperlipidemia 12/22/2015  . Idiopathic intracranial hypertension 06/23/2015  .  Essential hypertension 03/02/2015    Past Surgical History:  Procedure Laterality Date  . APPENDECTOMY    . chiari decompression    . GANGLION CYST EXCISION Right    wrist  . LAPAROSCOPIC APPENDECTOMY N/A 10/28/2015   Procedure: APPENDECTOMY LAPAROSCOPIC;  Surgeon: Armandina Gemma, MD;  Location: [facility];  Service: General;  Laterality: N/A;  . SUBCLAVIAN BYPASS GRAFT Right 07/1996  . WRIST FRACTURE SURGERY Right 02/2007     OB History    Gravida  0   Para  0   Term  0   Preterm  0   AB  0   Living  0     SAB  0   IAB  0   Ectopic  0   Multiple  0   Live Births  0           Family History  Problem Relation Age of Onset  . Cataracts Mother   . Diabetes Mother   . Hypertension Mother   . High Cholesterol Mother   . Obesity Mother   . Diabetes Father   . Hypertension Father   . Obesity Father   . Atrial fibrillation Father     Social History   Tobacco Use  . Smoking status: Never Smoker  . Smokeless tobacco: Never Used  Vaping Use  . Vaping Use: Never used  Substance Use Topics  . Alcohol use: No  Alcohol/week: 0.0 standard drinks  . Drug use: No    Home Medications Prior to Admission medications   Medication Sig Start Date End Date Taking? Authorizing Provider  acetaZOLAMIDE (DIAMOX) 250 MG tablet TAKE 1 TABLET BY MOUTH 2 TIMES DAILY (KEEP UPCOMING APPT) 11/23/20 11/23/21  Lomax, Amy, NP  albuterol (VENTOLIN HFA) 108 (90 Base) MCG/ACT inhaler Inhale 2 puffs into the lungs every 4 (four) hours as needed for wheezing or shortness of breath. 08/04/19   [name] A, FNP  Calcium Carbonate-Vitamin D (CALCIUM + D PO) Take 1 tablet by mouth daily. Calcium 600 mg, vit D3 300 mg    [provider]  cetirizine (ZYRTEC) 5 MG tablet Take 5 mg by mouth daily.    [provider]  cholecalciferol (VITAMIN D3) 25 MCG (1000 UT) tablet Take 2,000 Units by mouth daily.     [provider]  Cyanocobalamin (VITAMIN B 12 PO) Take 5,000 mcg  by mouth daily.     [provider]  Echinacea 125 MG CAPS Take 125 mg by mouth daily.     [provider]  Liraglutide -Weight Management (SAXENDA) 18 MG/3ML SOPN Inject 0.5 mLs (3 mg total) into the skin daily. 04/25/20   Whitmire, Joneen Boers, FNP  lisinopril-hydrochlorothiazide (ZESTORETIC) 20-12.5 MG tablet TAKE 1 TABLET BY MOUTH ONCE A DAY 10/10/20 10/10/21  [name] A, FNP  Magnesium 250 MG TABS Take 250 mg by mouth daily.     [provider]  MegaRed Omega-3 Krill Oil 500 MG CAPS Take 1 capsule by mouth daily.    [provider]  metroNIDAZOLE (METROCREAM) 0.75 % cream Apply topically 2 (two) times daily. 07/28/18   Sharion Balloon, FNP  montelukast (SINGULAIR) 10 MG tablet TAKE 1 TABLET BY MOUTH AT BEDTIME 09/15/20 09/15/21  [name] A, FNP  Multiple Vitamin (MULTIVITAMIN) capsule Take 1 capsule by mouth daily.    [provider]  Oxymetazoline HCl (NASAL SPRAY NA) Place 2 sprays into the nose as needed (CONGESTION).     [provider]  potassium chloride SA (KLOR-CON) 20 MEQ tablet TAKE 1 TABLET BY MOUTH DAILY 11/10/20 11/10/21  Minus Breeding, MD  UNIFINE PENTIPS 32G X 4 MM MISC USE WITH SAXENDA TO INJECT INTO SKIN DAILY 05/03/20   Whitmire, Arrie Aran W, FNP    Allergies    Codeine, Lyrica [pregabalin], Nyquil multi-symptom [pseudoeph-doxylamine-dm-apap], and Sulfa antibiotics  Review of Systems   Review of Systems  Constitutional: Negative for chills and fever.  HENT: Negative for congestion and rhinorrhea.   Eyes: Negative for redness and visual disturbance.  Respiratory: Positive for shortness of breath. Negative for wheezing.   Cardiovascular: Negative for chest pain and palpitations.  Gastrointestinal: Negative for nausea and vomiting.  Genitourinary: Negative for dysuria and urgency.  Musculoskeletal: Negative for arthralgias and myalgias.  Skin: Negative for pallor and wound.  Neurological: Negative for dizziness and  headaches.    Physical Exam Updated Vital Signs BP (!) 136/94   Pulse (!) 109   Temp 98.3 F (36.8 C) (Oral)   Resp 20   LMP 11/23/2020 (Approximate)   SpO2 99%   Physical Exam Vitals and nursing note reviewed.  Constitutional:      General: She is not in acute distress.    Appearance: She is well-developed. She is not diaphoretic.  HENT:     Head: Normocephalic and atraumatic.  Eyes:     Pupils: Pupils are equal, round, and reactive to light.  Cardiovascular:  Rate and Rhythm: Normal rate and regular rhythm.     Heart sounds: No murmur heard. No friction rub. No gallop.   Pulmonary:     Effort: Pulmonary effort is normal.     Breath sounds: No wheezing or rales.  Abdominal:     General: There is no distension.     Palpations: Abdomen is soft.     Tenderness: There is no abdominal tenderness.  Musculoskeletal:        General: No tenderness.     Cervical back: Normal range of motion and neck supple.  Skin:    General: Skin is warm and dry.  Neurological:     Mental Status: She is alert and oriented to person, place, and time.  Psychiatric:        Behavior: Behavior normal.     ED Results / Procedures / Treatments   Labs (all labs ordered are listed, but only abnormal results are displayed) Labs Reviewed  CBC WITH DIFFERENTIAL/PLATELET  BRAIN NATRIURETIC PEPTIDE  BASIC METABOLIC PANEL  TROPONIN I (HIGH SENSITIVITY)    EKG None  Radiology DG Chest 2 View  Result Date: 12/12/2020 CLINICAL DATA:  Shortness of breath EXAM: CHEST - 2 VIEW COMPARISON:  May 04, 2015 FINDINGS: Lungs are clear. Heart size and pulmonary vascularity are normal. No adenopathy. No bone lesions. IMPRESSION: Lungs clear.  Cardiac silhouette normal. Electronically Signed   By: Lowella Grip III M.D.   On: 12/12/2020 10:39   CT Angio Chest W/Cm &/Or Wo Cm  Addendum Date: 12/12/2020   ADDENDUM REPORT: 12/12/2020 14:49 ADDENDUM: These results were called by telephone at the time  of interpretation on 12/12/2020 at 2:49 pm to provider [facility] , who verbally acknowledged these results. Electronically Signed   By: Jacqulynn Cadet M.D.   On: 12/12/2020 14:49   Result Date: 12/12/2020 CLINICAL DATA:  50 year old female with shortness of breath and tachycardia. Pulmonary embolism suspected. EXAM: CT ANGIOGRAPHY CHEST WITH CONTRAST TECHNIQUE: Multidetector CT imaging of the chest was performed using the standard protocol during bolus administration of intravenous contrast. Multiplanar CT image reconstructions and MIPs were obtained to evaluate the vascular anatomy. CONTRAST:  167mL OMNIPAQUE IOHEXOL 350 MG/ML SOLN COMPARISON:  None. FINDINGS: Cardiovascular: Bilateral pulmonary emboli including a saddle embolus. Clot burden on the right begins in the main pulmonary artery and extends into upper, middle and lower lobar pulmonary arteries. On the left, the thrombus also begins in the main pulmonary artery and extends into the upper and lower lobe pulmonary arteries. There is evidence of right heart strain within RV LV ratio of 1.81. The heart is self is normal in size. No pericardial effusion. Normal caliber main pulmonary artery. Mediastinum/Nodes: Unremarkable CT appearance of the thyroid gland. No suspicious mediastinal or hilar adenopathy. No soft tissue mediastinal mass. The thoracic esophagus is unremarkable. Lungs/Pleura: Lungs are clear. No pleural effusion or pneumothorax. Upper Abdomen: No acute abnormality. Nonobstructing left upper pole nephrolithiasis. The stone measures 0.6 cm. Musculoskeletal: No chest wall abnormality. No acute or significant osseous findings. Review of the MIP images confirms the above findings. IMPRESSION: 1. Large volume bilateral pulmonary emboli with saddle embolus. Positive for acute PE with CT evidence of right heart strain (RV/LV Ratio = 1.81) consistent with at least submassive (intermediate risk) PE. The presence of right heart strain has been  associated with an increased risk of morbidity and mortality. Please refer to the "PE Focused" order set in EPIC. 2. Nonobstructing left upper pole kidney stone. Electronically Signed: By:  Jacqulynn Cadet M.D. On: 12/12/2020 14:44    Procedures Procedures   Medications Ordered in ED Medications - No data to display  ED Course  I have reviewed the triage vital signs and the nursing notes.  Pertinent labs & imaging results that were available during my care of the patient were reviewed by me and considered in my medical decision making (see chart for details).    MDM Rules/Calculators/A&P                          49 yo F with a cc of sob on exertion.  Patient found to have a PE with right heart strain on outpatient CT scan.  Patient was then sent to the ED.  Having shortness of breath on exertion.  Okay at rest.  Mildly tachycardic with signs of right heart strain on EKG.  Will obtain blood work.  Discussed with hospitalist.  CRITICAL CARE Performed by: Cecilio Asper   Total critical care time: 35 minutes  Critical care time was exclusive of separately billable procedures and treating other patients.  Critical care was necessary to treat or prevent imminent or life-threatening deterioration.  Critical care was time spent personally by me on the following activities: development of treatment plan with patient and/or surrogate as well as nursing, discussions with consultants, evaluation of patient's response to treatment, examination of patient, obtaining history from patient or surrogate, ordering and performing treatments and interventions, ordering and review of laboratory studies, ordering and review of radiographic studies, pulse oximetry and re-evaluation of patient's condition.  The patients results and plan were reviewed and discussed.   Any x-rays performed were independently reviewed by myself.   Differential diagnosis were considered with the presenting  HPI.  Medications  heparin ADULT infusion 100 units/mL (25000 units/212mL) (1,500 Units/hr Intravenous Infusion Verify 12/13/20 1509)  Chlorhexidine Gluconate Cloth 2 % PADS 6 each (has no administration in time range)  acetaZOLAMIDE (DIAMOX) tablet 250 mg (250 mg Oral Given 12/13/20 0930)  cholecalciferol (VITAMIN D3) tablet 2,000 Units (2,000 Units Oral Given 12/13/20 0930)  potassium chloride (KLOR-CON) CR tablet 10 mEq (10 mEq Oral Given 12/13/20 0930)  albuterol (VENTOLIN HFA) 108 (90 Base) MCG/ACT inhaler 2 puff (has no administration in time range)  loratadine (CLARITIN) tablet 10 mg (10 mg Oral Given 12/13/20 0930)  montelukast (SINGULAIR) tablet 10 mg (10 mg Oral Given 12/12/20 2300)  calcium-vitamin D (OSCAL WITH D) 500-200 MG-UNIT per tablet 1 tablet (1 tablet Oral Given 12/13/20 0750)  magnesium oxide (MAG-OX) tablet 200 mg (200 mg Oral Given 12/13/20 0930)  perflutren lipid microspheres (DEFINITY) IV suspension (3 mLs Intravenous Given 12/13/20 1118)  heparin bolus via infusion 2,400 Units (2,400 Units Intravenous Bolus from Bag 12/13/20 0505)    Vitals:   12/13/20 0300 12/13/20 0400 12/13/20 0800 12/13/20 1200  BP: (!) 134/97 117/70 128/85   Pulse: 98 88 90 99  Resp: 14 11 17 16   Temp:  (!) 97.5 F (36.4 C) 97.7 F (36.5 C) 98.4 F (36.9 C)  TempSrc:  Oral Oral Oral  SpO2: 100% 100% 100% 99%  Weight:      Height:        Final diagnoses:  Acute saddle pulmonary embolism with acute cor pulmonale (HCC)    Admission/ observation were discussed with the admitting physician, patient and/or family and they are comfortable with the plan.   Final Clinical Impression(s) / ED Diagnoses Final diagnoses:  None  Rx / DC Orders ED Discharge Orders    None       Deno Etienne, [place] 12/13/20 1602

## 2020-12-12 NOTE — ED Notes (Signed)
Pt very SOB when walking to restroom. Stand by assist present.

## 2020-12-12 NOTE — Progress Notes (Signed)
Acute Office Visit  Subjective:    Patient ID: [name], female    DOB: October 06, 1970, 50 y.o.   MRN: [id number]  Chief Complaint  Patient presents with  . Shortness of Breath    HPI Patient is in today for shortness of breath. She reports dyspnea with exertion for the last 24 hours. She is feeling short of breath with short distances like walking to the bathroom. She does report chest tightness at times that lasts for a few minutes in the center of her chest. She has a spot on her upper left back near her shoulder blade that has a sharp pain at times. Her chest feels tight when she breaths. She denies pain in her left arm, neck, or jaw. Denies fever, cough, wheezing, nausea, vomiting, diaphoresis, orthopnea, edema, dizziness, palpitations, pain her her calf, or congestion. She has a history of asthma and has never had anything that has happened like this. She has used her albuterol inhaler with improvement.   Past Medical History:  Diagnosis Date  . Asthma   . Chiari I malformation ([place])   . Chiari malformation 1996   decompression   . High blood pressure   . Hypertension   . Idiopathic intracranial hypertension   . Lactose intolerance   . Pseudotumor cerebri   . Subclavian bypass stenosis ([place]) 11/97   right  . Swallowing difficulty     Past Surgical History:  Procedure Laterality Date  . APPENDECTOMY    . chiari decompression    . GANGLION CYST EXCISION Right    wrist  . LAPAROSCOPIC APPENDECTOMY N/A 10/28/2015   Procedure: APPENDECTOMY LAPAROSCOPIC;  Surgeon: Armandina Gemma, MD;  Location: [facility];  Service: General;  Laterality: N/A;  . SUBCLAVIAN BYPASS GRAFT Right 07/1996  . WRIST FRACTURE SURGERY Right 02/2007    Family History  Problem Relation Age of Onset  . Cataracts Mother   . Diabetes Mother   . Hypertension Mother   . High Cholesterol Mother   . Obesity Mother   . Diabetes Father   . Hypertension Father   . Obesity Father   . Atrial fibrillation  Father     Social History   Socioeconomic History  . Marital status: Married    Spouse name: Charlotte Crumb  . Number of children: 0  . Years of education: 90  . Highest education level: Not on file  Occupational History  . Occupation: CT Engineer, production: [employer]  Tobacco Use  . Smoking status: Never Smoker  . Smokeless tobacco: Never Used  Vaping Use  . Vaping Use: Never used  Substance and Sexual Activity  . Alcohol use: No    Alcohol/week: 0.0 standard drinks  . Drug use: No  . Sexual activity: Not on file  Other Topics Concern  . Not on file  Social History Narrative   Married, no children, lives at home   Caffeine use- 2-3 cups coffee 5 days a week   Social Determinants of Health   Financial Resource Strain: Not on file  Food Insecurity: Not on file  Transportation Needs: Not on file  Physical Activity: Not on file  Stress: Not on file  Social Connections: Not on file  Intimate Partner Violence: Not on file    Outpatient Medications Prior to Visit  Medication Sig Dispense Refill  . acetaZOLAMIDE (DIAMOX) 250 MG tablet TAKE 1 TABLET BY MOUTH 2 TIMES DAILY (KEEP UPCOMING APPT) 180 tablet 3  . albuterol (VENTOLIN HFA)  108 (90 Base) MCG/ACT inhaler Inhale 2 puffs into the lungs every 4 (four) hours as needed for wheezing or shortness of breath. 18 g 2  . Calcium Carbonate-Vitamin D (CALCIUM + D PO) Take 1 tablet by mouth daily. Calcium 600 mg, vit D3 300 mg    . cetirizine (ZYRTEC) 5 MG tablet Take 5 mg by mouth daily.    . cholecalciferol (VITAMIN D3) 25 MCG (1000 UT) tablet Take 2,000 Units by mouth daily.     . Cyanocobalamin (VITAMIN B 12 PO) Take 5,000 mcg by mouth daily.     . Echinacea 125 MG CAPS Take 125 mg by mouth daily.     . Liraglutide -Weight Management (SAXENDA) 18 MG/3ML SOPN Inject 0.5 mLs (3 mg total) into the skin daily. 15 mL 0  . lisinopril-hydrochlorothiazide (ZESTORETIC) 20-12.5 MG tablet TAKE 1 TABLET BY MOUTH ONCE A DAY 90 tablet 1   . Magnesium 250 MG TABS Take 250 mg by mouth daily.     [place] Omega-3 Krill Oil 500 MG CAPS Take 1 capsule by mouth daily.    . metroNIDAZOLE (METROCREAM) 0.75 % cream Apply topically 2 (two) times daily. 45 g 1  . montelukast (SINGULAIR) 10 MG tablet TAKE 1 TABLET BY MOUTH AT BEDTIME 90 tablet 1  . Multiple Vitamin (MULTIVITAMIN) capsule Take 1 capsule by mouth daily.    . Oxymetazoline HCl (NASAL SPRAY NA) Place 2 sprays into the nose as needed (CONGESTION).     Marland Kitchen potassium chloride SA (KLOR-CON) 20 MEQ tablet TAKE 1 TABLET BY MOUTH DAILY 90 tablet 3  . UNIFINE PENTIPS 32G X 4 MM MISC USE WITH SAXENDA TO INJECT INTO SKIN DAILY 100 each 0   No facility-administered medications prior to visit.    Allergies  Allergen Reactions  . Codeine Other (See Comments)    "whole body turned bright red, swelled a little" with V codiene  . Lyrica [Pregabalin] Other (See Comments)    "suicidal thoughts"  . Nyquil Multi-Symptom [Pseudoeph-Doxylamine-Dm-Apap] Other (See Comments)    Turns skin red *all over body*, pt reports getting very hot   . Sulfa Antibiotics Other (See Comments)    "body turned red"    Review of Systems As per HPI.     Objective:    Physical Exam Vitals and nursing note reviewed.  Constitutional:      Appearance: She is not diaphoretic.  HENT:     Head: Normocephalic and atraumatic.  Neck:     Vascular: No JVD.  Cardiovascular:     Rate and Rhythm: Regular rhythm. Tachycardia present.     Heart sounds: No murmur heard.   Pulmonary:     Effort: Tachypnea (increased work of breathing with walking) present.     Breath sounds: No stridor. No decreased breath sounds, wheezing, rhonchi or rales.  Chest:     Chest wall: No tenderness or edema.  Musculoskeletal:     Cervical back: Neck supple.     Right lower leg: No tenderness. No edema.     Left lower leg: No tenderness. No edema.     Comments: Left calf girth: 42 cm Right calf girth: 43.5 cm  Skin:     General: Skin is warm and dry.  Neurological:     General: No focal deficit present.     Mental Status: She is alert and oriented to person, place, and time.  Psychiatric:        Mood and Affect: Mood normal.  Behavior: Behavior normal.     BP 113/86   Pulse (!) 124   Temp (!) 96.3 F (35.7 C)   Ht 5\' 5"  (1.651 m)   Wt 231 lb (104.8 kg)   SpO2 100%   BMI 38.44 kg/m  Wt Readings from Last 3 Encounters:  12/12/20 231 lb (104.8 kg)  12/05/20 230 lb (104.3 kg)  11/23/20 243 lb (110.2 kg)    Health Maintenance Due  Topic Date Due  . COLONOSCOPY (Pts 45-65yrs Insurance coverage will need to be confirmed)  Never done    There are no preventive care reminders to display for this patient.   Lab Results  Component Value Date   TSH 2.810 09/15/2020   Lab Results  Component Value Date   WBC 8.2 09/15/2020   HGB 13.5 09/15/2020   HCT 40.3 09/15/2020   MCV 90 09/15/2020   PLT 237 09/15/2020   Lab Results  Component Value Date   NA 136 11/24/2020   K 3.9 11/24/2020   CO2 18 (L) 11/24/2020   GLUCOSE 80 11/24/2020   BUN 17 11/24/2020   CREATININE 0.79 11/24/2020   BILITOT <0.2 09/15/2020   ALKPHOS 43 (L) 09/15/2020   AST 11 09/15/2020   ALT 17 09/15/2020   PROT 6.6 09/15/2020   ALBUMIN 4.4 09/15/2020   CALCIUM 9.4 11/24/2020   ANIONGAP 11 10/28/2015   Lab Results  Component Value Date   CHOL 177 09/15/2020   Lab Results  Component Value Date   HDL 58 09/15/2020   Lab Results  Component Value Date   LDLCALC 105 (H) 09/15/2020   Lab Results  Component Value Date   TRIG 72 09/15/2020   Lab Results  Component Value Date   CHOLHDL 3.1 09/15/2020   Lab Results  Component Value Date   HGBA1C 5.0 06/13/2020       Assessment & Plan:   Quinnetta was seen today for shortness of breath.  Diagnoses and all orders for this visit:  Dyspnea on exertion Chest pain on breathing Dyspnea on exertion x 24 hours. Tachypnea and increased work of breathing with  walking a few feet. Tachycardia at rest. Lungs clear. EKG sinus tachycardia and indicates possible pulmonary disease on report - changed from previous EKG about 1 month ago. CXR normal today in office. Patient sent for start CT Angio to rule out PE.  -     EKG 12-Lead -     DG Chest 2 View -     CT Angio Chest W/Cm &/Or Wo Cm; Future  The patient indicates understanding of these issues and agrees with the plan.  Gwenlyn Perking, FNP

## 2020-12-12 NOTE — ED Notes (Signed)
Patient did not refuse to sign MSE. 2 RNs had to sign due to pad malfunction.

## 2020-12-12 NOTE — H&P (Signed)
History and Physical    [name]:[id number] DOB: 07/20/1971 DOA: 12/12/2020  PCP: Sharion Balloon, FNP  Patient coming from: Home, husband at bedside  I have personally briefly reviewed patient's old medical records in [place]  Chief Complaint: Increasing shortness of breath  HPI: [name] is a 50 y.o. female with medical history significant for Chiari I malformation, intracranial hypertension, right aberrant subclavian s/p  Bypass, asthma, obesity who presents with increasing shortness of breath.   Yesterday she began to feel acutely short of breath with exertion that progressively worsened.  Husband also noticed that in the morning she appeared to have cyanotic lips and had pursed lip breathing.  Also had substernal chest pain with some radiating sharp pain to her scapula.  She presented to her PCP with the symptoms and ultimately presented to the ED for further evaluation.  CTA chest showed submassive saddle embolism with extension down to bilateral lower lobe and right heart strain.  Elevated troponin of 264.  She denies any personal or family history of coagulopathy.  Not on any hormone therapy or birth control.  Patient does not use tobacco.  Denies any recent travel or prolonged immobilization.  She had some knee pain in February after a pop while she was walking but improved following steroid injections.  She was  tachycardic but remained stable on room air.  CBC with leukocytosis 17.2.  Hemoglobin of 16.4.  Unremarkable BMP.  ED physician Dr. Ralene Bathe discussed case with critical care Dr. Larey Days who recommends admission by hospitalist to stepdown and he will consult.  No intervention at this point given she is hemodynamically stable and has low PESI score.  Review of Systems: Constitutional: No Weight Change, No Fever ENT/Mouth: No sore throat, No Rhinorrhea Eyes: No Eye Pain, No Vision Changes Cardiovascular: + Chest Pain+ SOB, No PND, +  Dyspnea on Exertion, No Orthopnea, No Edema, No Palpitations Respiratory: No Cough, No Sputum, Gastrointestinal: No Nausea, No Vomiting, No Diarrhea Genitourinary: no Urinary Incontinence Musculoskeletal: No Arthralgias, No Myalgias Skin: No Skin Lesions, No Pruritus, Neuro: no Weakness, No Numbness,  Psych: No Anxiety/Panic, No Depression, no decrease appetite Heme/Lymph: No Bruising, No Bleeding  Past Medical History:  Diagnosis Date  . Asthma   . Chiari I malformation ([place])   . Chiari malformation 1996   decompression   . High blood pressure   . Hypertension   . Idiopathic intracranial hypertension   . Lactose intolerance   . Pseudotumor cerebri   . Subclavian bypass stenosis ([place]) 11/97   right  . Swallowing difficulty     Past Surgical History:  Procedure Laterality Date  . APPENDECTOMY    . chiari decompression    . GANGLION CYST EXCISION Right    wrist  . LAPAROSCOPIC APPENDECTOMY N/A 10/28/2015   Procedure: APPENDECTOMY LAPAROSCOPIC;  Surgeon: Armandina Gemma, MD;  Location: [facility];  Service: General;  Laterality: N/A;  . SUBCLAVIAN BYPASS GRAFT Right 07/1996  . WRIST FRACTURE SURGERY Right 02/2007     reports that she has never smoked. She has never used smokeless tobacco. She reports that she does not drink alcohol and does not use drugs. Social History  Allergies  Allergen Reactions  . Codeine Other (See Comments)    "whole body turned bright red, swelled a little" with V codiene  . Lyrica [Pregabalin] Other (See Comments)    "suicidal thoughts"  . Nyquil Multi-Symptom [Pseudoeph-Doxylamine-Dm-Apap] Other (See Comments)    Turns skin red *all over  body*, pt reports getting very hot   . Sulfa Antibiotics Other (See Comments)    "body turned red"    Family History  Problem Relation Age of Onset  . Cataracts Mother   . Diabetes Mother   . Hypertension Mother   . High Cholesterol Mother   . Obesity Mother   . Diabetes Father   . Hypertension Father   .  Obesity Father   . Atrial fibrillation Father      Prior to Admission medications   Medication Sig Start Date End Date Taking? Authorizing Provider  acetaZOLAMIDE (DIAMOX) 250 MG tablet TAKE 1 TABLET BY MOUTH 2 TIMES DAILY (KEEP UPCOMING APPT) 11/23/20 11/23/21  Lomax, Amy, NP  albuterol (VENTOLIN HFA) 108 (90 Base) MCG/ACT inhaler Inhale 2 puffs into the lungs every 4 (four) hours as needed for wheezing or shortness of breath. 08/04/19   [name] A, FNP  Calcium Carbonate-Vitamin D (CALCIUM + D PO) Take 1 tablet by mouth daily. Calcium 600 mg, vit D3 300 mg    [provider]  cetirizine (ZYRTEC) 5 MG tablet Take 5 mg by mouth daily.    [provider]  cholecalciferol (VITAMIN D3) 25 MCG (1000 UT) tablet Take 2,000 Units by mouth daily.     [provider]  Cyanocobalamin (VITAMIN B 12 PO) Take 5,000 mcg by mouth daily.     [provider]  Echinacea 125 MG CAPS Take 125 mg by mouth daily.     [provider]  Liraglutide -Weight Management (SAXENDA) 18 MG/3ML SOPN Inject 0.5 mLs (3 mg total) into the skin daily. 04/25/20   [place], Joneen Boers, FNP  lisinopril-hydrochlorothiazide (ZESTORETIC) 20-12.5 MG tablet TAKE 1 TABLET BY MOUTH ONCE A DAY 10/10/20 10/10/21  [name] A, FNP  Magnesium 250 MG TABS Take 250 mg by mouth daily.     [provider]  MegaRed Omega-3 Krill Oil 500 MG CAPS Take 1 capsule by mouth daily.    [provider]  metroNIDAZOLE (METROCREAM) 0.75 % cream Apply topically 2 (two) times daily. 07/28/18   Sharion Balloon, FNP  montelukast (SINGULAIR) 10 MG tablet TAKE 1 TABLET BY MOUTH AT BEDTIME 09/15/20 09/15/21  [name] A, FNP  Multiple Vitamin (MULTIVITAMIN) capsule Take 1 capsule by mouth daily.    [provider]  Oxymetazoline HCl (NASAL SPRAY NA) Place 2 sprays into the nose as needed (CONGESTION).     [provider]  potassium chloride SA (KLOR-CON) 20 MEQ tablet TAKE 1 TABLET BY  MOUTH DAILY 11/10/20 11/10/21  Minus Breeding, MD  UNIFINE PENTIPS 32G X 4 MM MISC USE WITH SAXENDA TO [facility] INTO SKIN DAILY 05/03/20   [place], [place], [place]    Physical Exam: Vitals:   12/12/20 1645 12/12/20 1800 12/12/20 1830 12/12/20 1900  BP: 111/90 (!) 118/92 113/80 127/87  Pulse: (!) 113 (!) 107 (!) 104 (!) 110  Resp: 20 17 16 17   Temp:      TempSrc:      SpO2: 97% 100% 99% 98%    Constitutional: NAD, calm, comfortable, obese female sitting upright in bed Vitals:   12/12/20 1645 12/12/20 1800 12/12/20 1830 12/12/20 1900  BP: 111/90 (!) 118/92 113/80 127/87  Pulse: (!) 113 (!) 107 (!) 104 (!) 110  Resp: 20 17 16 17   Temp:      TempSrc:      SpO2: 97% 100% 99% 98%   Eyes:  lids and conjunctivae normal ENMT: Mucous membranes are moist.  Neck: normal, supple Respiratory: clear to auscultation bilaterally, no wheezing, no crackles. Normal respiratory effort. No accessory muscle use.  Cardiovascular: Regular rate and rhythm, no murmurs / rubs / gallops. No extremity edema.  Abdomen: no tenderness, no masses palpated.  Bowel sounds positive.  Musculoskeletal: no clubbing / cyanosis. No joint deformity upper and lower extremities. Good ROM, no contractures. Normal muscle tone.  Skin: no rashes, lesions, ulcers. No induration Neurologic: CN 2-12 grossly intact. Sensation intact,  Strength 5/5 in all 4.  Psychiatric: Normal judgment and insight. Alert and oriented x 3. Normal mood.    Labs on Admission: I have personally reviewed following labs and imaging studies  CBC: Recent Labs  Lab 12/12/20 1535  WBC 17.2*  NEUTROABS 13.4*  HGB 16.4*  HCT 49.8*  MCV 92.6  PLT 916   Basic Metabolic Panel: Recent Labs  Lab 12/12/20 1535  NA 138  K 3.8  CL 111  CO2 18*  GLUCOSE 105*  BUN 16  CREATININE 0.90  CALCIUM 9.0   GFR: Estimated Creatinine Clearance: 92.8 mL/min (by C-G formula based on SCr of 0.9 mg/dL). Liver Function Tests: No results for input(s): AST, ALT,  ALKPHOS, BILITOT, PROT, ALBUMIN in the last 168 hours. No results for input(s): LIPASE, AMYLASE in the last 168 hours. No results for input(s): AMMONIA in the last 168 hours. Coagulation Profile: Recent Labs  Lab 12/12/20 1600  INR 1.0   Cardiac Enzymes: No results for input(s): CKTOTAL, CKMB, CKMBINDEX, TROPONINI in the last 168 hours. BNP (last 3 results) No results for input(s): PROBNP in the last 8760 hours. HbA1C: No results for input(s): HGBA1C in the last 72 hours. CBG: No results for input(s): GLUCAP in the last 168 hours. Lipid Profile: No results for input(s): CHOL, HDL, LDLCALC, TRIG, CHOLHDL, LDLDIRECT in the last 72 hours. Thyroid Function Tests: No results for input(s): TSH, T4TOTAL, FREET4, T3FREE, THYROIDAB in the last 72 hours. Anemia Panel: No results for input(s): VITAMINB12, FOLATE, FERRITIN, TIBC, IRON, RETICCTPCT in the last 72 hours. Urine analysis:    Component Value Date/Time   COLORURINE YELLOW 10/28/2015 1913   APPEARANCEUR Clear 12/19/2016 1052   LABSPEC 1.024 10/28/2015 1913   PHURINE 5.0 10/28/2015 1913   GLUCOSEU Negative 12/19/2016 [place] 10/28/2015 1913   BILIRUBINUR Negative 12/19/2016 1052   KETONESUR 40 (A) 10/28/2015 1913   PROTEINUR Negative 12/19/2016 [place] 10/28/2015 1913   UROBILINOGEN 0.2 04/02/2010 1647   NITRITE Negative 12/19/2016 1052   NITRITE NEGATIVE 10/28/2015 1913   LEUKOCYTESUR Negative 12/19/2016 1052    Radiological Exams on Admission: DG Chest 2 View  Result Date: 12/12/2020 CLINICAL DATA:  Shortness of breath EXAM: CHEST - 2 VIEW COMPARISON:  May 04, 2015 FINDINGS: Lungs are clear. Heart size and pulmonary vascularity are normal. No adenopathy. No bone lesions. IMPRESSION: Lungs clear.  Cardiac silhouette normal. Electronically Signed   By: Lowella Grip III M.D.   On: 12/12/2020 10:39   CT Angio Chest W/Cm &/Or Wo Cm  Addendum Date: 12/12/2020   ADDENDUM REPORT:  12/12/2020 14:49 ADDENDUM: These results were called by telephone at the time of interpretation on 12/12/2020 at 2:49 pm to provider [facility] , who verbally acknowledged these results. Electronically Signed   By: Jacqulynn Cadet M.D.   On: 12/12/2020 14:49   Result Date: 12/12/2020 CLINICAL DATA:  50 year old female with shortness of breath and tachycardia. Pulmonary embolism suspected. EXAM: CT ANGIOGRAPHY CHEST WITH CONTRAST TECHNIQUE: Multidetector CT imaging of  the chest was performed using the standard protocol during bolus administration of intravenous contrast. Multiplanar CT image reconstructions and MIPs were obtained to evaluate the vascular anatomy. CONTRAST:  120mL OMNIPAQUE IOHEXOL 350 MG/ML SOLN COMPARISON:  None. FINDINGS: Cardiovascular: Bilateral pulmonary emboli including a saddle embolus. Clot burden on the right begins in the main pulmonary artery and extends into upper, middle and lower lobar pulmonary arteries. On the left, the thrombus also begins in the main pulmonary artery and extends into the upper and lower lobe pulmonary arteries. There is evidence of right heart strain within RV LV ratio of 1.81. The heart is self is normal in size. No pericardial effusion. Normal caliber main pulmonary artery. Mediastinum/Nodes: Unremarkable CT appearance of the thyroid gland. No suspicious mediastinal or hilar adenopathy. No soft tissue mediastinal mass. The thoracic esophagus is unremarkable. Lungs/Pleura: Lungs are clear. No pleural effusion or pneumothorax. Upper Abdomen: No acute abnormality. Nonobstructing left upper pole nephrolithiasis. The stone measures 0.6 cm. Musculoskeletal: No chest wall abnormality. No acute or significant osseous findings. Review of the MIP images confirms the above findings. IMPRESSION: 1. Large volume bilateral pulmonary emboli with saddle embolus. Positive for acute PE with CT evidence of right heart strain (RV/LV Ratio = 1.81) consistent with at least  submassive (intermediate risk) PE. The presence of right heart strain has been associated with an increased risk of morbidity and mortality. Please refer to the "PE Focused" order set in EPIC. 2. Nonobstructing left upper pole kidney stone. Electronically Signed: By: Jacqulynn Cadet M.D. On: 12/12/2020 14:44      Assessment/Plan  Submassive PE with right heart strain  -First unprovoked  -obtain Echo  -Critical has seen in consultation and will follow. No invasive interventions at this time due to hemodynamic stability and low PESI score  -She has received a dose of Eliquis tonight at 1700. I have discussed with pharmacy and will switch to IV heparin in the morning in case invasive intervention is needed  -coagulation workup usually not completed for first unprovoked since it usually is not a predicator of recurrence  - conside outpt Hem/Onc consult and need pulmonary f/u  Intracranial hypertension Continue diamox   HTN hold lisinopril-HCTZ for now to avoid hypotension with current right heart strain  Obesity Can continue Saxenda outpatient  DVT prophylaxis:.Eliquis and will switch to IV heparin the morning Code Status: Full Family Communication: Plan discussed with patient and husband at bedside  disposition Plan: Home with at least 2 midnight stays  Consults called:  Admission status: inpatient Level of care: Stepdown  Status is: Inpatient  Remains inpatient appropriate because:Inpatient level of care appropriate due to severity of illness   Dispo: The patient is from: Home              Anticipated d/c is to: Home              Patient currently is not medically stable to d/c.   Difficult to place patient No         Orene Desanctis DO [facility]   If 7PM-7AM, please contact night-coverage [email or web address]   12/12/2020, 7:26 PM

## 2020-12-12 NOTE — Progress Notes (Signed)
eLink Physician-Brief Progress Note Patient Name: [name] DOB: 02/06/1971 MRN: [id number]   Date of Service  12/12/2020  HPI/Events of Note  Patient admitted with sub-massive PE, currently hemodynamically stable.  eICU Interventions  New Patient Evaluation completed.        Kerry Kass Khristie Sak 12/12/2020, 10:32 PM

## 2020-12-12 NOTE — ED Provider Notes (Signed)
Patient care assumed at 1600. Patient with some massive PE diagnosis on outpatient CT scan earlier today. Labs are pending.  CBC with leukocytosis, hemoconcentration. Troponin is elevated at 264. She has already received eliquis. Hospitalist consulted for admission for ongoing treatment- hospitalist recommend PCCM consult.  D/w with intensivist - he recommends hospitalist admission to PCU/SDU, he will see the patient in consult.  Hospitalist consulted for admission.   Quintella Reichert, MD 12/13/20 [phone]

## 2020-12-12 NOTE — ED Triage Notes (Signed)
Patient presents from MD office with reports of shortness of breath for 1.5 days. The patient was seen by MD today and an EKG, Xray and CT were performed.  MD sent patient here with concerns of a blood clot.

## 2020-12-13 ENCOUNTER — Inpatient Hospital Stay (HOSPITAL_COMMUNITY): Payer: 59

## 2020-12-13 DIAGNOSIS — I2602 Saddle embolus of pulmonary artery with acute cor pulmonale: Secondary | ICD-10-CM | POA: Diagnosis not present

## 2020-12-13 DIAGNOSIS — R7989 Other specified abnormal findings of blood chemistry: Secondary | ICD-10-CM

## 2020-12-13 DIAGNOSIS — R0789 Other chest pain: Secondary | ICD-10-CM | POA: Diagnosis not present

## 2020-12-13 DIAGNOSIS — R0602 Shortness of breath: Secondary | ICD-10-CM | POA: Diagnosis not present

## 2020-12-13 DIAGNOSIS — I2699 Other pulmonary embolism without acute cor pulmonale: Secondary | ICD-10-CM

## 2020-12-13 LAB — ECHOCARDIOGRAM COMPLETE
Area-P 1/2: 1.94 cm2
Height: 65 in
S' Lateral: 2.1 cm
Weight: 3647.29 oz

## 2020-12-13 LAB — CBC
HCT: 47.9 % — ABNORMAL HIGH (ref 36.0–46.0)
HCT: 45.9 % (ref 36.0–46.0)
Hemoglobin: 15.5 g/dL — ABNORMAL HIGH (ref 12.0–15.0)
Hemoglobin: 15.4 g/dL — ABNORMAL HIGH (ref 12.0–15.0)
MCH: 30.3 pg (ref 26.0–34.0)
MCH: 31.2 pg (ref 26.0–34.0)
MCHC: 32.4 g/dL (ref 30.0–36.0)
MCHC: 33.6 g/dL (ref 30.0–36.0)
MCV: 93.7 fL (ref 80.0–100.0)
MCV: 93.1 fL (ref 80.0–100.0)
Platelets: 206 10*3/uL (ref 150–400)
Platelets: 185 10*3/uL (ref 150–400)
RBC: 5.11 MIL/uL (ref 3.87–5.11)
RBC: 4.93 MIL/uL (ref 3.87–5.11)
RDW: 12.7 % (ref 11.5–15.5)
RDW: 12.7 % (ref 11.5–15.5)
WBC: 12.3 10*3/uL — ABNORMAL HIGH (ref 4.0–10.5)
WBC: 12 10*3/uL — ABNORMAL HIGH (ref 4.0–10.5)
nRBC: 0 % (ref 0.0–0.2)
nRBC: 0 % (ref 0.0–0.2)

## 2020-12-13 LAB — BASIC METABOLIC PANEL
Anion gap: 11 (ref 5–15)
BUN: 15 mg/dL (ref 6–20)
CO2: 15 mmol/L — ABNORMAL LOW (ref 22–32)
Calcium: 8.6 mg/dL — ABNORMAL LOW (ref 8.9–10.3)
Chloride: 109 mmol/L (ref 98–111)
Creatinine, Ser: 0.74 mg/dL (ref 0.44–1.00)
GFR, Estimated: >60 mL/min (ref >60)
Glucose, Bld: 95 mg/dL (ref 70–99)
Potassium: 3.6 mmol/L (ref 3.5–5.1)
Sodium: 135 mmol/L (ref 135–145)

## 2020-12-13 LAB — APTT
aPTT: 73 seconds — ABNORMAL HIGH (ref 24–36)
aPTT: 88 seconds — ABNORMAL HIGH (ref 24–36)

## 2020-12-13 LAB — HEPARIN LEVEL (UNFRACTIONATED): Heparin Unfractionated: >1.1 IU/mL — ABNORMAL HIGH (ref 0.30–0.70)

## 2020-12-13 LAB — MRSA PCR SCREENING: MRSA by PCR: NEGATIVE

## 2020-12-13 LAB — SARS CORONAVIRUS 2 (TAT 6-24 HRS): SARS Coronavirus 2: NEGATIVE

## 2020-12-13 MED ORDER — LISINOPRIL 10 MG PO TABS
10.0000 mg | ORAL_TABLET | Freq: Every day | ORAL | Status: DC
  Administered 2020-12-13 – 2020-12-14 (×2): 10 mg via ORAL
  Filled 2020-12-13 (×2): qty 1

## 2020-12-13 MED ORDER — PERFLUTREN LIPID MICROSPHERE
1.0000 mL | INTRAVENOUS | Status: AC | PRN
  Administered 2020-12-13: 3 mL via INTRAVENOUS
  Filled 2020-12-13: qty 10

## 2020-12-13 NOTE — Consult Note (Signed)
NAME:  [name], MRN:  [id number], DOB:  1970-11-29, LOS: 1 ADMISSION DATE:  12/12/2020, CONSULTATION DATE:  12/13/20 REFERRING MD:  Dr. Ralene Bathe CHIEF COMPLAINT:  DOE   History of Present Illness:  50 year old admitted with submassive PE.  1 to 2 days of severe dyspnea exertion.  Started yesterday.  Worse in the last 24 hours.  Husband came home this morning and she was severely short of breath as well as ashen appearing.  This prompted presentation to her PCP.  PCP note reviewed.  Concern for PE.  CTA PE protocol and chest x-ray obtained.  Interpreted as below.  CTA demonstrated large clot burden.  RV strain noted.  Sent to ED for evaluation.  Started on Eliquis.  Labs relatively reassuring.  Troponin elevated also consistent with submassive PE, RV dysfunction.  Shortness of breath with exertion.  Somewhat better with rest.  But still present.  No other relieving or exacerbating factors.  No environmental changes.  No position where things are better or worse.  No recent surgery.  No recent travel.  No history of DVT.  No family history of DVT.  About 1 month ago had knee injury and felt a pop.  Had a mostly Doppler at that time did not show any DVT.  Pertinent  Medical History  Asthma Budd chiari s/p surgery Elevated intracranial pressures (idiopathic) HTN  Significant Hospital Events:   . 4/11 CTA with submassive PE, admitted  Interim History / Subjective:  HR, DOE improving. Volume overload with flattening septum on parasternal short but reported normal RV function and pressures.   Objective   Blood pressure 128/85, pulse 99, temperature 98.1 F (36.7 C), temperature source Oral, resp. rate 16, height 5\' 5"  (1.651 m), weight 103.4 kg, last menstrual period 11/23/2020, SpO2 99 %.        Intake/Output Summary (Last 24 hours) at 12/13/2020 1751 Last data filed at 12/13/2020 1509 Gross per 24 hour  Intake 173.27 ml  Output --  Net 173.27 ml   Filed Weights   12/12/20 2126   Weight: 103.4 kg    Examination: General: Well-appearing, no distress Eyes:.  EOMI, no icterus Neck: Supple, no JVP Cardiovascular: Tachycardic, no murmurs Pulmonary: Clear oxygen bilaterally, no wheezing, normal work of breathing Abdomen: Nondistended, sounds present MSK: No synovitis, joint effusion Neuro: No weakness, sensation intact Psych: Normal mood, full affect  Labs/imaging that I havepersonally reviewed    CTA PE protocol with large clot burden, clear parenchyma on my interpretation  CXR clear on my interpretation CBC, chemistries, troponin  Resolved Hospital Problem list   n/a  Assessment & Plan:  Submassive PE: seems unprovoked. Elevated troponin, RV strain on CT. Large clot burden. Low risk via PESI score. Recommend anticoagulation alone at this point. TTE with mild RV changes. --Continue anticoagulation, transition DOAC tomorrow --Hold anti-hypertensive --Given this is presumably unprovoked, would likely benefit from a hematology consult, this can be obtained in the outpatient setting --Will need pulmonary f/u, will arrange  We will follow along.  Best practice  Per Primary  Labs   CBC: Recent Labs  Lab 12/12/20 1535 12/13/20 0251 12/13/20 0618  WBC 17.2* 12.3* 12.0*  NEUTROABS 13.4*  --   --   HGB 16.4* 15.5* 15.4*  HCT 49.8* 47.9* 45.9  MCV 92.6 93.7 93.1  PLT 224 206 536    Basic Metabolic Panel: Recent Labs  Lab 12/12/20 1535 12/13/20 0251  NA 138 135  K 3.8 3.6  CL 111 109  CO2 18* 15*  GLUCOSE 105* 95  BUN 16 15  CREATININE 0.90 0.74  CALCIUM 9.0 8.6*   GFR: Estimated Creatinine Clearance: 103.8 mL/min (by C-G formula based on SCr of 0.74 mg/dL). Recent Labs  Lab 12/12/20 1535 12/13/20 0251 12/13/20 0618  WBC 17.2* 12.3* 12.0*    Liver Function Tests: No results for input(s): AST, ALT, ALKPHOS, BILITOT, PROT, ALBUMIN in the last 168 hours. No results for input(s): LIPASE, AMYLASE in the last 168 hours. No results for  input(s): AMMONIA in the last 168 hours.  ABG    Component Value Date/Time   TCO2 17 10/28/2015 2043     Coagulation Profile: Recent Labs  Lab 12/12/20 1600  INR 1.0    Cardiac Enzymes: No results for input(s): CKTOTAL, CKMB, CKMBINDEX, TROPONINI in the last 168 hours.  HbA1C: Hgb A1c MFr Bld  Date/Time Value Ref Range Status  06/13/2020 09:51 AM 5.0 4.8 - 5.6 % Final    Comment:             Prediabetes: 5.7 - 6.4          Diabetes: >6.4          Glycemic control for adults with diabetes: <7.0   12/21/2019 01:42 PM 5.1 4.8 - 5.6 % Final    Comment:             Prediabetes: 5.7 - 6.4          Diabetes: >6.4          Glycemic control for adults with diabetes: <7.0     CBG: No results for input(s): GLUCAP in the last 168 hours.  Review of Systems:   No cough, chest pain, orthopnea or PND. Comprehensive ROS otherwise negative  Past Medical History:  She,  has a past medical history of Asthma, Chiari I malformation ([place]), Chiari malformation (1996), High blood pressure, Hypertension, Idiopathic intracranial hypertension, Lactose intolerance, Pseudotumor cerebri, Subclavian bypass stenosis ([place]) (11/97), and Swallowing difficulty.   Surgical History:   Past Surgical History:  Procedure Laterality Date  . APPENDECTOMY    . chiari decompression    . GANGLION CYST EXCISION Right    wrist  . LAPAROSCOPIC APPENDECTOMY N/A 10/28/2015   Procedure: APPENDECTOMY LAPAROSCOPIC;  Surgeon: Armandina Gemma, MD;  Location: [facility];  Service: General;  Laterality: N/A;  . SUBCLAVIAN BYPASS GRAFT Right 07/1996  . WRIST FRACTURE SURGERY Right 02/2007     Social History:   reports that she has never smoked. She has never used smokeless tobacco. She reports that she does not drink alcohol and does not use drugs.   Family History:  Her family history includes Atrial fibrillation in her father; Cataracts in her mother; Diabetes in her father and mother; High Cholesterol in her mother;  Hypertension in her father and mother; Obesity in her father and mother.   Allergies Allergies  Allergen Reactions  . Codeine Other (See Comments)    "whole body turned bright red, swelled a little" with V codiene  . Lyrica [Pregabalin] Other (See Comments)    "suicidal thoughts"  . Nyquil Multi-Symptom [Pseudoeph-Doxylamine-Dm-Apap] Other (See Comments)    Turns skin red *all over body*, pt reports getting very hot   . Sulfa Antibiotics Other (See Comments)    "body turned red"     Home Medications  Prior to Admission medications   Medication Sig Start Date End Date Taking? Authorizing Provider  acetaZOLAMIDE (DIAMOX) 250 MG tablet TAKE 1 TABLET BY MOUTH 2 TIMES DAILY (  KEEP UPCOMING APPT) 11/23/20 11/23/21  Debbora Presto, NP  albuterol (VENTOLIN HFA) 108 (90 Base) MCG/ACT inhaler Inhale 2 puffs into the lungs every 4 (four) hours as needed for wheezing or shortness of breath. 08/04/19   [name] A, FNP  Calcium Carbonate-Vitamin D (CALCIUM + D PO) Take 1 tablet by mouth daily. Calcium 600 mg, vit D3 300 mg    [provider]  cetirizine (ZYRTEC) 5 MG tablet Take 5 mg by mouth daily.    [provider]  cholecalciferol (VITAMIN D3) 25 MCG (1000 UT) tablet Take 2,000 Units by mouth daily.     [provider]  Cyanocobalamin (VITAMIN B 12 PO) Take 5,000 mcg by mouth daily.     [provider]  Echinacea 125 MG CAPS Take 125 mg by mouth daily.     [provider]  Liraglutide -Weight Management (SAXENDA) 18 MG/3ML SOPN Inject 0.5 mLs (3 mg total) into the skin daily. 04/25/20   Whitmire, Joneen Boers, FNP  lisinopril-hydrochlorothiazide (ZESTORETIC) 20-12.5 MG tablet TAKE 1 TABLET BY MOUTH ONCE A DAY 10/10/20 10/10/21  [name] A, FNP  Magnesium 250 MG TABS Take 250 mg by mouth daily.     [provider]  MegaRed Omega-3 Krill Oil 500 MG CAPS Take 1 capsule by mouth daily.    [provider]  metroNIDAZOLE (METROCREAM) 0.75 % cream  Apply topically 2 (two) times daily. 07/28/18   Sharion Balloon, FNP  montelukast (SINGULAIR) 10 MG tablet TAKE 1 TABLET BY MOUTH AT BEDTIME 09/15/20 09/15/21  [name] A, FNP  Multiple Vitamin (MULTIVITAMIN) capsule Take 1 capsule by mouth daily.    [provider]  Oxymetazoline HCl (NASAL SPRAY NA) Place 2 sprays into the nose as needed (CONGESTION).     [provider]  potassium chloride SA (KLOR-CON) 20 MEQ tablet TAKE 1 TABLET BY MOUTH DAILY 11/10/20 11/10/21  Minus Breeding, MD  UNIFINE PENTIPS 32G X 4 MM MISC USE WITH SAXENDA TO INJECT INTO SKIN DAILY 05/03/20   Whitmire, Joneen Boers, FNP     Critical care time: n/a

## 2020-12-13 NOTE — Progress Notes (Addendum)
PROGRESS NOTE  [name]  DOB: 10-23-1970  PCP: Sharion Balloon, FNP XKG:[id number]  DOA: 12/12/2020  LOS: 1 day   Chief Complaint  Patient presents with  . Shortness of Breath   Brief narrative: KYANI Mills is a 50 y.o. female with PMH significant for hypertension, pseudotumor cerebri, Chiari malformation, right aberrant subclavian artery stenosis s/p bypass, lactose intolerance. Patient presented to the ED on 4/11, sent by PCP with complaint of progressive dyspnea for 1 to 2 days associated with substernal chest pain.  In the ED, patient was tachycardic to 115, breathing on room air Labs with WC count elevated to 17.2 CT angio chest showed large volume bilateral pulmonary emboli with saddle embolus. Positive for acute PE with CT evidence of right heart strain (RV/LV ratio = 1.81) consistent with at least submassive (intermediate risk) PE. Critical care consultation was obtained.  Because of patient's hemodynamically stability, emergency thrombolysis was not considered.  Patient was started on heparin drip and admitted to hospitalist service.  Subjective: Patient was seen and examined this morning.  Pleasant middle-aged Caucasian female.  Sitting up in chair.  Not in distress.  Not on supplemental oxygen.  Feels better than yesterday. Chart reviewed Tachycardia improved this morning. WBC count improving  Assessment/Plan: Submassive PE with right heart strain  -Presented with progressive worsening shortness of breath, chest pain -CT angio chest as above with large volume bilateral pulmonary medicine with saddle embolus but with hemodynamic stability.  No need of thrombolysis per critical care. -Patient is currently on heparin drip.  Continue the same for another 24 to 48 hours. -Pending echocardiogram.  Pending DVT scan of lower extremities -Outpatient hematology referral.  Leukocytosis -No acute infection at this time.  WBC count improving Recent Labs  Lab  12/12/20 1535 12/13/20 0251 12/13/20 0618  WBC 17.2* 12.3* 12.0*   Intracranial hypertension -Continue diamox   HTN -hold lisinopril-HCTZ for now to avoid hypotension with current right heart strain  Obesity -Can continue Saxenda (Liraglutide) weight management as an outpatient  Mobility: Encourage ambulation Code Status:   Code Status: Full Code  Nutritional status: Body mass index is 37.93 kg/m.     Diet Order            Diet Heart Room service appropriate? Yes; Fluid consistency: Thin  Diet effective now                 DVT prophylaxis: Heparin drip   Antimicrobials:  None Fluid: None Consultants: Critical care Family Communication:  None at bedside  Status is: Inpatient  Remains inpatient appropriate because: Needs IV heparin drip   Dispo: The patient is from: Home              Anticipated d/c is to: Home              Patient currently is not medically stable to d/c.   Difficult to place patient No       Infusions:  . heparin 1,500 Units/hr (12/13/20 0510)    Scheduled Meds: . acetaZOLAMIDE  250 mg Oral BID  . calcium-vitamin D  1 tablet Oral Q breakfast  . Chlorhexidine Gluconate Cloth  6 each Topical Daily  . cholecalciferol  2,000 Units Oral Daily  . loratadine  10 mg Oral Daily  . magnesium oxide  200 mg Oral Daily  . montelukast  10 mg Oral QHS  . potassium chloride SA  10 mEq Oral BID    Antimicrobials: Anti-infectives (From admission, onward)  None      PRN meds: albuterol   Objective: Vitals:   12/13/20 0400 12/13/20 0800  BP: 117/70 128/85  Pulse: 88 90  Resp: 11 17  Temp: (!) 97.5 F (36.4 C) 97.7 F (36.5 C)  SpO2: 100% 100%   No intake or output data in the 24 hours ending 12/13/20 1013 Filed Weights   12/12/20 2126  Weight: 103.4 kg   Weight change:  Body mass index is 37.93 kg/m.   Physical Exam: General exam: Pleasant, middle-aged Caucasian female.  Not in distress Skin: No rashes, lesions or  ulcers. HEENT: Atraumatic, normocephalic, no obvious bleeding Lungs: Clear to auscultation bilaterally CVS: Regular rate and rhythm, no murmur GI/Abd soft, nontender, nondistended, bowel sound present CNS: Alert, awake, oriented x3 Psychiatry: Mood appropriate Extremities: No pedal edema, no calf tenderness  Data Review: I have personally reviewed the laboratory data and studies available.  Recent Labs  Lab 12/12/20 1535 12/13/20 0251 12/13/20 0618  WBC 17.2* 12.3* 12.0*  NEUTROABS 13.4*  --   --   HGB 16.4* 15.5* 15.4*  HCT 49.8* 47.9* 45.9  MCV 92.6 93.7 93.1  PLT 224 206 185   Recent Labs  Lab 12/12/20 1535 12/13/20 0251  NA 138 135  K 3.8 3.6  CL 111 109  CO2 18* 15*  GLUCOSE 105* 95  BUN 16 15  CREATININE 0.90 0.74  CALCIUM 9.0 8.6*    F/u labs ordered Unresulted Labs (From admission, onward)          Start     Ordered   12/13/20 1100  Heparin level (unfractionated)  Once-Timed,   TIMED        12/12/20 1858   12/13/20 1100  APTT  ONCE - STAT,   STAT        12/12/20 1858   12/13/20 0500  CBC  Daily,   R     Question:  Specimen collection method  Answer:  Lab=Lab collect   12/13/20 0313          Signed, Terrilee Croak, MD [facility] 12/13/2020

## 2020-12-13 NOTE — Progress Notes (Signed)
[place] for IV heparin Indication: pulmonary embolus  Allergies  Allergen Reactions  . Codeine Other (See Comments)    "whole body turned bright red, swelled a little" with V codiene  . Lyrica [Pregabalin] Other (See Comments)    "suicidal thoughts"  . Nyquil Multi-Symptom [Pseudoeph-Doxylamine-Dm-Apap] Other (See Comments)    Turns skin red *all over body*, pt reports getting very hot   . Sulfa Antibiotics Other (See Comments)    "body turned red"    Patient Measurements: Height: 5\' 5"  (165.1 cm) Weight: 103.4 kg (227 lb 15.3 oz) IBW/kg (Calculated) : 57 Heparin Dosing Weight: 81.3 kg  Vital Signs: Temp: 98.1 F (36.7 C) (04/12 1600) Temp Source: Oral (04/12 1600) BP: 178/101 (04/12 1700) Pulse Rate: 93 (04/12 1700)  Labs: Recent Labs    12/12/20 1535 12/12/20 1600 12/12/20 1736 12/13/20 0251 12/13/20 0618 12/13/20 1215 12/13/20 1814  HGB 16.4*  --   --  15.5* 15.4*  --   --   HCT 49.8*  --   --  47.9* 45.9  --   --   PLT 224  --   --  206 185  --   --   APTT  --  33  --   --   --  73* 88*  LABPROT  --  13.2  --   --   --   --   --   INR  --  1.0  --   --   --   --   --   HEPARINUNFRC  --   --   --   --   --  >1.10*  --   CREATININE 0.90  --   --  0.74  --   --   --   TROPONINIHS 264*  --  202*  --   --   --   --     Estimated Creatinine Clearance: 103.8 mL/min (by C-G formula based on SCr of 0.74 mg/dL).  Assessment: 50 yo female presents with progressive dyspnea and chest pain x1-2 days. CTA shows large bilateral PE with saddle embolus and RH strain. Pharmacy initially consulted for Eliquis, then changed to IV heparin after Eliquis 10 mg given x 1 dose.   Baseline INR, aPTT: WNL  Prior anticoagulation: none  Significant events:  Today, 12/13/2020:  AM CBC: remains WNL  confirmatory aPTT therapeutic @ 88 seconds on 1500 units/hr  no bleeding or infusion issues per nursing  R Pop DVT noted on LE  doppler  Goal of Therapy: Heparin level 0.3-0.7 units/ml APTT 66-102 sec Monitor platelets by anticoagulation protocol: Yes  Plan:  Continue heparin IV infusion at 1500 units/hr  Daily CBC and heparin level; aPTT as needed while DOAC effects present  Monitor for signs of bleeding or thrombosis  Per CCM, likely transition back to Eliquis in 24 hr if remains stable  Eudelia Bunch, Pharm.D 12/13/2020 7:48 PM

## 2020-12-13 NOTE — Progress Notes (Signed)
  Echocardiogram 2D Echocardiogram has been performed.  [name] 12/13/2020, 11:26 AM

## 2020-12-13 NOTE — TOC Initial Note (Signed)
Transition of Care [facility]) - Initial/Assessment Note    Patient Details  Name: [name] MRN: [id number] Date of Birth: [birth year]  Transition of Care [facility]) CM/SW Contact:    Leeroy Cha, RN Phone Number: 12/13/2020, 8:17 AM  Clinical Narrative:                 Ct scan of lungs:FINDINGS: Cardiovascular: Bilateral pulmonary emboli including a saddle embolus. Clot burden on the right begins in the main pulmonary artery and extends into upper, middle and lower lobar pulmonary arteries. On the left, the thrombus also begins in the main pulmonary artery and extends into the upper and lower lobe pulmonary arteries. There is evidence of right heart strain within RV LV ratio of 1.81. The heart is self is normal in size. No pericardial effusion. Normal caliber main pulmonary artery.  Mediastinum/Nodes: Unremarkable CT appearance of the thyroid gland. No suspicious mediastinal or hilar adenopathy. No soft tissue mediastinal mass. The thoracic esophagus is unremarkable.  Lungs/Pleura: Lungs are clear. No pleural effusion or pneumothorax.  Upper Abdomen: No acute abnormality. Nonobstructing left upper pole nephrolithiasis. The stone measures 0.6 cm.  Musculoskeletal: No chest wall abnormality. No acute or significant osseous findings.  Review of the MIP images confirms the above findings.  IMPRESSION: 1. Large volume bilateral pulmonary emboli with saddle embolus. Positive for acute PE with CT evidence of right heart strain (RV/LV Ratio = 1.81) consistent with at least submassive (intermediate risk) PE. The presence of right heart strain has been associated with an increased risk of morbidity and mortality. Please refer to the "PE Focused" order set in EPIC. 2. Nonobstructing left upper pole kidney stone. iov heparin PLAN: following for toc needs and progression. Expected Discharge Plan: Home/Self Care Barriers to Discharge: Continued Medical Work up   Patient  Goals and CMS Choice Patient states their goals for this hospitalization and ongoing recovery are:: to go home CMS Medicare.gov Compare Post Acute Care list provided to:: Patient    Expected Discharge Plan and Services Expected Discharge Plan: Home/Self Care   Discharge Planning Services: CM Consult   Living arrangements for the past 2 months: Single Family Home                                      Prior Living Arrangements/Services Living arrangements for the past 2 months: Single Family Home Lives with:: Spouse Patient language and need for interpreter reviewed:: Yes Do you feel safe going back to the place where you live?: Yes      Need for Family Participation in Patient Care: Yes (Comment) Care giver support system in place?: Yes (comment)   Criminal Activity/Legal Involvement Pertinent to Current Situation/Hospitalization: No - Comment as needed  Activities of Daily Living Home Assistive Devices/Equipment: Eyeglasses ADL Screening (condition at time of admission) Patient's cognitive ability adequate to safely complete daily activities?: Yes Is the patient deaf or have difficulty hearing?: No Does the patient have difficulty seeing, even when wearing glasses/contacts?: No Does the patient have difficulty concentrating, remembering, or making decisions?: No Patient able to express need for assistance with ADLs?: Yes Does the patient have difficulty dressing or bathing?: No Independently performs ADLs?: Yes (appropriate for developmental age) Does the patient have difficulty walking or climbing stairs?: Yes (gets short of breath) Weakness of Legs: Both Weakness of Arms/Hands: Both  Permission Sought/Granted  Emotional Assessment Appearance:: Appears stated age Attitude/Demeanor/Rapport: Engaged Affect (typically observed): Calm Orientation: : Oriented to  Time,Oriented to Place,Oriented to Self,Oriented to Situation Alcohol / Substance  Use: Not Applicable Psych Involvement: No (comment)  Admission diagnosis:  Pulmonary embolism (HCC) [I26.99] Acute saddle pulmonary embolism with acute cor pulmonale ([place]) [I26.02] Patient Active Problem List   Diagnosis Date Noted  . Pulmonary embolism ([place]) 12/12/2020  . Acute pain of left knee 10/27/2020  . Palpitations 07/12/2020  . Vitamin D deficiency 09/24/2018  . Insulin resistance 09/24/2018  . Class 3 severe obesity with serious comorbidity and body mass index (BMI) of 40.0 to 44.9 in adult ([place]) 09/08/2018  . Asthma 07/28/2018  . Hypokalemia 06/25/2017  . Rosacea 06/21/2017  . Obesity (BMI 30-39.9) 06/22/2016  . Allergic rhinitis 12/22/2015  . Other hyperlipidemia 12/22/2015  . Idiopathic intracranial hypertension 06/23/2015  . Essential hypertension 03/02/2015   PCP:  Sharion Balloon, FNP Pharmacy:   [facility] [place] [address] Phone: [phone] Fax: [phone]  [id number] - [place], [place] [address] Phone: [phone] Fax: [phone]     Social Determinants of Health (SDOH) Interventions    Readmission Risk Interventions No flowsheet data found.

## 2020-12-13 NOTE — Progress Notes (Addendum)
[facility] for IV heparin Indication: pulmonary embolus  Allergies  Allergen Reactions  . Codeine Other (See Comments)    "whole body turned bright red, swelled a little" with V codiene  . Lyrica [Pregabalin] Other (See Comments)    "suicidal thoughts"  . Nyquil Multi-Symptom [Pseudoeph-Doxylamine-Dm-Apap] Other (See Comments)    Turns skin red *all over body*, pt reports getting very hot   . Sulfa Antibiotics Other (See Comments)    "body turned red"    Patient Measurements: Height: 5\' 5"  (165.1 cm) Weight: 103.4 kg (227 lb 15.3 oz) IBW/kg (Calculated) : 57 Heparin Dosing Weight: 81.3 kg  Vital Signs: Temp: 98.4 F (36.9 C) (04/12 1200) Temp Source: Oral (04/12 1200) BP: 128/85 (04/12 0800) Pulse Rate: 99 (04/12 1200)  Labs: Recent Labs    12/12/20 1535 12/12/20 1600 12/12/20 1736 12/13/20 0251 12/13/20 0618 12/13/20 1215  HGB 16.4*  --   --  15.5* 15.4*  --   HCT 49.8*  --   --  47.9* 45.9  --   PLT 224  --   --  206 185  --   APTT  --  33  --   --   --  73*  LABPROT  --  13.2  --   --   --   --   INR  --  1.0  --   --   --   --   HEPARINUNFRC  --   --   --   --   --  >1.10*  CREATININE 0.90  --   --  0.74  --   --   TROPONINIHS 264*  --  202*  --   --   --     Estimated Creatinine Clearance: 103.8 mL/min (by C-G formula based on SCr of 0.74 mg/dL).  Medications:  . heparin 1,500 Units/hr (12/13/20 0510)   . acetaZOLAMIDE  250 mg Oral BID  . calcium-vitamin D  1 tablet Oral Q breakfast  . Chlorhexidine Gluconate Cloth  6 each Topical Daily  . cholecalciferol  2,000 Units Oral Daily  . loratadine  10 mg Oral Daily  . magnesium oxide  200 mg Oral Daily  . montelukast  10 mg Oral QHS  . potassium chloride SA  10 mEq Oral BID   Medications Prior to Admission  Medication Sig Dispense Refill  . acetaZOLAMIDE (DIAMOX) 250 MG tablet TAKE 1 TABLET BY MOUTH 2 TIMES DAILY (KEEP UPCOMING APPT) 180 tablet 3  . albuterol  (VENTOLIN HFA) 108 (90 Base) MCG/ACT inhaler Inhale 2 puffs into the lungs every 4 (four) hours as needed for wheezing or shortness of breath. 18 g 2  . Calcium Carbonate-Vitamin D (CALCIUM + D PO) Take 1 tablet by mouth daily. Calcium 600 mg, vit D3 300 mg    . cetirizine (ZYRTEC) 5 MG tablet Take 5 mg by mouth daily.    . cholecalciferol (VITAMIN D3) 25 MCG (1000 UT) tablet Take 2,000 Units by mouth daily.     . Cyanocobalamin (VITAMIN B 12 PO) Take 5,000 mcg by mouth daily.     . Echinacea 125 MG CAPS Take 125 mg by mouth daily.     . Liraglutide -Weight Management (SAXENDA) 18 MG/3ML SOPN Inject 0.5 mLs (3 mg total) into the skin daily. 15 mL 0  . lisinopril-hydrochlorothiazide (ZESTORETIC) 20-12.5 MG tablet TAKE 1 TABLET BY MOUTH ONCE A DAY 90 tablet 1  . Magnesium 250 MG TABS Take 250 mg by mouth  daily.     . MegaRed Omega-3 Krill Oil 500 MG CAPS Take 1 capsule by mouth daily.    . montelukast (SINGULAIR) 10 MG tablet TAKE 1 TABLET BY MOUTH AT BEDTIME 90 tablet 1  . Multiple Vitamin (MULTIVITAMIN) capsule Take 1 capsule by mouth daily.    . Oxymetazoline HCl (NASAL SPRAY NA) Place 2 sprays into the nose as needed (CONGESTION).     Marland Kitchen potassium chloride SA (KLOR-CON) 20 MEQ tablet TAKE 1 TABLET BY MOUTH DAILY (Patient taking differently: Take 10 mEq by mouth 2 (two) times daily.) 90 tablet 3  . TART CHERRY PO Take 1 capsule by mouth daily.    . TURMERIC PO Take 1 capsule by mouth daily.    . metroNIDAZOLE (METROCREAM) 0.75 % cream Apply topically 2 (two) times daily. (Patient not taking: Reported on 12/12/2020) 45 g 1  . UNIFINE PENTIPS 32G X 4 MM MISC USE WITH SAXENDA TO INJECT INTO SKIN DAILY 100 each 0     Assessment: 50 yo female presents with progressive dyspnea and chest pain x1-2 days. CTA shows large bilateral PE with saddle embolus and RH strain. Pharmacy initially consulted for Eliquis, then changed to IV heparin after Eliquis 10 mg given x 1 dose.   Baseline INR, aPTT:  WNL  Prior anticoagulation: none  Significant events:  Today, 12/13/2020:  CBC: remains WNL  Most recent aPTT therapeutic on 1500 units/hr; heparin level elevated d/t recent DOAC  No bleeding or infusion issues per nursing  R Pop DVT noted on LE doppler  Goal of Therapy: Heparin level 0.3-0.7 units/ml APTT 66-102 sec Monitor platelets by anticoagulation protocol: Yes  Plan:  Continue heparin IV infusion at 1500 units/hr  Recheck confirmatory aPTT in 6 hrs  Daily CBC and heparin level; aPTT as needed while DOAC effects present  Monitor for signs of bleeding or thrombosis  Per CCM, likely transition back to Eliquis in 24 hr if remains stable  Reuel Boom, PharmD, BCPS [phone] 12/13/2020, 1:06 PM

## 2020-12-13 NOTE — Progress Notes (Signed)
Bilateral lower extremity venous duplex has been completed. Preliminary results can be found in CV Proc through chart review.  Results were given to the patient's nurse, [facility].  12/13/20 12:02 PM [name] RVT

## 2020-12-14 ENCOUNTER — Other Ambulatory Visit (HOSPITAL_COMMUNITY): Payer: Self-pay

## 2020-12-14 DIAGNOSIS — Z029 Encounter for administrative examinations, unspecified: Secondary | ICD-10-CM

## 2020-12-14 DIAGNOSIS — I2602 Saddle embolus of pulmonary artery with acute cor pulmonale: Secondary | ICD-10-CM | POA: Diagnosis not present

## 2020-12-14 LAB — CBC
HCT: 42.4 % (ref 36.0–46.0)
Hemoglobin: 14 g/dL (ref 12.0–15.0)
MCH: 30.6 pg (ref 26.0–34.0)
MCHC: 33 g/dL (ref 30.0–36.0)
MCV: 92.8 fL (ref 80.0–100.0)
Platelets: 181 10*3/uL (ref 150–400)
RBC: 4.57 MIL/uL (ref 3.87–5.11)
RDW: 12.6 % (ref 11.5–15.5)
WBC: 8.2 10*3/uL (ref 4.0–10.5)
nRBC: 0.4 % — ABNORMAL HIGH (ref 0.0–0.2)

## 2020-12-14 LAB — HEPARIN LEVEL (UNFRACTIONATED): Heparin Unfractionated: 0.66 IU/mL (ref 0.30–0.70)

## 2020-12-14 LAB — APTT: aPTT: 88 seconds — ABNORMAL HIGH (ref 24–36)

## 2020-12-14 MED ORDER — ELIQUIS 5 MG PO TABS
ORAL_TABLET | ORAL | 0 refills | Status: DC
  Filled 2020-12-14: qty 72, 30d supply, fill #0

## 2020-12-14 NOTE — Progress Notes (Signed)
NAME:  [name], MRN:  [id number], DOB:  03/03/1971, LOS: 2 ADMISSION DATE:  12/12/2020, CONSULTATION DATE:  12/14/20 REFERRING MD:  Dr. Ralene Bathe CHIEF COMPLAINT:  DOE   History of Present Illness:  50 year old admitted with submassive PE.  1 to 2 days of severe dyspnea exertion.  Started yesterday.  Worse in the last 24 hours.  Husband came home this morning and she was severely short of breath as well as ashen appearing.  This prompted presentation to her PCP.  PCP note reviewed.  Concern for PE.  CTA PE protocol and chest x-ray obtained.  Interpreted as below.  CTA demonstrated large clot burden.  RV strain noted.  Sent to ED for evaluation.  Started on Eliquis.  Labs relatively reassuring.  Troponin elevated also consistent with submassive PE, RV dysfunction.  Shortness of breath with exertion.  Somewhat better with rest.  But still present.  No other relieving or exacerbating factors.  No environmental changes.  No position where things are better or worse.  No recent surgery.  No recent travel.  No history of DVT.  No family history of DVT.  About 1 month ago had knee injury and felt a pop.  Had a mostly Doppler at that time did not show any DVT.  Pertinent  Medical History  Asthma Budd chiari s/p surgery Elevated intracranial pressures (idiopathic) HTN  Significant Hospital Events:   . 4/11 CTA with submassive PE, admitted  Interim History / Subjective:  HR improved, still dyspneic.  Objective   Blood pressure 118/68, pulse 96, temperature (!) 97.3 F (36.3 C), temperature source Oral, resp. rate (!) 21, height 5\' 5"  (1.651 m), weight 103.4 kg, last menstrual period 11/23/2020, SpO2 97 %.        Intake/Output Summary (Last 24 hours) at 12/14/2020 0930 Last data filed at 12/13/2020 1730 Gross per 24 hour  Intake 413.27 ml  Output --  Net 413.27 ml   Filed Weights   12/12/20 2126  Weight: 103.4 kg    Examination: General: Well-appearing, no distress Eyes:.  EOMI,  no icterus Neck: Supple, no JVP Cardiovascular: RRR, no murmurs Pulmonary: CTAB , no wheezing, normal work of breathing Psych: Normal mood, full affect  Labs/imaging that I havepersonally reviewed    CTA PE protocol with large clot burden, clear parenchyma on my interpretation  CXR clear on my interpretation CBC, chemistries, troponin  Resolved Hospital Problem list   n/a  Assessment & Plan:  Submassive PE: seems unprovoked. Elevated troponin, RV strain on CT. Large clot burden. Low risk via PESI score. Recommend anticoagulation alone at this point. TTE with mild RV changes. --Continue anticoagulation, transition DOAC as TRH deems appropriate --Hold anti-hypertensive --Given this is presumably unprovoked, would likely benefit from a hematology consult, this can be obtained in the outpatient setting --Will need pulmonary f/u, will arrange with TTE in 3 months --Needs to see PCP in next 2 weeks for BP check, make sure can obtain anticoagulation  We will sign off.  Best practice  Per Primary  Labs   CBC: Recent Labs  Lab 12/12/20 1535 12/13/20 0251 12/13/20 0618 12/14/20 0308  WBC 17.2* 12.3* 12.0* 8.2  NEUTROABS 13.4*  --   --   --   HGB 16.4* 15.5* 15.4* 14.0  HCT 49.8* 47.9* 45.9 42.4  MCV 92.6 93.7 93.1 92.8  PLT 224 206 185 412    Basic Metabolic Panel: Recent Labs  Lab 12/12/20 1535 12/13/20 0251  NA 138 135  K 3.8  3.6  CL 111 109  CO2 18* 15*  GLUCOSE 105* 95  BUN 16 15  CREATININE 0.90 0.74  CALCIUM 9.0 8.6*   GFR: Estimated Creatinine Clearance: 103.8 mL/min (by C-G formula based on SCr of 0.74 mg/dL). Recent Labs  Lab 12/12/20 1535 12/13/20 0251 12/13/20 0618 12/14/20 0308  WBC 17.2* 12.3* 12.0* 8.2    Liver Function Tests: No results for input(s): AST, ALT, ALKPHOS, BILITOT, PROT, ALBUMIN in the last 168 hours. No results for input(s): LIPASE, AMYLASE in the last 168 hours. No results for input(s): AMMONIA in the last 168 hours.  ABG     Component Value Date/Time   TCO2 17 10/28/2015 2043     Coagulation Profile: Recent Labs  Lab 12/12/20 1600  INR 1.0    Cardiac Enzymes: No results for input(s): CKTOTAL, CKMB, CKMBINDEX, TROPONINI in the last 168 hours.  HbA1C: Hgb A1c MFr Bld  Date/Time Value Ref Range Status  06/13/2020 09:51 AM 5.0 4.8 - 5.6 % Final    Comment:             Prediabetes: 5.7 - 6.4          Diabetes: >6.4          Glycemic control for adults with diabetes: <7.0   12/21/2019 01:42 PM 5.1 4.8 - 5.6 % Final    Comment:             Prediabetes: 5.7 - 6.4          Diabetes: >6.4          Glycemic control for adults with diabetes: <7.0     CBG: No results for input(s): GLUCAP in the last 168 hours.  Review of Systems:   No cough, chest pain, orthopnea or PND. Comprehensive ROS otherwise negative  Past Medical History:  She,  has a past medical history of Asthma, Chiari I malformation ([place]), Chiari malformation (1996), High blood pressure, Hypertension, Idiopathic intracranial hypertension, Lactose intolerance, Pseudotumor cerebri, Subclavian bypass stenosis ([place]) (11/97), and Swallowing difficulty.   Surgical History:   Past Surgical History:  Procedure Laterality Date  . APPENDECTOMY    . chiari decompression    . GANGLION CYST EXCISION Right    wrist  . LAPAROSCOPIC APPENDECTOMY N/A 10/28/2015   Procedure: APPENDECTOMY LAPAROSCOPIC;  Surgeon: Armandina Gemma, MD;  Location: [facility];  Service: General;  Laterality: N/A;  . SUBCLAVIAN BYPASS GRAFT Right 07/1996  . WRIST FRACTURE SURGERY Right 02/2007     Social History:   reports that she has never smoked. She has never used smokeless tobacco. She reports that she does not drink alcohol and does not use drugs.   Family History:  Her family history includes Atrial fibrillation in her father; Cataracts in her mother; Diabetes in her father and mother; High Cholesterol in her mother; Hypertension in her father and mother; Obesity in her  father and mother.   Allergies Allergies  Allergen Reactions  . Codeine Other (See Comments)    "whole body turned bright red, swelled a little" with V codiene  . Lyrica [Pregabalin] Other (See Comments)    "suicidal thoughts"  . Nyquil Multi-Symptom [Pseudoeph-Doxylamine-Dm-Apap] Other (See Comments)    Turns skin red *all over body*, pt reports getting very hot   . Sulfa Antibiotics Other (See Comments)    "body turned red"     Home Medications  Prior to Admission medications   Medication Sig Start Date End Date Taking? Authorizing Provider  acetaZOLAMIDE (DIAMOX) 250 MG  tablet TAKE 1 TABLET BY MOUTH 2 TIMES DAILY (KEEP UPCOMING APPT) 11/23/20 11/23/21  Lomax, Amy, NP  albuterol (VENTOLIN HFA) 108 (90 Base) MCG/ACT inhaler Inhale 2 puffs into the lungs every 4 (four) hours as needed for wheezing or shortness of breath. 08/04/19   [name] A, FNP  Calcium Carbonate-Vitamin D (CALCIUM + D PO) Take 1 tablet by mouth daily. Calcium 600 mg, vit D3 300 mg    [provider]  cetirizine (ZYRTEC) 5 MG tablet Take 5 mg by mouth daily.    [provider]  cholecalciferol (VITAMIN D3) 25 MCG (1000 UT) tablet Take 2,000 Units by mouth daily.     [provider]  Cyanocobalamin (VITAMIN B 12 PO) Take 5,000 mcg by mouth daily.     [provider]  Echinacea 125 MG CAPS Take 125 mg by mouth daily.     [provider]  Liraglutide -Weight Management (SAXENDA) 18 MG/3ML SOPN Inject 0.5 mLs (3 mg total) into the skin daily. 04/25/20   [place]mire, Joneen Boers, FNP  lisinopril-hydrochlorothiazide (ZESTORETIC) 20-12.5 MG tablet TAKE 1 TABLET BY MOUTH ONCE A DAY 10/10/20 10/10/21  [name] A, FNP  Magnesium 250 MG TABS Take 250 mg by mouth daily.     [provider]  MegaRed Omega-3 Krill Oil 500 MG CAPS Take 1 capsule by mouth daily.    [provider]  metroNIDAZOLE (METROCREAM) 0.75 % cream Apply topically 2 (two) times daily. 07/28/18   Sharion Balloon, FNP  montelukast (SINGULAIR) 10 MG tablet TAKE 1 TABLET BY MOUTH AT BEDTIME 09/15/20 09/15/21  [name] A, FNP  Multiple Vitamin (MULTIVITAMIN) capsule Take 1 capsule by mouth daily.    [provider]  Oxymetazoline HCl (NASAL SPRAY NA) Place 2 sprays into the nose as needed (CONGESTION).     [provider]  potassium chloride SA (KLOR-CON) 20 MEQ tablet TAKE 1 TABLET BY MOUTH DAILY 11/10/20 11/10/21  Minus Breeding, MD  UNIFINE PENTIPS 32G X 4 MM MISC USE WITH SAXENDA TO INJECT INTO SKIN DAILY 05/03/20   [place], Joneen Boers, FNP     Critical care time: n/a

## 2020-12-14 NOTE — Discharge Summary (Addendum)
Physician Discharge Summary  [name] JTT:[id number] DOB: [birth year] DOA: 12/12/2020  PCP: Sharion Balloon, FNP  Admit date: 12/12/2020 Discharge date: 12/14/2020  Admitted From: Home Discharge disposition: Home   Code Status: Full Code  Diet Recommendation: Cardiac diet  Discharge Diagnosis:   Principal Problem:   Pulmonary embolism ([place]) Active Problems:   Essential hypertension   Idiopathic intracranial hypertension   Obesity (BMI 30-39.9)  Chief Complaint  Patient presents with  . Shortness of Breath   Brief narrative: [name] is a 50 y.o. female with PMH significant for hypertension, pseudotumor cerebri, Chiari malformation, right aberrant subclavian artery stenosis s/p bypass, lactose intolerance. Patient presented to the ED on 4/11, sent by PCP with complaint of progressive dyspnea for 1 to 2 days associated with substernal chest pain.  In the ED, patient was tachycardic to 115, breathing on room air Labs with WC count elevated to 17.2 CT angio chest showed large volume bilateral pulmonary emboli with saddle embolus. Positive for acute PE with CT evidence of right heart strain (RV/LV ratio = 1.81) consistent with at least submassive (intermediate risk) PE. Critical care consultation was obtained.  Because of patient's hemodynamically stability, emergency thrombolysis was not considered.  Patient was started on heparin drip and admitted to hospitalist service. See below for details  Subjective: Patient was seen and examined this morning.   Sitting up in chair.  Not in distress.  Not on supplemental oxygen at rest.  Tolerating heparin drip.  Assessment/Plan: Submassive PE with right heart strain  Right leg DVT -Presented with progressively worsening shortness of breath, chest pain -CT angio chest as above with large volume bilateral pulmonary medicine with saddle embolus but with hemodynamic stability.  No need of thrombolysis per critical  care. -Ultrasound DVT scan of lower extremities showed right popliteal vein DVT. -Patient was treated with heparin drip for 48 hours.  Echocardiogram showed EF of 65 to 70% and findings of RV overload related to pulmonary embolism. -Plan to discharge home today on Eliquis per patient choice.  10 mg twice daily for for 7 days followed by 5 mg twice daily.  Since this is her first episode of DVT/PE, I would suggest anticoagulation for duration of 3 to 6 months.  But she will have follow-up with hematology as an outpatient and further determination for length of treatment by hematology. -Follow-up with pulmonary as an outpatient for repeat echocardiogram. -Given outpatient hematology referral. -Patient is not requiring supplemental oxygen at rest.  Even on ambulation today, she was able to indent oxygen saturation more than 90% on room air without symptoms..  Leukocytosis -No acute infection at this time.  WBC count improving Recent Labs  Lab 12/12/20 1535 12/13/20 0251 12/13/20 0618 12/14/20 0308  WBC 17.2* 12.3* 12.0* 8.2   Intracranial hypertension -Continue diamox   HTN -Resume lisinopril/HCTZ at discharge  Obesity -Can continue Saxenda (Liraglutide) weight management as an outpatient   Wound care: Incision - 3 Ports Abdomen 1: Umbilicus 2: Left;Lower 3: Right;Upper (Active)  Placement Date/Time: 10/29/15 0012   Location of Ports: Abdomen  Port: 1:  Location Orientation: Umbilicus  Port: 2:  Location Orientation: Left;Lower  Port: 3:  Location Orientation: Right;Upper    Assessments 10/29/2015 12:12 AM 10/29/2015  8:00 AM  Port 1 Site Assessment Clean;Dry Clean;Dry  Port 1 Margins -- Attached edges (approximated)  Port 1 Drainage Amount -- None  Port 1 Dressing Type Adhesive strips;Benzoine;Gauze (Comment);Tape dressing Adhesive strips;Benzoine;Gauze (Comment);Tape dressing  Port 1 Dressing Status --  Clean;Dry;Intact  Port 2 Site Assessment Clean;Dry Clean;Dry  Port 2 Margins  -- Attached edges (approximated)  Port 2 Drainage Amount -- None  Port 2 Dressing Type Adhesive strips;Benzoine;Gauze (Comment);Tape dressing Adhesive strips;Benzoine;Gauze (Comment);Tape dressing  Port 2 Dressing Status -- Clean;Dry;Intact  Port 3 Site Assessment Clean;Dry Clean;Dry  Port 3 Margins -- Attached edges (approximated)  Port 3 Drainage Amount -- None  Port 3 Dressing Type Adhesive strips;Gauze (Comment);Benzoine;Tape dressing Adhesive strips;Gauze (Comment);Benzoine;Tape dressing  Port 3 Dressing Status -- Clean;Dry;Intact     No Linked orders to display    Discharge Exam:   Vitals:   12/14/20 0700 12/14/20 0800 12/14/20 0920 12/14/20 1000  BP: 100/60 111/71 118/68 111/73  Pulse: 85 96  84  Resp: 18 (!) 21  17  Temp:  (!) 97.3 F (36.3 C)    TempSrc:  Oral    SpO2: 98% 97%  97%  Weight:      Height:        Body mass index is 37.93 kg/m.  General exam: Pleasant, middle-aged Caucasian female.  Not in distress Skin: No rashes, lesions or ulcers. HEENT: Atraumatic, normocephalic, no obvious bleeding Lungs: Clear to auscultation bilaterally CVS: Regular rate and rhythm, no murmur GI/Abd soft, nontender, nondistended, bowel sound present CNS: Alert, awake, oriented x3 Psychiatry: Mood appropriate Extremities: No pedal edema, no calf tenderness  Follow ups:   Discharge Instructions    Ambulatory referral to Hematology / Oncology   Complete by: As directed    Right leg DVT and bilateral pulmonary rhythm.  May need hypercoagulability studies   Diet - low sodium heart healthy   Complete by: As directed    Increase activity slowly   Complete by: As directed       Follow-up Information    Sharion Balloon, FNP Follow up.   Specialty: Family Medicine Contact information: [place] [id number] [phone]               Recommendations for Outpatient Follow-Up:   1. Follow-up with PCP as an outpatient 2. Follow-up with pulmonary  as an outpatient 3. Follow-up with hematology as an outpatient  Discharge Instructions:  Follow with Primary MD Sharion Balloon, FNP in 7 days   Get CBC/BMP checked in next visit within 1 week by PCP or SNF MD ( we routinely change or add medications that can affect your baseline labs and fluid status, therefore we recommend that you get the mentioned basic workup next visit with your PCP, your PCP may decide not to get them or add new tests based on their clinical decision)  On your next visit with your PCP, please Get Medicines reviewed and adjusted.  Please request your PCP  to go over all Hospital Tests and Procedure/Radiological results at the follow up, please get all Hospital records sent to your Prim MD by signing hospital release before you go home.  Activity: As tolerated with Full fall precautions use walker/cane & assistance as needed  For Heart failure patients - Check your Weight same time everyday, if you gain over 2 pounds, or you develop in leg swelling, experience more shortness of breath or chest pain, call your Primary MD immediately. Follow Cardiac Low Salt Diet and 1.5 lit/day fluid restriction.  If you have smoked or chewed Tobacco in the last 2 yrs please stop smoking, stop any regular Alcohol  and or any Recreational drug use.  If you experience worsening of your admission symptoms, develop shortness of breath,  life threatening emergency, suicidal or homicidal thoughts you must seek medical attention immediately by calling 911 or calling your MD immediately  if symptoms less severe.  You Must read complete instructions/literature along with all the possible adverse reactions/side effects for all the Medicines you take and that have been prescribed to you. Take any new Medicines after you have completely understood and accpet all the possible adverse reactions/side effects.   Do not drive, operate heavy machinery, perform activities at heights, swimming or participation  in water activities or provide baby sitting services if your were admitted for syncope or siezures until you have seen by Primary MD or a Neurologist and advised to do so again.  Do not drive when taking Pain medications.  Do not take more than prescribed Pain, Sleep and Anxiety Medications  Wear Seat belts while driving.   Please note You were cared for by a hospitalist during your hospital stay. If you have any questions about your discharge medications or the care you received while you were in the hospital after you are discharged, you can call the unit and asked to speak with the hospitalist on call if the hospitalist that took care of you is not available. Once you are discharged, your primary care physician will handle any further medical issues. Please note that NO REFILLS for any discharge medications will be authorized once you are discharged, as it is imperative that you return to your primary care physician (or establish a relationship with a primary care physician if you do not have one) for your aftercare needs so that they can reassess your need for medications and monitor your lab values.    Allergies as of 12/14/2020      Reactions   Codeine Other (See Comments)   "whole body turned bright red, swelled a little" with V codiene   Lyrica [pregabalin] Other (See Comments)   "suicidal thoughts"   Nyquil Multi-symptom [pseudoeph-doxylamine-dm-apap] Other (See Comments)   Turns skin red *all over body*, pt reports getting very hot    Sulfa Antibiotics Other (See Comments)   "body turned red"      Medication List    TAKE these medications   acetaZOLAMIDE 250 MG tablet Commonly known as: DIAMOX TAKE 1 TABLET BY MOUTH 2 TIMES DAILY (KEEP UPCOMING APPT)   albuterol 108 (90 Base) MCG/ACT inhaler Commonly known as: VENTOLIN HFA Inhale 2 puffs into the lungs every 4 (four) hours as needed for wheezing or shortness of breath.   CALCIUM + D PO Take 1 tablet by mouth daily.  Calcium 600 mg, vit D3 300 mg   cetirizine 5 MG tablet Commonly known as: ZYRTEC Take 5 mg by mouth daily.   cholecalciferol 25 MCG (1000 UNIT) tablet Commonly known as: VITAMIN D3 Take 2,000 Units by mouth daily.   Echinacea 125 MG Caps Take 125 mg by mouth daily.   Eliquis 5 MG Tabs tablet Generic drug: apixaban Take 2 tablets (10mg ) twice daily for 7 days, then 1 tablet (5mg ) twice daily   lisinopril-hydrochlorothiazide 20-12.5 MG tablet Commonly known as: ZESTORETIC TAKE 1 TABLET BY MOUTH ONCE A DAY   Magnesium 250 MG Tabs Take 250 mg by mouth daily.   MegaRed Omega-3 Krill Oil 500 MG Caps Take 1 capsule by mouth daily.   metroNIDAZOLE 0.75 % cream Commonly known as: METROCREAM Apply topically 2 (two) times daily.   montelukast 10 MG tablet Commonly known as: SINGULAIR TAKE 1 TABLET BY MOUTH AT BEDTIME   multivitamin capsule Take  1 capsule by mouth daily.   NASAL SPRAY NA Place 2 sprays into the nose as needed (CONGESTION).   potassium chloride SA 20 MEQ tablet Commonly known as: KLOR-CON TAKE 1 TABLET BY MOUTH DAILY What changed:   how much to take  when to take this   Saxenda 18 MG/3ML Sopn Generic drug: Liraglutide -Weight Management Inject 0.5 mLs (3 mg total) into the skin daily.   TART CHERRY PO Take 1 capsule by mouth daily.   TURMERIC PO Take 1 capsule by mouth daily.   Unifine Pentips 32G X 4 MM Misc Generic drug: Insulin Pen Needle USE WITH SAXENDA TO INJECT INTO SKIN DAILY   VITAMIN B 12 PO Take 5,000 mcg by mouth daily.       Time coordinating discharge: 35 minutes  The results of significant diagnostics from this hospitalization (including imaging, microbiology, ancillary and laboratory) are listed below for reference.    Procedures and Diagnostic Studies:   DG Chest 2 View  Result Date: 12/12/2020 CLINICAL DATA:  Shortness of breath EXAM: CHEST - 2 VIEW COMPARISON:  May 04, 2015 FINDINGS: Lungs are clear. Heart size  and pulmonary vascularity are normal. No adenopathy. No bone lesions. IMPRESSION: Lungs clear.  Cardiac silhouette normal. Electronically Signed   By: Lowella Grip III M.D.   On: 12/12/2020 10:39   CT Angio Chest W/Cm &/Or Wo Cm  Addendum Date: 12/12/2020   ADDENDUM REPORT: 12/12/2020 14:49 ADDENDUM: These results were called by telephone at the time of interpretation on 12/12/2020 at 2:49 pm to provider [facility] , who verbally acknowledged these results. Electronically Signed   By: Jacqulynn Cadet M.D.   On: 12/12/2020 14:49   Result Date: 12/12/2020 CLINICAL DATA:  50 year old female with shortness of breath and tachycardia. Pulmonary embolism suspected. EXAM: CT ANGIOGRAPHY CHEST WITH CONTRAST TECHNIQUE: Multidetector CT imaging of the chest was performed using the standard protocol during bolus administration of intravenous contrast. Multiplanar CT image reconstructions and MIPs were obtained to evaluate the vascular anatomy. CONTRAST:  151mL OMNIPAQUE IOHEXOL 350 MG/ML SOLN COMPARISON:  None. FINDINGS: Cardiovascular: Bilateral pulmonary emboli including a saddle embolus. Clot burden on the right begins in the main pulmonary artery and extends into upper, middle and lower lobar pulmonary arteries. On the left, the thrombus also begins in the main pulmonary artery and extends into the upper and lower lobe pulmonary arteries. There is evidence of right heart strain within RV LV ratio of 1.81. The heart is self is normal in size. No pericardial effusion. Normal caliber main pulmonary artery. Mediastinum/Nodes: Unremarkable CT appearance of the thyroid gland. No suspicious mediastinal or hilar adenopathy. No soft tissue mediastinal mass. The thoracic esophagus is unremarkable. Lungs/Pleura: Lungs are clear. No pleural effusion or pneumothorax. Upper Abdomen: No acute abnormality. Nonobstructing left upper pole nephrolithiasis. The stone measures 0.6 cm. Musculoskeletal: No chest wall abnormality.  No acute or significant osseous findings. Review of the MIP images confirms the above findings. IMPRESSION: 1. Large volume bilateral pulmonary emboli with saddle embolus. Positive for acute PE with CT evidence of right heart strain (RV/LV Ratio = 1.81) consistent with at least submassive (intermediate risk) PE. The presence of right heart strain has been associated with an increased risk of morbidity and mortality. Please refer to the "PE Focused" order set in EPIC. 2. Nonobstructing left upper pole kidney stone. Electronically Signed: By: Jacqulynn Cadet M.D. On: 12/12/2020 14:44   ECHOCARDIOGRAM COMPLETE  Result Date: 12/13/2020    ECHOCARDIOGRAM REPORT   Patient  Name:   [name] Date of Exam: 12/13/2020 Medical Rec #:  [id number]        Height:       65.0 in Accession #:    [id number]       Weight:       228.0 lb Date of Birth:  1970/11/04        BSA:          2.091 m Patient Age:    5 years         BP:           145/90 mmHg Patient Gender: F                HR:           101 bpm. Exam Location:  Inpatient Procedure: 2D Echo, Cardiac Doppler, Color Doppler and Intracardiac            Opacification Agent Indications:    Elevated Troponin.  History:        Patient has no prior history of Echocardiogram examinations.                 Risk Factors:Hypertension. Chiari malformation.  Sonographer:    Tiffany Dance Referring Phys: [id number] [place]  1. Left ventricular ejection fraction, by estimation, is 65 to 70%. The left ventricle has normal function. The left ventricle has no regional wall motion abnormalities. Left ventricular diastolic parameters were normal. There is the interventricular septum is flattened in diastole ('D' shaped left ventricle), consistent with right ventricular volume overload.  2. Right ventricular systolic function is normal. The right ventricular size is moderately enlarged. There is normal pulmonary artery systolic pressure.  3. The mitral valve is normal in  structure. Trivial mitral valve regurgitation.  4. The aortic valve is normal in structure. Aortic valve regurgitation is not visualized. No aortic stenosis is present. FINDINGS  Left Ventricle: Left ventricular ejection fraction, by estimation, is 65 to 70%. The left ventricle has normal function. The left ventricle has no regional wall motion abnormalities. Definity contrast agent was given IV to delineate the left ventricular  endocardial borders. The left ventricular internal cavity size was small. There is no left ventricular hypertrophy. The interventricular septum is flattened in diastole ('D' shaped left ventricle), consistent with right ventricular volume overload. Left  ventricular diastolic parameters were normal. Right Ventricle: The right ventricular size is moderately enlarged. Right vetricular wall thickness was not well visualized. Right ventricular systolic function is normal. There is normal pulmonary artery systolic pressure. The tricuspid regurgitant velocity is 2.43 m/s, and with an assumed right atrial pressure of 3 mmHg, the estimated right ventricular systolic pressure is 24.4 mmHg. Left Atrium: Left atrial size was normal in size. Right Atrium: Right atrial size was normal in size. Pericardium: There is no evidence of pericardial effusion. Mitral Valve: The mitral valve is normal in structure. Trivial mitral valve regurgitation. Tricuspid Valve: The tricuspid valve is grossly normal. Tricuspid valve regurgitation is mild. Aortic Valve: The aortic valve is normal in structure. Aortic valve regurgitation is not visualized. No aortic stenosis is present. Pulmonic Valve: The pulmonic valve was normal in structure. Pulmonic valve regurgitation is trivial. Aorta: The aortic root and ascending aorta are structurally normal, with no evidence of dilitation. IAS/Shunts: The atrial septum is grossly normal.  LEFT VENTRICLE PLAX 2D LVIDd:         3.30 cm  Diastology LVIDs:         2.10 cm  LV e' medial:     9.03 cm/s LV PW:         1.00 cm  LV E/e' medial:  5.4 LV IVS:        1.00 cm  LV e' lateral:   13.90 cm/s LVOT diam:     1.50 cm  LV E/e' lateral: 3.5 LV SV:         28 LV SV Index:   14 LVOT Area:     1.77 cm  RIGHT VENTRICLE            IVC RV Basal diam:  2.90 cm    IVC diam: 1.80 cm RV S prime:     9.36 cm/s TAPSE (M-mode): 1.9 cm LEFT ATRIUM             Index       RIGHT ATRIUM           Index LA diam:        3.30 cm 1.58 cm/m  RA Area:     13.90 cm LA Vol (A2C):   34.3 ml 16.40 ml/m RA Volume:   34.50 ml  16.50 ml/m LA Vol (A4C):   19.6 ml 9.37 ml/m LA Biplane Vol: 27.9 ml 13.34 ml/m  AORTIC VALVE LVOT Vmax:   93.30 cm/s LVOT Vmean:  55.500 cm/s LVOT VTI:    0.161 m  AORTA Ao Root diam: 2.70 cm Ao Asc diam:  2.80 cm MITRAL VALVE               TRICUSPID VALVE MV Area (PHT): 1.94 cm    TR Peak grad:   23.6 mmHg MV Decel Time: 391 msec    TR Vmax:        243.00 cm/s MV E velocity: 48.70 cm/s MV A velocity: 58.60 cm/s  SHUNTS MV E/A ratio:  0.83        Systemic VTI:  0.16 m                            Systemic Diam: 1.50 cm Mertie Moores MD Electronically signed by Mertie Moores MD Signature Date/Time: 12/13/2020/12:34:22 PM    Final    VAS [name] LOWER EXTREMITY VENOUS (DVT)  Result Date: 12/13/2020  Lower Venous DVT Study Indications: Pulmonary embolism.  Risk Factors: None identified. Limitations: Body habitus and poor ultrasound/tissue interface. Comparison Study: No prior studies. Performing Technologist: Oliver Hum RVT  Examination Guidelines: A complete evaluation includes B-mode imaging, spectral Doppler, color Doppler, and power Doppler as needed of all accessible portions of each vessel. Bilateral testing is considered an integral part of a complete examination. Limited examinations for reoccurring indications may be performed as noted. The reflux portion of the exam is performed with the patient in reverse Trendelenburg.   +---------+---------------+---------+-----------+----------+--------------+ RIGHT    CompressibilityPhasicitySpontaneityPropertiesThrombus Aging +---------+---------------+---------+-----------+----------+--------------+ CFV      Full           Yes      Yes                                 +---------+---------------+---------+-----------+----------+--------------+ SFJ      Full                                                        +---------+---------------+---------+-----------+----------+--------------+  FV Prox  Full                                                        +---------+---------------+---------+-----------+----------+--------------+ FV Mid   Full                                                        +---------+---------------+---------+-----------+----------+--------------+ FV DistalFull                                                        +---------+---------------+---------+-----------+----------+--------------+ PFV      Full                                                        +---------+---------------+---------+-----------+----------+--------------+ POP      Partial        Yes      Yes                  Acute          +---------+---------------+---------+-----------+----------+--------------+ PTV      Full                                                        +---------+---------------+---------+-----------+----------+--------------+ PERO     Full                                                        +---------+---------------+---------+-----------+----------+--------------+ Gastroc  Full                                                        +---------+---------------+---------+-----------+----------+--------------+   +---------+---------------+---------+-----------+----------+--------------+ LEFT     CompressibilityPhasicitySpontaneityPropertiesThrombus Aging  +---------+---------------+---------+-----------+----------+--------------+ CFV      Full           Yes      Yes                                 +---------+---------------+---------+-----------+----------+--------------+ SFJ      Full                                                        +---------+---------------+---------+-----------+----------+--------------+  FV Prox  Full                                                        +---------+---------------+---------+-----------+----------+--------------+ FV Mid   Full                                                        +---------+---------------+---------+-----------+----------+--------------+ FV DistalFull                                                        +---------+---------------+---------+-----------+----------+--------------+ PFV      Full                                                        +---------+---------------+---------+-----------+----------+--------------+ POP      Full           Yes      Yes                                 +---------+---------------+---------+-----------+----------+--------------+ PTV      Full                                                        +---------+---------------+---------+-----------+----------+--------------+ PERO     Full                                                        +---------+---------------+---------+-----------+----------+--------------+     Summary: RIGHT: - Findings consistent with acute deep vein thrombosis involving the right popliteal vein. - No cystic structure found in the popliteal fossa.  LEFT: - There is no evidence of deep vein thrombosis in the lower extremity. However, portions of this examination were limited- see technologist comments above.  - No cystic structure found in the popliteal fossa.  *See table(s) above for measurements and observations. Electronically signed by Deitra Mayo MD on 12/13/2020 at 1:49:26  PM.    Final      Labs:   Basic Metabolic Panel: Recent Labs  Lab 12/12/20 1535 12/13/20 0251  NA 138 135  K 3.8 3.6  CL 111 109  CO2 18* 15*  GLUCOSE 105* 95  BUN 16 15  CREATININE 0.90 0.74  CALCIUM 9.0 8.6*   GFR Estimated Creatinine Clearance: 103.8 mL/min (by C-G formula based on SCr of 0.74 mg/dL). Liver Function Tests: No results for input(s): AST, ALT, ALKPHOS, BILITOT, PROT, ALBUMIN in the last 168 hours. No results  for input(s): LIPASE, AMYLASE in the last 168 hours. No results for input(s): AMMONIA in the last 168 hours. Coagulation profile Recent Labs  Lab 12/12/20 1600  INR 1.0    CBC: Recent Labs  Lab 12/12/20 1535 12/13/20 0251 12/13/20 0618 12/14/20 0308  WBC 17.2* 12.3* 12.0* 8.2  NEUTROABS 13.4*  --   --   --   HGB 16.4* 15.5* 15.4* 14.0  HCT 49.8* 47.9* 45.9 42.4  MCV 92.6 93.7 93.1 92.8  PLT 224 206 185 181   Cardiac Enzymes: No results for input(s): CKTOTAL, CKMB, CKMBINDEX, TROPONINI in the last 168 hours. BNP: Invalid input(s): POCBNP CBG: No results for input(s): GLUCAP in the last 168 hours. D-Dimer No results for input(s): DDIMER in the last 72 hours. Hgb A1c No results for input(s): HGBA1C in the last 72 hours. Lipid Profile No results for input(s): CHOL, HDL, LDLCALC, TRIG, CHOLHDL, LDLDIRECT in the last 72 hours. Thyroid function studies No results for input(s): TSH, T4TOTAL, T3FREE, THYROIDAB in the last 72 hours.  Invalid input(s): FREET3 Anemia work up No results for input(s): VITAMINB12, FOLATE, FERRITIN, TIBC, IRON, RETICCTPCT in the last 72 hours. Microbiology Recent Results (from the past 240 hour(s))  SARS CORONAVIRUS 2 (TAT 6-24 HRS) Nasopharyngeal Nasopharyngeal Swab     Status: None   Collection Time: 12/12/20  5:05 PM   Specimen: Nasopharyngeal Swab  Result Value Ref Range Status   SARS Coronavirus 2 NEGATIVE NEGATIVE Final    Comment: (NOTE) SARS-CoV-2 target nucleic acids are NOT DETECTED.  The  SARS-CoV-2 RNA is generally detectable in upper and lower respiratory specimens during the acute phase of infection. Negative results do not preclude SARS-CoV-2 infection, do not rule out co-infections with other pathogens, and should not be used as the sole basis for treatment or other patient management decisions. Negative results must be combined with clinical observations, patient history, and epidemiological information. The expected result is Negative.  Fact Sheet for Patients: SugarRoll.be  Fact Sheet for Healthcare Providers: https://www.woods-mathews.com/  This test is not yet approved or cleared by the [place] FDA and  has been authorized for detection and/or diagnosis of SARS-CoV-2 by FDA under an Emergency Use Authorization (EUA). This EUA will remain  in effect (meaning this test can be used) for the duration of the COVID-19 declaration under Se ction 564(b)(1) of the Act, 21 U.S.C. section 360bbb-3(b)(1), unless the authorization is terminated or revoked sooner.  Performed at [facility], [place] [address], [place], [place] [address]   MRSA PCR Screening     Status: None   Collection Time: 12/12/20  9:58 PM   Specimen: Nasal Mucosa; Nasopharyngeal  Result Value Ref Range Status   MRSA by PCR NEGATIVE NEGATIVE Final    Comment:        The GeneXpert MRSA Assay (FDA approved for NASAL specimens only), is one component of a comprehensive MRSA colonization surveillance program. It is not intended to diagnose MRSA infection nor to guide or monitor treatment for MRSA infections. Performed at [facility], [place] [address], [place], [place] [address]      Signed: Marlowe Aschoff Vala Raffo  [facility] 12/14/2020, 11:48 AM

## 2020-12-14 NOTE — Progress Notes (Signed)
Patient education provided. Patient verbalizes understandings of discharge instructions and when to follow up with primary. Took patient out of hospital in a wheelchair at 1340. Patient was picked up by husband.

## 2020-12-14 NOTE — Progress Notes (Signed)
[place] for IV heparin Indication: pulmonary embolus  Allergies  Allergen Reactions  . Codeine Other (See Comments)    "whole body turned bright red, swelled a little" with V codiene  . Lyrica [Pregabalin] Other (See Comments)    "suicidal thoughts"  . Nyquil Multi-Symptom [Pseudoeph-Doxylamine-Dm-Apap] Other (See Comments)    Turns skin red *all over body*, pt reports getting very hot   . Sulfa Antibiotics Other (See Comments)    "body turned red"    Patient Measurements: Height: 5\' 5"  (165.1 cm) Weight: 103.4 kg (227 lb 15.3 oz) IBW/kg (Calculated) : 57 Heparin Dosing Weight: 81.3 kg  Vital Signs: Temp: 98.4 F (36.9 C) (04/13 0300) Temp Source: Oral (04/13 0300) BP: 101/57 (04/13 0400) Pulse Rate: 79 (04/13 0400)  Labs: Recent Labs    12/12/20 1535 12/12/20 1535 12/12/20 1600 12/12/20 1736 12/13/20 0251 12/13/20 0618 12/13/20 1215 12/13/20 1814 12/14/20 0308  HGB 16.4*  --   --   --  15.5* 15.4*  --   --  14.0  HCT 49.8*  --   --   --  47.9* 45.9  --   --  42.4  PLT 224  --   --   --  206 185  --   --  181  APTT  --    < > 33  --   --   --  73* 88* 88*  LABPROT  --   --  13.2  --   --   --   --   --   --   INR  --   --  1.0  --   --   --   --   --   --   HEPARINUNFRC  --   --   --   --   --   --  >1.10*  --  0.66  CREATININE 0.90  --   --   --  0.74  --   --   --   --   TROPONINIHS 264*  --   --  202*  --   --   --   --   --    < > = values in this interval not displayed.    Estimated Creatinine Clearance: 103.8 mL/min (by C-G formula based on SCr of 0.74 mg/dL).  Assessment: 50 yo female presents with progressive dyspnea and chest pain x1-2 days. CTA shows large bilateral PE with saddle embolus and RH strain. Pharmacy initially consulted for Eliquis, then changed to IV heparin after Eliquis 10 mg given x 1 dose.   Baseline INR, aPTT: WNL  Prior anticoagulation: none  Significant events:  Today,  12/14/2020:  AM CBC: remains WNL  Daily aPTT remains therapeutic and stable on 1500 units/hr  No bleeding or infusion issues per nursing  R Pop DVT noted on LE doppler  Goal of Therapy: Heparin level 0.3-0.7 units/ml APTT 66-102 sec Monitor platelets by anticoagulation protocol: Yes  Plan:  Continue heparin IV infusion at 1500 units/hr  Daily CBC and heparin level; aPTT as needed while DOAC effects present  Monitor for signs of bleeding or thrombosis  Per CCM, likely transition back to Eliquis today hr if remains stable  Reuel Boom, PharmD, BCPS [phone] 12/14/2020, 7:48 AM

## 2020-12-15 ENCOUNTER — Other Ambulatory Visit: Payer: Self-pay | Admitting: *Deleted

## 2020-12-15 ENCOUNTER — Telehealth: Payer: Self-pay

## 2020-12-15 ENCOUNTER — Encounter: Payer: Self-pay | Admitting: *Deleted

## 2020-12-15 NOTE — Patient Outreach (Signed)
[facility]) Care Management  12/15/2020  [name] April 04, 1971 [id number]   Transition of care call/case closure   Referral received:12/13/20 Initial outreach:12/15/20 Insurance: [employer]   Subjective: Initial successful telephone call to patient's preferred number in order to complete transition of care assessment; 2 HIPAA identifiers verified. Explained purpose of call and completed transition of care assessment.  [name] states that she is doing much better. She reports tolerating moving around in home much better, reports only having some shortness of breath when walking she reports oxygen level greater than 90's. She denies having chest pain, palpitations reports having some cramping in right leg. She is awaiting return call from provider office regarding post discharge appointment.   She reports tolerating diet, denies bowel or bladder problems.  Spouse is  assisting with her  recovery.   Reviewed accessing the following [place] Benefits : She discussed history of hypertension and is active with healthy weight management and hypertension education.   She states that she does  have the hospital indemnity plan, provided [employer] contact number to file a claim  She uses a [facility] outpatient pharmacy at [place].      Objective:  Per electronic medical record [name] was hospitalized at [facility] 4/11-4/13/22 for Submassive Pulmonary embolus with right heart strain, right leg  DVT.  Comorbidities include: Hypertension, Obesity,  She was discharged to home on 12/14/20 without the need for home health services or DME.   Assessment:  Patient voices good understanding of all discharge instructions.  See transition of care flowsheet for assessment details.   Plan:  Reviewed hospital discharge diagnosis of Pulmonary embolus, Right leg DVT   and discharge treatment plan using hospital discharge instructions, assessing  medication adherence, reviewing problems requiring provider notification, and discussing the importance of follow up with surgeon, primary care provider and/or specialists as directed.  Reviewed [place] healthy lifestyle program information to receive discounted premium for  2023   Step 1: Get  your annual physical  Step 2: Complete your health assessment  Step 3:Identify your current health status and complete the corresponding action step between September 03, 2020 and May 04, 2021.     No ongoing care management needs identified so will close case to [place] Management services and route successful outreach letter with [facility] Management pamphlet and 24 Hour Nurse Line Magnet to [facility] Management clinical pool to be mailed to patient's home address.   Thanked patient for their services to [facility].   Joylene Draft, RN, BSN  [place] Management Coordinator  [phone]- Mobile [phone]- Toll Free Main Office

## 2020-12-15 NOTE — Telephone Encounter (Signed)
Transition Care Management Follow-up Telephone Call  Date of discharge and from where: 12/14/20 from [facility]  How have you been since you were released from the hospital? 90% better, gets out of breath easy, but she just takes frequent breaks  Any questions or concerns? Yes Spouse may need FMLA papers as she isn't to be alone for the next couple of weeks; I advised her to fax them to [name] and we'd let them know  Items Reviewed:  Did the pt receive and understand the discharge instructions provided? Yes   Medications obtained and verified? Yes   Other? No   Any new allergies since your discharge? No   Dietary orders reviewed? Yes  Do you have support at home? Yes   Home Care and Equipment/Supplies: Were home health services ordered? no Were any new equipment or medical supplies ordered?  No   Functional Questionnaire: (I = Independent and D = Dependent) ADLs: I  Bathing/Dressing- I  Meal Prep- I  Eating- I  Maintaining continence- I  Transferring/Ambulation- I  Managing Meds- I  Follow up appointments reviewed:   PCP Hospital f/u appt confirmed? Yes  Scheduled to see [name] on 12/22/20 @ 2:10.  [facility] f/u appt confirmed? Yes  Scheduled to see Hemotology next week and Pulmonology in July   Are transportation arrangements needed? No   If their condition worsens, is the pt aware to call PCP or go to the Emergency Dept.? Yes  Was the patient provided with contact information for the PCP's office or ED? Yes  Was to pt encouraged to call back with questions or concerns? Yes

## 2020-12-15 NOTE — Telephone Encounter (Signed)
Transition Care Management Unsuccessful Follow-up Telephone Call  Date of discharge and from where:  12/14/20 [name]  Attempts:  1st Attempt  Reason for unsuccessful TCM follow-up call:  Left voice message

## 2020-12-16 ENCOUNTER — Encounter (INDEPENDENT_AMBULATORY_CARE_PROVIDER_SITE_OTHER): Payer: Self-pay | Admitting: Adult Health

## 2020-12-22 ENCOUNTER — Other Ambulatory Visit (HOSPITAL_COMMUNITY): Payer: Self-pay

## 2020-12-22 ENCOUNTER — Ambulatory Visit: Payer: 59 | Admitting: Family

## 2020-12-22 ENCOUNTER — Encounter: Payer: Self-pay | Admitting: Family

## 2020-12-22 ENCOUNTER — Other Ambulatory Visit: Payer: Self-pay

## 2020-12-22 ENCOUNTER — Other Ambulatory Visit (HOSPITAL_BASED_OUTPATIENT_CLINIC_OR_DEPARTMENT_OTHER): Payer: Self-pay

## 2020-12-22 VITALS — BP 101/62 | HR 83 | Temp 97.8°F | Ht 65.0 in | Wt 236.8 lb

## 2020-12-22 DIAGNOSIS — Z09 Encounter for follow-up examination after completed treatment for conditions other than malignant neoplasm: Secondary | ICD-10-CM | POA: Diagnosis not present

## 2020-12-22 DIAGNOSIS — Z86711 Personal history of pulmonary embolism: Secondary | ICD-10-CM | POA: Diagnosis not present

## 2020-12-22 DIAGNOSIS — E669 Obesity, unspecified: Secondary | ICD-10-CM | POA: Diagnosis not present

## 2020-12-22 DIAGNOSIS — R0602 Shortness of breath: Secondary | ICD-10-CM | POA: Diagnosis not present

## 2020-12-22 MED FILL — Montelukast Sodium Tab 10 MG (Base Equiv): ORAL | 90 days supply | Qty: 90 | Fill #0 | Status: AC

## 2020-12-22 NOTE — Patient Instructions (Signed)
Pulmonary Embolism  A pulmonary embolism (PE) is a sudden blockage or decrease of blood flow in one or both lungs that happens when a clot travels into the arteries of the lung (pulmonary arteries). Most blockages come from a blood clot that forms in the vein of a leg or arm (deep vein thrombosis, DVT) and travels to the lungs. A clot is blood that has thickened into a gel or solid. PE is a dangerous and life-threatening condition that needs to be treated right away. What are the causes? This condition is usually caused by a blood clot that forms in a vein and moves to the lungs. In rare cases, it may be caused by air, fat, part of a tumor, or other tissue that moves through the veins and into the lungs. What increases the risk? The following factors may make you more likely to develop this condition:  Experiencing a traumatic injury, such as breaking a hip or leg.  Having: ? A spinal cord injury. ? Major surgery, especially hip or knee replacement, or surgery on parts of the nervous system or on the abdomen. ? A stroke. ? A blood clotting disease. ? Long-term (chronic) lung or heart disease. ? Cancer, especially if you are being treated with chemotherapy. ? A central venous catheter.  Taking medicines that contain estrogen. These include birth control pills and hormone replacement therapy.  Being: ? Pregnant. ? In the period of time after your baby is delivered (postpartum). ? Older than age 60. ? Overweight. ? A smoker, especially if you have other risks. ? Not very active (sedentary), not being able to move at all, or spending long periods sitting, such as travel over 6 hours. You are also at a greater risk if you have a leg in a cast or splint. What are the signs or symptoms? Symptoms of this condition usually start suddenly and include:  Shortness of breath during activity or at rest.  Coughing, coughing up blood, or coughing up bloody mucus.  Chest pain, back pain, or  shoulder blade pain that gets worse with deep breaths.  Rapid or irregular heartbeat.  Feeling light-headed or dizzy, or fainting.  Feeling anxious.  Pain and swelling in a leg. This is a symptom of DVT, which can lead to PE. How is this diagnosed? This condition may be diagnosed based on your medical history, a physical exam, and tests. Tests may include:  Blood tests.  An ECG (electrocardiogram) of the heart.  A CT pulmonary angiogram. This test checks blood flow in and around your lungs.  A ventilation-perfusion scan, also called a lung VQ scan. This test measures air flow and blood flow to the lungs.  An ultrasound to check for a DVT. How is this treated? Treatment for this condition depends on many factors, such as the cause of your PE, your risk for bleeding or developing more clots, and other medical conditions you may have. Treatment aims to stop blood clots from forming or growing larger. In some cases, treatment may be aimed at breaking apart or removing the blood clot. Treatment may include:  Medicines, such as: ? Blood thinning medicines, also called anticoagulants, to stop clots from forming and growing. ? Medicines that break apart clots (thrombolytics).  Procedures, such as: ? Using a flexible tube to remove a blood clot (embolectomy) or to deliver medicine to destroy it (catheter-directed thrombolysis). ? Surgery to remove the clot (surgical embolectomy). This is rare. You may need a combination of immediate, long-term, and extended   treatments. Your treatment may continue for several months (maintenance therapy) or longer depending on your medical conditions. You and your health care provider will work together to choose the treatment program that is best for you. Follow these instructions at home: Medicines  Take over-the-counter and prescription medicines only as told by your health care provider.  If you are taking blood thinners: ? Talk with your health care  provider before you take any medicines that contain aspirin or NSAIDs, such as ibuprofen. These medicines increase your risk for dangerous bleeding. ? Take your medicine exactly as told, at the same time every day. ? Avoid activities that could cause injury or bruising, and follow instructions about how to prevent falls. ? Wear a medical alert bracelet or carry a card that lists what medicines you take.  Understand what foods and drugs interact with any medicines that you are taking. General instructions  Ask your health care provider when you may return to your normal activities. Avoid sitting or lying for a long time without moving.  Maintain a healthy weight. Ask your health care provider what weight is healthy for you.  Do not use any products that contain nicotine or tobacco, such as cigarettes, e-cigarettes, and chewing tobacco. If you need help quitting, ask your health care provider.  Talk with your health care provider about any travel plans. It is important to make sure that you are still able to take your medicine while traveling.  Keep all follow-up visits as told by your health care provider. This is important. Where to find more information  American Lung Association: www.lung.org  Centers for Disease Control and Prevention: www.cdc.gov Contact a health care provider if:  You missed a dose of your blood thinner medicine. Get help right away if you:  Have: ? New or increased pain, swelling, warmth, or redness in an arm or leg. ? Shortness of breath that gets worse during activity or at rest. ? A fever. ? Worsening chest pain. ? A rapid or irregular heartbeat. ? A severe headache. ? Vision changes. ? A serious fall or accident, or you hit your head. ? Stomach pain. ? Blood in your vomit, stool, or urine. ? A cut that will not stop bleeding.  Cough up blood.  Feel light-headed or dizzy, and that feeling does not go away.  Cannot move your arms or legs.  Are  confused or have memory loss. These symptoms may represent a serious problem that is an emergency. Do not wait to see if the symptoms will go away. Get medical help right away. Call your local emergency services (911 in the [place] Do not drive yourself to the hospital. Summary  A pulmonary embolism (PE) is a serious and potentially life-threatening condition, in which a blood clot from one part of the body (deep vein thrombosis, DVT) travels to the arteries of the lung, causing a sudden blockage or decrease of blood flow to the lungs. This may result in shortness of breath, chest pain, dizziness, and fainting.  Treatments for this condition usually include medicines to thin your blood (anticoagulants) or medicines to break apart blood clots (thrombolytics).  If you are given blood thinners, take your medicine exactly as told by your health care provider, at the same time every day. This is important.  Understand what foods and drugs interact with any medicines that you are taking.  If you have signs of PE or DVT, call your local emergency services (911 in the [place] This information is not   intended to replace advice given to you by your health care provider. Make sure you discuss any questions you have with your health care provider. Document Revised: 07/03/2019 Document Reviewed: 07/03/2019 Elsevier Patient Education  2021 Elsevier Inc.  

## 2020-12-22 NOTE — Progress Notes (Signed)
Subjective:    Patient ID: [name], female    DOB: 09-Mar-1971, 50 y.o.   MRN: [id number]  Chief Complaint  Patient presents with  . Hospitalization Follow-up    Wants to see if she is able to go back to work.  Admitted 4/11, come home on 4/13   Pt presents to the office today for hospital follow up. She was seen in our office for SOB and CT scan was ordered and found to have acute saddle pulmonary embolism. She was admitted on 12/13/19 and discharged on 12/14/20.    She was started on a heparin drip and then discharged on Eliquis. She was recommend to continue 3 to 6 months. She will need a referral to hematologists for work up as she did not have any triggers for DVT /PE.  He has follow up ECHO and appt with Pulmonologists on 03/2021   O2 98 % today on room air. Denies SOB while sitting, but while walking she does.  Shortness of Breath This is a new problem. The current episode started 1 to 4 weeks ago. The problem occurs intermittently. The problem has been waxing and waning. Pertinent negatives include no leg pain or leg swelling.      Review of Systems  Respiratory: Positive for shortness of breath.   Cardiovascular: Negative for leg swelling.  All other systems reviewed and are negative.      Objective:   Physical Exam Vitals reviewed.  Constitutional:      General: She is not in acute distress.    Appearance: She is well-developed. She is obese.  HENT:     Head: Normocephalic and atraumatic.     Right Ear: Tympanic membrane normal.     Left Ear: Tympanic membrane normal.  Eyes:     Pupils: Pupils are equal, round, and reactive to light.  Neck:     Thyroid: No thyromegaly.  Cardiovascular:     Rate and Rhythm: Normal rate and regular rhythm.     Heart sounds: Normal heart sounds. No murmur heard.   Pulmonary:     Effort: Pulmonary effort is normal. No respiratory distress.     Breath sounds: Normal breath sounds. No wheezing.     Comments: SOB with  exertion  Abdominal:     General: Bowel sounds are normal. There is no distension.     Palpations: Abdomen is soft.     Tenderness: There is no abdominal tenderness.  Musculoskeletal:        General: No tenderness. Normal range of motion.     Cervical back: Normal range of motion and neck supple.  Skin:    General: Skin is warm and dry.  Neurological:     Mental Status: She is alert and oriented to person, place, and time.     Cranial Nerves: No cranial nerve deficit.     Deep Tendon Reflexes: Reflexes are normal and symmetric.  Psychiatric:        Behavior: Behavior normal.        Thought Content: Thought content normal.        Judgment: Judgment normal.          BP 101/62   Pulse 83   Temp 97.8 F (36.6 C)   Ht $R'5\' 5"'OO$  (1.651 m)   Wt 236 lb 12.8 oz (107.4 kg)   LMP 11/23/2020 (Approximate)   SpO2 98%   BMI 39.41 kg/m   Assessment & Plan:  [name] comes in today with  chief complaint of Hospitalization Follow-up (Wants to see if she is able to go back to work. /Admitted 4/11, come home on 4/13)   Diagnosis and orders addressed:  1. Hospital discharge follow-up - CMP14+EGFR - CBC with Differential/Platelet  2. Hx pulmonary embolism - CMP14+EGFR - CBC with Differential/Platelet  3. Obesity (BMI 30-39.9) - CMP14+EGFR - CBC with Differential/Platelet  4. SOB (shortness of breath) - CMP14+EGFR - CBC with Differential/Platelet   Labs pending Forms completed for FMLA Health Maintenance reviewed Diet and exercise encouraged  Follow up plan: 2 months    [name], FNP

## 2020-12-23 ENCOUNTER — Other Ambulatory Visit: Payer: Self-pay | Admitting: Family

## 2020-12-23 LAB — CBC WITH DIFFERENTIAL/PLATELET
Basophils Absolute: 0.1 10*3/uL (ref 0.0–0.2)
Basos: 1 %
EOS (ABSOLUTE): 0.3 10*3/uL (ref 0.0–0.4)
Eos: 3 %
Hematocrit: 45.4 % (ref 34.0–46.6)
Hemoglobin: 15 g/dL (ref 11.1–15.9)
Immature Grans (Abs): 0 10*3/uL (ref 0.0–0.1)
Immature Granulocytes: 0 %
Lymphocytes Absolute: 2.2 10*3/uL (ref 0.7–3.1)
Lymphs: 27 %
MCH: 30.5 pg (ref 26.6–33.0)
MCHC: 33 g/dL (ref 31.5–35.7)
MCV: 93 fL (ref 79–97)
Monocytes Absolute: 0.5 10*3/uL (ref 0.1–0.9)
Monocytes: 6 %
Neutrophils Absolute: 5.4 10*3/uL (ref 1.4–7.0)
Neutrophils: 63 %
Platelets: 257 10*3/uL (ref 150–450)
RBC: 4.91 x10E6/uL (ref 3.77–5.28)
RDW: 12.5 % (ref 11.7–15.4)
WBC: 8.4 10*3/uL (ref 3.4–10.8)

## 2020-12-23 LAB — CMP14+EGFR
ALT: 25 IU/L (ref 0–32)
AST: 11 IU/L (ref 0–40)
Albumin/Globulin Ratio: 1.4 (ref 1.2–2.2)
Albumin: 4.2 g/dL (ref 3.8–4.8)
Alkaline Phosphatase: 48 IU/L (ref 44–121)
BUN/Creatinine Ratio: 21 (ref 9–23)
BUN: 20 mg/dL (ref 6–24)
Bilirubin Total: <0.2 mg/dL (ref 0.0–1.2)
CO2: 20 mmol/L (ref 20–29)
Calcium: 10.2 mg/dL (ref 8.7–10.2)
Chloride: 104 mmol/L (ref 96–106)
Creatinine, Ser: 0.94 mg/dL (ref 0.57–1.00)
Globulin, Total: 3 g/dL (ref 1.5–4.5)
Glucose: 95 mg/dL (ref 65–99)
Potassium: 4.3 mmol/L (ref 3.5–5.2)
Sodium: 140 mmol/L (ref 134–144)
Total Protein: 7.2 g/dL (ref 6.0–8.5)
eGFR: 75 mL/min/{1.73_m2} (ref >59)

## 2021-01-02 ENCOUNTER — Ambulatory Visit (INDEPENDENT_AMBULATORY_CARE_PROVIDER_SITE_OTHER): Payer: 59 | Admitting: Adult Health

## 2021-01-02 ENCOUNTER — Other Ambulatory Visit: Payer: Self-pay

## 2021-01-02 ENCOUNTER — Encounter (INDEPENDENT_AMBULATORY_CARE_PROVIDER_SITE_OTHER): Payer: Self-pay | Admitting: Adult Health

## 2021-01-02 VITALS — BP 121/78 | HR 88 | Temp 97.6°F | Ht 65.0 in | Wt 236.0 lb

## 2021-01-02 DIAGNOSIS — Z86711 Personal history of pulmonary embolism: Secondary | ICD-10-CM | POA: Diagnosis not present

## 2021-01-02 DIAGNOSIS — Z6839 Body mass index (BMI) 39.0-39.9, adult: Secondary | ICD-10-CM

## 2021-01-02 DIAGNOSIS — E876 Hypokalemia: Secondary | ICD-10-CM | POA: Diagnosis not present

## 2021-01-03 ENCOUNTER — Other Ambulatory Visit: Payer: Self-pay | Admitting: Family

## 2021-01-03 DIAGNOSIS — I2602 Saddle embolus of pulmonary artery with acute cor pulmonale: Secondary | ICD-10-CM

## 2021-01-03 DIAGNOSIS — Z86711 Personal history of pulmonary embolism: Secondary | ICD-10-CM | POA: Insufficient documentation

## 2021-01-03 NOTE — Progress Notes (Signed)
Chief Complaint:   OBESITY [name] is here to discuss her progress with her obesity treatment plan along with follow-up of her obesity related diagnoses. [name] is on the Category 3 Plan and states she is following her eating plan approximately 50% of the time. [name] states she is not currently exercising.  Today's visit was #: 26 Starting weight: 250 lbs Starting date: 09/08/2018 Today's weight: 236 lbs Today's date: 01/02/2021 Total lbs lost to date: 14 Total lbs lost since last in-office visit: 0  Interim History: [name] experienced acute saddle pulmonary embolism- hospital admission 12/12/2020-12/14/2020. She was discharged on Eliquis with a minimum course for 3-6 months and referred to Hematologist. She has an appointment with pulmonologist 03/2021. Her PCP repeated labs 12/22/2020, and stable results reviewed with pt today. Immediately after being discharged, pt was reliant on fast food for the first week.  She has slowly phased back onto Cat 3 meal plan the last several days.   Subjective:   1. Hx pulmonary embolism Continue with Eliquis as directed. [name] denies unusual bleeding/bruising. She has yet to establish with hematology.  2. Hypokalemia 12/22/2020 potassium stable at 4.3. [name] denies palpitations.  Assessment/Plan:   1. Hx pulmonary embolism Call hematology to make initial appointment.  2. Hypokalemia Continue to monitor.  3. Obesity with current BMI 39.3  [name] is currently in the action stage of change. As such, her goal is to continue with weight loss efforts. She has agreed to the Category 3 Plan.   Resume category 3 meal plan. Activity as tolerated. Continue Saxenda 1.2 mg.  She denies injection site bleeding or bruising since starting Eliquis. Exercise goals: No exercise has been prescribed at this time.  Behavioral modification strategies: increasing lean protein intake, meal planning and cooking strategies and planning for success.  [name] has agreed to follow-up  with our clinic in 3 weeks. She was informed of the importance of frequent follow-up visits to maximize her success with intensive lifestyle modifications for her multiple health conditions.   Objective:   Blood pressure 121/78, pulse 88, temperature 97.6 F (36.4 C), height 5\' 5"  (1.651 m), weight 236 lb (107 kg), SpO2 97 %. Body mass index is 39.27 kg/m.  General: Cooperative, alert, well developed, in no acute distress. HEENT: Conjunctivae and lids unremarkable. Cardiovascular: Regular rhythm.  Lungs: Normal work of breathing. Neurologic: No focal deficits.   Lab Results  Component Value Date   CREATININE 0.94 12/22/2020   BUN 20 12/22/2020   NA 140 12/22/2020   K 4.3 12/22/2020   CL 104 12/22/2020   CO2 20 12/22/2020   Lab Results  Component Value Date   ALT 25 12/22/2020   AST 11 12/22/2020   ALKPHOS 48 12/22/2020   BILITOT <0.2 12/22/2020   Lab Results  Component Value Date   HGBA1C 5.0 06/13/2020   HGBA1C 5.1 12/21/2019   HGBA1C 5.1 05/04/2019   HGBA1C 5.1 01/29/2019   HGBA1C 5.2 09/08/2018   Lab Results  Component Value Date   INSULIN 18.2 06/13/2020   INSULIN 22.1 12/21/2019   INSULIN 13.2 05/04/2019   INSULIN 16.1 01/29/2019   INSULIN 16.9 09/08/2018   Lab Results  Component Value Date   TSH 2.810 09/15/2020   Lab Results  Component Value Date   CHOL 177 09/15/2020   HDL 58 09/15/2020   LDLCALC 105 (H) 09/15/2020   TRIG 72 09/15/2020   CHOLHDL 3.1 09/15/2020   Lab Results  Component Value Date   WBC 8.4 12/22/2020  HGB 15.0 12/22/2020   HCT 45.4 12/22/2020   MCV 93 12/22/2020   PLT 257 12/22/2020    Attestation Statements:   Reviewed by clinician on day of visit: allergies, medications, problem list, medical history, surgical history, family history, social history, and previous encounter notes.  Time spent on visit including pre-visit chart review and post-visit care and charting was 32 minutes.   Coral Ceo, am acting as  Location manager for Mina Marble, NP.  I have reviewed the above documentation for accuracy and completeness, and I agree with the above. -  Fabiha Rougeau d. Dawan Farney, NP-C

## 2021-01-04 ENCOUNTER — Other Ambulatory Visit: Payer: Self-pay | Admitting: Internal Medicine

## 2021-01-04 ENCOUNTER — Other Ambulatory Visit (HOSPITAL_COMMUNITY): Payer: Self-pay

## 2021-01-04 ENCOUNTER — Other Ambulatory Visit: Payer: Self-pay | Admitting: Family

## 2021-01-05 ENCOUNTER — Other Ambulatory Visit (HOSPITAL_COMMUNITY): Payer: Self-pay

## 2021-01-05 MED ORDER — ELIQUIS 5 MG PO TABS
ORAL_TABLET | ORAL | 0 refills | Status: DC
  Filled 2021-01-05: qty 60, 30d supply, fill #0
  Filled 2021-02-04: qty 60, 30d supply, fill #1

## 2021-01-06 ENCOUNTER — Inpatient Hospital Stay: Payer: 59 | Attending: Physician Assistant | Admitting: Physician Assistant

## 2021-01-06 ENCOUNTER — Other Ambulatory Visit: Payer: Self-pay

## 2021-01-06 ENCOUNTER — Inpatient Hospital Stay: Payer: 59

## 2021-01-06 ENCOUNTER — Encounter: Payer: Self-pay | Admitting: Physician Assistant

## 2021-01-06 VITALS — BP 125/69 | HR 79 | Temp 97.8°F | Resp 18 | Ht 65.0 in | Wt 246.3 lb

## 2021-01-06 DIAGNOSIS — Z7901 Long term (current) use of anticoagulants: Secondary | ICD-10-CM | POA: Insufficient documentation

## 2021-01-06 DIAGNOSIS — I2699 Other pulmonary embolism without acute cor pulmonale: Secondary | ICD-10-CM | POA: Diagnosis not present

## 2021-01-06 DIAGNOSIS — I82401 Acute embolism and thrombosis of unspecified deep veins of right lower extremity: Secondary | ICD-10-CM | POA: Diagnosis not present

## 2021-01-06 DIAGNOSIS — I82431 Acute embolism and thrombosis of right popliteal vein: Secondary | ICD-10-CM

## 2021-01-06 DIAGNOSIS — Z5181 Encounter for therapeutic drug level monitoring: Secondary | ICD-10-CM | POA: Diagnosis not present

## 2021-01-06 DIAGNOSIS — Z86711 Personal history of pulmonary embolism: Secondary | ICD-10-CM

## 2021-01-06 DIAGNOSIS — Z79899 Other long term (current) drug therapy: Secondary | ICD-10-CM | POA: Insufficient documentation

## 2021-01-06 LAB — CBC WITH DIFFERENTIAL (CANCER CENTER ONLY)
Abs Immature Granulocytes: 0.03 10*3/uL (ref 0.00–0.07)
Basophils Absolute: 0 10*3/uL (ref 0.0–0.1)
Basophils Relative: 1 %
Eosinophils Absolute: 0.2 10*3/uL (ref 0.0–0.5)
Eosinophils Relative: 2 %
HCT: 41.3 % (ref 36.0–46.0)
Hemoglobin: 13.6 g/dL (ref 12.0–15.0)
Immature Granulocytes: 0 %
Lymphocytes Relative: 21 %
Lymphs Abs: 1.8 10*3/uL (ref 0.7–4.0)
MCH: 30.9 pg (ref 26.0–34.0)
MCHC: 32.9 g/dL (ref 30.0–36.0)
MCV: 93.9 fL (ref 80.0–100.0)
Monocytes Absolute: 0.5 10*3/uL (ref 0.1–1.0)
Monocytes Relative: 6 %
Neutro Abs: 6.1 10*3/uL (ref 1.7–7.7)
Neutrophils Relative %: 70 %
Platelet Count: 224 10*3/uL (ref 150–400)
RBC: 4.4 MIL/uL (ref 3.87–5.11)
RDW: 12.9 % (ref 11.5–15.5)
WBC Count: 8.6 10*3/uL (ref 4.0–10.5)
nRBC: 0 % (ref 0.0–0.2)

## 2021-01-06 LAB — CMP (CANCER CENTER ONLY)
ALT: 17 U/L (ref 0–44)
AST: 11 U/L — ABNORMAL LOW (ref 15–41)
Albumin: 3.4 g/dL — ABNORMAL LOW (ref 3.5–5.0)
Alkaline Phosphatase: 43 U/L (ref 38–126)
Anion gap: 8 (ref 5–15)
BUN: 17 mg/dL (ref 6–20)
CO2: 22 mmol/L (ref 22–32)
Calcium: 9.3 mg/dL (ref 8.9–10.3)
Chloride: 111 mmol/L (ref 98–111)
Creatinine: 0.75 mg/dL (ref 0.44–1.00)
GFR, Estimated: >60 mL/min (ref >60)
Glucose, Bld: 96 mg/dL (ref 70–99)
Potassium: 4.1 mmol/L (ref 3.5–5.1)
Sodium: 141 mmol/L (ref 135–145)
Total Bilirubin: 0.3 mg/dL (ref 0.3–1.2)
Total Protein: 7 g/dL (ref 6.5–8.1)

## 2021-01-06 NOTE — Progress Notes (Signed)
[place] Telephone:([phone]) [phone]   Fax:([phone]) [place] NOTE  Patient Care Team: Sharion Balloon, FNP as PCP - General (Family Medicine)  Hematological/Oncological History 1) 12/12/2020-12/14/2020: Admitted for bilateral pulmonary emboli with saddle embolus with CT evidence of right heart strain. Ultrasound of lower extremities confirmed right popliteal vein DVT. Patient was treated with heparin drip for 48 hours.  Echocardiogram showed EF of 65 to 70% and findings of RV overload related to pulmonary embolism. Patient was discharged on Eliquis10 mg twice daily for for 7 days followed by 5 mg twice daily.  2) 01/06/2021: Establish care with Dede Query PA-C  CHIEF COMPLAINTS/PURPOSE OF CONSULTATION:  "Acute pulmonary embolism "  HISTORY OF PRESENTING ILLNESS:  [name] 50 y.o. female with medical history significant for hypertension, pseudotumor cerebri, Chiari malformation, right aberrant subclavian artery stenosis s/p bypass and lactose intolerance. Patient is unaccompanied for this visit.   On review of the previous records, Ms. Moilanen presented to her PCP 12/12/2020 due to progressive dyspnea on exertion over 24 hours. Chest xray was unremarkable. CTA chest revealed bilateral pulmonary emboli with saddle embolus and evidence of right heart strain. Patient was admitted from 12/12/2020 until 12/14/2020. She underwent doppler US that confirmed right popliteal vein DVT. Echocardiogram showed EF of 65 to 70% and findings of RV overload related to pulmonary embolism. Patient was started on heparin drip for 48 hours and then transitioned to Eliquis at discharge.   On exam today, Ms. Serafini is feeling well since hospital discharge. Her shortness of breath has markedly improved. Her energy and appetite are stable. She is able to complete her daily activities on her own. She denies any nausea, vomiting or abdominal pain. Her bowel habits are unchanged  without any constipation or diarrhea. She has noticed some mild bruising in her legs but denies any signs of bleeding since starting Eliquis. Patient has history of nose bleeds due to seasonal allergies. Patient denies any fevers, chills, night sweats, chest pain,cough, edema or skin changes. She has no other complaints. Rest of the 10 point ROS is below.   MEDICAL HISTORY:  Past Medical History:  Diagnosis Date  . Asthma   . Chiari I malformation ([place])   . Chiari malformation 1996   decompression   . High blood pressure   . Hypertension   . Idiopathic intracranial hypertension   . Lactose intolerance   . Pseudotumor cerebri   . Subclavian bypass stenosis ([place]) 11/97   right  . Swallowing difficulty     SURGICAL HISTORY: Past Surgical History:  Procedure Laterality Date  . APPENDECTOMY    . chiari decompression    . GANGLION CYST EXCISION Right    wrist  . LAPAROSCOPIC APPENDECTOMY N/A 10/28/2015   Procedure: APPENDECTOMY LAPAROSCOPIC;  Surgeon: Armandina Gemma, MD;  Location: [facility];  Service: General;  Laterality: N/A;  . SUBCLAVIAN BYPASS GRAFT Right 07/1996  . WRIST FRACTURE SURGERY Right 02/2007    SOCIAL HISTORY: Social History   Socioeconomic History  . Marital status: Married    Spouse name: Charlotte Crumb  . Number of children: 0  . Years of education: 38  . Highest education level: Not on file  Occupational History  . Occupation: CT Engineer, production: [employer]  Tobacco Use  . Smoking status: Never Smoker  . Smokeless tobacco: Never Used  Vaping Use  . Vaping Use: Never used  Substance and Sexual Activity  . Alcohol use: No  Alcohol/week: 0.0 standard drinks  . Drug use: No  . Sexual activity: Not on file  Other Topics Concern  . Not on file  Social History Narrative   Married, no children, lives at home   Caffeine use- 2-3 cups coffee 5 days a week   Social Determinants of Radio broadcast assistant Strain: Not on file  Food Insecurity: Not on  file  Transportation Needs: Not on file  Physical Activity: Not on file  Stress: Not on file  Social Connections: Not on file  Intimate Partner Violence: Not on file    FAMILY HISTORY: Family History  Problem Relation Age of Onset  . Cataracts Mother   . Diabetes Mother   . Hypertension Mother   . High Cholesterol Mother   . Obesity Mother   . Diabetes Father   . Hypertension Father   . Obesity Father   . Atrial fibrillation Father   . Thyroid cancer Maternal Great-grandmother   . Lung cancer Paternal Grandmother     ALLERGIES:  is allergic to codeine, lyrica [pregabalin], nyquil multi-symptom [pseudoeph-doxylamine-dm-apap], and sulfa antibiotics.  MEDICATIONS:  Current Outpatient Medications  Medication Sig Dispense Refill  . acetaZOLAMIDE (DIAMOX) 250 MG tablet TAKE 1 TABLET BY MOUTH 2 TIMES DAILY (KEEP UPCOMING APPT) 180 tablet 3  . albuterol (VENTOLIN HFA) 108 (90 Base) MCG/ACT inhaler Inhale 2 puffs into the lungs every 4 (four) hours as needed for wheezing or shortness of breath. 18 g 2  . apixaban (ELIQUIS) 5 MG TABS tablet Take 2 tablets (10mg ) twice daily for 7 days, then 1 tablet (5mg ) twice daily 72 tablet 0  . Calcium Carbonate-Vitamin D (CALCIUM + D PO) Take 1 tablet by mouth daily. Calcium 600 mg, vit D3 300 mg    . cetirizine (ZYRTEC) 5 MG tablet Take 5 mg by mouth daily.    . cholecalciferol (VITAMIN D3) 25 MCG (1000 UT) tablet Take 2,000 Units by mouth daily.     . Cyanocobalamin (VITAMIN B 12 PO) Take 5,000 mcg by mouth daily.     . Echinacea 125 MG CAPS Take 125 mg by mouth daily.     . Liraglutide -Weight Management (SAXENDA) 18 MG/3ML SOPN Inject 0.5 mLs (3 mg total) into the skin daily. 15 mL 0  . lisinopril-hydrochlorothiazide (ZESTORETIC) 20-12.5 MG tablet TAKE 1 TABLET BY MOUTH ONCE A DAY 90 tablet 1  . Magnesium 250 MG TABS Take 250 mg by mouth daily.     . metroNIDAZOLE (METROCREAM) 0.75 % cream Apply topically 2 (two) times daily. 45 g 1  .  montelukast (SINGULAIR) 10 MG tablet TAKE 1 TABLET BY MOUTH AT BEDTIME 90 tablet 1  . Multiple Vitamin (MULTIVITAMIN) capsule Take 1 capsule by mouth daily.    . Oxymetazoline HCl (NASAL SPRAY NA) Place 2 sprays into the nose as needed (CONGESTION).     Marland Kitchen potassium chloride SA (KLOR-CON) 20 MEQ tablet TAKE 1 TABLET BY MOUTH DAILY (Patient taking differently: Take 10 mEq by mouth 2 (two) times daily.) 90 tablet 3  . UNIFINE PENTIPS 32G X 4 MM MISC USE WITH SAXENDA TO INJECT INTO SKIN DAILY 100 each 0  . MegaRed Omega-3 Krill Oil 500 MG CAPS Take 1 capsule by mouth daily. (Patient not taking: No sig reported)    . TART CHERRY PO Take 1 capsule by mouth daily. (Patient not taking: No sig reported)    . TURMERIC PO Take 1 capsule by mouth daily. (Patient not taking: No sig reported)  No current facility-administered medications for this visit.    REVIEW OF SYSTEMS:   Constitutional: ( - ) fevers, ( - )  chills , ( - ) night sweats Eyes: ( - ) blurriness of vision, ( - ) double vision, ( - ) watery eyes Ears, nose, mouth, throat, and face: ( - ) mucositis, ( - ) sore throat Respiratory: ( - ) cough, ( - ) dyspnea, ( - ) wheezes Cardiovascular: ( - ) palpitation, ( - ) chest discomfort, ( - ) lower extremity swelling Gastrointestinal:  ( - ) nausea, ( - ) heartburn, ( - ) change in bowel habits Skin: ( - ) abnormal skin rashes Lymphatics: ( - ) new lymphadenopathy, ( - ) easy bruising Neurological: ( - ) numbness, ( - ) tingling, ( - ) new weaknesses Behavioral/Psych: ( - ) mood change, ( - ) new changes  All other systems were reviewed with the patient and are negative.  PHYSICAL EXAMINATION: ECOG PERFORMANCE STATUS: 1 - Symptomatic but completely ambulatory  Vitals:   01/06/21 0853  BP: 125/69  Pulse: 79  Resp: 18  Temp: 97.8 F (36.6 C)  SpO2: 100%   Filed Weights   01/06/21 0853  Weight: 246 lb 4.8 oz (111.7 kg)    GENERAL: well appearing female in NAD  SKIN: skin color,  texture, turgor are normal, no rashes or significant lesions EYES: conjunctiva are pink and non-injected, sclera clear OROPHARYNX: no exudate, no erythema; lips, buccal mucosa, and tongue normal  NECK: supple, non-tender LYMPH:  no palpable lymphadenopathy in the cervical, axillary or supraclavicular lymph nodes.  LUNGS: clear to auscultation and percussion with normal breathing effort HEART: regular rate & rhythm and no murmurs and no lower extremity edema ABDOMEN: soft, non-tender, non-distended, normal bowel sounds Musculoskeletal: no cyanosis of digits and no clubbing  PSYCH: alert & oriented x 3, fluent speech NEURO: no focal motor/sensory deficits  LABORATORY DATA:  I have reviewed the data as listed CBC Latest Ref Rng & Units 01/06/2021 12/22/2020 12/14/2020  WBC 4.0 - 10.5 K/uL 8.6 8.4 8.2  Hemoglobin 12.0 - 15.0 g/dL 13.6 15.0 14.0  Hematocrit 36.0 - 46.0 % 41.3 45.4 42.4  Platelets 150 - 400 K/uL 224 257 181    CMP Latest Ref Rng & Units 01/06/2021 12/22/2020 12/13/2020  Glucose 70 - 99 mg/dL 96 95 95  BUN 6 - 20 mg/dL 17 20 15   Creatinine 0.44 - 1.00 mg/dL 0.75 0.94 0.74  Sodium 135 - 145 mmol/L 141 140 135  Potassium 3.5 - 5.1 mmol/L 4.1 4.3 3.6  Chloride 98 - 111 mmol/L 111 104 109  CO2 22 - 32 mmol/L 22 20 15(L)  Calcium 8.9 - 10.3 mg/dL 9.3 10.2 8.6(L)  Total Protein 6.5 - 8.1 g/dL 7.0 7.2 -  Total Bilirubin 0.3 - 1.2 mg/dL 0.3 <0.2 -  Alkaline Phos 38 - 126 U/L 43 48 -  AST 15 - 41 U/L 11(L) 11 -  ALT 0 - 44 U/L 17 25 -   RADIOGRAPHIC STUDIES: I have personally reviewed the radiological images as listed and agreed with the findings in the report. DG Chest 2 View  Result Date: 12/12/2020 CLINICAL DATA:  Shortness of breath EXAM: CHEST - 2 VIEW COMPARISON:  May 04, 2015 FINDINGS: Lungs are clear. Heart size and pulmonary vascularity are normal. No adenopathy. No bone lesions. IMPRESSION: Lungs clear.  Cardiac silhouette normal. Electronically Signed   By: Lowella Grip III M.D.   On: 12/12/2020 10:39  CT Angio Chest W/Cm &/Or Wo Cm  Addendum Date: 12/12/2020   ADDENDUM REPORT: 12/12/2020 14:49 ADDENDUM: These results were called by telephone at the time of interpretation on 12/12/2020 at 2:49 pm to provider [facility] , who verbally acknowledged these results. Electronically Signed   By: Jacqulynn Cadet M.D.   On: 12/12/2020 14:49   Result Date: 12/12/2020 CLINICAL DATA:  50 year old female with shortness of breath and tachycardia. Pulmonary embolism suspected. EXAM: CT ANGIOGRAPHY CHEST WITH CONTRAST TECHNIQUE: Multidetector CT imaging of the chest was performed using the standard protocol during bolus administration of intravenous contrast. Multiplanar CT image reconstructions and MIPs were obtained to evaluate the vascular anatomy. CONTRAST:  141mL OMNIPAQUE IOHEXOL 350 MG/ML SOLN COMPARISON:  None. FINDINGS: Cardiovascular: Bilateral pulmonary emboli including a saddle embolus. Clot burden on the right begins in the main pulmonary artery and extends into upper, middle and lower lobar pulmonary arteries. On the left, the thrombus also begins in the main pulmonary artery and extends into the upper and lower lobe pulmonary arteries. There is evidence of right heart strain within RV LV ratio of 1.81. The heart is self is normal in size. No pericardial effusion. Normal caliber main pulmonary artery. Mediastinum/Nodes: Unremarkable CT appearance of the thyroid gland. No suspicious mediastinal or hilar adenopathy. No soft tissue mediastinal mass. The thoracic esophagus is unremarkable. Lungs/Pleura: Lungs are clear. No pleural effusion or pneumothorax. Upper Abdomen: No acute abnormality. Nonobstructing left upper pole nephrolithiasis. The stone measures 0.6 cm. Musculoskeletal: No chest wall abnormality. No acute or significant osseous findings. Review of the MIP images confirms the above findings. IMPRESSION: 1. Large volume bilateral pulmonary emboli with  saddle embolus. Positive for acute PE with CT evidence of right heart strain (RV/LV Ratio = 1.81) consistent with at least submassive (intermediate risk) PE. The presence of right heart strain has been associated with an increased risk of morbidity and mortality. Please refer to the "PE Focused" order set in EPIC. 2. Nonobstructing left upper pole kidney stone. Electronically Signed: By: Jacqulynn Cadet M.D. On: 12/12/2020 14:44   ECHOCARDIOGRAM COMPLETE  Result Date: 12/13/2020    ECHOCARDIOGRAM REPORT   Patient Name:   SUN LALLO Date of Exam: 12/13/2020 Medical Rec #:  [id number]        Height:       65.0 in Accession #:    [id number]       Weight:       228.0 lb Date of Birth:  [birth year]        BSA:          2.091 m Patient Age:    21 years         BP:           145/90 mmHg Patient Gender: F                HR:           101 bpm. Exam Location:  Inpatient Procedure: 2D Echo, Cardiac Doppler, Color Doppler and Intracardiac            Opacification Agent Indications:    Elevated Troponin.  History:        Patient has no prior history of Echocardiogram examinations.                 Risk Factors:Hypertension. Chiari malformation.  Sonographer:    Tiffany Dance Referring Phys: [id number] [place]  1. Left ventricular ejection fraction, by estimation, is 65 to 70%. The  left ventricle has normal function. The left ventricle has no regional wall motion abnormalities. Left ventricular diastolic parameters were normal. There is the interventricular septum is flattened in diastole ('D' shaped left ventricle), consistent with right ventricular volume overload.  2. Right ventricular systolic function is normal. The right ventricular size is moderately enlarged. There is normal pulmonary artery systolic pressure.  3. The mitral valve is normal in structure. Trivial mitral valve regurgitation.  4. The aortic valve is normal in structure. Aortic valve regurgitation is not visualized. No aortic stenosis is  present. FINDINGS  Left Ventricle: Left ventricular ejection fraction, by estimation, is 65 to 70%. The left ventricle has normal function. The left ventricle has no regional wall motion abnormalities. Definity contrast agent was given IV to delineate the left ventricular  endocardial borders. The left ventricular internal cavity size was small. There is no left ventricular hypertrophy. The interventricular septum is flattened in diastole ('D' shaped left ventricle), consistent with right ventricular volume overload. Left  ventricular diastolic parameters were normal. Right Ventricle: The right ventricular size is moderately enlarged. Right vetricular wall thickness was not well visualized. Right ventricular systolic function is normal. There is normal pulmonary artery systolic pressure. The tricuspid regurgitant velocity is 2.43 m/s, and with an assumed right atrial pressure of 3 mmHg, the estimated right ventricular systolic pressure is 123XX123 mmHg. Left Atrium: Left atrial size was normal in size. Right Atrium: Right atrial size was normal in size. Pericardium: There is no evidence of pericardial effusion. Mitral Valve: The mitral valve is normal in structure. Trivial mitral valve regurgitation. Tricuspid Valve: The tricuspid valve is grossly normal. Tricuspid valve regurgitation is mild. Aortic Valve: The aortic valve is normal in structure. Aortic valve regurgitation is not visualized. No aortic stenosis is present. Pulmonic Valve: The pulmonic valve was normal in structure. Pulmonic valve regurgitation is trivial. Aorta: The aortic root and ascending aorta are structurally normal, with no evidence of dilitation. IAS/Shunts: The atrial septum is grossly normal.  LEFT VENTRICLE PLAX 2D LVIDd:         3.30 cm  Diastology LVIDs:         2.10 cm  LV e' medial:    9.03 cm/s LV PW:         1.00 cm  LV E/e' medial:  5.4 LV IVS:        1.00 cm  LV e' lateral:   13.90 cm/s LVOT diam:     1.50 cm  LV E/e' lateral: 3.5 LV  SV:         28 LV SV Index:   14 LVOT Area:     1.77 cm  RIGHT VENTRICLE            IVC RV Basal diam:  2.90 cm    IVC diam: 1.80 cm RV S prime:     9.36 cm/s TAPSE (M-mode): 1.9 cm LEFT ATRIUM             Index       RIGHT ATRIUM           Index LA diam:        3.30 cm 1.58 cm/m  RA Area:     13.90 cm LA Vol (A2C):   34.3 ml 16.40 ml/m RA Volume:   34.50 ml  16.50 ml/m LA Vol (A4C):   19.6 ml 9.37 ml/m LA Biplane Vol: 27.9 ml 13.34 ml/m  AORTIC VALVE LVOT Vmax:   93.30 cm/s LVOT Vmean:  55.500 cm/s LVOT  VTI:    0.161 m  AORTA Ao Root diam: 2.70 cm Ao Asc diam:  2.80 cm MITRAL VALVE               TRICUSPID VALVE MV Area (PHT): 1.94 cm    TR Peak grad:   23.6 mmHg MV Decel Time: 391 msec    TR Vmax:        243.00 cm/s MV E velocity: 48.70 cm/s MV A velocity: 58.60 cm/s  SHUNTS MV E/A ratio:  0.83        Systemic VTI:  0.16 m                            Systemic Diam: 1.50 cm Mertie Moores MD Electronically signed by Mertie Moores MD Signature Date/Time: 12/13/2020/12:34:22 PM    Final    VAS [name] LOWER EXTREMITY VENOUS (DVT)  Result Date: 12/13/2020  Lower Venous DVT Study Indications: Pulmonary embolism.  Risk Factors: None identified. Limitations: Body habitus and poor ultrasound/tissue interface. Comparison Study: No prior studies. Performing Technologist: Oliver Hum RVT  Examination Guidelines: A complete evaluation includes B-mode imaging, spectral Doppler, color Doppler, and power Doppler as needed of all accessible portions of each vessel. Bilateral testing is considered an integral part of a complete examination. Limited examinations for reoccurring indications may be performed as noted. The reflux portion of the exam is performed with the patient in reverse Trendelenburg.  +---------+---------------+---------+-----------+----------+--------------+ RIGHT    CompressibilityPhasicitySpontaneityPropertiesThrombus Aging  +---------+---------------+---------+-----------+----------+--------------+ CFV      Full           Yes      Yes                                 +---------+---------------+---------+-----------+----------+--------------+ SFJ      Full                                                        +---------+---------------+---------+-----------+----------+--------------+ FV Prox  Full                                                        +---------+---------------+---------+-----------+----------+--------------+ FV Mid   Full                                                        +---------+---------------+---------+-----------+----------+--------------+ FV DistalFull                                                        +---------+---------------+---------+-----------+----------+--------------+ PFV      Full                                                        +---------+---------------+---------+-----------+----------+--------------+  POP      Partial        Yes      Yes                  Acute          +---------+---------------+---------+-----------+----------+--------------+ PTV      Full                                                        +---------+---------------+---------+-----------+----------+--------------+ PERO     Full                                                        +---------+---------------+---------+-----------+----------+--------------+ Gastroc  Full                                                        +---------+---------------+---------+-----------+----------+--------------+   +---------+---------------+---------+-----------+----------+--------------+ LEFT     CompressibilityPhasicitySpontaneityPropertiesThrombus Aging +---------+---------------+---------+-----------+----------+--------------+ CFV      Full           Yes      Yes                                  +---------+---------------+---------+-----------+----------+--------------+ SFJ      Full                                                        +---------+---------------+---------+-----------+----------+--------------+ FV Prox  Full                                                        +---------+---------------+---------+-----------+----------+--------------+ FV Mid   Full                                                        +---------+---------------+---------+-----------+----------+--------------+ FV DistalFull                                                        +---------+---------------+---------+-----------+----------+--------------+ PFV      Full                                                        +---------+---------------+---------+-----------+----------+--------------+  POP      Full           Yes      Yes                                 +---------+---------------+---------+-----------+----------+--------------+ PTV      Full                                                        +---------+---------------+---------+-----------+----------+--------------+ PERO     Full                                                        +---------+---------------+---------+-----------+----------+--------------+     Summary: RIGHT: - Findings consistent with acute deep vein thrombosis involving the right popliteal vein. - No cystic structure found in the popliteal fossa.  LEFT: - There is no evidence of deep vein thrombosis in the lower extremity. However, portions of this examination were limited- see technologist comments above.  - No cystic structure found in the popliteal fossa.  *See table(s) above for measurements and observations. Electronically signed by Waverly Ferrari MD on 12/13/2020 at 1:49:26 PM.    Final     ASSESSMENT & PLAN MYCALA ORFIELD is a 50 y.o. female presenting to the clinic for evaluation for anticoagulation after recent  acute bilateral pulmonary emboli with saddle embolism. I reviewed provoking factors for venous thrombosis including recent surgery, infection, trauma, immobility, pregnancy and hormone therapy. Patient denies any prior history of DVT/PEs and denies any provoking factors. The recommendation would be indefinite anticoagulation due to unprovoking nature of the thrombotic event.   # Bilateral pulmonary emboli with saddle embolism and RLE DVT: --Recommend indefinite anticoagulation due to unprovoked nature. --Currently on Eliquis and recommend to continue.  --Labs today to check CBC, CMP and antiphospholipid antibody panel (lupus anticoagulant, beta-2 glycoprotein antibodies, cardiolipin antibodies).  --Telehealth visit in 3 months to monitor toleration of Eliquis. --Patient will return to the clinic in 6 months with repeat labs.   Orders Placed This Encounter  Procedures  . CBC with Differential (Cancer Center Only)    Standing Status:   Future    Number of Occurrences:   1    Standing Expiration Date:   01/06/2022  . CMP (Cancer Center only)    Standing Status:   Future    Number of Occurrences:   1    Standing Expiration Date:   01/06/2022  . Lupus anticoagulant panel*    Standing Status:   Future    Number of Occurrences:   1    Standing Expiration Date:   01/06/2022  . Cardiolipin antibodies, IgG, IgM, IgA*    Standing Status:   Future    Number of Occurrences:   1    Standing Expiration Date:   01/06/2022  . Beta-2-glycoprotein i abs, IgG/M/A    Standing Status:   Future    Number of Occurrences:   1    Standing Expiration Date:   01/06/2022    All questions were answered. The patient knows to call the clinic with any problems, questions  or concerns.  A total of more than 60 minutes were spent on this encounter and over half of that time was spent on counseling and coordination of care as outlined above.    Dede Query, PA-C Department of Hematology/Oncology [place]  at [facility] Phone: [phone]  Patient was seen with Dr. Lorenso Courier.   I have read the above note and personally examined the patient. I agree with the assessment and plan as noted above.  Briefly Mrs. Fort is a 50 year old Caucasian female with medical history significant for an unprovoked saddle pulmonary embolus.  Etiology of her thrombosis is unclear.  She is currently on Eliquis 5 mg p.o. twice daily I recommend continuation of this therapy for at least 6 months time.  Given the unprovoked nature of this clot I would recommend indefinite anticoagulation.  We can discuss maintenance therapy in approximately 6 months time.  Would recommend interval visit in 3 months in order to assure that she is tolerating anticoagulation.   Ledell Peoples, MD Department of Hematology/Oncology [place] at [facility] Phone: [phone] Pager: [phone] Email: [email or web address]

## 2021-01-07 LAB — LUPUS ANTICOAGULANT PANEL
DRVVT: 48.6 s — ABNORMAL HIGH (ref 0.0–47.0)
PTT Lupus Anticoagulant: 30.2 s (ref 0.0–51.9)

## 2021-01-07 LAB — DRVVT MIX: dRVVT Mix: 41.1 s — ABNORMAL HIGH (ref 0.0–40.4)

## 2021-01-07 LAB — DRVVT CONFIRM: dRVVT Confirm: 1.2 ratio (ref 0.8–1.2)

## 2021-01-09 LAB — BETA-2-GLYCOPROTEIN I ABS, IGG/M/A
Beta-2 Glyco I IgG: <9 GPI IgG units (ref 0–20)
Beta-2-Glycoprotein I IgA: <9 GPI IgA units (ref 0–25)
Beta-2-Glycoprotein I IgM: <9 GPI IgM units (ref 0–32)

## 2021-01-10 LAB — CARDIOLIPIN ANTIBODIES, IGG, IGM, IGA
Anticardiolipin IgA: <9 APL U/mL (ref 0–11)
Anticardiolipin IgG: <9 GPL U/mL (ref 0–14)
Anticardiolipin IgM: 16 MPL U/mL — ABNORMAL HIGH (ref 0–12)

## 2021-01-11 ENCOUNTER — Other Ambulatory Visit (HOSPITAL_COMMUNITY): Payer: Self-pay

## 2021-01-11 MED FILL — Lisinopril & Hydrochlorothiazide Tab 20-12.5 MG: ORAL | 90 days supply | Qty: 90 | Fill #0 | Status: AC

## 2021-01-12 ENCOUNTER — Telehealth: Payer: Self-pay | Admitting: Physician Assistant

## 2021-01-12 NOTE — Telephone Encounter (Signed)
I called [name] to confirm that labs from 01/06/2021 did not indicate antiphospholipid antibody syndrome. Recommend to continue with current anticoagulation with Eliquis. Patient will return to the clinic in 6 months with labs.   Patient expressed understanding and satisfaction with the plan provided.

## 2021-01-23 ENCOUNTER — Ambulatory Visit (INDEPENDENT_AMBULATORY_CARE_PROVIDER_SITE_OTHER): Payer: 59 | Admitting: Family Medicine

## 2021-01-23 ENCOUNTER — Encounter (INDEPENDENT_AMBULATORY_CARE_PROVIDER_SITE_OTHER): Payer: Self-pay | Admitting: Family Medicine

## 2021-01-23 ENCOUNTER — Other Ambulatory Visit: Payer: Self-pay

## 2021-01-23 VITALS — BP 133/79 | HR 85 | Temp 97.9°F | Ht 65.0 in | Wt 239.0 lb

## 2021-01-23 DIAGNOSIS — Z86711 Personal history of pulmonary embolism: Secondary | ICD-10-CM | POA: Diagnosis not present

## 2021-01-23 DIAGNOSIS — Z6839 Body mass index (BMI) 39.0-39.9, adult: Secondary | ICD-10-CM

## 2021-01-23 DIAGNOSIS — R5383 Other fatigue: Secondary | ICD-10-CM

## 2021-01-24 ENCOUNTER — Encounter (INDEPENDENT_AMBULATORY_CARE_PROVIDER_SITE_OTHER): Payer: Self-pay | Admitting: Family Medicine

## 2021-01-24 NOTE — Progress Notes (Signed)
Chief Complaint:   OBESITY [name] is here to discuss her progress with her obesity treatment plan along with follow-up of her obesity related diagnoses. [name] is on the Category 3 Plan and states she is following her eating plan approximately 85% of the time. [name] states she is doing 0 minutes 0 times per week.  Today's visit was #: 75 Starting weight: 250 lbs Starting date: 09/08/2018 Today's weight: 239 lbs Today's date: 01/23/2021 Total lbs lost to date: 11 Total lbs lost since last in-office visit: 0  Interim History: [name] had a recent saddle PE on 12/12/2020. Hematology has decided it was unprovoked and she will need to be anticoagulated indefinitely. She is on Saxenda 1.2 mg daily. She notes cravings more than hunger due to being off work for PE. She denies constipation or nausea.  Subjective:   1. Hx of PE [name] notes fatigue since PE. She is on Eliquis, and she was told it would take a while to build up endurance. Her primary care provider is managing her Eliquis. She notes increased edema recently in lower extremities.  Assessment/Plan:   1. Hx of PE [name] will continue to follow up with Hematology and her primary care provider. She has an echocardiogram scheduled for 03/14/2021.  2. Obesity with current BMI 39.77 [name] is currently in the action stage of change. As such, her goal is to continue with weight loss efforts. She has agreed to the Category 3 Plan.   We discussed various medication options to help [name] with her weight loss efforts and we both agreed to increase dose of Saxenda to 1.8 mg.  Exercise goals: No exercise has been prescribed at this time.  Behavioral modification strategies: increasing lean protein intake and meal planning and cooking strategies.  [name] has agreed to follow-up with our clinic in 3 weeks.   Objective:   Blood pressure 133/79, pulse 85, temperature 97.9 F (36.6 C), temperature source Oral, height 5\' 5"  (1.651 m), weight 239 lb (108.4  kg), SpO2 98 %. Body mass index is 39.77 kg/m.  General: Cooperative, alert, well developed, in no acute distress. HEENT: Conjunctivae and lids unremarkable. Cardiovascular: Regular rhythm.  Lungs: Normal work of breathing. Neurologic: No focal deficits.   Lab Results  Component Value Date   CREATININE 0.75 01/06/2021   BUN 17 01/06/2021   NA 141 01/06/2021   K 4.1 01/06/2021   CL 111 01/06/2021   CO2 22 01/06/2021   Lab Results  Component Value Date   ALT 17 01/06/2021   AST 11 (L) 01/06/2021   ALKPHOS 43 01/06/2021   BILITOT 0.3 01/06/2021   Lab Results  Component Value Date   HGBA1C 5.0 06/13/2020   HGBA1C 5.1 12/21/2019   HGBA1C 5.1 05/04/2019   HGBA1C 5.1 01/29/2019   HGBA1C 5.2 09/08/2018   Lab Results  Component Value Date   INSULIN 18.2 06/13/2020   INSULIN 22.1 12/21/2019   INSULIN 13.2 05/04/2019   INSULIN 16.1 01/29/2019   INSULIN 16.9 09/08/2018   Lab Results  Component Value Date   TSH 2.810 09/15/2020   Lab Results  Component Value Date   CHOL 177 09/15/2020   HDL 58 09/15/2020   LDLCALC 105 (H) 09/15/2020   TRIG 72 09/15/2020   CHOLHDL 3.1 09/15/2020   Lab Results  Component Value Date   WBC 8.6 01/06/2021   HGB 13.6 01/06/2021   HCT 41.3 01/06/2021   MCV 93.9 01/06/2021   PLT 224 01/06/2021   No results found  for: IRON, TIBC, FERRITIN  Attestation Statements:   Reviewed by clinician on day of visit: allergies, medications, problem list, medical history, surgical history, family history, social history, and previous encounter notes.   Wilhemena Durie, am acting as Location manager for [employer], FNP-C.  I have reviewed the above documentation for accuracy and completeness, and I agree with the above. -  Georgianne Fick, FNP

## 2021-01-27 ENCOUNTER — Encounter: Payer: 59 | Admitting: Gastroenterology

## 2021-02-04 ENCOUNTER — Other Ambulatory Visit (HOSPITAL_COMMUNITY): Payer: Self-pay

## 2021-02-07 ENCOUNTER — Other Ambulatory Visit (HOSPITAL_COMMUNITY): Payer: Self-pay

## 2021-02-07 ENCOUNTER — Other Ambulatory Visit: Payer: Self-pay | Admitting: Family

## 2021-02-07 MED ORDER — ELIQUIS 5 MG PO TABS
5.0000 mg | ORAL_TABLET | Freq: Two times a day (BID) | ORAL | 1 refills | Status: DC
  Filled 2021-02-07: qty 180, 76d supply, fill #0
  Filled 2021-05-02: qty 180, 76d supply, fill #1

## 2021-02-08 ENCOUNTER — Other Ambulatory Visit (HOSPITAL_COMMUNITY): Payer: Self-pay

## 2021-02-13 ENCOUNTER — Other Ambulatory Visit: Payer: Self-pay

## 2021-02-13 ENCOUNTER — Ambulatory Visit (INDEPENDENT_AMBULATORY_CARE_PROVIDER_SITE_OTHER): Payer: 59 | Admitting: Adult Health

## 2021-02-13 ENCOUNTER — Encounter (INDEPENDENT_AMBULATORY_CARE_PROVIDER_SITE_OTHER): Payer: Self-pay | Admitting: Adult Health

## 2021-02-13 ENCOUNTER — Other Ambulatory Visit (HOSPITAL_COMMUNITY): Payer: Self-pay

## 2021-02-13 VITALS — BP 138/84 | HR 85 | Temp 98.0°F | Ht 65.0 in | Wt 243.0 lb

## 2021-02-13 DIAGNOSIS — E876 Hypokalemia: Secondary | ICD-10-CM

## 2021-02-13 DIAGNOSIS — Z9189 Other specified personal risk factors, not elsewhere classified: Secondary | ICD-10-CM | POA: Diagnosis not present

## 2021-02-13 DIAGNOSIS — I10 Essential (primary) hypertension: Secondary | ICD-10-CM

## 2021-02-13 DIAGNOSIS — Z6839 Body mass index (BMI) 39.0-39.9, adult: Secondary | ICD-10-CM | POA: Diagnosis not present

## 2021-02-13 MED ORDER — SAXENDA 18 MG/3ML ~~LOC~~ SOPN
3.0000 mg | PEN_INJECTOR | Freq: Every day | SUBCUTANEOUS | 0 refills | Status: DC
  Filled 2021-02-13 – 2021-02-24 (×2): qty 15, 30d supply, fill #0

## 2021-02-14 ENCOUNTER — Other Ambulatory Visit (HOSPITAL_COMMUNITY): Payer: Self-pay

## 2021-02-15 NOTE — Progress Notes (Signed)
Chief Complaint:   OBESITY [name] is here to discuss her progress with her obesity treatment plan along with follow-up of her obesity related diagnoses. [name] is on the Category 3 Plan and states she is following her eating plan approximately 85% of the time. [name] states she is cardio and planks 25 minutes 2 times per week.  Today's visit was #: 94 Starting weight: 250 lbs Starting date: 09/08/2018 Today's weight: 243 lbs Today's date: 02/13/2021 Total lbs lost to date: 7 Total lbs lost since last in-office visit: 0  Interim History: Due to staffing shortage, [name] has been working 50-60 hours per week. She will take snacks and 1 meal to work on days she is at [employer].  [name] increased to 1.8 mg at last OV 01/23/2021. [name] reports significant decrease in appetite and been difficult to consume all foods on plan.  Subjective:   1. Essential hypertension BP/HR at goal at OV.  [name] is on lisinopril/HCTZ 20-12.5 mg QD.  BP Readings from Last 3 Encounters:  02/13/21 138/84  01/23/21 133/79  01/06/21 125/69   2. Hypokalemia [name] denies CP/palpitations. 01/06/2021 CMP- electrolytes stable.  3. At risk for side effect of medication [name] is at risk for side effects of medication due to poor appetite due to recent increase of injectable GLP-1.  Assessment/Plan:   1. Essential hypertension [name] is working on healthy weight loss and exercise to improve blood pressure control. We will watch for signs of hypotension as she continues her lifestyle modifications. -Continue lisinopril/HCTZ 20-12.5 mg QD.  2. Hypokalemia Continue potassium replacement as directed.  3. At risk for side effect of medication [name] was given approximately 15 minutes of drug side effect counseling today.  We discussed side effect possibility and risk versus benefits. [name] agreed to the medication and will contact this office if these side effects are intolerable.  [name] was employed  today to elicit superior memory formation and behavioral change.   4. Obesity with current BMI 40.5  [name] is currently in the action stage of change. As such, her goal is to continue with weight loss efforts. She has agreed to the Category 3 Plan.   We discussed various medication options to help [name] with her weight loss efforts and we both agreed to continue [name], as prescribed below.  Decrease [name] to 1.2mg  QD- due to decreased appetite with higher dose.  - [name] -Weight Management ([name]) 18 MG/3ML SOPN; Inject 3 mg into the skin daily.  Dispense: 15 mL; Refill: 0  Exercise goals:  As is  Behavioral modification strategies: increasing lean protein intake, decreasing simple carbohydrates, meal planning and cooking strategies, keeping healthy foods in the home, and planning for success.  [name] has agreed to follow-up with our clinic in 3 weeks. She was informed of the importance of frequent follow-up visits to maximize her success with intensive lifestyle modifications for her multiple health conditions.   Objective:   Blood pressure 138/84, pulse 85, temperature 98 F (36.7 C), height 5\' 5"  (1.651 m), weight 243 lb (110.2 kg), SpO2 99 %. Body mass index is 40.44 kg/m.  General: Cooperative, alert, well developed, in no acute distress. HEENT: Conjunctivae and lids unremarkable. Cardiovascular: Regular rhythm.  Lungs: Normal work of breathing. Neurologic: No focal deficits.   Lab Results  Component Value Date   CREATININE 0.75 01/06/2021   BUN 17 01/06/2021   NA 141 01/06/2021   K 4.1 01/06/2021   CL 111 01/06/2021   CO2 22  01/06/2021   Lab Results  Component Value Date   ALT 17 01/06/2021   AST 11 (L) 01/06/2021   ALKPHOS 43 01/06/2021   BILITOT 0.3 01/06/2021   Lab Results  Component Value Date   HGBA1C 5.0 06/13/2020   HGBA1C 5.1 12/21/2019   HGBA1C 5.1 05/04/2019   HGBA1C 5.1 01/29/2019   HGBA1C 5.2 09/08/2018   Lab Results  Component Value  Date   INSULIN 18.2 06/13/2020   INSULIN 22.1 12/21/2019   INSULIN 13.2 05/04/2019   INSULIN 16.1 01/29/2019   INSULIN 16.9 09/08/2018   Lab Results  Component Value Date   TSH 2.810 09/15/2020   Lab Results  Component Value Date   CHOL 177 09/15/2020   HDL 58 09/15/2020   LDLCALC 105 (H) 09/15/2020   TRIG 72 09/15/2020   CHOLHDL 3.1 09/15/2020   Lab Results  Component Value Date   WBC 8.6 01/06/2021   HGB 13.6 01/06/2021   HCT 41.3 01/06/2021   MCV 93.9 01/06/2021   PLT 224 01/06/2021   No results found for: IRON, TIBC, FERRITIN   Attestation Statements:   Reviewed by clinician on day of visit: allergies, medications, problem list, medical history, surgical history, family history, social history, and previous encounter notes.  Coral Ceo, CMA, am acting as transcriptionist for Mina Marble, NP.  I have reviewed the above documentation for accuracy and completeness, and I agree with the above. -  Asante Ritacco d. Eulamae Greenstein, NP-C

## 2021-02-23 ENCOUNTER — Other Ambulatory Visit (HOSPITAL_COMMUNITY): Payer: Self-pay

## 2021-02-23 MED FILL — Acetazolamide Tab 250 MG: ORAL | 90 days supply | Qty: 180 | Fill #0 | Status: AC

## 2021-02-24 ENCOUNTER — Other Ambulatory Visit (HOSPITAL_COMMUNITY): Payer: Self-pay

## 2021-03-01 ENCOUNTER — Ambulatory Visit (INDEPENDENT_AMBULATORY_CARE_PROVIDER_SITE_OTHER): Payer: 59 | Admitting: Family

## 2021-03-01 ENCOUNTER — Encounter: Payer: Self-pay | Admitting: Family

## 2021-03-01 DIAGNOSIS — R319 Hematuria, unspecified: Secondary | ICD-10-CM

## 2021-03-01 LAB — URINALYSIS
Bilirubin, UA: NEGATIVE
Glucose, UA: NEGATIVE
Ketones, UA: NEGATIVE
Leukocytes,UA: NEGATIVE
Nitrite, UA: NEGATIVE
Protein,UA: NEGATIVE
Specific Gravity, UA: 1.025 (ref 1.005–1.030)
Urobilinogen, Ur: 0.2 mg/dL (ref 0.2–1.0)
pH, UA: 6.5 (ref 5.0–7.5)

## 2021-03-01 NOTE — Progress Notes (Signed)
   Virtual Visit  Note Due to COVID-19 pandemic this visit was conducted virtually. This visit type was conducted due to national recommendations for restrictions regarding the COVID-19 Pandemic (e.g. social distancing, sheltering in place) in an effort to limit this patient's exposure and mitigate transmission in our community. All issues noted in this document were discussed and addressed.  A physical exam was not performed with this format.  I connected with [name] on 03/01/21 at 9:35 AM by telephone and verified that I am speaking with the correct person using two identifiers. [name] is currently located at car and no one is currently with her during visit. The provider, [name], FNP is located in their office at time of visit.  I discussed the limitations, risks, security and privacy concerns of performing an evaluation and management service by telephone and the availability of in person appointments. I also discussed with the patient that there may be a patient responsible charge related to this service. The patient expressed understanding and agreed to proceed.   History and Present Illness:  Hematuria This is a new problem. The current episode started in the past 7 days. The problem has been resolved since onset. She is experiencing no pain. She describes her urine color as light pink. Irritative symptoms do not include frequency or urgency. Pertinent negatives include no nausea or vomiting. (Left flank pain) Her past medical history is significant for kidney stones.     Review of Systems  Gastrointestinal:  Negative for nausea and vomiting.  Genitourinary:  Positive for hematuria. Negative for frequency and urgency.    Observations/Objective: No SOB or distress noted   Assessment and Plan: 1. Hematuria, unspecified type Resolved Will rule out infection Will give her a urine filter to use to rule out stone.  - Urinalysis - Urine Culture     I  discussed the assessment and treatment plan with the patient. The patient was provided an opportunity to ask questions and all were answered. The patient agreed with the plan and demonstrated an understanding of the instructions.   The patient was advised to call back or seek an in-person evaluation if the symptoms worsen or if the condition fails to improve as anticipated.  The above assessment and management plan was discussed with the patient. The patient verbalized understanding of and has agreed to the management plan. Patient is aware to call the clinic if symptoms persist or worsen. Patient is aware when to return to the clinic for a follow-up visit. Patient educated on when it is appropriate to go to the emergency department.   Time call ended: 9:47 AM    I provided 12 minutes of  non face-to-face time during this encounter.    [name], FNP

## 2021-03-01 NOTE — Addendum Note (Signed)
Addended by: Ladean Raya on: 03/01/2021 01:22 PM   Modules accepted: Orders

## 2021-03-02 ENCOUNTER — Encounter (INDEPENDENT_AMBULATORY_CARE_PROVIDER_SITE_OTHER): Payer: Self-pay | Admitting: Adult Health

## 2021-03-02 ENCOUNTER — Other Ambulatory Visit (HOSPITAL_COMMUNITY): Payer: Self-pay

## 2021-03-02 ENCOUNTER — Ambulatory Visit (INDEPENDENT_AMBULATORY_CARE_PROVIDER_SITE_OTHER): Payer: 59 | Admitting: Adult Health

## 2021-03-02 ENCOUNTER — Other Ambulatory Visit: Payer: Self-pay

## 2021-03-02 VITALS — BP 116/75 | HR 84 | Temp 98.1°F | Ht 65.0 in | Wt 244.0 lb

## 2021-03-02 DIAGNOSIS — Z9189 Other specified personal risk factors, not elsewhere classified: Secondary | ICD-10-CM | POA: Diagnosis not present

## 2021-03-02 DIAGNOSIS — Z6839 Body mass index (BMI) 39.0-39.9, adult: Secondary | ICD-10-CM | POA: Diagnosis not present

## 2021-03-02 DIAGNOSIS — J301 Allergic rhinitis due to pollen: Secondary | ICD-10-CM | POA: Diagnosis not present

## 2021-03-02 DIAGNOSIS — I10 Essential (primary) hypertension: Secondary | ICD-10-CM

## 2021-03-02 MED ORDER — UNIFINE PENTIPS 32G X 4 MM MISC
0 refills | Status: DC
  Filled 2021-03-02: qty 100, 90d supply, fill #0

## 2021-03-03 LAB — URINE CULTURE

## 2021-03-04 ENCOUNTER — Emergency Department (HOSPITAL_COMMUNITY)
Admission: EM | Admit: 2021-03-04 | Discharge: 2021-03-04 | Disposition: A | Discharge status: Home / Self Care | Payer: 59 | Attending: Emergency Medicine | Admitting: Emergency Medicine

## 2021-03-04 ENCOUNTER — Emergency Department (HOSPITAL_COMMUNITY): Payer: 59

## 2021-03-04 ENCOUNTER — Other Ambulatory Visit: Payer: Self-pay

## 2021-03-04 ENCOUNTER — Encounter (HOSPITAL_COMMUNITY): Payer: Self-pay

## 2021-03-04 DIAGNOSIS — Z7901 Long term (current) use of anticoagulants: Secondary | ICD-10-CM | POA: Diagnosis not present

## 2021-03-04 DIAGNOSIS — Z79899 Other long term (current) drug therapy: Secondary | ICD-10-CM | POA: Diagnosis not present

## 2021-03-04 DIAGNOSIS — J45909 Unspecified asthma, uncomplicated: Secondary | ICD-10-CM | POA: Insufficient documentation

## 2021-03-04 DIAGNOSIS — I700 Atherosclerosis of aorta: Secondary | ICD-10-CM | POA: Diagnosis not present

## 2021-03-04 DIAGNOSIS — M47817 Spondylosis without myelopathy or radiculopathy, lumbosacral region: Secondary | ICD-10-CM | POA: Diagnosis not present

## 2021-03-04 DIAGNOSIS — I10 Essential (primary) hypertension: Secondary | ICD-10-CM | POA: Insufficient documentation

## 2021-03-04 DIAGNOSIS — N202 Calculus of kidney with calculus of ureter: Secondary | ICD-10-CM | POA: Diagnosis not present

## 2021-03-04 DIAGNOSIS — N132 Hydronephrosis with renal and ureteral calculous obstruction: Secondary | ICD-10-CM | POA: Diagnosis not present

## 2021-03-04 DIAGNOSIS — M5127 Other intervertebral disc displacement, lumbosacral region: Secondary | ICD-10-CM | POA: Diagnosis not present

## 2021-03-04 DIAGNOSIS — N200 Calculus of kidney: Secondary | ICD-10-CM

## 2021-03-04 DIAGNOSIS — R112 Nausea with vomiting, unspecified: Secondary | ICD-10-CM | POA: Diagnosis present

## 2021-03-04 DIAGNOSIS — N201 Calculus of ureter: Secondary | ICD-10-CM | POA: Diagnosis not present

## 2021-03-04 LAB — URINALYSIS, ROUTINE W REFLEX MICROSCOPIC
Bilirubin Urine: NEGATIVE
Glucose, UA: NEGATIVE mg/dL
Ketones, ur: NEGATIVE mg/dL
Nitrite: NEGATIVE
Protein, ur: NEGATIVE mg/dL
RBC / HPF: >50 RBC/hpf — ABNORMAL HIGH (ref 0–5)
Specific Gravity, Urine: 1.02 (ref 1.005–1.030)
pH: 5 (ref 5.0–8.0)

## 2021-03-04 LAB — BASIC METABOLIC PANEL
Anion gap: 11 (ref 5–15)
BUN: 27 mg/dL — ABNORMAL HIGH (ref 6–20)
CO2: 23 mmol/L (ref 22–32)
Calcium: 10.2 mg/dL (ref 8.9–10.3)
Chloride: 107 mmol/L (ref 98–111)
Creatinine, Ser: 1.13 mg/dL — ABNORMAL HIGH (ref 0.44–1.00)
GFR, Estimated: >60 mL/min (ref >60)
Glucose, Bld: 102 mg/dL — ABNORMAL HIGH (ref 70–99)
Potassium: 3.4 mmol/L — ABNORMAL LOW (ref 3.5–5.1)
Sodium: 141 mmol/L (ref 135–145)

## 2021-03-04 LAB — CBC WITH DIFFERENTIAL/PLATELET
Abs Immature Granulocytes: 0.02 10*3/uL (ref 0.00–0.07)
Basophils Absolute: 0.1 10*3/uL (ref 0.0–0.1)
Basophils Relative: 1 %
Eosinophils Absolute: 0.2 10*3/uL (ref 0.0–0.5)
Eosinophils Relative: 2 %
HCT: 45.8 % (ref 36.0–46.0)
Hemoglobin: 14.9 g/dL (ref 12.0–15.0)
Immature Granulocytes: 0 %
Lymphocytes Relative: 25 %
Lymphs Abs: 2.5 10*3/uL (ref 0.7–4.0)
MCH: 30.3 pg (ref 26.0–34.0)
MCHC: 32.5 g/dL (ref 30.0–36.0)
MCV: 93.1 fL (ref 80.0–100.0)
Monocytes Absolute: 0.7 10*3/uL (ref 0.1–1.0)
Monocytes Relative: 7 %
Neutro Abs: 6.8 10*3/uL (ref 1.7–7.7)
Neutrophils Relative %: 65 %
Platelets: 285 10*3/uL (ref 150–400)
RBC: 4.92 MIL/uL (ref 3.87–5.11)
RDW: 13 % (ref 11.5–15.5)
WBC: 10.3 10*3/uL (ref 4.0–10.5)
nRBC: 0 % (ref 0.0–0.2)

## 2021-03-04 MED ORDER — HYDROCODONE-ACETAMINOPHEN 5-325 MG PO TABS
2.0000 | ORAL_TABLET | Freq: Once | ORAL | Status: AC
  Administered 2021-03-04: 2 via ORAL
  Filled 2021-03-04: qty 2

## 2021-03-04 MED ORDER — ONDANSETRON HCL 4 MG/2ML IJ SOLN
4.0000 mg | Freq: Once | INTRAMUSCULAR | Status: AC
  Administered 2021-03-04: 4 mg via INTRAVENOUS
  Filled 2021-03-04: qty 2

## 2021-03-04 MED ORDER — TAMSULOSIN HCL 0.4 MG PO CAPS: 0.4000 mg | ORAL_CAPSULE | Freq: Every day | ORAL | 0 refills | Status: DC

## 2021-03-04 MED ORDER — HYDROCODONE-ACETAMINOPHEN 5-325 MG PO TABS: 2.0000 | ORAL_TABLET | ORAL | 0 refills | Status: DC | PRN

## 2021-03-04 MED ORDER — MORPHINE SULFATE (PF) 4 MG/ML IV SOLN
4.0000 mg | Freq: Once | INTRAVENOUS | Status: AC
  Administered 2021-03-04: 4 mg via INTRAVENOUS
  Filled 2021-03-04: qty 1

## 2021-03-04 MED ORDER — ONDANSETRON 4 MG PO TBDP: 4.0000 mg | ORAL_TABLET | Freq: Three times a day (TID) | ORAL | 0 refills | Status: DC | PRN

## 2021-03-04 NOTE — Discharge Instructions (Addendum)
  Kidney Stone There is evidence of a kidney stone on the left side. Some kidney stones can take up to 30 days to pass. Hydration: Hydration is key to helping a kidney stone pass.  Have a goal of half a liter of water every hour or two. Acetaminophen: May take acetaminophen (generic for Tylenol), as needed, for pain. Your daily total maximum amount of acetaminophen from all sources should be limited to 4000mg /day for persons without liver problems, or 2000mg /day for those with liver problems. Vicodin: May take Vicodin (hydrocodone-acetaminophen) as needed for severe pain.   Do not drive or perform other dangerous activities while taking this medication as it can cause drowsiness as well as changes in reaction time and judgement.   Please note that each pill of Vicodin contains 325 mg of acetaminophen (generic for Tylenol) and the above dosage limits apply. Tamsulosin: This medication is designed to help the stone pass.  Take this medication daily until stone passes. Nausea/vomiting: Use the ondansetron (generic for Zofran) for nausea or vomiting.  This medication may not prevent all vomiting or nausea, but can help facilitate better hydration. Things that can help with nausea/vomiting also include peppermint/menthol candies, vitamin B12, and ginger. Follow-up: Follow-up with the urologist as soon as possible on this matter. Return: Return to the ED for significantly increased pain, difficulty urinating, pain with urination, fever, uncontrolled vomiting, or any other major concerns.  For prescription assistance, may try using prescription discount sites or apps, such as goodrx.com or Good Rx smart phone app.

## 2021-03-04 NOTE — ED Provider Notes (Signed)
Emergency Medicine Provider Triage Evaluation Note  [name] , a 50 y.o. female  was evaluated in triage.  Pt complains of left flank pain.  Patient reports Monday she had a slight twinge in her back and then noticed some hematuria on 6/29, saw her PCP and they noted a small amount of blood in her urine but at that time she was not having any pain.  Today at 4:30pm she had sudden onset of severe left flank pain that radiates into the groin.  Pain is constant but intermittently become sharper and more severe.  She has not had a prior history of kidney stones but back in April she had a CT scan and was diagnosed with PE and they noted a left 6 mm intrarenal stone at that time.  Review of Systems  Positive: Flank pain, hematuria Negative: Fevers, vomiting, dysuria  Physical Exam  BP (!) 148/88 (BP Location: Left Arm)   Pulse 85   Temp 98.3 F (36.8 C) (Oral)   Resp 20   SpO2 98%  Gen:   Awake, appears uncomfortable, pacing in room Resp:  Normal effort  MSK:   Moves extremities without difficulty  Other:  Abdomen soft, nondistended, nontender in all quadrants, no significant CVA tenderness bilaterally  Medical Decision Making  Medically screening exam initiated at 6:38 PM.  Appropriate orders placed.  [name] was informed that the remainder of the evaluation will be completed by another provider, this initial triage assessment does not replace that evaluation, and the importance of remaining in the ED until their evaluation is complete.     Jacqlyn Larsen, PA-C 03/04/21 1849    Sherwood Gambler, MD 03/04/21 [id number]

## 2021-03-04 NOTE — ED Triage Notes (Signed)
Pt reports severe right sided flank pain that began around 430p today radiates into her abdomen and groin. Pt states she had pulmonary emboli back in April and they saw a kidney stone in her kidney at that time.

## 2021-03-04 NOTE — ED Provider Notes (Signed)
[facility] DEPT Provider Note   CSN: [id number] Arrival date & time: 03/04/21  1803     History Chief Complaint  Patient presents with   Flank Pain    [name] is a 50 y.o. female.  HPI     [name] is a 50 y.o. female, with a history of asthma, Chiari found malformation, HTN, DVT, PE on Eliquis, presenting to the ED with left lower back pain beginning around 4:30 PM this afternoon.  Pain was sharp and aching, waxing and waning, radiating into the left flank and left lower abdomen, 4/10 at the time of my interview.  Accompanied by nausea and vomiting. This pain was preceded by about a week of hematuria intermittently.  Denies fever, dysuria, difficulty urinating, other abdominal discomfort, diarrhea, dizziness, syncope, chest pain, shortness of breath, or any other complaints.   Past Medical History:  Diagnosis Date   Asthma    Chiari I malformation ([place])    Chiari malformation 1996   decompression    High blood pressure    Hypertension    Idiopathic intracranial hypertension    Lactose intolerance    Pseudotumor cerebri    Subclavian bypass stenosis ([place]) 11/97   right   Swallowing difficulty     Patient Active Problem List   Diagnosis Date Noted   Anticoagulation management encounter 01/06/2021   Acute deep vein thrombosis (DVT) of right popliteal vein ([place]) 01/06/2021   Hx pulmonary embolism 01/03/2021   Pulmonary embolism ([place]) 12/12/2020   Acute pain of left knee 10/27/2020   Palpitations 07/12/2020   Vitamin D deficiency 09/24/2018   Insulin resistance 09/24/2018   Class 2 severe obesity with serious comorbidity and body mass index (BMI) of 39.0 to 39.9 in adult [facility]) 09/08/2018   Asthma 07/28/2018   Hypokalemia 06/25/2017   Rosacea 06/21/2017   Obesity (BMI 30-39.9) 06/22/2016   Allergic rhinitis 12/22/2015   Other hyperlipidemia 12/22/2015   Idiopathic intracranial hypertension 06/23/2015   Essential  hypertension 03/02/2015    Past Surgical History:  Procedure Laterality Date   APPENDECTOMY     chiari decompression     GANGLION CYST EXCISION Right    wrist   LAPAROSCOPIC APPENDECTOMY N/A 10/28/2015   Procedure: APPENDECTOMY LAPAROSCOPIC;  Surgeon: Armandina Gemma, MD;  Location: [facility] ORS;  Service: General;  Laterality: N/A;   SUBCLAVIAN BYPASS GRAFT Right 07/1996   WRIST FRACTURE SURGERY Right 02/2007     OB History     Gravida  0   Para  0   Term  0   Preterm  0   AB  0   Living  0      SAB  0   IAB  0   Ectopic  0   Multiple  0   Live Births  0           Family History  Problem Relation Age of Onset   Cataracts Mother    Diabetes Mother    Hypertension Mother    High Cholesterol Mother    Obesity Mother    Diabetes Father    Hypertension Father    Obesity Father    Atrial fibrillation Father    Thyroid cancer Maternal Great-grandmother    Lung cancer Paternal Grandmother     Social History   Tobacco Use   Smoking status: Never   Smokeless tobacco: Never  Vaping Use   Vaping Use: Never used  Substance Use Topics   Alcohol use: No  Alcohol/week: 0.0 standard drinks   Drug use: No    Home Medications Prior to Admission medications   Medication Sig Start Date End Date Taking? Authorizing Provider  HYDROcodone-acetaminophen (NORCO/VICODIN) 5-325 MG tablet Take 2 tablets by mouth every 4 (four) hours as needed for severe pain. 03/04/21  Yes Karlina Suares C, PA-C  ondansetron (ZOFRAN ODT) 4 MG disintegrating tablet Take 1 tablet (4 mg total) by mouth every 8 (eight) hours as needed for nausea or vomiting. 03/04/21  Yes Adam Sanjuan C, PA-C  tamsulosin (FLOMAX) 0.4 MG CAPS capsule Take 1 capsule (0.4 mg total) by mouth daily. 03/04/21  Yes Rowan Blaker C, PA-C  acetaZOLAMIDE (DIAMOX) 250 MG tablet TAKE 1 TABLET BY MOUTH 2 TIMES DAILY (KEEP UPCOMING APPT) 11/23/20 11/23/21  Lomax, Amy, NP  albuterol (VENTOLIN HFA) 108 (90 Base) MCG/ACT inhaler Inhale 2 puffs  into the lungs every 4 (four) hours as needed for wheezing or shortness of breath. 08/04/19   Sharion Balloon, FNP  apixaban (ELIQUIS) 5 MG TABS tablet Take 2 tablets (10mg ) twice daily for 7 days, then 1 tablet (5mg ) twice daily 02/07/21   [name] A, FNP  Calcium Carbonate-Vitamin D (CALCIUM + D PO) Take 1 tablet by mouth daily. Calcium 600 mg, vit D3 300 mg    [provider]  cetirizine (ZYRTEC) 5 MG tablet Take 5 mg by mouth daily.    [provider]  cholecalciferol (VITAMIN D3) 25 MCG (1000 UT) tablet Take 2,000 Units by mouth daily.     [provider]  Cyanocobalamin (VITAMIN B 12 PO) Take 5,000 mcg by mouth daily.     [provider]  Echinacea 125 MG CAPS Take 125 mg by mouth daily.     [provider]  Insulin Pen Needle (UNIFINE PENTIPS) 32G X 4 MM MISC USE WITH SAXENDA TO INJECT INTO SKIN DAILY 03/02/21   Danford, Valetta Fuller D, NP  Liraglutide -Weight Management (SAXENDA) 18 MG/3ML SOPN Inject 3 mg into the skin daily. 02/13/21   Mina Marble D, NP  lisinopril-hydrochlorothiazide (ZESTORETIC) 20-12.5 MG tablet TAKE 1 TABLET BY MOUTH ONCE A DAY 10/10/20 10/10/21  [name] A, FNP  Magnesium 250 MG TABS Take 250 mg by mouth daily.     [provider]  MegaRed Omega-3 Krill Oil 500 MG CAPS Take 1 capsule by mouth daily.    [provider]  metroNIDAZOLE (METROCREAM) 0.75 % cream Apply topically 2 (two) times daily. 07/28/18   Sharion Balloon, FNP  montelukast (SINGULAIR) 10 MG tablet TAKE 1 TABLET BY MOUTH AT BEDTIME 09/15/20 09/15/21  [name] A, FNP  Multiple Vitamin (MULTIVITAMIN) capsule Take 1 capsule by mouth daily.    [provider]  Oxymetazoline HCl (NASAL SPRAY NA) Place 2 sprays into the nose as needed (CONGESTION).     [provider]  potassium chloride SA (KLOR-CON) 20 MEQ tablet TAKE 1 TABLET BY MOUTH DAILY Patient taking differently: Take 10 mEq by mouth 2 (two) times daily. 11/10/20  11/10/21  Minus Breeding, MD  TART CHERRY PO Take 1 capsule by mouth daily.    [provider]  TURMERIC PO Take 1 capsule by mouth daily.    [provider]    Allergies    Codeine, Lyrica [pregabalin], Nyquil multi-symptom [pseudoeph-doxylamine-dm-apap], and Sulfa antibiotics  Review of Systems   Review of Systems  Constitutional:  Negative for fever.  Respiratory:  Negative for shortness of breath.   Cardiovascular:  Negative for chest pain.  Gastrointestinal:  Positive for nausea and vomiting.  Genitourinary:  Positive for flank pain and hematuria. Negative for difficulty urinating and dysuria.  Musculoskeletal:  Positive for back pain.  All other systems reviewed and are negative.  Physical Exam Updated Vital Signs BP (!) 145/92   Pulse 78   Temp 98.3 F (36.8 C) (Oral)   Resp 20   SpO2 94%   Physical Exam Vitals and nursing note reviewed.  Constitutional:      General: She is not in acute distress.    Appearance: She is well-developed. She is not diaphoretic.  HENT:     Head: Normocephalic and atraumatic.     Mouth/Throat:     Mouth: Mucous membranes are moist.     Pharynx: Oropharynx is clear.  Eyes:     Conjunctiva/sclera: Conjunctivae normal.  Cardiovascular:     Rate and Rhythm: Normal rate and regular rhythm.     Pulses: Normal pulses.  Pulmonary:     Effort: Pulmonary effort is normal. No respiratory distress.  Abdominal:     Palpations: Abdomen is soft.     Tenderness: There is no abdominal tenderness. There is no right CVA tenderness, left CVA tenderness or guarding.  Musculoskeletal:     Cervical back: Neck supple.  Skin:    General: Skin is warm and dry.  Neurological:     Mental Status: She is alert.  Psychiatric:        Mood and Affect: Mood and affect normal.        Speech: Speech normal.        Behavior: Behavior normal.    ED Results / Procedures / Treatments   Labs (all labs ordered are listed, but only abnormal  results are displayed) Labs Reviewed  BASIC METABOLIC PANEL - Abnormal; Notable for the following components:      Result Value   Potassium 3.4 (*)    Glucose, Bld 102 (*)    BUN 27 (*)    Creatinine, Ser 1.13 (*)    All other components within normal limits  URINALYSIS, ROUTINE W REFLEX MICROSCOPIC - Abnormal; Notable for the following components:   APPearance HAZY (*)    Hgb urine dipstick LARGE (*)    Leukocytes,Ua SMALL (*)    RBC / HPF >50 (*)    Bacteria, UA RARE (*)    All other components within normal limits  URINE CULTURE  CBC WITH DIFFERENTIAL/PLATELET    EKG None  Radiology CT Renal Stone Study  Result Date: 03/04/2021 CLINICAL DATA:  Severe left flank pain which began at 4:30 p.m. EXAM: CT ABDOMEN AND PELVIS WITHOUT CONTRAST TECHNIQUE: Multidetector CT imaging of the abdomen and pelvis was performed following the standard protocol without IV contrast. COMPARISON:  CT 01/31/2007 (report only).  CT 10/28/2015 FINDINGS: Lower chest: Lung bases are clear. Normal heart size. No pericardial effusion. Hepatobiliary: No visible focal liver lesion. Normal gallbladder and biliary tree without visible calcified gallstone. Pancreas: No pancreatic ductal dilatation or surrounding inflammatory changes. Spleen: Normal in size. No concerning splenic lesions. Adrenals/Urinary Tract: Normal adrenal glands. Kidneys are symmetric in size and normally located. No visible or contour deforming renal lesion. Mild asymmetric left perinephric and periureteral stranding with mild hydroureteronephrosis to the level of a 4 mm calculus in the proximal left ureter. Additional nonobstructing calculi in the the left kidney measuring up to 4 mm in size. No right urolithiasis or urinary tract dilatation. Urinary bladder is largely decompressed at the time of exam and therefore poorly  evaluated by CT imaging. No visible bladder calculi or debris. Stomach/Bowel: Distal esophagus, stomach and duodenum are  unremarkable. No arm all duodenal sweep across the midline abdomen. No large or small bowel thickening or dilatation. The appendix is surgically absent. No colonic dilatation or wall thickening. No evidence of bowel obstruction Vascular/Lymphatic: Atherosclerotic calcifications within the abdominal aorta and branch vessels. No aneurysm or ectasia. No enlarged abdominopelvic lymph nodes. Reproductive: Anteverted uterus. No concerning uterine mass. Paired fluid attenuation cystic structures in the left adnexa measuring up to 1.7 cm in size, likely functional cysts/follicles. No discrete right adnexal lesions. Other: No abdominopelvic free fluid or free gas. No bowel containing hernias. Musculoskeletal: Minimal discogenic change L5-S1. No acute osseous abnormality or suspicious osseous lesion. IMPRESSION: 1. Mild left hydroureteronephrosis to level 4 mm calculus in the proximal left ureter (2/45, 5/84). 2. Additional nonobstructing calculi in the left renal pelvis. 3. Small simple appearing cystic structures in the left adnexa measuring up to 1.7 cm in size. No follow-up imaging recommended. Note: This recommendation does not apply to patients with increased risk (genetic, family history, elevated tumor markers or other high-risk factors) of ovarian cancer. Reference: JACR 2020 Feb; 17(2):248-254 4. Prior appendectomy. 5. Minimal degenerative change L5-S1. 6.  Aortic Atherosclerosis (ICD10-I70.0). Electronically Signed   By: Lovena Le M.D.   On: 03/04/2021 19:26    Procedures Procedures   Medications Ordered in ED Medications  morphine 4 MG/ML injection 4 mg (4 mg Intravenous Given 03/04/21 1919)  ondansetron (ZOFRAN) injection 4 mg (4 mg Intravenous Given 03/04/21 1918)  HYDROcodone-acetaminophen (NORCO/VICODIN) 5-325 MG per tablet 2 tablet (2 tablets Oral Given 03/04/21 2034)    ED Course  I have reviewed the triage vital signs and the nursing notes.  Pertinent labs & imaging results that were available  during my care of the patient were reviewed by me and considered in my medical decision making (see chart for details).    MDM Rules/Calculators/A&P                          Patient presents with left flank and back pain along with intermittent hematuria. Patient is nontoxic appearing, afebrile, not tachycardic, not tachypneic, not hypotensive, maintains excellent SPO2 on room air, and is in no apparent distress.   I have reviewed the patient's chart to obtain more information.   I reviewed and interpreted the patient's labs and radiological studies. 4 mm proximal left ureteral stone noted on CT.  Incidentally noted left adnexal cystic structure.  This incidental finding was discussed with the patient as well as things that would make her high risk requiring follow-up assessment on this.  She denies any increased risk factors.  Tolerating PO prior to discharge.  Pain reasonably well controlled. The patient was given instructions for home care as well as return precautions. Patient voices understanding of these instructions, accepts the plan, and is comfortable with discharge.   Final Clinical Impression(s) / ED Diagnoses Final diagnoses:  Ureterolithiasis  Kidney stone    Rx / DC Orders ED Discharge Orders          Ordered    ondansetron (ZOFRAN ODT) 4 MG disintegrating tablet  Every 8 hours PRN        03/04/21 2130    tamsulosin (FLOMAX) 0.4 MG CAPS capsule  Daily        03/04/21 2130    HYDROcodone-acetaminophen (NORCO/VICODIN) 5-325 MG tablet  Every 4 hours PRN  03/04/21 2130             Lorayne Bender, PA-C 03/04/21 2153    Valarie Merino, MD 03/04/21 2352

## 2021-03-07 ENCOUNTER — Other Ambulatory Visit (HOSPITAL_COMMUNITY): Payer: Self-pay

## 2021-03-07 MED FILL — Potassium Chloride Microencapsulated Crys ER Tab 20 mEq: ORAL | 90 days supply | Qty: 90 | Fill #0 | Status: AC

## 2021-03-08 LAB — URINE CULTURE: Culture: 70000 — AB

## 2021-03-08 NOTE — Progress Notes (Signed)
Chief Complaint:   OBESITY [name] is here to discuss her progress with her obesity treatment plan along with follow-up of her obesity related diagnoses. [name] is on the Category 3 Plan and states she is following her eating plan approximately 88% of the time. [name] states she is doing cardio, planks, and crunches 40 minutes 2 times per week.  Today's visit was #: 60 Starting weight: 250 lbs Starting date: 09/08/2018 Today's weight: 244 lbs Today's date: 03/02/2021 Total lbs lost to date: 6 Total lbs lost since last in-office visit: 0  Interim History: [name] had a virtual appt with her PCP yesterday for hematuria with only slight left flank pain- all now resolved. She is on Saxenda 1.2 mg. The 1.8 mg dose drove her appetite down to where she was unable to consume all foods on plan. Of Note: Husband was diagnosed with T2D yesterday- A1c >11.  Subjective:   1. Essential hypertension BP/HR excellent at OV.  Blake is on Zestoretic 20-12.5 mg QD.  2. Allergic rhinitis due to pollen, unspecified seasonality Allergies are well controlled with daily Singulair 10 mg QD.  When pt ran out of Zyrtec 5 mg Qd for 3 days, allergic reaction to cat worsened. She is back on Zyrtec.  3. At risk for constipation [name] is at risk for constipation due to being on maximum dose of Saxenda for obesity.  Assessment/Plan:   1. Essential hypertension Chrystian is working on healthy weight loss and exercise to improve blood pressure control. We will watch for signs of hypotension as she continues her lifestyle modifications. Continue Zestoretic 20-12.5 mg.  2. Allergic rhinitis due to pollen, unspecified seasonality Continue Singulair and Zyrtec QD.  3. At risk for constipation [name] was given approximately 15 minutes of counseling today regarding prevention of constipation. She was encouraged to increase water and fiber intake.    4. Obesity with current BMI 40.7  [name] is currently in the action stage of  change. As such, her goal is to continue with weight loss efforts. She has agreed to the Category 3 Plan.   - Insulin Pen Needle (UNIFINE PENTIPS) 32G X 4 MM MISC; USE WITH SAXENDA TO INJECT INTO SKIN DAILY  Dispense: 100 each; Refill: 0  Exercise goals:  As is- planks (up to 20 sec.)  Behavioral modification strategies: increasing lean protein intake, decreasing simple carbohydrates, meal planning and cooking strategies, keeping healthy foods in the home, and planning for success.  [name] has agreed to follow-up with our clinic in 4 weeks. She was informed of the importance of frequent follow-up visits to maximize her success with intensive lifestyle modifications for her multiple health conditions.   Objective:   Blood pressure 116/75, pulse 84, temperature 98.1 F (36.7 C), height 5\' 5"  (1.651 m), weight 244 lb (110.7 kg), SpO2 96 %. Body mass index is 40.6 kg/m.  General: Cooperative, alert, well developed, in no acute distress. HEENT: Conjunctivae and lids unremarkable. Cardiovascular: Regular rhythm.  Lungs: Normal work of breathing. Neurologic: No focal deficits.   Lab Results  Component Value Date   CREATININE 1.13 (H) 03/04/2021   BUN 27 (H) 03/04/2021   NA 141 03/04/2021   K 3.4 (L) 03/04/2021   CL 107 03/04/2021   CO2 23 03/04/2021   Lab Results  Component Value Date   ALT 17 01/06/2021   AST 11 (L) 01/06/2021   ALKPHOS 43 01/06/2021   BILITOT 0.3 01/06/2021   Lab Results  Component Value Date   HGBA1C 5.0  06/13/2020   HGBA1C 5.1 12/21/2019   HGBA1C 5.1 05/04/2019   HGBA1C 5.1 01/29/2019   HGBA1C 5.2 09/08/2018   Lab Results  Component Value Date   INSULIN 18.2 06/13/2020   INSULIN 22.1 12/21/2019   INSULIN 13.2 05/04/2019   INSULIN 16.1 01/29/2019   INSULIN 16.9 09/08/2018   Lab Results  Component Value Date   TSH 2.810 09/15/2020   Lab Results  Component Value Date   CHOL 177 09/15/2020   HDL 58 09/15/2020   LDLCALC 105 (H) 09/15/2020   TRIG  72 09/15/2020   CHOLHDL 3.1 09/15/2020   Lab Results  Component Value Date   VD25OH 60.1 06/13/2020   VD25OH 58.5 12/21/2019   VD25OH 33.0 08/04/2019   Lab Results  Component Value Date   WBC 10.3 03/04/2021   HGB 14.9 03/04/2021   HCT 45.8 03/04/2021   MCV 93.1 03/04/2021   PLT 285 03/04/2021     Attestation Statements:   Reviewed by clinician on day of visit: allergies, medications, problem list, medical history, surgical history, family history, social history, and previous encounter notes.  Coral Ceo, CMA, am acting as transcriptionist for Mina Marble, NP.  I have reviewed the above documentation for accuracy and completeness, and I agree with the above. -  Shalimar Mcclain d. Dennise Bamber, NP-C

## 2021-03-09 ENCOUNTER — Telehealth: Payer: Self-pay | Admitting: Emergency Medicine

## 2021-03-09 NOTE — Telephone Encounter (Signed)
Post ED Visit - Positive Culture Follow-up  Culture report reviewed by antimicrobial stewardship pharmacist: [place] Team []  Elenor Quinones, Pharm.D. []  Heide Guile, Pharm.D., BCPS AQ-ID []  Parks Neptune, Pharm.D., BCPS []  Alycia Rossetti, Pharm.D., BCPS []  [place], Pharm.D., BCPS, AAHIVP []  Legrand Como, Pharm.D., BCPS, AAHIVP []  Salome Arnt, PharmD, BCPS []  Johnnette Gourd, PharmD, BCPS []  Hughes Better, PharmD, BCPS []  Leeroy Cha, PharmD []  Laqueta Linden, PharmD, BCPS []  Albertina Parr, PharmD  [place] Team []  Leodis Sias, PharmD []  Lindell Spar, PharmD []  Royetta Asal, PharmD []  Graylin Shiver, Rph []  Rema Fendt) Glennon Mac, PharmD []  Arlyn Dunning, PharmD []  Netta Cedars, PharmD []  Dia Sitter, PharmD []  Leone Haven, PharmD []  Gretta Arab, PharmD []  Theodis Shove, PharmD []  Peggyann Juba, PharmD []  Reuel Boom, PharmD   Positive urine culture Treated with none, asymptomatic, no further patient follow-up is required at this time.  Hazle Nordmann 03/09/2021, 11:15 AM

## 2021-03-09 NOTE — Telephone Encounter (Deleted)
Post ED Visit - Positive Culture Follow-up  Culture report reviewed by antimicrobial stewardship pharmacist: [place] Team []  Elenor Quinones, Pharm.D. []  Heide Guile, Pharm.D., BCPS AQ-ID []  Parks Neptune, Pharm.D., BCPS []  Alycia Rossetti, Pharm.D., BCPS []  [place], Pharm.D., BCPS, AAHIVP []  Legrand Como, Pharm.D., BCPS, AAHIVP []  Salome Arnt, PharmD, BCPS []  Johnnette Gourd, PharmD, BCPS []  Hughes Better, PharmD, BCPS []  Leeroy Cha, PharmD []  Laqueta Linden, PharmD, BCPS []  Albertina Parr, PharmD  [place] Team []  Leodis Sias, PharmD []  Lindell Spar, PharmD []  Royetta Asal, PharmD []  Graylin Shiver, Rph []  Rema Fendt) Glennon Mac, PharmD []  Arlyn Dunning, PharmD []  Netta Cedars, PharmD []  Dia Sitter, PharmD []  Leone Haven, PharmD []  Gretta Arab, PharmD []  Theodis Shove, PharmD []  Peggyann Juba, PharmD []  Reuel Boom, PharmD   Positive *** culture Treated with ***, organism sensitive to the same and no further patient follow-up is required at this time.  Hazle Nordmann 03/09/2021, 11:13 AM  Post ED Visit - Positive Culture Follow-up  Culture report reviewed by antimicrobial stewardship pharmacist: [place] Team []  Elenor Quinones, Pharm.D. []  Heide Guile, Pharm.D., BCPS AQ-ID []  Parks Neptune, Pharm.D., BCPS []  Alycia Rossetti, Pharm.D., BCPS []  [place], Pharm.D., BCPS, AAHIVP []  Legrand Como, Pharm.D., BCPS, AAHIVP []  Salome Arnt, PharmD, BCPS []  Johnnette Gourd, PharmD, BCPS []  Hughes Better, PharmD, BCPS []  Leeroy Cha, PharmD []  Laqueta Linden, PharmD, BCPS []  Albertina Parr, PharmD  [place] Team []  Leodis Sias, PharmD []  Lindell Spar, PharmD []  Royetta Asal, PharmD []  Graylin Shiver, Rph []  Rema Fendt) Glennon Mac, PharmD []  Arlyn Dunning, PharmD []  Netta Cedars, PharmD []  Dia Sitter, PharmD []  Leone Haven, PharmD []  Gretta Arab, PharmD []  Theodis Shove,  PharmD []  Peggyann Juba, PharmD []  Reuel Boom, PharmD   Positive urine culture Treated with asymptomatic,no further patient follow-up is required at this time.  Hazle Nordmann 03/09/2021, 11:13 AM

## 2021-03-09 NOTE — Progress Notes (Signed)
ED Antimicrobial Stewardship Positive Culture Follow Up   [name] is an 50 y.o. female who presented to [facility] on 03/04/2021 with a chief complaint of  Chief Complaint  Patient presents with   Flank Pain    Recent Results (from the past 720 hour(s))  Urine Culture     Status: None   Collection Time: 03/01/21  1:26 PM   Specimen: Urine   UR  Result Value Ref Range Status   Urine Culture, Routine Final report  Final   Organism ID, Bacteria Comment  Final    Comment: Mixed urogenital flora 25,000-50,000 colony forming units per mL   Urine culture     Status: Abnormal   Collection Time: 03/04/21  8:09 PM   Specimen: Urine, Random  Result Value Ref Range Status   Specimen Description   Final    URINE, RANDOM Performed at [facility] [address], [place], [place] [address]    Special Requests   Final    NONE Performed at [facility], [place] [address] [place], [place] [address]    Culture (A)  Final    70,000 COLONIES/mL LACTOBACILLUS SPECIES 10,000 COLONIES/mL ENTEROCOCCUS FAECALIS Standardized susceptibility testing for this organism is not available. FOR LACTOBACILLUS SPECIES Performed at [facility], [place] [address], [place], [place] [address]    Report Status 03/08/2021 FINAL  Final   Organism ID, Bacteria ENTEROCOCCUS FAECALIS (A)  Final      Susceptibility   Enterococcus faecalis - MIC*    AMPICILLIN <=2 SENSITIVE Sensitive     NITROFURANTOIN <=16 SENSITIVE Sensitive     VANCOMYCIN 2 SENSITIVE Sensitive     * 10,000 COLONIES/mL ENTEROCOCCUS FAECALIS   No treatment necessary.  ED Provider: Dene Gentry, MD  Faustino Congress 03/09/2021, 10:42 AM Student Pharmacist

## 2021-03-14 ENCOUNTER — Ambulatory Visit (HOSPITAL_COMMUNITY): Payer: 59 | Attending: Cardiovascular Disease

## 2021-03-14 ENCOUNTER — Other Ambulatory Visit: Payer: Self-pay

## 2021-03-14 DIAGNOSIS — I2602 Saddle embolus of pulmonary artery with acute cor pulmonale: Secondary | ICD-10-CM | POA: Diagnosis not present

## 2021-03-14 MED ORDER — PERFLUTREN LIPID MICROSPHERE
1.0000 mL | INTRAVENOUS | Status: AC | PRN
  Administered 2021-03-14: 1 mL via INTRAVENOUS

## 2021-03-15 LAB — ECHOCARDIOGRAM COMPLETE
Area-P 1/2: 5.97 cm2
S' Lateral: 2.8 cm

## 2021-03-23 ENCOUNTER — Other Ambulatory Visit (HOSPITAL_COMMUNITY): Payer: Self-pay

## 2021-03-23 MED FILL — Montelukast Sodium Tab 10 MG (Base Equiv): ORAL | 90 days supply | Qty: 90 | Fill #1 | Status: AC

## 2021-03-27 ENCOUNTER — Ambulatory Visit (INDEPENDENT_AMBULATORY_CARE_PROVIDER_SITE_OTHER): Payer: 59 | Admitting: Adult Health

## 2021-03-27 ENCOUNTER — Encounter (INDEPENDENT_AMBULATORY_CARE_PROVIDER_SITE_OTHER): Payer: Self-pay | Admitting: Adult Health

## 2021-03-27 ENCOUNTER — Other Ambulatory Visit: Payer: Self-pay

## 2021-03-27 VITALS — BP 134/83 | HR 86 | Temp 97.8°F | Ht 65.0 in | Wt 242.0 lb

## 2021-03-27 DIAGNOSIS — E876 Hypokalemia: Secondary | ICD-10-CM | POA: Diagnosis not present

## 2021-03-27 DIAGNOSIS — Z86711 Personal history of pulmonary embolism: Secondary | ICD-10-CM

## 2021-03-27 DIAGNOSIS — Z6839 Body mass index (BMI) 39.0-39.9, adult: Secondary | ICD-10-CM

## 2021-03-29 NOTE — Progress Notes (Signed)
Chief Complaint:   OBESITY [name] is here to discuss her progress with her obesity treatment plan along with follow-up of her obesity related diagnoses. [name] is on the Category 3 Plan and states she is following her eating plan approximately 85% of the time. [name] states she is doing cardio 30-50 minutes 3-4 times per week.  Today's visit was #: 19 Starting weight: 250 lbs Starting date: 09/08/2018 Today's weight: 242 lbs Today's date: 03/27/2021 Total lbs lost to date: 8 Total lbs lost since last in-office visit: 2  Interim History: [name] was treated for ureterolithiasis on 03/04/2021. Denies any current GU sx's or pain at present. She is on Saxenda 1.2 mg QD and reports experiencing afternoon polyphagia.  Subjective:   1. Hypokalemia ED labs from 03/04/21- potassium level 3.4. [name] was off potassium supplement for 1.5 days prior to labs. She denies palpitations.  2. Hx pulmonary embolism [name] has stopped OTC tart cherry/tumeric since starting Eliquis 5 mg BID. She denies unusual bleeding /bruising.  Assessment/Plan:   1. Hypokalemia Continue daily Klor-Con 20 mEq QD.  2. Hx pulmonary embolism Continue Eliquis as directed by PCP.  3. Obesity with current BMI 40.5  [name] is currently in the action stage of change. As such, her goal is to continue with weight loss efforts. She has agreed to the Category 3 Plan.   Increase Saxenda to 1.4-1.8 mg QD, depending on tolerance.  Exercise goals:  As is  Behavioral modification strategies: increasing lean protein intake, decreasing simple carbohydrates, meal planning and cooking strategies, keeping healthy foods in the home, and planning for success.  [name] has agreed to follow-up with our clinic in 3 weeks. She was informed of the importance of frequent follow-up visits to maximize her success with intensive lifestyle modifications for her multiple health conditions.   Objective:   Blood pressure 134/83, pulse 86, temperature 97.8 F  (36.6 C), height '5\' 5"'$  (1.651 m), weight 242 lb (109.8 kg), SpO2 97 %. Body mass index is 40.27 kg/m.  General: Cooperative, alert, well developed, in no acute distress. HEENT: Conjunctivae and lids unremarkable. Cardiovascular: Regular rhythm.  Lungs: Normal work of breathing. Neurologic: No focal deficits.   Lab Results  Component Value Date   CREATININE 1.13 (H) 03/04/2021   BUN 27 (H) 03/04/2021   NA 141 03/04/2021   K 3.4 (L) 03/04/2021   CL 107 03/04/2021   CO2 23 03/04/2021   Lab Results  Component Value Date   ALT 17 01/06/2021   AST 11 (L) 01/06/2021   ALKPHOS 43 01/06/2021   BILITOT 0.3 01/06/2021   Lab Results  Component Value Date   HGBA1C 5.0 06/13/2020   HGBA1C 5.1 12/21/2019   HGBA1C 5.1 05/04/2019   HGBA1C 5.1 01/29/2019   HGBA1C 5.2 09/08/2018   Lab Results  Component Value Date   INSULIN 18.2 06/13/2020   INSULIN 22.1 12/21/2019   INSULIN 13.2 05/04/2019   INSULIN 16.1 01/29/2019   INSULIN 16.9 09/08/2018   Lab Results  Component Value Date   TSH 2.810 09/15/2020   Lab Results  Component Value Date   CHOL 177 09/15/2020   HDL 58 09/15/2020   LDLCALC 105 (H) 09/15/2020   TRIG 72 09/15/2020   CHOLHDL 3.1 09/15/2020   Lab Results  Component Value Date   VD25OH 60.1 06/13/2020   VD25OH 58.5 12/21/2019   VD25OH 33.0 08/04/2019   Lab Results  Component Value Date   WBC 10.3 03/04/2021   HGB 14.9 03/04/2021  HCT 45.8 03/04/2021   MCV 93.1 03/04/2021   PLT 285 03/04/2021    Attestation Statements:   Reviewed by clinician on day of visit: allergies, medications, problem list, medical history, surgical history, family history, social history, and previous encounter notes.  Time spent on visit including pre-visit chart review and post-visit care and charting was 30 minutes.   Coral Ceo, CMA, am acting as transcriptionist for Mina Marble, NP.  I have reviewed the above documentation for accuracy and completeness, and I  agree with the above. -  Gelila Well d. Lillymae Duet, NP-C

## 2021-04-10 ENCOUNTER — Other Ambulatory Visit: Payer: Self-pay | Admitting: Family

## 2021-04-10 DIAGNOSIS — G932 Benign intracranial hypertension: Secondary | ICD-10-CM

## 2021-04-10 DIAGNOSIS — I10 Essential (primary) hypertension: Secondary | ICD-10-CM

## 2021-04-11 ENCOUNTER — Other Ambulatory Visit (HOSPITAL_COMMUNITY): Payer: Self-pay

## 2021-04-12 DIAGNOSIS — N202 Calculus of kidney with calculus of ureter: Secondary | ICD-10-CM | POA: Diagnosis not present

## 2021-04-13 ENCOUNTER — Other Ambulatory Visit (HOSPITAL_COMMUNITY): Payer: Self-pay

## 2021-04-13 MED ORDER — LISINOPRIL-HYDROCHLOROTHIAZIDE 20-12.5 MG PO TABS
1.0000 | ORAL_TABLET | Freq: Every day | ORAL | 1 refills | Status: DC
  Filled 2021-04-13: qty 90, 90d supply, fill #0
  Filled 2021-07-09: qty 90, 90d supply, fill #1

## 2021-04-17 ENCOUNTER — Other Ambulatory Visit: Payer: Self-pay

## 2021-04-17 ENCOUNTER — Ambulatory Visit (INDEPENDENT_AMBULATORY_CARE_PROVIDER_SITE_OTHER): Payer: 59 | Admitting: Adult Health

## 2021-04-17 ENCOUNTER — Encounter (INDEPENDENT_AMBULATORY_CARE_PROVIDER_SITE_OTHER): Payer: Self-pay | Admitting: Adult Health

## 2021-04-17 VITALS — BP 121/79 | HR 85 | Temp 98.2°F | Ht 65.0 in | Wt 244.0 lb

## 2021-04-17 DIAGNOSIS — N200 Calculus of kidney: Secondary | ICD-10-CM | POA: Diagnosis not present

## 2021-04-17 DIAGNOSIS — Z6839 Body mass index (BMI) 39.0-39.9, adult: Secondary | ICD-10-CM

## 2021-04-17 DIAGNOSIS — E66812 Obesity, class 2: Secondary | ICD-10-CM

## 2021-04-17 DIAGNOSIS — I10 Essential (primary) hypertension: Secondary | ICD-10-CM

## 2021-04-18 DIAGNOSIS — N200 Calculus of kidney: Secondary | ICD-10-CM | POA: Insufficient documentation

## 2021-04-18 NOTE — Progress Notes (Signed)
Chief Complaint:   OBESITY [name] is here to discuss her progress with her obesity treatment plan along with follow-up of her obesity related diagnoses. [name] is on the Category 3 Plan and states she is following her eating plan approximately 87% of the time. [name] states she is doing cardio and the machine for 30-40 minutes 3 times per week.  Today's visit was #: 58 Starting weight: 250 lbs Starting date: 09/08/2018 Today's weight: 244 lbs Today's date: 04/17/2021 Total lbs lost to date: 6 Total lbs lost since last in-office visit: 0  Interim History: [name]'s Saxenda was increased at her last office visit from 1.4 mg to 1.8 mg depending on the tolerance.  She denies mass in neck, dysphagia, dyspepsia, or persistent hoarseness. She denies GI upset with recent increase in GLP-1 dose. She notes increased left knee pain, and she currently rates her pain at 3/10 with use.  Subjective:   1. Essential hypertension [name]'s blood pressure and heart rate are both excellent at her office visit today. She is on Zestoretic.  2. Nephrolithiasis [name] experienced acute nephrolithiasis early July.  She was treated at ED and referred to [facility]. She denies any acute GU sx's at present. She denies back pain at present.  Assessment/Plan:   1. Essential hypertension [name] will continue Zestoretic 20-12.5 mg q daily. She will continue working on healthy weight loss and exercise to improve blood pressure control. We will watch for signs of hypotension as she continues her lifestyle modifications.  2. Nephrolithiasis [name] will continue to follow up with [facility] in 6 months.  3. Obesity with current BMI 40.6 [name] is currently in the action stage of change. As such, her goal is to continue with weight loss efforts. She has agreed to the Category 3 Plan.   We discussed various medication options to help [name] with her weight loss efforts and we both agreed to continue Saxenda at 1.8 mg once  daily, as she is tolerating this dose well. No refill needed.  Exercise goals: As is.  Behavioral modification strategies: increasing lean protein intake, decreasing simple carbohydrates, meal planning and cooking strategies, keeping healthy foods in the home, and planning for success.  [name] has agreed to follow-up with our clinic in 3 weeks. She was informed of the importance of frequent follow-up visits to maximize her success with intensive lifestyle modifications for her multiple health conditions.   Objective:   Blood pressure 121/79, pulse 85, temperature 98.2 F (36.8 C), height '5\' 5"'$  (1.651 m), weight 244 lb (110.7 kg), SpO2 98 %. Body mass index is 40.6 kg/m.  General: Cooperative, alert, well developed, in no acute distress. HEENT: Conjunctivae and lids unremarkable. Cardiovascular: Regular rhythm.  Lungs: Normal work of breathing. Neurologic: No focal deficits.   Lab Results  Component Value Date   CREATININE 1.13 (H) 03/04/2021   BUN 27 (H) 03/04/2021   NA 141 03/04/2021   K 3.4 (L) 03/04/2021   CL 107 03/04/2021   CO2 23 03/04/2021   Lab Results  Component Value Date   ALT 17 01/06/2021   AST 11 (L) 01/06/2021   ALKPHOS 43 01/06/2021   BILITOT 0.3 01/06/2021   Lab Results  Component Value Date   HGBA1C 5.0 06/13/2020   HGBA1C 5.1 12/21/2019   HGBA1C 5.1 05/04/2019   HGBA1C 5.1 01/29/2019   HGBA1C 5.2 09/08/2018   Lab Results  Component Value Date   INSULIN 18.2 06/13/2020   INSULIN 22.1 12/21/2019   INSULIN 13.2 05/04/2019  INSULIN 16.1 01/29/2019   INSULIN 16.9 09/08/2018   Lab Results  Component Value Date   TSH 2.810 09/15/2020   Lab Results  Component Value Date   CHOL 177 09/15/2020   HDL 58 09/15/2020   LDLCALC 105 (H) 09/15/2020   TRIG 72 09/15/2020   CHOLHDL 3.1 09/15/2020   Lab Results  Component Value Date   VD25OH 60.1 06/13/2020   VD25OH 58.5 12/21/2019   VD25OH 33.0 08/04/2019   Lab Results  Component Value Date   WBC  10.3 03/04/2021   HGB 14.9 03/04/2021   HCT 45.8 03/04/2021   MCV 93.1 03/04/2021   PLT 285 03/04/2021   No results found for: IRON, TIBC, FERRITIN  Attestation Statements:   Reviewed by clinician on day of visit: allergies, medications, problem list, medical history, surgical history, family history, social history, and previous encounter notes.  Time spent on visit including pre-visit chart review and post-visit care and charting was 30 minutes.    Wilhemena Durie, am acting as transcriptionist for Mina Marble, NP.  I have reviewed the above documentation for accuracy and completeness, and I agree with the above. -  Casha Estupinan d. Tamecca Artiga, NP-C

## 2021-04-19 ENCOUNTER — Encounter (INDEPENDENT_AMBULATORY_CARE_PROVIDER_SITE_OTHER): Payer: Self-pay

## 2021-04-19 ENCOUNTER — Other Ambulatory Visit (HOSPITAL_COMMUNITY): Payer: Self-pay | Admitting: Urology

## 2021-04-19 ENCOUNTER — Ambulatory Visit (HOSPITAL_COMMUNITY)
Admission: RE | Admit: 2021-04-19 | Discharge: 2021-04-19 | Disposition: A | Discharge status: Home / Self Care | Payer: 59 | Source: Ambulatory Visit | Attending: Urology | Admitting: Urology

## 2021-04-19 DIAGNOSIS — N200 Calculus of kidney: Secondary | ICD-10-CM | POA: Diagnosis not present

## 2021-04-19 DIAGNOSIS — N201 Calculus of ureter: Secondary | ICD-10-CM | POA: Insufficient documentation

## 2021-04-19 DIAGNOSIS — I878 Other specified disorders of veins: Secondary | ICD-10-CM | POA: Diagnosis not present

## 2021-04-19 DIAGNOSIS — Z0389 Encounter for observation for other suspected diseases and conditions ruled out: Secondary | ICD-10-CM | POA: Diagnosis not present

## 2021-04-23 ENCOUNTER — Other Ambulatory Visit (INDEPENDENT_AMBULATORY_CARE_PROVIDER_SITE_OTHER): Payer: Self-pay | Admitting: Adult Health

## 2021-04-24 ENCOUNTER — Other Ambulatory Visit (HOSPITAL_COMMUNITY): Payer: Self-pay

## 2021-04-24 MED ORDER — SAXENDA 18 MG/3ML ~~LOC~~ SOPN
3.0000 mg | PEN_INJECTOR | Freq: Every day | SUBCUTANEOUS | 0 refills | Status: DC
  Filled 2021-04-24: qty 15, 30d supply, fill #0

## 2021-04-24 NOTE — Telephone Encounter (Signed)
LAST APPOINTMENT DATE: 04/17/21 NEXT APPOINTMENT DATE: 05/11/21   [place] [address] Phone: [phone] Fax: [phone]  [id number] [id number]: [phone]: [phone] Patient is requesting a refill of the following medications: Pending Prescriptions:                       Disp   Refills   Liraglutide -Weight Management (SAXENDA) 1*15 mL  0       Sig: Inject 3 mg into the skin daily.   Date last filled: 02/13/21 [facility] Lab Results      Component                Value               Date                      HGBA1C                   5.0                 06/13/2020                HGBA1C                   5.1                 12/21/2019                HGBA1C                   5.1                 05/04/2019           Lab Results      Component                Value               Date                      LDLCALC                  105 (H)             09/15/2020                CREATININE               1.13 (H)            03/04/2021           Lab Results      Component                Value               Date                      VD25OH                   60.1                06/13/2020                VD25OH  58.5                12/21/2019                VD25OH                   33.0                08/04/2019            BP Readings from Last 3 Encounters: 04/17/21 : 121/79 03/27/21 : 134/83 03/04/21 : 113/71

## 2021-05-02 ENCOUNTER — Other Ambulatory Visit (HOSPITAL_COMMUNITY): Payer: Self-pay

## 2021-05-11 ENCOUNTER — Other Ambulatory Visit: Payer: Self-pay

## 2021-05-11 ENCOUNTER — Ambulatory Visit (INDEPENDENT_AMBULATORY_CARE_PROVIDER_SITE_OTHER): Payer: 59 | Admitting: Adult Health

## 2021-05-11 ENCOUNTER — Encounter (INDEPENDENT_AMBULATORY_CARE_PROVIDER_SITE_OTHER): Payer: Self-pay | Admitting: Adult Health

## 2021-05-11 VITALS — BP 128/69 | HR 81 | Temp 97.9°F | Ht 65.0 in | Wt 250.0 lb

## 2021-05-11 DIAGNOSIS — Z6841 Body Mass Index (BMI) 40.0 and over, adult: Secondary | ICD-10-CM | POA: Diagnosis not present

## 2021-05-11 DIAGNOSIS — I10 Essential (primary) hypertension: Secondary | ICD-10-CM

## 2021-05-11 NOTE — Progress Notes (Signed)
Chief Complaint:   OBESITY [name] is here to discuss her progress with her obesity treatment plan along with follow-up of her obesity related diagnoses. [name] is on the Category 3 Plan and states she is following her eating plan approximately 80% of the time. [name] states she is doing strength training for 30 minutes 3 times per week.  Today's visit was #: 20 Starting weight: 250 lbs Starting date: 09/08/2018 Today's weight: 250 lbs Today's date: 05/11/2021 Total lbs lost to date: 0 Total lbs lost since last in-office visit: 0  Interim History: [name] will be off the next two weeks.  During the 1st week, she will travel to [place] with her mother and sister- plans on hiking as often as possible. She is on Saxenda 1.8 mg daily.  She denies mass in neck, dysphagia, dyspepsia, or persistent hoarseness.  Subjective:   1. Essential hypertension BP/HR excellent at office visit. She is on lisinopril/HCTZ 20-12.5 mg daily.  BP Readings from Last 3 Encounters:  05/11/21 128/69  04/17/21 121/79  03/27/21 134/83   Assessment/Plan:   1. Essential hypertension [name] is working on healthy weight loss and exercise to improve blood pressure control. We will watch for signs of hypotension as she continues her lifestyle modifications. Continue lisinopril/HCTZ 20-12.5 mg daily.  2. Obesity with current BMI of 41.6  [name] is currently in the action stage of change. As such, her goal is to continue with weight loss efforts. She has agreed to the Category 3 Plan.   Exercise goals:  As is.  Behavioral modification strategies: increasing lean protein intake, decreasing simple carbohydrates, meal planning and cooking strategies, keeping healthy foods in the home, travel eating strategies, and keeping a strict food journal.  Discussed 10:1 ratio.  Provided protein goals for each meal.  [name] has agreed to follow-up with our clinic in 3 weeks. She was informed of the importance of frequent  follow-up visits to maximize her success with intensive lifestyle modifications for her multiple health conditions.   Objective:   Blood pressure 128/69, pulse 81, temperature 97.9 F (36.6 C), height '5\' 5"'$  (1.651 m), weight 250 lb (113.4 kg), SpO2 96 %. Body mass index is 41.6 kg/m.  General: Cooperative, alert, well developed, in no acute distress. HEENT: Conjunctivae and lids unremarkable. Cardiovascular: Regular rhythm.  Lungs: Normal work of breathing. Neurologic: No focal deficits.   Lab Results  Component Value Date   CREATININE 1.13 (H) 03/04/2021   BUN 27 (H) 03/04/2021   NA 141 03/04/2021   K 3.4 (L) 03/04/2021   CL 107 03/04/2021   CO2 23 03/04/2021   Lab Results  Component Value Date   ALT 17 01/06/2021   AST 11 (L) 01/06/2021   ALKPHOS 43 01/06/2021   BILITOT 0.3 01/06/2021   Lab Results  Component Value Date   HGBA1C 5.0 06/13/2020   HGBA1C 5.1 12/21/2019   HGBA1C 5.1 05/04/2019   HGBA1C 5.1 01/29/2019   HGBA1C 5.2 09/08/2018   Lab Results  Component Value Date   INSULIN 18.2 06/13/2020   INSULIN 22.1 12/21/2019   INSULIN 13.2 05/04/2019   INSULIN 16.1 01/29/2019   INSULIN 16.9 09/08/2018   Lab Results  Component Value Date   TSH 2.810 09/15/2020   Lab Results  Component Value Date   CHOL 177 09/15/2020   HDL 58 09/15/2020   LDLCALC 105 (H) 09/15/2020   TRIG 72 09/15/2020   CHOLHDL 3.1 09/15/2020   Lab Results  Component Value Date  VD25OH 60.1 06/13/2020   VD25OH 58.5 12/21/2019   VD25OH 33.0 08/04/2019   Lab Results  Component Value Date   WBC 10.3 03/04/2021   HGB 14.9 03/04/2021   HCT 45.8 03/04/2021   MCV 93.1 03/04/2021   PLT 285 03/04/2021   Attestation Statements:   Reviewed by clinician on day of visit: allergies, medications, problem list, medical history, surgical history, family history, social history, and previous encounter notes.  Time spent on visit including pre-visit chart review and post-visit care and  charting was 28 minutes.   I, Water quality scientist, CMA, am acting as Location manager for Mina Marble, NP.  I have reviewed the above documentation for accuracy and completeness, and I agree with the above. - Alzina Golda d. Analycia Khokhar, NP-C

## 2021-05-28 MED FILL — Acetazolamide Tab 250 MG: ORAL | 90 days supply | Qty: 180 | Fill #1 | Status: AC

## 2021-05-29 ENCOUNTER — Ambulatory Visit (INDEPENDENT_AMBULATORY_CARE_PROVIDER_SITE_OTHER): Payer: 59 | Admitting: Family Medicine

## 2021-05-29 ENCOUNTER — Encounter (INDEPENDENT_AMBULATORY_CARE_PROVIDER_SITE_OTHER): Payer: Self-pay | Admitting: Family Medicine

## 2021-05-29 ENCOUNTER — Other Ambulatory Visit: Payer: Self-pay

## 2021-05-29 ENCOUNTER — Other Ambulatory Visit (HOSPITAL_COMMUNITY): Payer: Self-pay

## 2021-05-29 VITALS — BP 133/78 | HR 90 | Temp 98.3°F | Ht 65.0 in | Wt 253.0 lb

## 2021-05-29 DIAGNOSIS — E88819 Insulin resistance, unspecified: Secondary | ICD-10-CM

## 2021-05-29 DIAGNOSIS — Z6841 Body Mass Index (BMI) 40.0 and over, adult: Secondary | ICD-10-CM | POA: Diagnosis not present

## 2021-05-29 DIAGNOSIS — E8881 Metabolic syndrome: Secondary | ICD-10-CM

## 2021-05-30 NOTE — Progress Notes (Signed)
Chief Complaint:   OBESITY [name] is here to discuss her progress with her obesity treatment plan along with follow-up of her obesity related diagnoses. [name] is on the Category 3 Plan and states she is following her eating plan approximately 70% of the time. [name] states she is doing gym exercise for 60 minutes 4 times per week.  Today's visit was #: 26 Starting weight: 250 lbs Starting date: 09/08/2018 Today's weight: 253 lbs Today's date: 05/29/2021 Total lbs lost to date: 0 lbs Total lbs lost since last in-office visit: 0 lbs  Interim History: [name] recently went on a trip to [place]. She is up 23 lbs since April when she was diagnosed with PE. She is on 1.8 mg of Saxenda and tolerating it well. She and her husband are working on establishing schedule to go to the gym.   Subjective:   1. Insulin resistance [name] is on Saxenda 1.8 mg. Her husband was recently diagnosed with diabetes (A1C >11). She has 2 more months supply of Saxenda at home.  Lab Results  Component Value Date   INSULIN 18.2 06/13/2020   INSULIN 22.1 12/21/2019   INSULIN 13.2 05/04/2019   INSULIN 16.1 01/29/2019   INSULIN 16.9 09/08/2018   Lab Results  Component Value Date   HGBA1C 5.0 06/13/2020    Assessment/Plan:   1. Insulin resistance [name] agrees to increase dose of Saxenda to 2.4 mg daily. We discussed Mounjaro and she will consider it.   2. Obesity with current BMI of 42.1 [name] is currently in the action stage of change. As such, her goal is to continue with weight loss efforts. She has agreed to the Category 3 Plan.   [name] will increase dose of Saxenda to 2.4 mg daily.   Exercise goals:  As is.  Behavioral modification strategies: increasing lean protein intake and decreasing simple carbohydrates.  [name] has agreed to follow-up with our clinic in 3 weeks.  Objective:   Blood pressure 133/78, pulse 90, temperature 98.3 F (36.8 C), height 5\' 5"  (1.651 m), weight 253 lb (114.8 kg), SpO2  98 %. Body mass index is 42.1 kg/m.  General: Cooperative, alert, well developed, in no acute distress. HEENT: Conjunctivae and lids unremarkable. Cardiovascular: Regular rhythm.  Lungs: Normal work of breathing. Neurologic: No focal deficits.   Lab Results  Component Value Date   CREATININE 1.13 (H) 03/04/2021   BUN 27 (H) 03/04/2021   NA 141 03/04/2021   K 3.4 (L) 03/04/2021   CL 107 03/04/2021   CO2 23 03/04/2021   Lab Results  Component Value Date   ALT 17 01/06/2021   AST 11 (L) 01/06/2021   ALKPHOS 43 01/06/2021   BILITOT 0.3 01/06/2021   Lab Results  Component Value Date   HGBA1C 5.0 06/13/2020   HGBA1C 5.1 12/21/2019   HGBA1C 5.1 05/04/2019   HGBA1C 5.1 01/29/2019   HGBA1C 5.2 09/08/2018   Lab Results  Component Value Date   INSULIN 18.2 06/13/2020   INSULIN 22.1 12/21/2019   INSULIN 13.2 05/04/2019   INSULIN 16.1 01/29/2019   INSULIN 16.9 09/08/2018   Lab Results  Component Value Date   TSH 2.810 09/15/2020   Lab Results  Component Value Date   CHOL 177 09/15/2020   HDL 58 09/15/2020   LDLCALC 105 (H) 09/15/2020   TRIG 72 09/15/2020   CHOLHDL 3.1 09/15/2020   Lab Results  Component Value Date   VD25OH 60.1 06/13/2020   VD25OH 58.5 12/21/2019   VD25OH  33.0 08/04/2019   Lab Results  Component Value Date   WBC 10.3 03/04/2021   HGB 14.9 03/04/2021   HCT 45.8 03/04/2021   MCV 93.1 03/04/2021   PLT 285 03/04/2021   No results found for: IRON, TIBC, FERRITIN  Attestation Statements:   Reviewed by clinician on day of visit: allergies, medications, problem list, medical history, surgical history, family history, social history, and previous encounter notes.  I, Lizbeth Bark, RMA, am acting as Location manager for [employer], [place].    I have reviewed the above documentation for accuracy and completeness, and I agree with the above. -  Georgianne Fick, FNP

## 2021-05-31 ENCOUNTER — Other Ambulatory Visit: Payer: Self-pay | Admitting: Family

## 2021-05-31 ENCOUNTER — Other Ambulatory Visit: Payer: Self-pay

## 2021-05-31 ENCOUNTER — Ambulatory Visit
Admission: RE | Admit: 2021-05-31 | Discharge: 2021-05-31 | Disposition: A | Discharge status: Home / Self Care | Payer: 59 | Source: Ambulatory Visit | Attending: Family | Admitting: Family

## 2021-05-31 DIAGNOSIS — Z1231 Encounter for screening mammogram for malignant neoplasm of breast: Secondary | ICD-10-CM

## 2021-06-01 ENCOUNTER — Encounter (INDEPENDENT_AMBULATORY_CARE_PROVIDER_SITE_OTHER): Payer: Self-pay | Admitting: Family Medicine

## 2021-06-06 ENCOUNTER — Other Ambulatory Visit (HOSPITAL_COMMUNITY): Payer: Self-pay

## 2021-06-06 MED FILL — Potassium Chloride Microencapsulated Crys ER Tab 20 mEq: ORAL | 90 days supply | Qty: 90 | Fill #1 | Status: AC

## 2021-06-19 ENCOUNTER — Other Ambulatory Visit: Payer: Self-pay

## 2021-06-19 ENCOUNTER — Ambulatory Visit (INDEPENDENT_AMBULATORY_CARE_PROVIDER_SITE_OTHER): Payer: 59 | Admitting: Adult Health

## 2021-06-19 ENCOUNTER — Encounter (INDEPENDENT_AMBULATORY_CARE_PROVIDER_SITE_OTHER): Payer: Self-pay | Admitting: Adult Health

## 2021-06-19 VITALS — BP 145/81 | HR 97 | Temp 97.7°F | Ht 65.0 in | Wt 250.0 lb

## 2021-06-19 DIAGNOSIS — E8881 Metabolic syndrome: Secondary | ICD-10-CM

## 2021-06-19 DIAGNOSIS — I10 Essential (primary) hypertension: Secondary | ICD-10-CM | POA: Diagnosis not present

## 2021-06-19 DIAGNOSIS — Z6841 Body Mass Index (BMI) 40.0 and over, adult: Secondary | ICD-10-CM | POA: Diagnosis not present

## 2021-06-19 NOTE — Progress Notes (Addendum)
Chief Complaint:   OBESITY [name] is here to discuss her progress with her obesity treatment plan along with follow-up of her obesity related diagnoses. [name] is on the Category 3 Plan and states she is following her eating plan approximately 87% of the time. [name] states she is doing cardio and going to the gym 60 minutes 3 times per week.  Today's visit was #: 16 Starting weight: 250 lbs Starting date: 09/08/2018 Today's weight: 250 lbs Today's date: 06/19/2021 Total lbs lost to date: 0 Total lbs lost since last in-office visit: 3  Interim History: [name] will consume muscle milk after a gym workout, protein drink nutritional data:160 calories and 24 grams of protein.  Subjective:   1. Essential hypertension [name] has been up since 0300 "chasing a cat"- cat was found. BP above goal today.  She denies acute cardiac symptoms. Pt has not had her morning Zestoretic 20-12.5 mg QD.  2. Insulin resistance She denies family hx of MTC or personal hx of pancreatitis. Last OV 05/29/21- she was advised to increase Saxenda to 2.4mg  QD She has remained on in Saxenda 1.8 mg QD- denies mass in neck, dysphagia, dyspepsia, or persistent hoarseness. She would like to convert from [name] to [place]- once she finishes her supply of Saxenda.  Assessment/Plan:   1. Essential hypertension [name] is working on healthy weight loss and exercise to improve blood pressure control. We will watch for signs of hypotension as she continues her lifestyle modifications. Continue current treatment plan.  2. Insulin resistance Finish Saxenda 1.8mg  then consider changing to [facility].  3. Obesity with current BMI of 41.7  [name] is currently in the action stage of change. As such, her goal is to continue with weight loss efforts. She has agreed to the Category 3 Plan.   Exercise goals:  As is  Behavioral modification strategies: increasing lean protein intake, decreasing simple carbohydrates, meal planning and  cooking strategies, keeping healthy foods in the home, and planning for success.  [name] has agreed to follow-up with our clinic in 3 weeks. She was informed of the importance of frequent follow-up visits to maximize her success with intensive lifestyle modifications for her multiple health conditions.   Objective:   Blood pressure (!) 145/81, pulse 97, temperature 97.7 F (36.5 C), height 5\' 5"  (1.651 m), weight 250 lb (113.4 kg), SpO2 99 %. Body mass index is 41.6 kg/m.  General: Cooperative, alert, well developed, in no acute distress. HEENT: Conjunctivae and lids unremarkable. Cardiovascular: Regular rhythm.  Lungs: Normal work of breathing. Neurologic: No focal deficits.   Lab Results  Component Value Date   CREATININE 1.13 (H) 03/04/2021   BUN 27 (H) 03/04/2021   NA 141 03/04/2021   K 3.4 (L) 03/04/2021   CL 107 03/04/2021   CO2 23 03/04/2021   Lab Results  Component Value Date   ALT 17 01/06/2021   AST 11 (L) 01/06/2021   ALKPHOS 43 01/06/2021   BILITOT 0.3 01/06/2021   Lab Results  Component Value Date   HGBA1C 5.0 06/13/2020   HGBA1C 5.1 12/21/2019   HGBA1C 5.1 05/04/2019   HGBA1C 5.1 01/29/2019   HGBA1C 5.2 09/08/2018   Lab Results  Component Value Date   INSULIN 18.2 06/13/2020   INSULIN 22.1 12/21/2019   INSULIN 13.2 05/04/2019   INSULIN 16.1 01/29/2019   INSULIN 16.9 09/08/2018   Lab Results  Component Value Date   TSH 2.810 09/15/2020   Lab Results  Component Value Date   CHOL  177 09/15/2020   HDL 58 09/15/2020   LDLCALC 105 (H) 09/15/2020   TRIG 72 09/15/2020   CHOLHDL 3.1 09/15/2020   Lab Results  Component Value Date   VD25OH 60.1 06/13/2020   VD25OH 58.5 12/21/2019   VD25OH 33.0 08/04/2019   Lab Results  Component Value Date   WBC 10.3 03/04/2021   HGB 14.9 03/04/2021   HCT 45.8 03/04/2021   MCV 93.1 03/04/2021   PLT 285 03/04/2021    Attestation Statements:   Reviewed by clinician on day of visit: allergies, medications,  problem list, medical history, surgical history, family history, social history, and previous encounter notes.  Time spent on visit including pre-visit chart review and post-visit care and charting was 28 minutes.   Coral Ceo, CMA, am acting as transcriptionist for Mina Marble, NP.  I have reviewed the above documentation for accuracy and completeness, and I agree with the above. -  Scottie Stanish d. Demonta Wombles, NP-C

## 2021-06-20 ENCOUNTER — Other Ambulatory Visit (HOSPITAL_COMMUNITY): Payer: Self-pay

## 2021-06-20 ENCOUNTER — Other Ambulatory Visit: Payer: Self-pay | Admitting: Family

## 2021-06-20 DIAGNOSIS — J301 Allergic rhinitis due to pollen: Secondary | ICD-10-CM

## 2021-06-20 DIAGNOSIS — J4520 Mild intermittent asthma, uncomplicated: Secondary | ICD-10-CM

## 2021-06-20 MED ORDER — MONTELUKAST SODIUM 10 MG PO TABS
ORAL_TABLET | Freq: Every day | ORAL | 1 refills | Status: DC
  Filled 2021-06-20: qty 90, 90d supply, fill #0
  Filled 2021-09-17: qty 90, 90d supply, fill #1

## 2021-07-10 ENCOUNTER — Ambulatory Visit (INDEPENDENT_AMBULATORY_CARE_PROVIDER_SITE_OTHER): Payer: 59 | Admitting: Adult Health

## 2021-07-10 ENCOUNTER — Other Ambulatory Visit (HOSPITAL_COMMUNITY): Payer: Self-pay

## 2021-07-10 ENCOUNTER — Other Ambulatory Visit: Payer: Self-pay

## 2021-07-10 ENCOUNTER — Encounter (INDEPENDENT_AMBULATORY_CARE_PROVIDER_SITE_OTHER): Payer: Self-pay | Admitting: Adult Health

## 2021-07-10 VITALS — BP 126/80 | HR 85 | Temp 97.5°F | Ht 65.0 in | Wt 254.0 lb

## 2021-07-10 DIAGNOSIS — E8881 Metabolic syndrome: Secondary | ICD-10-CM

## 2021-07-10 DIAGNOSIS — Z6841 Body Mass Index (BMI) 40.0 and over, adult: Secondary | ICD-10-CM | POA: Diagnosis not present

## 2021-07-10 DIAGNOSIS — I10 Essential (primary) hypertension: Secondary | ICD-10-CM | POA: Diagnosis not present

## 2021-07-10 DIAGNOSIS — Z9189 Other specified personal risk factors, not elsewhere classified: Secondary | ICD-10-CM | POA: Diagnosis not present

## 2021-07-10 DIAGNOSIS — E559 Vitamin D deficiency, unspecified: Secondary | ICD-10-CM | POA: Diagnosis not present

## 2021-07-10 DIAGNOSIS — R5383 Other fatigue: Secondary | ICD-10-CM

## 2021-07-10 NOTE — Progress Notes (Addendum)
Chief Complaint:   OBESITY [name] is here to discuss her progress with her obesity treatment plan along with follow-up of her obesity related diagnoses. [name] is on the Category 3 Plan and states she is following her eating plan approximately 90% of the time. [name] states she is doing cardio/weights for 60 minutes 3 times per week.  Today's visit was #: 85 Starting weight: 250 lbs Starting date: 09/08/2018 Today's weight: 254 lbs Today's date: 07/10/2021 Total lbs lost to date: 0 Total lbs lost since last in-office visit: 0  Interim History: Saxenda 1.8 mg QD.  She denies mass in neck, dysphagia, dyspepsia, or persistent hoarseness. Reviewed bioimpedance results with patient: elevated adipose and water.  Subjective:   1. Insulin resistance Both of her parents are diabetic. She is on Saxenda 1.8mg  - denies mass in neck, dysphagia, dyspepsia, or persistent hoarseness.  2. Other fatigue Her mother and sister have hypothyroidism. She reports increased fatigue recently.  3. Essential hypertension BP/HR excellent at office visit.  4. Vitamin D deficiency She is taking OTC vitamin D3 2,000 IU daily.  5. At risk for activity intolerance [name] is at risk for activity intolerance due to having increased fatigue.  Assessment/Plan:   1. Insulin resistance Check labs today.  - Hemoglobin A1c - Insulin, random  2. Other fatigue Will check labs today.  - TSH+T4F+T3Free  3. Essential hypertension Will check labs today.  - Comprehensive metabolic panel - Lipid panel  4. Vitamin D deficiency Check vitamin D level today, as per below.  - VITAMIN D 25 Hydroxy (Vit-D Deficiency, Fractures)  5. At risk for activity intolerance [name] was given approximately 15 minutes of exercise intolerance counseling today. She is 50 y.o. female and has risk factors exercise intolerance including obesity. We discussed intensive lifestyle modifications today with an emphasis on specific weight  loss instructions and strategies. [name] will slowly increase activity as tolerated.  Repetitive spaced learning was employed today to elicit superior memory formation and behavioral change.  6. Obesity with current BMI of 42.3  [name] is currently in the action stage of change. As such, her goal is to continue with weight loss efforts. She has agreed to the Category 3 Plan.   Exercise goals:  As is.  Behavioral modification strategies: increasing lean protein intake, decreasing simple carbohydrates, meal planning and cooking strategies, keeping healthy foods in the home, and planning for success.  [name] has agreed to follow-up with our clinic in 3 weeks. She was informed of the importance of frequent follow-up visits to maximize her success with intensive lifestyle modifications for her multiple health conditions.   [name] was informed we would discuss her lab results at her next visit unless there is a critical issue that needs to be addressed sooner. [name] agreed to keep her next visit at the agreed upon time to discuss these results.  Objective:   Blood pressure 126/80, pulse 85, temperature (!) 97.5 F (36.4 C), height 5\' 5"  (1.651 m), weight 254 lb (115.2 kg), SpO2 99 %. Body mass index is 42.27 kg/m.  General: Cooperative, alert, well developed, in no acute distress. HEENT: Conjunctivae and lids unremarkable. Cardiovascular: Regular rhythm.  Lungs: Normal work of breathing. Neurologic: No focal deficits.   Lab Results  Component Value Date   CREATININE 1.13 (H) 03/04/2021   BUN 27 (H) 03/04/2021   NA 141 03/04/2021   K 3.4 (L) 03/04/2021   CL 107 03/04/2021   CO2 23 03/04/2021   Lab Results  Component  Value Date   ALT 17 01/06/2021   AST 11 (L) 01/06/2021   ALKPHOS 43 01/06/2021   BILITOT 0.3 01/06/2021   Lab Results  Component Value Date   HGBA1C 5.0 06/13/2020   HGBA1C 5.1 12/21/2019   HGBA1C 5.1 05/04/2019   HGBA1C 5.1 01/29/2019   HGBA1C 5.2 09/08/2018   Lab  Results  Component Value Date   INSULIN 18.2 06/13/2020   INSULIN 22.1 12/21/2019   INSULIN 13.2 05/04/2019   INSULIN 16.1 01/29/2019   INSULIN 16.9 09/08/2018   Lab Results  Component Value Date   TSH 2.810 09/15/2020   Lab Results  Component Value Date   CHOL 177 09/15/2020   HDL 58 09/15/2020   LDLCALC 105 (H) 09/15/2020   TRIG 72 09/15/2020   CHOLHDL 3.1 09/15/2020   Lab Results  Component Value Date   VD25OH 60.1 06/13/2020   VD25OH 58.5 12/21/2019   VD25OH 33.0 08/04/2019   Lab Results  Component Value Date   WBC 10.3 03/04/2021   HGB 14.9 03/04/2021   HCT 45.8 03/04/2021   MCV 93.1 03/04/2021   PLT 285 03/04/2021   Attestation Statements:   Reviewed by clinician on day of visit: allergies, medications, problem list, medical history, surgical history, family history, social history, and previous encounter notes.  I, Water quality scientist, CMA, am acting as Location manager for Mina Marble, NP.  I have reviewed the above documentation for accuracy and completeness, and I agree with the above. -  [name] d. Toshiye Kever, NP-C

## 2021-07-11 LAB — COMPREHENSIVE METABOLIC PANEL
ALT: 22 IU/L (ref 0–32)
AST: 13 IU/L (ref 0–40)
Albumin/Globulin Ratio: 1.8 (ref 1.2–2.2)
Albumin: 4.2 g/dL (ref 3.8–4.8)
Alkaline Phosphatase: 43 IU/L — ABNORMAL LOW (ref 44–121)
BUN/Creatinine Ratio: 18 (ref 9–23)
BUN: 14 mg/dL (ref 6–24)
Bilirubin Total: 0.3 mg/dL (ref 0.0–1.2)
CO2: 18 mmol/L — ABNORMAL LOW (ref 20–29)
Calcium: 9.1 mg/dL (ref 8.7–10.2)
Chloride: 107 mmol/L — ABNORMAL HIGH (ref 96–106)
Creatinine, Ser: 0.78 mg/dL (ref 0.57–1.00)
Globulin, Total: 2.4 g/dL (ref 1.5–4.5)
Glucose: 81 mg/dL (ref 70–99)
Potassium: 4.1 mmol/L (ref 3.5–5.2)
Sodium: 138 mmol/L (ref 134–144)
Total Protein: 6.6 g/dL (ref 6.0–8.5)
eGFR: 94 mL/min/{1.73_m2} (ref >59)

## 2021-07-11 LAB — INSULIN, RANDOM: INSULIN: 16 u[IU]/mL (ref 2.6–24.9)

## 2021-07-11 LAB — LIPID PANEL
Chol/HDL Ratio: 3 ratio (ref 0.0–4.4)
Cholesterol, Total: 159 mg/dL (ref 100–199)
HDL: 53 mg/dL (ref >39)
LDL Chol Calc (NIH): 82 mg/dL (ref 0–99)
Triglycerides: 137 mg/dL (ref 0–149)
VLDL Cholesterol Cal: 24 mg/dL (ref 5–40)

## 2021-07-11 LAB — TSH+T4F+T3FREE
Free T4: 1.13 ng/dL (ref 0.82–1.77)
T3, Free: 3 pg/mL (ref 2.0–4.4)
TSH: 2.79 u[IU]/mL (ref 0.450–4.500)

## 2021-07-11 LAB — HEMOGLOBIN A1C
Est. average glucose Bld gHb Est-mCnc: 100 mg/dL
Hgb A1c MFr Bld: 5.1 % (ref 4.8–5.6)

## 2021-07-11 LAB — VITAMIN D 25 HYDROXY (VIT D DEFICIENCY, FRACTURES): Vit D, 25-Hydroxy: 58.8 ng/mL (ref 30.0–100.0)

## 2021-07-31 ENCOUNTER — Other Ambulatory Visit: Payer: Self-pay

## 2021-07-31 ENCOUNTER — Encounter (INDEPENDENT_AMBULATORY_CARE_PROVIDER_SITE_OTHER): Payer: Self-pay | Admitting: Adult Health

## 2021-07-31 ENCOUNTER — Other Ambulatory Visit (HOSPITAL_COMMUNITY): Payer: Self-pay

## 2021-07-31 ENCOUNTER — Ambulatory Visit (INDEPENDENT_AMBULATORY_CARE_PROVIDER_SITE_OTHER): Payer: 59 | Admitting: Adult Health

## 2021-07-31 VITALS — BP 127/80 | HR 87 | Temp 97.7°F | Ht 65.0 in | Wt 254.0 lb

## 2021-07-31 DIAGNOSIS — E559 Vitamin D deficiency, unspecified: Secondary | ICD-10-CM

## 2021-07-31 DIAGNOSIS — E7849 Other hyperlipidemia: Secondary | ICD-10-CM | POA: Diagnosis not present

## 2021-07-31 DIAGNOSIS — E8881 Metabolic syndrome: Secondary | ICD-10-CM

## 2021-07-31 DIAGNOSIS — I10 Essential (primary) hypertension: Secondary | ICD-10-CM | POA: Diagnosis not present

## 2021-07-31 DIAGNOSIS — Z9189 Other specified personal risk factors, not elsewhere classified: Secondary | ICD-10-CM

## 2021-07-31 DIAGNOSIS — Z6839 Body mass index (BMI) 39.0-39.9, adult: Secondary | ICD-10-CM

## 2021-07-31 MED ORDER — SAXENDA 18 MG/3ML ~~LOC~~ SOPN
3.0000 mg | PEN_INJECTOR | Freq: Every day | SUBCUTANEOUS | 0 refills | Status: DC
  Filled 2021-07-31: qty 15, 30d supply, fill #0

## 2021-07-31 NOTE — Progress Notes (Addendum)
Chief Complaint:   OBESITY [name] is here to discuss her progress with her obesity treatment plan along with follow-up of her obesity related diagnoses. [name] is on the Category 3 Plan and states she is following her eating plan approximately 75% of the time. [name] states she is Cardio/Weights 60 minutes 3 times per week.  Today's visit was #: 91 Starting weight: 250 lbs Starting date: 09/08/2018 Today's weight: 254 lbs Today's date: 07/31/2021 Total lbs lost to date: 0 Total lbs lost since last in-office visit: 0  Interim History: [name] did not formally celebrate Thanksgiving - sister brought her "a plate" to her at [employer] during her night shift. Recently [employer] - staff was showered with bagels, pizza - consumed more carbohydrates than typical during this time period.  She is happy to have maintained her weight since her last office visit. She is on Saxenda 1.8mg  QD. She denies mass in neck, dysphagia, dyspepsia, persistent hoarseness, or GI upset.  Subjective:   1. Essential hypertension Discussed labs with patient today.  BP/HR excellent at office visit. She is on Zestoretic 20/12.5 mg daily. On 07/10/2021, CMP - stable.  2. Vitamin D deficiency Discussed labs with patient today.  Vitamin D level on 07/10/2021 - 58.8 - at goal. She is on OTC vitamin D3 1,000 IU daily..   3. Other hyperlipidemia Discussed labs with patient today.  On 07/10/2021, lipid panel excellent. She stopped OTC fish oil - April 2022 - due to starting Eliquis and possible drug/drug interactions.  4. Insulin resistance Discussed labs with patient today.  On 07/10/2021, BG 81, A1c 5.1, insulin level 16.0. She is on Saxenda 1.8mg  QD. She denies mass in neck, dysphagia, dyspepsia, persistent hoarseness, or GI upset.  5. At risk for nausea [name] is at risk for nausea due to being on the maximum dose of Saxenda for obesity.  Assessment/Plan:   1. Essential hypertension Continue Zestoretic  20/12.5 mg daily.  2. Vitamin D deficiency Continue OTC vitamin D3 1,000 IU daily.  3. Other hyperlipidemia Continue to decrease saturated fat.  4. Insulin resistance Decrease carbohydrates.  Continue Saxenda 1.8mg  QD.  5. At risk for nausea [name] was given approximately 15 minutes of nausea prevention counseling today. [name] is at risk for nausea due to her new or current medication. She was encouraged to titrate her medication slowly, make sure to stay hydrated, eat smaller portions throughout the day, and avoid high fat meals.   6. Obesity with current BMI 42.3  - Refill Liraglutide -Weight Management (SAXENDA) 18 MG/3ML SOPN; Inject 3 mg into the skin daily.  Dispense: 15 mL; Refill: 0  [name] is currently in the action stage of change. As such, her goal is to continue with weight loss efforts. She has agreed to the Category 3 Plan.   Exercise goals:  As is.  Behavioral modification strategies: increasing lean protein intake, decreasing simple carbohydrates, meal planning and cooking strategies, keeping healthy foods in the home, and planning for success.  [name] has agreed to follow-up with our clinic in 3 weeks. She was informed of the importance of frequent follow-up visits to maximize her success with intensive lifestyle modifications for her multiple health conditions.   Objective:   Blood pressure 127/80, pulse 87, temperature 97.7 F (36.5 C), height 5\' 5"  (1.651 m), weight 254 lb (115.2 kg), SpO2 98 %. Body mass index is 42.27 kg/m.  General: Cooperative, alert, well developed, in no acute distress. HEENT: Conjunctivae and lids  unremarkable. Cardiovascular: Regular rhythm.  Lungs: Normal work of breathing. Neurologic: No focal deficits.   Lab Results  Component Value Date   CREATININE 0.78 07/10/2021   BUN 14 07/10/2021   NA 138 07/10/2021   K 4.1 07/10/2021   CL 107 (H) 07/10/2021   CO2 18 (L) 07/10/2021   Lab Results  Component Value Date   ALT 22  07/10/2021   AST 13 07/10/2021   ALKPHOS 43 (L) 07/10/2021   BILITOT 0.3 07/10/2021   Lab Results  Component Value Date   HGBA1C 5.1 07/10/2021   HGBA1C 5.0 06/13/2020   HGBA1C 5.1 12/21/2019   HGBA1C 5.1 05/04/2019   HGBA1C 5.1 01/29/2019   Lab Results  Component Value Date   INSULIN 16.0 07/10/2021   INSULIN 18.2 06/13/2020   INSULIN 22.1 12/21/2019   INSULIN 13.2 05/04/2019   INSULIN 16.1 01/29/2019   Lab Results  Component Value Date   TSH 2.790 07/10/2021   Lab Results  Component Value Date   CHOL 159 07/10/2021   HDL 53 07/10/2021   LDLCALC 82 07/10/2021   TRIG 137 07/10/2021   CHOLHDL 3.0 07/10/2021   Lab Results  Component Value Date   VD25OH 58.8 07/10/2021   VD25OH 60.1 06/13/2020   VD25OH 58.5 12/21/2019   Lab Results  Component Value Date   WBC 10.3 03/04/2021   HGB 14.9 03/04/2021   HCT 45.8 03/04/2021   MCV 93.1 03/04/2021   PLT 285 03/04/2021   Attestation Statements:   Reviewed by clinician on day of visit: allergies, medications, problem list, medical history, surgical history, family history, social history, and previous encounter notes.  I, Water quality scientist, CMA, am acting as Location manager for Mina Marble, NP.  I have reviewed the above documentation for accuracy and completeness, and I agree with the above. -  Guynell Kleiber d. Lilya Smitherman, NP-C

## 2021-08-03 ENCOUNTER — Encounter (INDEPENDENT_AMBULATORY_CARE_PROVIDER_SITE_OTHER): Payer: Self-pay

## 2021-08-06 ENCOUNTER — Other Ambulatory Visit: Payer: Self-pay | Admitting: Family

## 2021-08-07 ENCOUNTER — Other Ambulatory Visit (HOSPITAL_COMMUNITY): Payer: Self-pay

## 2021-08-07 MED ORDER — APIXABAN 5 MG PO TABS
5.0000 mg | ORAL_TABLET | Freq: Two times a day (BID) | ORAL | 0 refills | Status: DC
  Filled 2021-08-07: qty 180, 90d supply, fill #0

## 2021-08-20 MED FILL — Potassium Chloride Microencapsulated Crys ER Tab 20 mEq: ORAL | 90 days supply | Qty: 90 | Fill #2 | Status: AC

## 2021-08-20 MED FILL — Acetazolamide Tab 250 MG: ORAL | 90 days supply | Qty: 180 | Fill #2 | Status: AC

## 2021-08-21 ENCOUNTER — Ambulatory Visit (INDEPENDENT_AMBULATORY_CARE_PROVIDER_SITE_OTHER): Payer: 59 | Admitting: Adult Health

## 2021-08-21 ENCOUNTER — Other Ambulatory Visit (HOSPITAL_COMMUNITY): Payer: Self-pay

## 2021-09-11 ENCOUNTER — Other Ambulatory Visit: Payer: Self-pay

## 2021-09-11 ENCOUNTER — Ambulatory Visit (INDEPENDENT_AMBULATORY_CARE_PROVIDER_SITE_OTHER): Payer: 59 | Admitting: Adult Health

## 2021-09-11 ENCOUNTER — Encounter (INDEPENDENT_AMBULATORY_CARE_PROVIDER_SITE_OTHER): Payer: Self-pay | Admitting: Adult Health

## 2021-09-11 VITALS — BP 140/83 | HR 93 | Temp 98.7°F | Ht 65.0 in | Wt 256.0 lb

## 2021-09-11 DIAGNOSIS — E559 Vitamin D deficiency, unspecified: Secondary | ICD-10-CM

## 2021-09-11 DIAGNOSIS — Z6841 Body Mass Index (BMI) 40.0 and over, adult: Secondary | ICD-10-CM | POA: Diagnosis not present

## 2021-09-11 NOTE — Progress Notes (Addendum)
Chief Complaint:   OBESITY Dimples is here to discuss her progress with her obesity treatment plan along with follow-up of her obesity related diagnoses. Nadra is on the Category 3 Plan and states she is following her eating plan approximately 50% of the time. Bree states she is not exercising regularly at this time.  Today's visit was #: 21 Starting weight: 250 lbs Starting date: 09/08/2018 Today's weight: 256 lbs Today's date: 09/11/2021 Total lbs lost to date: 0 Total lbs lost since last in-office visit: 0  Interim History:  Christl is on Saxenda 1.8 mg daily- tolerating well. Home antigen COVID-19 test positive on 08/05/2021 - exposure at work- she did not experience any acute symptoms. He husband needs a cholecystectomy- once this is accomplished she feels that eating on plan will be much easier.  Subjective:   1. Vitamin D deficiency She is on OTC vitamin D3 2000 IU daily. On 07/10/2021, vitamin D level - 58.8 - stable.  Assessment/Plan:   1. Vitamin D deficiency Continue OTC supplement.  2. Obesity with current BMI 42.7  Rondell is currently in the action stage of change. As such, her goal is to continue with weight loss efforts. She has agreed to the Category 3 Plan.   Exercise goals:  Exercise 2 times per week.  Behavioral modification strategies: increasing lean protein intake, decreasing simple carbohydrates, meal planning and cooking strategies, keeping healthy foods in the home, and planning for success.  Marcianne has agreed to follow-up with our clinic in 4 weeks. She was informed of the importance of frequent follow-up visits to maximize her success with intensive lifestyle modifications for her multiple health conditions.   Objective:   Blood pressure 140/83, pulse 93, temperature 98.7 F (37.1 C), height 5\' 5"  (1.651 m), weight 256 lb (116.1 kg), SpO2 98 %. Body mass index is 42.6 kg/m.  General: Cooperative, alert, well developed, in no acute distress. HEENT:  Conjunctivae and lids unremarkable. Cardiovascular: Regular rhythm.  Lungs: Normal work of breathing. Neurologic: No focal deficits.   Lab Results  Component Value Date   CREATININE 0.78 07/10/2021   BUN 14 07/10/2021   NA 138 07/10/2021   K 4.1 07/10/2021   CL 107 (H) 07/10/2021   CO2 18 (L) 07/10/2021   Lab Results  Component Value Date   ALT 22 07/10/2021   AST 13 07/10/2021   ALKPHOS 43 (L) 07/10/2021   BILITOT 0.3 07/10/2021   Lab Results  Component Value Date   HGBA1C 5.1 07/10/2021   HGBA1C 5.0 06/13/2020   HGBA1C 5.1 12/21/2019   HGBA1C 5.1 05/04/2019   HGBA1C 5.1 01/29/2019   Lab Results  Component Value Date   INSULIN 16.0 07/10/2021   INSULIN 18.2 06/13/2020   INSULIN 22.1 12/21/2019   INSULIN 13.2 05/04/2019   INSULIN 16.1 01/29/2019   Lab Results  Component Value Date   TSH 2.790 07/10/2021   Lab Results  Component Value Date   CHOL 159 07/10/2021   HDL 53 07/10/2021   LDLCALC 82 07/10/2021   TRIG 137 07/10/2021   CHOLHDL 3.0 07/10/2021   Lab Results  Component Value Date   VD25OH 58.8 07/10/2021   VD25OH 60.1 06/13/2020   VD25OH 58.5 12/21/2019   Lab Results  Component Value Date   WBC 10.3 03/04/2021   HGB 14.9 03/04/2021   HCT 45.8 03/04/2021   MCV 93.1 03/04/2021   PLT 285 03/04/2021   Attestation Statements:   Reviewed by clinician on day of visit: allergies,  medications, problem list, medical history, surgical history, family history, social history, and previous encounter notes.  Time spent on visit including pre-visit chart review and post-visit care and charting was 27 minutes.   I, Water quality scientist, CMA, am acting as Location manager for Mina Marble, NP.  I have reviewed the above documentation for accuracy and completeness, and I agree with the above. -  Edrie Ehrich d. Jurnie Garritano, NP-C

## 2021-09-18 ENCOUNTER — Ambulatory Visit: Payer: 59 | Admitting: Family

## 2021-09-18 ENCOUNTER — Other Ambulatory Visit (HOSPITAL_COMMUNITY): Payer: Self-pay

## 2021-09-18 ENCOUNTER — Encounter: Payer: Self-pay | Admitting: Family

## 2021-09-18 VITALS — BP 121/79 | HR 82 | Temp 98.1°F | Ht 65.0 in | Wt 261.0 lb

## 2021-09-18 DIAGNOSIS — I10 Essential (primary) hypertension: Secondary | ICD-10-CM | POA: Diagnosis not present

## 2021-09-18 DIAGNOSIS — Z0000 Encounter for general adult medical examination without abnormal findings: Secondary | ICD-10-CM

## 2021-09-18 DIAGNOSIS — E559 Vitamin D deficiency, unspecified: Secondary | ICD-10-CM

## 2021-09-18 DIAGNOSIS — L719 Rosacea, unspecified: Secondary | ICD-10-CM | POA: Diagnosis not present

## 2021-09-18 DIAGNOSIS — Z0001 Encounter for general adult medical examination with abnormal findings: Secondary | ICD-10-CM

## 2021-09-18 DIAGNOSIS — J4520 Mild intermittent asthma, uncomplicated: Secondary | ICD-10-CM

## 2021-09-18 DIAGNOSIS — J301 Allergic rhinitis due to pollen: Secondary | ICD-10-CM

## 2021-09-18 DIAGNOSIS — E7849 Other hyperlipidemia: Secondary | ICD-10-CM | POA: Diagnosis not present

## 2021-09-18 MED ORDER — METRONIDAZOLE 0.75 % EX CREA
TOPICAL_CREAM | Freq: Two times a day (BID) | CUTANEOUS | 1 refills | Status: DC
  Filled 2021-09-18: qty 45, 30d supply, fill #0
  Filled 2022-03-19: qty 45, 30d supply, fill #1

## 2021-09-18 MED ORDER — ALBUTEROL SULFATE HFA 108 (90 BASE) MCG/ACT IN AERS
2.0000 | INHALATION_SPRAY | RESPIRATORY_TRACT | 2 refills | Status: DC | PRN
  Filled 2021-09-18: qty 18, 16d supply, fill #0
  Filled 2022-04-25: qty 6.7, 16d supply, fill #1
  Filled 2022-09-06: qty 6.7, 17d supply, fill #2

## 2021-09-18 NOTE — Patient Instructions (Signed)

## 2021-09-18 NOTE — Progress Notes (Signed)
Subjective:    Patient ID: [name], female    DOB: May 11, 1971, 51 y.o.   MRN: [id number]  Chief Complaint  Patient presents with   Annual Exam    No pap    Pt presents to the office today for CPE without pap. Pt has idiopathic intracranial hypertension and is followed by Dr. Marjory Lies, neurologists annually. She is followed by [facility] ever 3 weeks for morbid obese with a BMI of 43. She states she has lost 17 lbs since starting there.   She has rosacea and uses metronidazole 0.75% as needed.  Hypertension This is a chronic problem. The current episode started more than 1 year ago. The problem has been resolved since onset. The problem is controlled. Associated symptoms include shortness of breath ("Some times with walking after having PE"). Pertinent negatives include no malaise/fatigue or peripheral edema. Risk factors for coronary artery disease include dyslipidemia and obesity. The current treatment provides moderate improvement.  Asthma She complains of shortness of breath ("Some times with walking after having PE"). There is no cough, frequent throat clearing or wheezing. The current episode started more than 1 year ago. The problem has been waxing and waning. Associated symptoms include nasal congestion. Pertinent negatives include no ear congestion, ear pain or malaise/fatigue. Her past medical history is significant for asthma.  Hyperlipidemia The current episode started more than 1 year ago. Exacerbating diseases include obesity. She has no history of hypothyroidism. Associated symptoms include shortness of breath ("Some times with walking after having PE"). Current antihyperlipidemic treatment includes diet change. The current treatment provides mild improvement of lipids. Risk factors for coronary artery disease include dyslipidemia, hypertension and a sedentary lifestyle.     Review of Systems  Constitutional:  Negative for malaise/fatigue.  HENT:  Negative for  ear pain.   Respiratory:  Positive for shortness of breath ("Some times with walking after having PE"). Negative for cough and wheezing.   All other systems reviewed and are negative.  Family History  Problem Relation Age of Onset   Cataracts Mother    Diabetes Mother    Hypertension Mother    High Cholesterol Mother    Obesity Mother    Diabetes Father    Hypertension Father    Obesity Father    Atrial fibrillation Father    Thyroid cancer Maternal Great-grandmother    Lung cancer Paternal Grandmother    Social History   Socioeconomic History   Marital status: Married    Spouse name: Johnny   Number of children: 0   Years of education: 14   Highest education level: Not on file  Occupational History   Occupation: [employer] Theme park manager: [employer]  Tobacco Use   Smoking status: Never   Smokeless tobacco: Never  Vaping Use   Vaping Use: Never used  Substance and Sexual Activity   Alcohol use: No    Alcohol/week: 0.0 standard drinks   Drug use: No   Sexual activity: Not on file  Other Topics Concern   Not on file  Social History Narrative   Married, no children, lives at home   Caffeine use- 2-3 cups coffee 5 days a week   Social Determinants of Corporate investment banker Strain: Not on file  Food Insecurity: Not on file  Transportation Needs: Not on file  Physical Activity: Not on file  Stress: Not on file  Social Connections: Not on file       Objective:  Physical Exam Vitals reviewed.  Constitutional:      General: She is not in acute distress.    Appearance: She is well-developed. She is obese.  HENT:     Head: Normocephalic and atraumatic.     Right Ear: Tympanic membrane normal.     Left Ear: Tympanic membrane normal.  Eyes:     Pupils: Pupils are equal, round, and reactive to light.  Neck:     Thyroid: No thyromegaly.  Cardiovascular:     Rate and Rhythm: Normal rate and regular rhythm.     Heart sounds: Normal heart sounds. No  murmur heard. Pulmonary:     Effort: Pulmonary effort is normal. No respiratory distress.     Breath sounds: Normal breath sounds. No wheezing.  Abdominal:     General: Bowel sounds are normal. There is no distension.     Palpations: Abdomen is soft.     Tenderness: There is no abdominal tenderness.  Musculoskeletal:        General: No tenderness. Normal range of motion.     Cervical back: Normal range of motion and neck supple.  Skin:    General: Skin is warm and dry.  Neurological:     Mental Status: She is alert and oriented to person, place, and time.     Cranial Nerves: No cranial nerve deficit.     Deep Tendon Reflexes: Reflexes are normal and symmetric.  Psychiatric:        Behavior: Behavior normal.        Thought Content: Thought content normal.        Judgment: Judgment normal.         BP 121/79    Pulse 82    Temp 98.1 F (36.7 C) (Temporal)    Ht $R'5\' 5"'zJ$  (1.651 m)    Wt 261 lb (118.4 kg)    BMI 43.43 kg/m   Assessment & Plan:  MARKEESHA CHAR comes in today with chief complaint of Annual Exam (No pap)   Diagnosis and orders addressed:  1. Allergic rhinitis due to pollen, unspecified seasonality  - albuterol (VENTOLIN HFA) 108 (90 Base) MCG/ACT inhaler; Inhale 2 puffs into the lungs every 4 (four) hours as needed for wheezing or shortness of breath.  Dispense: 18 g; Refill: 2 - CMP14+EGFR - CBC with Differential/Platelet  2. Rosacea - metroNIDAZOLE (METROCREAM) 0.75 % cream; Apply topically 2 (two) times daily.  Dispense: 45 g; Refill: 1 - CMP14+EGFR - CBC with Differential/Platelet  3. Essential hypertension - CMP14+EGFR - CBC with Differential/Platelet  4. Mild intermittent asthma without complication  - HTX77+SFSE - CBC with Differential/Platelet  5. Morbid obesity ([place]) - CMP14+EGFR - CBC with Differential/Platelet  6. Other hyperlipidemia - CMP14+EGFR - CBC with Differential/Platelet - Lipid panel  7. Vitamin D deficiency -  CMP14+EGFR - CBC with Differential/Platelet - VITAMIN D 25 Hydroxy (Vit-D Deficiency, Fractures)  8. Annual physical exam - CMP14+EGFR - CBC with Differential/Platelet - Lipid panel - TSH - VITAMIN D 25 Hydroxy (Vit-D Deficiency, Fractures)   Labs pending Health Maintenance reviewed Diet and exercise encouraged  Follow up plan: 6 months   [name], FNP

## 2021-09-19 LAB — CBC WITH DIFFERENTIAL/PLATELET
Basophils Absolute: 0.1 10*3/uL (ref 0.0–0.2)
Basos: 1 %
EOS (ABSOLUTE): 0.2 10*3/uL (ref 0.0–0.4)
Eos: 3 %
Hematocrit: 44.7 % (ref 34.0–46.6)
Hemoglobin: 14.5 g/dL (ref 11.1–15.9)
Immature Grans (Abs): 0 10*3/uL (ref 0.0–0.1)
Immature Granulocytes: 0 %
Lymphocytes Absolute: 2.3 10*3/uL (ref 0.7–3.1)
Lymphs: 30 %
MCH: 29.8 pg (ref 26.6–33.0)
MCHC: 32.4 g/dL (ref 31.5–35.7)
MCV: 92 fL (ref 79–97)
Monocytes Absolute: 0.5 10*3/uL (ref 0.1–0.9)
Monocytes: 6 %
Neutrophils Absolute: 4.5 10*3/uL (ref 1.4–7.0)
Neutrophils: 60 %
Platelets: 239 10*3/uL (ref 150–450)
RBC: 4.87 x10E6/uL (ref 3.77–5.28)
RDW: 13.1 % (ref 11.7–15.4)
WBC: 7.5 10*3/uL (ref 3.4–10.8)

## 2021-09-19 LAB — LIPID PANEL
Chol/HDL Ratio: 3.1 ratio (ref 0.0–4.4)
Cholesterol, Total: 175 mg/dL (ref 100–199)
HDL: 56 mg/dL (ref >39)
LDL Chol Calc (NIH): 90 mg/dL (ref 0–99)
Triglycerides: 173 mg/dL — ABNORMAL HIGH (ref 0–149)
VLDL Cholesterol Cal: 29 mg/dL (ref 5–40)

## 2021-09-19 LAB — CMP14+EGFR
ALT: 20 IU/L (ref 0–32)
AST: 13 IU/L (ref 0–40)
Albumin/Globulin Ratio: 1.8 (ref 1.2–2.2)
Albumin: 4.5 g/dL (ref 3.8–4.8)
Alkaline Phosphatase: 49 IU/L (ref 44–121)
BUN/Creatinine Ratio: 21 (ref 9–23)
BUN: 16 mg/dL (ref 6–24)
Bilirubin Total: 0.2 mg/dL (ref 0.0–1.2)
CO2: 19 mmol/L — ABNORMAL LOW (ref 20–29)
Calcium: 9.6 mg/dL (ref 8.7–10.2)
Chloride: 107 mmol/L — ABNORMAL HIGH (ref 96–106)
Creatinine, Ser: 0.78 mg/dL (ref 0.57–1.00)
Globulin, Total: 2.5 g/dL (ref 1.5–4.5)
Glucose: 93 mg/dL (ref 70–99)
Potassium: 4.5 mmol/L (ref 3.5–5.2)
Sodium: 138 mmol/L (ref 134–144)
Total Protein: 7 g/dL (ref 6.0–8.5)
eGFR: 94 mL/min/{1.73_m2} (ref >59)

## 2021-09-19 LAB — TSH: TSH: 2.39 u[IU]/mL (ref 0.450–4.500)

## 2021-09-19 LAB — VITAMIN D 25 HYDROXY (VIT D DEFICIENCY, FRACTURES): Vit D, 25-Hydroxy: 64.1 ng/mL (ref 30.0–100.0)

## 2021-10-05 ENCOUNTER — Other Ambulatory Visit (HOSPITAL_COMMUNITY): Payer: Self-pay

## 2021-10-05 ENCOUNTER — Other Ambulatory Visit: Payer: Self-pay | Admitting: Family

## 2021-10-05 DIAGNOSIS — G932 Benign intracranial hypertension: Secondary | ICD-10-CM

## 2021-10-05 DIAGNOSIS — I10 Essential (primary) hypertension: Secondary | ICD-10-CM

## 2021-10-05 MED ORDER — LISINOPRIL-HYDROCHLOROTHIAZIDE 20-12.5 MG PO TABS
1.0000 | ORAL_TABLET | Freq: Every day | ORAL | 1 refills | Status: DC
  Filled 2021-10-05: qty 90, 90d supply, fill #0
  Filled 2022-01-09: qty 90, 90d supply, fill #1

## 2021-10-09 ENCOUNTER — Other Ambulatory Visit (HOSPITAL_COMMUNITY): Payer: Self-pay

## 2021-10-09 ENCOUNTER — Other Ambulatory Visit: Payer: Self-pay

## 2021-10-09 ENCOUNTER — Ambulatory Visit (INDEPENDENT_AMBULATORY_CARE_PROVIDER_SITE_OTHER): Payer: 59 | Admitting: Adult Health

## 2021-10-09 ENCOUNTER — Encounter (INDEPENDENT_AMBULATORY_CARE_PROVIDER_SITE_OTHER): Payer: Self-pay | Admitting: Adult Health

## 2021-10-09 VITALS — BP 125/75 | HR 95 | Temp 97.8°F | Ht 65.0 in | Wt 259.0 lb

## 2021-10-09 DIAGNOSIS — E669 Obesity, unspecified: Secondary | ICD-10-CM | POA: Diagnosis not present

## 2021-10-09 DIAGNOSIS — Z9189 Other specified personal risk factors, not elsewhere classified: Secondary | ICD-10-CM

## 2021-10-09 DIAGNOSIS — I10 Essential (primary) hypertension: Secondary | ICD-10-CM | POA: Diagnosis not present

## 2021-10-09 DIAGNOSIS — E559 Vitamin D deficiency, unspecified: Secondary | ICD-10-CM | POA: Diagnosis not present

## 2021-10-09 DIAGNOSIS — E7849 Other hyperlipidemia: Secondary | ICD-10-CM | POA: Diagnosis not present

## 2021-10-09 DIAGNOSIS — Z6839 Body mass index (BMI) 39.0-39.9, adult: Secondary | ICD-10-CM

## 2021-10-09 DIAGNOSIS — Z6841 Body Mass Index (BMI) 40.0 and over, adult: Secondary | ICD-10-CM

## 2021-10-09 MED ORDER — SAXENDA 18 MG/3ML ~~LOC~~ SOPN
2.4000 mg | PEN_INJECTOR | Freq: Every day | SUBCUTANEOUS | 0 refills | Status: DC
  Filled 2021-10-09: qty 15, 37d supply, fill #0

## 2021-10-09 NOTE — Progress Notes (Signed)
Chief Complaint:   OBESITY [name] is here to discuss her progress with her obesity treatment plan along with follow-up of her obesity related diagnoses. [name] is on the Category 3 Plan and states she is following her eating plan approximately 75% of the time. [name] states she is doing cardio for 45 minutes 2 times per week.  Today's visit was #: 39 Starting weight: 250 lbs Starting date: 09/08/2018 Today's weight: 259 lbs Today's date: 10/09/2021 Total lbs lost to date: 0 Total lbs lost since last in-office visit: 0  Interim History:  [name] is on Saxenda 1.8 mg daily - been on this dose since summer/fall 2022.  She was instructed to increase Saxenda to 2.4 mg on 05/29/2021 - however, she has remained on 1.8 mg dose.  Subjective:   1. Essential hypertension She is on Zestoretic 20/12.5 mg daily. She denies acute cardiac symptoms. On 09/18/2021 - CMP - electrolytes/kidney function - WNL.  2. Vitamin D deficiency Discussed labs with patient today.  Vitamin D level on 09/18/2021 - 64.1 - stable. She is on OTC vitamin D3 2000 IU daily.  3. Other hyperlipidemia Discussed labs with patient today.  PCP checked lipid panel on 09/18/2021.  TGs elevated - 173. Per patient, she was not fasting.  4. At risk for constipation [name] is at increased risk for constipation due to increasing Saxenda.  Assessment/Plan:   1. Essential hypertension Continue daily Zestoretic 20/12.5 mg.  2. Vitamin D deficiency Continue OTC supplement daily.  3. Other hyperlipidemia Recheck fasting labs in a few months.  4. At risk for constipation [name] was given approximately 15 minutes of counseling today regarding prevention of constipation. She was encouraged to increase water and fiber intake.   5. Obesity with current BMI 42.3  - Increase  and refill Liraglutide -Weight Management (SAXENDA) 18 MG/3ML SOPN; Inject 2.4 mg into the skin daily.  Dispense: 15 mL; Refill: 0  [name] is currently in the action  stage of change. As such, her goal is to continue with weight loss efforts. She has agreed to the Category 3 Plan.   Exercise goals:  As is.  Behavioral modification strategies: increasing lean protein intake, decreasing simple carbohydrates, meal planning and cooking strategies, keeping healthy foods in the home, and planning for success.  [name] has agreed to follow-up with our clinic in 4 weeks. She was informed of the importance of frequent follow-up visits to maximize her success with intensive lifestyle modifications for her multiple health conditions.   Objective:   Blood pressure 125/75, pulse 95, temperature 97.8 F (36.6 C), height 5\' 5"  (1.651 m), weight 259 lb (117.5 kg), SpO2 98 %. Body mass index is 43.1 kg/m.  General: Cooperative, alert, well developed, in no acute distress. HEENT: Conjunctivae and lids unremarkable. Cardiovascular: Regular rhythm.  Lungs: Normal work of breathing. Neurologic: No focal deficits.   Lab Results  Component Value Date   CREATININE 0.78 09/18/2021   BUN 16 09/18/2021   NA 138 09/18/2021   K 4.5 09/18/2021   CL 107 (H) 09/18/2021   CO2 19 (L) 09/18/2021   Lab Results  Component Value Date   ALT 20 09/18/2021   AST 13 09/18/2021   ALKPHOS 49 09/18/2021   BILITOT 0.2 09/18/2021   Lab Results  Component Value Date   HGBA1C 5.1 07/10/2021   HGBA1C 5.0 06/13/2020   HGBA1C 5.1 12/21/2019   HGBA1C 5.1 05/04/2019   HGBA1C 5.1 01/29/2019   Lab Results  Component Value Date  INSULIN 16.0 07/10/2021   INSULIN 18.2 06/13/2020   INSULIN 22.1 12/21/2019   INSULIN 13.2 05/04/2019   INSULIN 16.1 01/29/2019   Lab Results  Component Value Date   TSH 2.390 09/18/2021   Lab Results  Component Value Date   CHOL 175 09/18/2021   HDL 56 09/18/2021   LDLCALC 90 09/18/2021   TRIG 173 (H) 09/18/2021   CHOLHDL 3.1 09/18/2021   Lab Results  Component Value Date   VD25OH 64.1 09/18/2021   VD25OH 58.8 07/10/2021   VD25OH 60.1  06/13/2020   Lab Results  Component Value Date   WBC 7.5 09/18/2021   HGB 14.5 09/18/2021   HCT 44.7 09/18/2021   MCV 92 09/18/2021   PLT 239 09/18/2021   Attestation Statements:   Reviewed by clinician on day of visit: allergies, medications, problem list, medical history, surgical history, family history, social history, and previous encounter notes.  I, Water quality scientist, CMA, am acting as Location manager for Mina Marble, NP.  I have reviewed the above documentation for accuracy and completeness, and I agree with the above. -  Diamon Reddinger d. Drucilla Cumber, NP-C

## 2021-10-10 ENCOUNTER — Other Ambulatory Visit (HOSPITAL_COMMUNITY): Payer: Self-pay

## 2021-10-12 ENCOUNTER — Other Ambulatory Visit (HOSPITAL_COMMUNITY): Payer: Self-pay

## 2021-10-20 ENCOUNTER — Other Ambulatory Visit (HOSPITAL_COMMUNITY): Payer: Self-pay

## 2021-10-25 ENCOUNTER — Other Ambulatory Visit (HOSPITAL_COMMUNITY): Payer: Self-pay

## 2021-10-27 ENCOUNTER — Other Ambulatory Visit (HOSPITAL_COMMUNITY): Payer: Self-pay

## 2021-10-29 ENCOUNTER — Other Ambulatory Visit: Payer: Self-pay | Admitting: Family

## 2021-10-29 MED ORDER — APIXABAN 5 MG PO TABS
5.0000 mg | ORAL_TABLET | Freq: Two times a day (BID) | ORAL | 0 refills | Status: DC
  Filled 2021-10-29: qty 180, 90d supply, fill #0

## 2021-10-30 ENCOUNTER — Other Ambulatory Visit (HOSPITAL_COMMUNITY): Payer: Self-pay

## 2021-11-03 ENCOUNTER — Other Ambulatory Visit (HOSPITAL_COMMUNITY): Payer: Self-pay

## 2021-11-06 ENCOUNTER — Other Ambulatory Visit: Payer: Self-pay

## 2021-11-06 ENCOUNTER — Ambulatory Visit (INDEPENDENT_AMBULATORY_CARE_PROVIDER_SITE_OTHER): Payer: 59 | Admitting: Adult Health

## 2021-11-06 ENCOUNTER — Encounter (INDEPENDENT_AMBULATORY_CARE_PROVIDER_SITE_OTHER): Payer: Self-pay | Admitting: Adult Health

## 2021-11-06 ENCOUNTER — Other Ambulatory Visit (HOSPITAL_COMMUNITY): Payer: Self-pay

## 2021-11-06 VITALS — BP 136/71 | HR 92 | Temp 97.6°F | Ht 65.0 in | Wt 257.0 lb

## 2021-11-06 DIAGNOSIS — Z6831 Body mass index (BMI) 31.0-31.9, adult: Secondary | ICD-10-CM | POA: Diagnosis not present

## 2021-11-06 DIAGNOSIS — E669 Obesity, unspecified: Secondary | ICD-10-CM

## 2021-11-06 DIAGNOSIS — Z9189 Other specified personal risk factors, not elsewhere classified: Secondary | ICD-10-CM | POA: Diagnosis not present

## 2021-11-06 DIAGNOSIS — I10 Essential (primary) hypertension: Secondary | ICD-10-CM | POA: Diagnosis not present

## 2021-11-06 DIAGNOSIS — E7849 Other hyperlipidemia: Secondary | ICD-10-CM

## 2021-11-06 MED ORDER — SAXENDA 18 MG/3ML ~~LOC~~ SOPN
3.0000 mg | PEN_INJECTOR | Freq: Every day | SUBCUTANEOUS | 0 refills | Status: DC
  Filled 2021-11-06: qty 15, 30d supply, fill #0

## 2021-11-06 MED ORDER — INSULIN PEN NEEDLE 31G X 6 MM MISC
0 refills | Status: DC
  Filled 2021-11-06: qty 100, 90d supply, fill #0

## 2021-11-07 NOTE — Progress Notes (Signed)
? ? ? ?Chief Complaint:  ? ?OBESITY ?[name] is here to discuss her progress with her obesity treatment plan along with follow-up of her obesity related diagnoses. [name] is on the Category 3 Plan and states she is following her eating plan approximately 75% of the time. [name] states she is doing cardio for 40-45 minutes 2 times per week. ? ?Today's visit was #: 56 ?Starting weight: 250 lbs ?Starting date: 09/08/2018 ?Today's weight: 257 lbs ?Today's date: 11/06/2021 ?Total lbs lost to date: 0 ?Total lbs lost since last in-office visit: 0 ? ?Interim History:  ?Current work situation- ?Full time [employer] employee quit. 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 ?Life stressors- ?Close family friend passed away in 10/29/21. ?Cousin suffering rare genetic neurological disorder (49 years old) - she has three children, ages ages 26, 28, and 50. ? ?Subjective:  ? ?1. Essential hypertension ?BP/HR stable at office visit. ?She is on Zestoretic 20/12.5 mg daily - managed by PCP. ?She denies acute cardiac sx's. ? ?2. Other hyperlipidemia ?09/18/21 Lipid panel- stable with elevated TGs- 173 ? ?3. At risk for heart disease ?[name] is at higher than average risk for cardiovascular disease due to hyperlipidemia and obesity. ? ?Assessment/Plan:  ? ?1. Essential hypertension ?Continue Zestoretic 20-12.5 mg daily. ? ?2. Other hyperlipidemia ?Check lipid panel - fasting - spring 2023. ? ?3. At risk for heart disease ?Aleira was given approximately 15 minutes of coronary artery disease prevention counseling today. She is 51 y.o. female and has risk factors for heart disease including obesity. We discussed intensive lifestyle modifications today with an emphasis on specific weight loss instructions and strategies. ? ?Repetitive spaced learning was employed today to elicit superior memory formation and behavioral change.  ? ?4. Obesity with current BMI 42.8 ? ?- Refill Liraglutide -Weight Management (SAXENDA) 18 MG/3ML SOPN; Inject 3 mg  into the skin daily.  Dispense: 15 mL; Refill: 0 ? ?[name] is currently in the action stage of change. As such, her goal is to continue with weight loss efforts. She has agreed to the Category 3 Plan.  ? ?Exercise goals:  As is. ? ?Behavioral modification strategies: increasing lean protein intake, decreasing simple carbohydrates, meal planning and cooking strategies, keeping healthy foods in the home, and planning for success. ? ?Merie has agreed to follow-up with our clinic in 4 weeks. She was informed of the importance of frequent follow-up visits to maximize her success with intensive lifestyle modifications for her multiple health conditions.  ? ?Objective:  ? ?Blood pressure 136/71, pulse 92, temperature 97.6 ?F (36.4 ?C), height '5\' 5"'$  (1.651 m), weight 257 lb (116.6 kg), SpO2 99 %. ?Body mass index is 42.77 kg/m?. ? ?General: Cooperative, alert, well developed, in no acute distress. ?HEENT: Conjunctivae and lids unremarkable. ?Cardiovascular: Regular rhythm.  ?Lungs: Normal work of breathing. ?Neurologic: No focal deficits.  ? ?Lab Results  ?Component Value Date  ? CREATININE 0.78 09/18/2021  ? BUN 16 09/18/2021  ? NA 138 09/18/2021  ? K 4.5 09/18/2021  ? CL 107 (H) 09/18/2021  ? CO2 19 (L) 09/18/2021  ? ?Lab Results  ?Component Value Date  ? ALT 20 09/18/2021  ? AST 13 09/18/2021  ? ALKPHOS 49 09/18/2021  ? BILITOT 0.2 09/18/2021  ? ?Lab Results  ?Component Value Date  ? HGBA1C 5.1 07/10/2021  ? HGBA1C 5.0 06/13/2020  ? HGBA1C 5.1 12/21/2019  ? HGBA1C 5.1 05/04/2019  ? HGBA1C 5.1 01/29/2019  ? ?Lab Results  ?Component Value Date  ? INSULIN 16.0  07/10/2021  ? INSULIN 18.2 06/13/2020  ? INSULIN 22.1 12/21/2019  ? INSULIN 13.2 05/04/2019  ? INSULIN 16.1 01/29/2019  ? ?Lab Results  ?Component Value Date  ? TSH 2.390 09/18/2021  ? ?Lab Results  ?Component Value Date  ? CHOL 175 09/18/2021  ? HDL 56 09/18/2021  ? [name] 90 09/18/2021  ? TRIG 173 (H) 09/18/2021  ? CHOLHDL 3.1 09/18/2021  ? ?Lab Results  ?Component  Value Date  ? VD25OH 64.1 09/18/2021  ? VD25OH 58.8 07/10/2021  ? VD25OH 60.1 06/13/2020  ? ?Lab Results  ?Component Value Date  ? WBC 7.5 09/18/2021  ? HGB 14.5 09/18/2021  ? HCT 44.7 09/18/2021  ? MCV 92 09/18/2021  ? PLT 239 09/18/2021  ? ?Attestation Statements:  ? ?Reviewed by clinician on day of visit: allergies, medications, problem list, medical history, surgical history, family history, social history, and previous encounter notes. ? ?I, Water quality scientist, CMA, am acting as Location manager for Mina Marble, NP. ? ?I have reviewed the above documentation for accuracy and completeness, and I agree with the above. -  Nyxon Strupp d. Stanislaus Kaltenbach, NP-C ?

## 2021-11-19 ENCOUNTER — Other Ambulatory Visit: Payer: Self-pay

## 2021-11-20 ENCOUNTER — Other Ambulatory Visit (HOSPITAL_COMMUNITY): Payer: Self-pay

## 2021-11-20 ENCOUNTER — Other Ambulatory Visit: Payer: Self-pay

## 2021-11-20 MED ORDER — ACETAZOLAMIDE 250 MG PO TABS
ORAL_TABLET | ORAL | 0 refills | Status: DC
  Filled 2021-11-20: qty 180, 90d supply, fill #0

## 2021-11-26 ENCOUNTER — Other Ambulatory Visit: Payer: Self-pay | Admitting: Cardiology

## 2021-11-27 ENCOUNTER — Other Ambulatory Visit (HOSPITAL_COMMUNITY): Payer: Self-pay

## 2021-11-27 MED ORDER — POTASSIUM CHLORIDE CRYS ER 20 MEQ PO TBCR
20.0000 meq | EXTENDED_RELEASE_TABLET | Freq: Every day | ORAL | 0 refills | Status: DC
  Filled 2021-11-27: qty 90, 90d supply, fill #0
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
## 2021-11-28 NOTE — Patient Instructions (Signed)
Below is our plan: ? ?We will wean Diamox. Decrease dose to '250mg'$  daily for 2 weeks then '250mg'$  every other day for two weeks. I will call to check in in about 3 months. Call me if you need me sooner! ? ?Please make sure you are staying well hydrated. I recommend 50-60 ounces daily. Well balanced diet and regular exercise encouraged. Consistent sleep schedule with 6-8 hours recommended.  ? ?Please continue follow up with care team as directed.  ? ?Follow up with me in 1 year  ? ?You may receive a survey regarding today's visit. I encourage you to leave honest feed back as I do use this information to improve patient care. Thank you for seeing me today!  ? ? ?

## 2021-11-29 ENCOUNTER — Encounter: Payer: Self-pay | Admitting: Family Medicine

## 2021-11-29 ENCOUNTER — Ambulatory Visit: Payer: 59 | Admitting: Family Medicine

## 2021-11-29 VITALS — BP 141/86 | HR 84 | Ht 65.0 in | Wt 263.5 lb

## 2021-11-29 DIAGNOSIS — G932 Benign intracranial hypertension: Secondary | ICD-10-CM | POA: Diagnosis not present

## 2021-12-04 ENCOUNTER — Encounter (INDEPENDENT_AMBULATORY_CARE_PROVIDER_SITE_OTHER): Payer: Self-pay | Admitting: Adult Health

## 2021-12-04 ENCOUNTER — Ambulatory Visit (INDEPENDENT_AMBULATORY_CARE_PROVIDER_SITE_OTHER): Payer: 59 | Admitting: Adult Health

## 2021-12-04 VITALS — BP 132/81 | HR 93 | Temp 97.6°F | Ht 65.0 in | Wt 257.0 lb

## 2021-12-04 DIAGNOSIS — G932 Benign intracranial hypertension: Secondary | ICD-10-CM

## 2021-12-04 DIAGNOSIS — E669 Obesity, unspecified: Secondary | ICD-10-CM | POA: Diagnosis not present

## 2021-12-04 DIAGNOSIS — Z6839 Body mass index (BMI) 39.0-39.9, adult: Secondary | ICD-10-CM

## 2021-12-04 DIAGNOSIS — Z6841 Body Mass Index (BMI) 40.0 and over, adult: Secondary | ICD-10-CM | POA: Diagnosis not present

## 2021-12-04 DIAGNOSIS — J301 Allergic rhinitis due to pollen: Secondary | ICD-10-CM

## 2021-12-04 DIAGNOSIS — E66812 Obesity, class 2: Secondary | ICD-10-CM

## 2021-12-06 NOTE — Progress Notes (Signed)
? ? ? ?Chief Complaint:  ? ?OBESITY ?[name] is here to discuss her progress with her obesity treatment plan along with follow-up of her obesity related diagnoses. [name] is on the Category 3 Plan and states she is following her eating plan approximately 60-65% of the time. [name] states she is walking for 20 minutes 2 times per week. ? ?Today's visit was #: 57 ?Starting weight: 250 lbs ?Starting date:09/08/2018 ?Today's weight: 257 lbs ?Today's date: 12/07/2021  ?Total lbs lost to date: 0 lbs ?Total lbs lost since last in-office visit: 0 lbs ? ?Interim History:  ?[name] recently increased her Saxenda therapy: ?On 10/09/2021. Saxenda 2.4 mg daily. ?On 11/06/2021, Saxenda 3 mg daily. ?She has been on Saxenda in various strengths since 2021.   ?She has been more consistent over the last 3 months with the daily GLP-1 therapy. ? ?Subjective:  ? ?1. Idiopathic intracranial hypertension ?On 11/29/2021,  Neuro OV: ?"Fallynn has tolerated decreased dose of acetazolamide. Headaches have resolved and no vision changes. Eye exam reportedly normal. She has been diagnosed with kidney stones and passed first stone in 03/2021. She wishes to wean Diamox at this time. I will have her decrease dose by 50% every two weeks then discontinue. She will call for any worsening symptoms or changes on ophthalmology exam. I will call to check in on response in 3 months. We will follow up in 1 year. She verbalizes understanding and agreement with this plan". ? ?She reports an increase in "behind my eyes."  No change in vision in the last 24 hours. ? ?2. Allergic rhinitis due to pollen, unspecified seasonality ?She denies acute URI symptoms at the moment. ?She is on Singulair 10 mg daily, echinacea 125 mg daily, Zyrtec 5 mg daily, and albuterol as needed. ? ?Assessment/Plan:  ? ?1. Idiopathic intracranial hypertension ?Monitor sx's. ?Follow-up with Neurology PRN and as directed. ? ?2. Allergic rhinitis due to pollen, unspecified seasonality ?Continue current  treatment regimen, increase water intake. ? ?3. Obesity with current BMI 42.8 ? ?[name] is currently in the action stage of change. As such, her goal is to continue with weight loss efforts. She has agreed to the Category 3 Plan.  ? ?Exercise goals:  As is. ? ?Behavioral modification strategies: increasing lean protein intake, decreasing simple carbohydrates, meal planning and cooking strategies, keeping healthy foods in the home, better snacking choices, and planning for success. ? ?[name] has agreed to follow-up with our clinic in 4 weeks. She was informed of the importance of frequent follow-up visits to maximize her success with intensive lifestyle modifications for her multiple health conditions.  ? ?Objective:  ? ?Blood pressure 132/81, pulse 93, temperature 97.6 ?F (36.4 ?C), height '5\' 5"'$  (1.651 m), weight 257 lb (116.6 kg), SpO2 98 %. ?Body mass index is 42.77 kg/m?. ? ?General: Cooperative, alert, well developed, in no acute distress. ?HEENT: Conjunctivae and lids unremarkable. ?Cardiovascular: Regular rhythm.  ?Lungs: Normal work of breathing. ?Neurologic: No focal deficits.  ? ?Lab Results  ?Component Value Date  ? CREATININE 0.78 09/18/2021  ? BUN 16 09/18/2021  ? NA 138 09/18/2021  ? K 4.5 09/18/2021  ? CL 107 (H) 09/18/2021  ? CO2 19 (L) 09/18/2021  ? ?Lab Results  ?Component Value Date  ? ALT 20 09/18/2021  ? AST 13 09/18/2021  ? ALKPHOS 49 09/18/2021  ? BILITOT 0.2 09/18/2021  ? ?Lab Results  ?Component Value Date  ? HGBA1C 5.1 07/10/2021  ? HGBA1C 5.0 06/13/2020  ? HGBA1C 5.1 12/21/2019  ? HGBA1C  5.1 05/04/2019  ? HGBA1C 5.1 01/29/2019  ? ?Lab Results  ?Component Value Date  ? INSULIN 16.0 07/10/2021  ? INSULIN 18.2 06/13/2020  ? INSULIN 22.1 12/21/2019  ? INSULIN 13.2 05/04/2019  ? INSULIN 16.1 01/29/2019  ? ?Lab Results  ?Component Value Date  ? TSH 2.390 09/18/2021  ? ?Lab Results  ?Component Value Date  ? CHOL 175 09/18/2021  ? HDL 56 09/18/2021  ? [place] 90 09/18/2021  ? TRIG 173 (H) 09/18/2021  ?  CHOLHDL 3.1 09/18/2021  ? ?Lab Results  ?Component Value Date  ? VD25OH 64.1 09/18/2021  ? VD25OH 58.8 07/10/2021  ? VD25OH 60.1 06/13/2020  ? ?Lab Results  ?Component Value Date  ? WBC 7.5 09/18/2021  ? HGB 14.5 09/18/2021  ? HCT 44.7 09/18/2021  ? MCV 92 09/18/2021  ? PLT 239 09/18/2021  ? ?Attestation Statements:  ? ?Reviewed by clinician on day of visit: allergies, medications, problem list, medical history, surgical history, family history, social history, and previous encounter notes. ? ?Time spent on visit including pre-visit chart review and post-visit care and charting was 27 minutes.  ? ?I, Water quality scientist, CMA, am acting as Location manager for Mina Marble, NP. ? ?I have reviewed the above documentation for accuracy and completeness, and I agree with the above. -  Latausha Flamm d. Leopold Smyers, NP-C ? ?

## 2021-12-08 ENCOUNTER — Encounter: Payer: Self-pay | Admitting: Family Medicine

## 2021-12-21 ENCOUNTER — Other Ambulatory Visit: Payer: Self-pay | Admitting: Family

## 2021-12-21 ENCOUNTER — Other Ambulatory Visit (HOSPITAL_COMMUNITY): Payer: Self-pay

## 2021-12-21 DIAGNOSIS — J4520 Mild intermittent asthma, uncomplicated: Secondary | ICD-10-CM

## 2021-12-21 DIAGNOSIS — J301 Allergic rhinitis due to pollen: Secondary | ICD-10-CM

## 2021-12-21 MED ORDER — MONTELUKAST SODIUM 10 MG PO TABS
ORAL_TABLET | Freq: Every day | ORAL | 1 refills | Status: DC
  Filled 2021-12-21: qty 90, 90d supply, fill #0
  Filled 2022-03-19: qty 90, 90d supply, fill #1

## 2022-01-01 ENCOUNTER — Ambulatory Visit (INDEPENDENT_AMBULATORY_CARE_PROVIDER_SITE_OTHER): Payer: 59 | Admitting: Adult Health

## 2022-01-05 ENCOUNTER — Telehealth: Payer: Self-pay | Admitting: Physician Assistant

## 2022-01-05 ENCOUNTER — Other Ambulatory Visit: Payer: Self-pay | Admitting: *Deleted

## 2022-01-05 ENCOUNTER — Encounter: Payer: Self-pay | Admitting: Physician Assistant

## 2022-01-05 NOTE — Telephone Encounter (Signed)
.  Called patient to schedule appointment per 5/5 secure chat, patient is aware of date and time.   ?

## 2022-01-09 ENCOUNTER — Other Ambulatory Visit: Payer: Self-pay | Admitting: Physician Assistant

## 2022-01-09 ENCOUNTER — Inpatient Hospital Stay: Payer: 59 | Attending: Physician Assistant

## 2022-01-09 ENCOUNTER — Other Ambulatory Visit: Payer: Self-pay

## 2022-01-09 ENCOUNTER — Other Ambulatory Visit (HOSPITAL_COMMUNITY): Payer: Self-pay

## 2022-01-09 DIAGNOSIS — Z86718 Personal history of other venous thrombosis and embolism: Secondary | ICD-10-CM | POA: Diagnosis not present

## 2022-01-09 DIAGNOSIS — Z86711 Personal history of pulmonary embolism: Secondary | ICD-10-CM | POA: Diagnosis not present

## 2022-01-09 DIAGNOSIS — I2699 Other pulmonary embolism without acute cor pulmonale: Secondary | ICD-10-CM

## 2022-01-09 DIAGNOSIS — Z7901 Long term (current) use of anticoagulants: Secondary | ICD-10-CM | POA: Insufficient documentation

## 2022-01-09 LAB — CBC WITH DIFFERENTIAL (CANCER CENTER ONLY)
Abs Immature Granulocytes: 0.02 10*3/uL (ref 0.00–0.07)
Basophils Absolute: 0.1 10*3/uL (ref 0.0–0.1)
Basophils Relative: 1 %
Eosinophils Absolute: 0.3 10*3/uL (ref 0.0–0.5)
Eosinophils Relative: 3 %
HCT: 41.1 % (ref 36.0–46.0)
Hemoglobin: 13.6 g/dL (ref 12.0–15.0)
Immature Granulocytes: 0 %
Lymphocytes Relative: 25 %
Lymphs Abs: 2.3 10*3/uL (ref 0.7–4.0)
MCH: 30.2 pg (ref 26.0–34.0)
MCHC: 33.1 g/dL (ref 30.0–36.0)
MCV: 91.1 fL (ref 80.0–100.0)
Monocytes Absolute: 0.6 10*3/uL (ref 0.1–1.0)
Monocytes Relative: 6 %
Neutro Abs: 6 10*3/uL (ref 1.7–7.7)
Neutrophils Relative %: 65 %
Platelet Count: 214 10*3/uL (ref 150–400)
RBC: 4.51 MIL/uL (ref 3.87–5.11)
RDW: 13 % (ref 11.5–15.5)
WBC Count: 9.3 10*3/uL (ref 4.0–10.5)
nRBC: 0 % (ref 0.0–0.2)

## 2022-01-09 LAB — CMP (CANCER CENTER ONLY)
ALT: 27 U/L (ref 0–44)
AST: 15 U/L (ref 15–41)
Albumin: 4.1 g/dL (ref 3.5–5.0)
Alkaline Phosphatase: 40 U/L (ref 38–126)
Anion gap: 11 (ref 5–15)
BUN: 17 mg/dL (ref 6–20)
CO2: 19 mmol/L — ABNORMAL LOW (ref 22–32)
Calcium: 9 mg/dL (ref 8.9–10.3)
Chloride: 107 mmol/L (ref 98–111)
Creatinine: 0.73 mg/dL (ref 0.44–1.00)
GFR, Estimated: >60 mL/min (ref >60)
Glucose, Bld: 89 mg/dL (ref 70–99)
Potassium: 3.6 mmol/L (ref 3.5–5.1)
Sodium: 137 mmol/L (ref 135–145)
Total Bilirubin: 0.6 mg/dL (ref 0.3–1.2)
Total Protein: 7.3 g/dL (ref 6.5–8.1)

## 2022-01-10 ENCOUNTER — Inpatient Hospital Stay (HOSPITAL_BASED_OUTPATIENT_CLINIC_OR_DEPARTMENT_OTHER): Payer: 59 | Admitting: Physician Assistant

## 2022-01-10 VITALS — BP 133/65 | HR 83 | Temp 98.1°F | Resp 20 | Wt 268.3 lb

## 2022-01-10 DIAGNOSIS — Z86711 Personal history of pulmonary embolism: Secondary | ICD-10-CM

## 2022-01-10 DIAGNOSIS — I825Z1 Chronic embolism and thrombosis of unspecified deep veins of right distal lower extremity: Secondary | ICD-10-CM | POA: Diagnosis not present

## 2022-01-10 DIAGNOSIS — Z86718 Personal history of other venous thrombosis and embolism: Secondary | ICD-10-CM | POA: Diagnosis not present

## 2022-01-10 DIAGNOSIS — Z7901 Long term (current) use of anticoagulants: Secondary | ICD-10-CM | POA: Diagnosis not present

## 2022-01-10 NOTE — Progress Notes (Signed)
?[place] ?[phone]   [phone] ? ?PROGRESS NOTE ? ?Patient Care Team: ?Sharion Balloon, FNP as PCP - General (Family Medicine) ? ?Hematological/Oncological History ?1) 12/12/2020-12/14/2020: Admitted for bilateral pulmonary emboli with saddle embolus with CT evidence of right heart strain. Ultrasound of lower extremities confirmed right popliteal vein DVT. Patient was treated with heparin drip for 48 hours.  Echocardiogram showed EF of 65 to 70% and findings of RV overload related to pulmonary embolism. Patient was discharged on Eliquis10 mg twice daily for for 7 days followed by 5 mg twice daily. ? ?2) 01/06/2021: Establish care with Dede Query PA-C ? ?CHIEF COMPLAINTS/PURPOSE OF CONSULTATION:  ?"Pulmonary embolism " ? ?HISTORY OF PRESENTING ILLNESS:  ?[name] 51 y.o. female returns for a follow up for history of pulmonary embolism while on anticoagulation.  ? ?On exam today, Ms. Yasin reports that she is doing well without any significant limitations.  She is tolerating Eliquis therapy without any side effects including easy bruising or active bleeding.  She has occasional episodes of nosebleeds that seasonal and is unchanged since starting anticoagulation.  Her energy levels are stable and she continues to work as a Archivist.  Her appetite is good and she denies any weight loss.  She denies any GI symptoms including nausea, vomiting, diarrhea or constipation.  Her shortness of breath has improved since her original diagnosis of a PE but adds that it is not back to her baseline.  Patient denies any fevers, chills, night sweats, chest pain,cough, edema or skin changes. She has no other complaints. Rest of the 10 point ROS is below.  ? ?MEDICAL HISTORY:  ?Past Medical History:  ?Diagnosis Date  ? Asthma   ? Chiari I malformation ([place])   ? Chiari malformation 1996  ? decompression   ? High blood pressure   ? Hypertension   ? Idiopathic intracranial hypertension    ? Lactose intolerance   ? Pseudotumor cerebri   ? Subclavian bypass stenosis ([place]) 11/97  ? right  ? Swallowing difficulty   ? ? ?SURGICAL HISTORY: ?Past Surgical History:  ?Procedure Laterality Date  ? APPENDECTOMY    ? chiari decompression    ? GANGLION CYST EXCISION Right   ? wrist  ? LAPAROSCOPIC APPENDECTOMY N/A 10/28/2015  ? Procedure: APPENDECTOMY LAPAROSCOPIC;  Surgeon: Armandina Gemma, MD;  Location: [facility];  Service: General;  Laterality: N/A;  ? SUBCLAVIAN BYPASS GRAFT Right 07/1996  ? WRIST FRACTURE SURGERY Right 02/2007  ? ? ?SOCIAL HISTORY: ?Social History  ? ?Socioeconomic History  ? Marital status: Married  ?  Spouse name: Charlotte Crumb  ? Number of children: 0  ? Years of education: 75  ? Highest education level: Not on file  ?Occupational History  ? Occupation: CT tech  ?  Employer: [employer]  ?Tobacco Use  ? Smoking status: Never  ? Smokeless tobacco: Never  ?Vaping Use  ? Vaping Use: Never used  ?Substance and Sexual Activity  ? Alcohol use: No  ?  Alcohol/week: 0.0 standard drinks  ? Drug use: No  ? Sexual activity: Not on file  ?Other Topics Concern  ? Not on file  ?Social History Narrative  ? Married, no children, lives at home  ? Caffeine use- 2-3 cups coffee 5 days a week  ? ?Social Determinants of Health  ? ?Financial Resource Strain: Not on file  ?Food Insecurity: Not on file  ?Transportation Needs: Not on file  ?Physical Activity: Not on file  ?  Stress: Not on file  ?Social Connections: Not on file  ?Intimate Partner Violence: Not on file  ? ? ?FAMILY HISTORY: ?Family History  ?Problem Relation Age of Onset  ? Cataracts Mother   ? Diabetes Mother   ? Hypertension Mother   ? High Cholesterol Mother   ? Obesity Mother   ? Diabetes Father   ? Hypertension Father   ? Obesity Father   ? Atrial fibrillation Father   ? Thyroid cancer Maternal Great-grandmother   ? Lung cancer Paternal Grandmother   ? ? ?ALLERGIES:  is allergic to codeine, lyrica [pregabalin], nyquil multi-symptom  [pseudoeph-doxylamine-dm-apap], and sulfa antibiotics. ? ?MEDICATIONS:  ?Current Outpatient Medications  ?Medication Sig Dispense Refill  ? albuterol (VENTOLIN HFA) 108 (90 Base) MCG/ACT inhaler Inhale 2 puffs into the lungs every 4 (four) hours as needed for wheezing or shortness of breath. 18 g 2  ? apixaban (ELIQUIS) 5 MG TABS tablet Take 1 tablet ('5mg'$ ) by mouth twice daily. 180 tablet 0  ? cetirizine (ZYRTEC) 5 MG tablet Take 5 mg by mouth daily.    ? cholecalciferol (VITAMIN D3) 25 MCG (1000 UT) tablet Take 2,000 Units by mouth daily.     ? Cyanocobalamin (VITAMIN B 12 PO) Take 5,000 mcg by mouth daily.     ? Echinacea 125 MG CAPS Take 125 mg by mouth daily.     ? Insulin Pen Needle (UNIFINE PENTIPS) 32G X 4 MM MISC USE WITH SAXENDA TO INJECT INTO SKIN DAILY 100 each 0  ? Insulin Pen Needle 31G X 6 MM MISC Use as directed with Saxenda. 100 each 0  ? Liraglutide -Weight Management (SAXENDA) 18 MG/3ML SOPN Inject 3 mg into the skin daily. 15 mL 0  ? lisinopril-hydrochlorothiazide (ZESTORETIC) 20-12.5 MG tablet TAKE 1 TABLET BY MOUTH ONCE A DAY 90 tablet 1  ? Magnesium 250 MG TABS Take 250 mg by mouth daily.     ? metroNIDAZOLE (METROCREAM) 0.75 % cream Apply topically 2 (two) times daily. 45 g 1  ? montelukast (SINGULAIR) 10 MG tablet TAKE 1 TABLET BY MOUTH AT BEDTIME 90 tablet 1  ? Multiple Vitamin (MULTIVITAMIN) capsule Take 1 capsule by mouth daily.    ? Oxymetazoline HCl (NASAL SPRAY NA) Place 2 sprays into the nose as needed (CONGESTION).     ? potassium chloride SA (KLOR-CON M) 20 MEQ tablet Take 1 tablet (20 mEq total) by mouth daily. Please schedule appt for future refills. 1st attempt 90 tablet 0  ? ?No current facility-administered medications for this visit.  ? ? ?REVIEW OF SYSTEMS:   ?Constitutional: ( - ) fevers, ( - )  chills , ( - ) night sweats ?Eyes: ( - ) blurriness of vision, ( - ) double vision, ( - ) watery eyes ?Ears, nose, mouth, throat, and face: ( - ) mucositis, ( - ) sore  throat ?Respiratory: ( - ) cough, ( +) dyspnea, ( - ) wheezes ?Cardiovascular: ( - ) palpitation, ( - ) chest discomfort, ( - ) lower extremity swelling ?Gastrointestinal:  ( - ) nausea, ( - ) heartburn, ( - ) change in bowel habits ?Skin: ( - ) abnormal skin rashes ?Lymphatics: ( - ) new lymphadenopathy, ( - ) easy bruising ?Neurological: ( - ) numbness, ( - ) tingling, ( - ) new weaknesses ?Behavioral/Psych: ( - ) mood change, ( - ) new changes  ?All other systems were reviewed with the patient and are negative. ? ?PHYSICAL EXAMINATION: ?ECOG PERFORMANCE STATUS: 1 - Symptomatic but  completely ambulatory ? ?Vitals:  ? 01/10/22 0851  ?BP: 133/65  ?Pulse: 83  ?Resp: 20  ?Temp: 98.1 ?F (36.7 ?C)  ?SpO2: 99%  ? ? ?Filed Weights  ? 01/10/22 0851  ?Weight: 268 lb 4.8 oz (121.7 kg)  ? ? ? ?GENERAL: well appearing female in NAD  ?SKIN: skin color, texture, turgor are normal, no rashes or significant lesions ?EYES: conjunctiva are pink and non-injected, sclera clear ?LUNGS: clear to auscultation and percussion with normal breathing effort ?HEART: regular rate & rhythm and no murmurs and no lower extremity edema ?Musculoskeletal: no cyanosis of digits and no clubbing  ?PSYCH: alert & oriented x 3, fluent speech ?NEURO: no focal motor/sensory deficits ? ?LABORATORY DATA:  ?I have reviewed the data as listed ? ?  Latest Ref Rng & Units 01/09/2022  ?  8:33 AM 09/18/2021  ?  9:59 AM 03/04/2021  ?  6:45 PM  ?CBC  ?WBC 4.0 - 10.5 K/uL 9.3   7.5   10.3    ?Hemoglobin 12.0 - 15.0 g/dL 13.6   14.5   14.9    ?Hematocrit 36.0 - 46.0 % 41.1   44.7   45.8    ?Platelets 150 - 400 K/uL 214   239   285    ? ? ? ?  Latest Ref Rng & Units 01/09/2022  ?  8:33 AM 09/18/2021  ?  9:59 AM 07/10/2021  ?  1:03 PM  ?CMP  ?Glucose 70 - 99 mg/dL 89   93   81    ?BUN 6 - 20 mg/dL '17   16   14    '$ ?Creatinine 0.44 - 1.00 mg/dL 0.73   0.78   0.78    ?Sodium 135 - 145 mmol/L 137   138   138    ?Potassium 3.5 - 5.1 mmol/L 3.6   4.5   4.1    ?Chloride 98 - 111 mmol/L  107   107   107    ?CO2 22 - 32 mmol/L '19   19   18    '$ ?Calcium 8.9 - 10.3 mg/dL 9.0   9.6   9.1    ?Total Protein 6.5 - 8.1 g/dL 7.3   7.0   6.6    ?Total Bilirubin 0.3 - 1.2 mg/dL 0.6   0.2   0.3    ?Alkaline Phos 38 - 126 U/L 40

## 2022-01-11 ENCOUNTER — Telehealth: Payer: Self-pay | Admitting: Hematology and Oncology

## 2022-01-11 NOTE — Telephone Encounter (Signed)
Scheduled per 5/10 los, calender has been mailed for appt in one year ?

## 2022-01-23 ENCOUNTER — Ambulatory Visit (INDEPENDENT_AMBULATORY_CARE_PROVIDER_SITE_OTHER): Payer: 59 | Admitting: Nurse Practitioner

## 2022-01-23 ENCOUNTER — Encounter (INDEPENDENT_AMBULATORY_CARE_PROVIDER_SITE_OTHER): Payer: Self-pay | Admitting: Nurse Practitioner

## 2022-01-23 VITALS — BP 135/81 | HR 90 | Temp 97.9°F | Ht 65.0 in | Wt 262.0 lb

## 2022-01-23 DIAGNOSIS — I10 Essential (primary) hypertension: Secondary | ICD-10-CM

## 2022-01-23 DIAGNOSIS — Z6841 Body Mass Index (BMI) 40.0 and over, adult: Secondary | ICD-10-CM

## 2022-01-23 DIAGNOSIS — E669 Obesity, unspecified: Secondary | ICD-10-CM

## 2022-01-28 ENCOUNTER — Other Ambulatory Visit: Payer: Self-pay | Admitting: Family

## 2022-01-30 ENCOUNTER — Other Ambulatory Visit (HOSPITAL_COMMUNITY): Payer: Self-pay

## 2022-01-30 MED ORDER — APIXABAN 5 MG PO TABS
5.0000 mg | ORAL_TABLET | Freq: Two times a day (BID) | ORAL | 0 refills | Status: DC
Start: 2022-01-30 — End: 2022-04-29
  Filled 2022-01-30: qty 180, 90d supply, fill #0

## 2022-01-31 NOTE — Progress Notes (Signed)
Chief Complaint:   OBESITY [name] is here to discuss her progress with her obesity treatment plan along with follow-up of her obesity related diagnoses. [name] is on the Category 3 Plan and states she is following her eating plan approximately 70% of the time. [name] states she is walking on treadmill 40 minutes 3 times per week.  Today's visit was #: 2 Starting weight: 250 lbs Starting date: 09/08/2018 Today's weight: 262 lbs Today's date: 01/23/2022 Total lbs lost to date: 0 Total lbs lost since last in-office visit: 0  Interim History: [name]'s highest weight was 275 lbs. She works in the [employer] from 10:30pm-8:30am 4 days per week. She struggles with eating and staying on plan while working. Pt feels she needs to "do better with my eating." She has been eating fast foods more due to her work schedule. Her husband has been bringing home more fast food for dinner. Pt has not been able to go to the gym on a regular basis. She is taking Saxenda 3 mg and denies side effects. Pt notes some hunger.  Subjective:   1. Essential hypertension [name] is taking Zestoretic 20-12.5 mg. [name] denies chest pain/palpitations.  Assessment/Plan:   1. Essential hypertension [name] is working on healthy weight loss and exercise to improve blood pressure control. We will watch for signs of hypotension as she continues her lifestyle modifications. Continue to follow up with PCP. Continue medication as directed.  2. Obesity with current BMI 43.7 [name] is currently in the action stage of change. As such, her goal is to continue with weight loss efforts. She has agreed to the Category 3 Plan.   Exercise goals:  As is  Behavioral modification strategies: increasing lean protein intake, increasing water intake, no skipping meals, meal planning and cooking strategies, and planning for success.  [name] has agreed to follow-up with our clinic in 3 weeks. She was informed of the importance of frequent follow-up visits to  maximize her success with intensive lifestyle modifications for her multiple health conditions.   Objective:   Blood pressure 135/81, pulse 90, temperature 97.9 F (36.6 C), height '5\' 5"'$  (1.651 m), weight 262 lb (118.8 kg), SpO2 99 %. Body mass index is 43.6 kg/m.  General: Cooperative, alert, well developed, in no acute distress. HEENT: Conjunctivae and lids unremarkable. Cardiovascular: Regular rhythm.  Lungs: Normal work of breathing. Neurologic: No focal deficits.   Lab Results  Component Value Date   CREATININE 0.73 01/09/2022   BUN 17 01/09/2022   NA 137 01/09/2022   K 3.6 01/09/2022   CL 107 01/09/2022   CO2 19 (L) 01/09/2022   Lab Results  Component Value Date   ALT 27 01/09/2022   AST 15 01/09/2022   ALKPHOS 40 01/09/2022   BILITOT 0.6 01/09/2022   Lab Results  Component Value Date   HGBA1C 5.1 07/10/2021   HGBA1C 5.0 06/13/2020   HGBA1C 5.1 12/21/2019   HGBA1C 5.1 05/04/2019   HGBA1C 5.1 01/29/2019   Lab Results  Component Value Date   INSULIN 16.0 07/10/2021   INSULIN 18.2 06/13/2020   INSULIN 22.1 12/21/2019   INSULIN 13.2 05/04/2019   INSULIN 16.1 01/29/2019   Lab Results  Component Value Date   TSH 2.390 09/18/2021   Lab Results  Component Value Date   CHOL 175 09/18/2021   HDL 56 09/18/2021   LDLCALC 90 09/18/2021   TRIG 173 (H) 09/18/2021   CHOLHDL 3.1 09/18/2021   Lab Results  Component Value Date  VD25OH 64.1 09/18/2021   VD25OH 58.8 07/10/2021   VD25OH 60.1 06/13/2020   Lab Results  Component Value Date   WBC 9.3 01/09/2022   HGB 13.6 01/09/2022   HCT 41.1 01/09/2022   MCV 91.1 01/09/2022   PLT 214 01/09/2022    Attestation Statements:   Reviewed by clinician on day of visit: allergies, medications, problem list, medical history, surgical history, family history, social history, and previous encounter notes.  Time spent on visit including pre-visit chart review and post-visit care and charting was 30 minutes.   I,  Kathlene November, BS, CMA, am acting as transcriptionist for Everardo Pacific, FNP.  I have reviewed the above documentation for accuracy and completeness, and I agree with the above. Everardo Pacific, FNP

## 2022-02-12 ENCOUNTER — Other Ambulatory Visit: Payer: Self-pay | Admitting: Family Medicine

## 2022-02-12 ENCOUNTER — Encounter: Payer: Self-pay | Admitting: Family Medicine

## 2022-02-12 ENCOUNTER — Other Ambulatory Visit (HOSPITAL_COMMUNITY): Payer: Self-pay

## 2022-02-12 MED ORDER — ACETAZOLAMIDE 250 MG PO TABS
250.0000 mg | ORAL_TABLET | Freq: Two times a day (BID) | ORAL | 0 refills | Status: DC
  Filled 2022-02-12: qty 180, 90d supply, fill #0

## 2022-02-19 ENCOUNTER — Ambulatory Visit (INDEPENDENT_AMBULATORY_CARE_PROVIDER_SITE_OTHER): Payer: 59 | Admitting: Nurse Practitioner

## 2022-02-19 ENCOUNTER — Encounter (INDEPENDENT_AMBULATORY_CARE_PROVIDER_SITE_OTHER): Payer: Self-pay | Admitting: Nurse Practitioner

## 2022-02-19 VITALS — BP 117/74 | HR 91 | Temp 98.3°F | Ht 65.0 in | Wt 262.0 lb

## 2022-02-19 DIAGNOSIS — E669 Obesity, unspecified: Secondary | ICD-10-CM | POA: Diagnosis not present

## 2022-02-19 DIAGNOSIS — J301 Allergic rhinitis due to pollen: Secondary | ICD-10-CM

## 2022-02-19 DIAGNOSIS — Z6841 Body Mass Index (BMI) 40.0 and over, adult: Secondary | ICD-10-CM

## 2022-02-20 NOTE — Progress Notes (Signed)
Chief Complaint:   OBESITY [name] is here to discuss her progress with her obesity treatment plan along with follow-up of her obesity related diagnoses. [name] is on the Category 3 Plan and states she is following her eating plan approximately 45% of the time. [name] states she is exercising 0 minutes 0 times per week.  Today's visit was #: 47 Starting weight: 250 lbs Starting date: 09/08/2018 Today's weight: 262 lbs Today's date: 02/19/2022 Total lbs lost to date: 0 lbs Total lbs lost since last in-office visit: 0  Interim History: [name] has maintained her weight. Her niece passed away after last office visit. She is currently taking Saxenda but only taken it once or twice since her last visit. Notes that she needs to get back on track and plans to restart Saxenda. She has not been drinking enough water, sleeping well or following a meal plan since her niece passed away.  She is scheduled for CPE and labs in July.  Subjective:   1. Allergic rhinitis due to pollen, unspecified seasonality [name] is taking Singular 10 mg, Zyrtec 5 mg and Nasonex. Struggling with allergies due to her sister smoking.    Assessment/Plan:   1. Allergic rhinitis due to pollen, unspecified seasonality [name] will follow up with PCP and continue medications as directed.  2. Obesity with current BMI 43.6 [name] is currently in the action stage of change. As such, her goal is to continue with weight loss efforts. She has agreed to the Category 3 Plan.   [name] will restart Saxenda 0.6 mg x1 week, then 1.2 mg x1 week, then 1.8 mg x1 week and then 2.4 mg until next visit. Increase as tolerated.   Exercise goals: All adults should avoid inactivity. Some physical activity is better than none, and adults who participate in any amount of physical activity gain some health benefits.  Behavioral modification strategies: increasing lean protein intake, increasing water intake, and planning for success.  [name] has agreed to  follow-up with our clinic in 4 weeks. She was informed of the importance of frequent follow-up visits to maximize her success with intensive lifestyle modifications for her multiple health conditions.   Objective:   Blood pressure 117/74, pulse 91, temperature 98.3 F (36.8 C), height '5\' 5"'$  (1.651 m), weight 262 lb (118.8 kg), SpO2 97 %. Body mass index is 43.6 kg/m.  General: Cooperative, alert, well developed, in no acute distress. HEENT: Conjunctivae and lids unremarkable. Cardiovascular: Regular rhythm.  Lungs: Normal work of breathing. Neurologic: No focal deficits.   Lab Results  Component Value Date   CREATININE 0.73 01/09/2022   BUN 17 01/09/2022   NA 137 01/09/2022   K 3.6 01/09/2022   CL 107 01/09/2022   CO2 19 (L) 01/09/2022   Lab Results  Component Value Date   ALT 27 01/09/2022   AST 15 01/09/2022   ALKPHOS 40 01/09/2022   BILITOT 0.6 01/09/2022   Lab Results  Component Value Date   HGBA1C 5.1 07/10/2021   HGBA1C 5.0 06/13/2020   HGBA1C 5.1 12/21/2019   HGBA1C 5.1 05/04/2019   HGBA1C 5.1 01/29/2019   Lab Results  Component Value Date   INSULIN 16.0 07/10/2021   INSULIN 18.2 06/13/2020   INSULIN 22.1 12/21/2019   INSULIN 13.2 05/04/2019   INSULIN 16.1 01/29/2019   Lab Results  Component Value Date   TSH 2.390 09/18/2021   Lab Results  Component Value Date   CHOL 175 09/18/2021   HDL 56 09/18/2021   [place]  90 09/18/2021   TRIG 173 (H) 09/18/2021   CHOLHDL 3.1 09/18/2021   Lab Results  Component Value Date   VD25OH 64.1 09/18/2021   VD25OH 58.8 07/10/2021   VD25OH 60.1 06/13/2020   Lab Results  Component Value Date   WBC 9.3 01/09/2022   HGB 13.6 01/09/2022   HCT 41.1 01/09/2022   MCV 91.1 01/09/2022   PLT 214 01/09/2022   No results found for: "IRON", "TIBC", "FERRITIN"  Attestation Statements:   Reviewed by clinician on day of visit: allergies, medications, problem list, medical history, surgical history, family history, social  history, and previous encounter notes.  Time spent on visit including pre-visit chart review and post-visit care and charting was 30 minutes.   I, Brendell Tyus, RMA, am acting as transcriptionist for Everardo Pacific, FNP..  I have reviewed the above documentation for accuracy and completeness, and I agree with the above. Everardo Pacific, FNP

## 2022-02-25 ENCOUNTER — Other Ambulatory Visit: Payer: Self-pay | Admitting: Cardiology

## 2022-02-27 ENCOUNTER — Other Ambulatory Visit: Payer: Self-pay | Admitting: Cardiology

## 2022-02-27 ENCOUNTER — Encounter: Payer: Self-pay | Admitting: Family Medicine

## 2022-02-27 ENCOUNTER — Other Ambulatory Visit (HOSPITAL_COMMUNITY): Payer: Self-pay

## 2022-02-27 MED FILL — Potassium Chloride Microencapsulated Crys ER Tab 20 mEq: ORAL | 30 days supply | Qty: 30 | Fill #0 | Status: AC

## 2022-03-13 ENCOUNTER — Other Ambulatory Visit (HOSPITAL_COMMUNITY): Payer: Self-pay

## 2022-03-19 ENCOUNTER — Other Ambulatory Visit (HOSPITAL_COMMUNITY): Payer: Self-pay

## 2022-03-19 ENCOUNTER — Ambulatory Visit (INDEPENDENT_AMBULATORY_CARE_PROVIDER_SITE_OTHER): Payer: 59 | Admitting: Nurse Practitioner

## 2022-03-19 ENCOUNTER — Encounter: Payer: Self-pay | Admitting: Family

## 2022-03-19 ENCOUNTER — Encounter (INDEPENDENT_AMBULATORY_CARE_PROVIDER_SITE_OTHER): Payer: Self-pay | Admitting: Nurse Practitioner

## 2022-03-19 ENCOUNTER — Encounter (INDEPENDENT_AMBULATORY_CARE_PROVIDER_SITE_OTHER): Payer: Self-pay

## 2022-03-19 ENCOUNTER — Ambulatory Visit: Payer: 59 | Admitting: Family

## 2022-03-19 VITALS — BP 136/79 | HR 89 | Temp 98.3°F | Ht 65.0 in | Wt 261.0 lb

## 2022-03-19 VITALS — BP 116/69 | HR 93 | Temp 98.3°F | Ht 65.0 in | Wt 265.4 lb

## 2022-03-19 DIAGNOSIS — G932 Benign intracranial hypertension: Secondary | ICD-10-CM | POA: Diagnosis not present

## 2022-03-19 DIAGNOSIS — Z5181 Encounter for therapeutic drug level monitoring: Secondary | ICD-10-CM

## 2022-03-19 DIAGNOSIS — E559 Vitamin D deficiency, unspecified: Secondary | ICD-10-CM

## 2022-03-19 DIAGNOSIS — Z6839 Body mass index (BMI) 39.0-39.9, adult: Secondary | ICD-10-CM

## 2022-03-19 DIAGNOSIS — Z1211 Encounter for screening for malignant neoplasm of colon: Secondary | ICD-10-CM

## 2022-03-19 DIAGNOSIS — Z7901 Long term (current) use of anticoagulants: Secondary | ICD-10-CM

## 2022-03-19 DIAGNOSIS — E7849 Other hyperlipidemia: Secondary | ICD-10-CM | POA: Diagnosis not present

## 2022-03-19 DIAGNOSIS — Z86711 Personal history of pulmonary embolism: Secondary | ICD-10-CM

## 2022-03-19 DIAGNOSIS — L719 Rosacea, unspecified: Secondary | ICD-10-CM | POA: Diagnosis not present

## 2022-03-19 DIAGNOSIS — I10 Essential (primary) hypertension: Secondary | ICD-10-CM

## 2022-03-19 DIAGNOSIS — J4520 Mild intermittent asthma, uncomplicated: Secondary | ICD-10-CM

## 2022-03-19 NOTE — Patient Instructions (Signed)

## 2022-03-19 NOTE — Progress Notes (Signed)
Subjective:    Patient ID: [name], female    DOB: November 20, 1970, 51 y.o.   MRN: [id number]  Chief Complaint  Patient presents with   Medical Management of Chronic Issues   Pt presents to the office today for chronic follow up. Pt has idiopathic intracranial hypertension and is followed by Dr. Leta Baptist, neurologists annually. She is followed by Weight management ever 3 weeks for morbid obese with a BMI of 44. She states she has lost 17 lbs since starting there. States her weight is at a stand still. States last month she lost niece and has been hard.     She has rosacea and uses metronidazole 0.75% as needed.   She has hx of PE and taking Eliquis. Has intermittent SOB and followed by Hematologist.  Hypertension This is a chronic problem. The current episode started more than 1 year ago. The problem has been resolved since onset. The problem is controlled. Pertinent negatives include no malaise/fatigue, peripheral edema or shortness of breath. Risk factors for coronary artery disease include dyslipidemia and obesity. The current treatment provides moderate improvement. There is no history of CVA or heart failure.  Asthma There is no cough, shortness of breath or wheezing. This is a chronic problem. The current episode started more than 1 year ago. The problem occurs intermittently. Pertinent negatives include no malaise/fatigue. Her symptoms are alleviated by rest. She reports moderate improvement on treatment. Her past medical history is significant for asthma.  Hyperlipidemia This is a chronic problem. The current episode started more than 1 year ago. Exacerbating diseases include obesity. Pertinent negatives include no shortness of breath. Current antihyperlipidemic treatment includes diet change. The current treatment provides no improvement of lipids. Risk factors for coronary artery disease include dyslipidemia, hypertension and a sedentary lifestyle.      Review of Systems   Constitutional:  Negative for malaise/fatigue.  Respiratory:  Negative for cough, shortness of breath and wheezing.   All other systems reviewed and are negative.      Objective:   Physical Exam Vitals reviewed.  Constitutional:      General: She is not in acute distress.    Appearance: She is well-developed. She is obese.  HENT:     Head: Normocephalic and atraumatic.     Right Ear: Tympanic membrane normal.     Left Ear: Tympanic membrane normal.  Eyes:     Pupils: Pupils are equal, round, and reactive to light.  Neck:     Thyroid: No thyromegaly.  Cardiovascular:     Rate and Rhythm: Normal rate and regular rhythm.     Heart sounds: Normal heart sounds. No murmur heard. Pulmonary:     Effort: Pulmonary effort is normal. No respiratory distress.     Breath sounds: Normal breath sounds. No wheezing.  Abdominal:     General: Bowel sounds are normal. There is no distension.     Palpations: Abdomen is soft.     Tenderness: There is no abdominal tenderness.  Musculoskeletal:        General: No tenderness. Normal range of motion.     Cervical back: Normal range of motion and neck supple.  Skin:    General: Skin is warm and dry.  Neurological:     Mental Status: She is alert and oriented to person, place, and time.     Cranial Nerves: No cranial nerve deficit.     Deep Tendon Reflexes: Reflexes are normal and symmetric.  Psychiatric:  Behavior: Behavior normal.        Thought Content: Thought content normal.        Judgment: Judgment normal.       BP 116/69   Pulse 93   Temp 98.3 F (36.8 C)   Ht '5\' 5"'$  (1.651 m)   Wt 265 lb 6.4 oz (120.4 kg)   SpO2 97%   BMI 44.16 kg/m      Assessment & Plan:  [name] comes in today with chief complaint of Medical Management of Chronic Issues   Diagnosis and orders addressed:  1. Essential hypertension  2. Mild intermittent asthma without complication  3. Rosacea  4. History of pulmonary embolus  (PE)  5. Idiopathic intracranial hypertension   6. Other hyperlipidemia - Lipid panel  7. Morbid obesity ([place])  8. Vitamin D deficiency  9. Anticoagulation management encounter  10. Colon cancer screening - Ambulatory referral to Gastroenterology   Labs pending, labs reviewed from [place] Maintenance reviewed Diet and exercise encouraged  Follow up plan: 6 months   [name], FNP

## 2022-03-20 LAB — LIPID PANEL
Chol/HDL Ratio: 3.5 ratio (ref 0.0–4.4)
Cholesterol, Total: 161 mg/dL (ref 100–199)
HDL: 46 mg/dL (ref >39)
LDL Chol Calc (NIH): 89 mg/dL (ref 0–99)
Triglycerides: 151 mg/dL — ABNORMAL HIGH (ref 0–149)
VLDL Cholesterol Cal: 26 mg/dL (ref 5–40)

## 2022-04-01 ENCOUNTER — Other Ambulatory Visit: Payer: Self-pay | Admitting: Family

## 2022-04-01 DIAGNOSIS — G932 Benign intracranial hypertension: Secondary | ICD-10-CM

## 2022-04-01 DIAGNOSIS — I10 Essential (primary) hypertension: Secondary | ICD-10-CM

## 2022-04-01 MED FILL — Potassium Chloride Microencapsulated Crys ER Tab 20 mEq: ORAL | 30 days supply | Qty: 30 | Fill #1 | Status: AC

## 2022-04-02 ENCOUNTER — Other Ambulatory Visit (HOSPITAL_COMMUNITY): Payer: Self-pay

## 2022-04-02 MED ORDER — LISINOPRIL-HYDROCHLOROTHIAZIDE 20-12.5 MG PO TABS
1.0000 | ORAL_TABLET | Freq: Every day | ORAL | 1 refills | Status: DC
  Filled 2022-04-02: qty 90, 90d supply, fill #0
  Filled 2022-07-01: qty 90, 90d supply, fill #1

## 2022-04-11 ENCOUNTER — Encounter (INDEPENDENT_AMBULATORY_CARE_PROVIDER_SITE_OTHER): Payer: Self-pay

## 2022-04-16 ENCOUNTER — Encounter (INDEPENDENT_AMBULATORY_CARE_PROVIDER_SITE_OTHER): Payer: Self-pay | Admitting: Nurse Practitioner

## 2022-04-16 ENCOUNTER — Other Ambulatory Visit (HOSPITAL_COMMUNITY): Payer: Self-pay

## 2022-04-16 ENCOUNTER — Encounter (INDEPENDENT_AMBULATORY_CARE_PROVIDER_SITE_OTHER): Payer: Self-pay

## 2022-04-16 ENCOUNTER — Telehealth (INDEPENDENT_AMBULATORY_CARE_PROVIDER_SITE_OTHER): Payer: Self-pay | Admitting: Family Medicine

## 2022-04-16 ENCOUNTER — Ambulatory Visit (INDEPENDENT_AMBULATORY_CARE_PROVIDER_SITE_OTHER): Payer: 59 | Admitting: Nurse Practitioner

## 2022-04-16 VITALS — BP 135/85 | HR 87 | Temp 98.3°F | Ht 65.0 in | Wt 266.0 lb

## 2022-04-16 DIAGNOSIS — Z7985 Long-term (current) use of injectable non-insulin antidiabetic drugs: Secondary | ICD-10-CM

## 2022-04-16 DIAGNOSIS — E669 Obesity, unspecified: Secondary | ICD-10-CM | POA: Diagnosis not present

## 2022-04-16 DIAGNOSIS — E781 Pure hyperglyceridemia: Secondary | ICD-10-CM

## 2022-04-16 DIAGNOSIS — Z6841 Body Mass Index (BMI) 40.0 and over, adult: Secondary | ICD-10-CM | POA: Diagnosis not present

## 2022-04-16 MED ORDER — SAXENDA 18 MG/3ML ~~LOC~~ SOPN
3.0000 mg | PEN_INJECTOR | Freq: Every day | SUBCUTANEOUS | 0 refills | Status: DC
  Filled 2022-04-16: qty 15, 30d supply, fill #0

## 2022-04-16 NOTE — Telephone Encounter (Signed)
Dawn [employer] - Prior authorization approved for [employer]. Effective: 04/15/2022 to 04/15/2023. Patient sent approval message via mychart.

## 2022-04-23 NOTE — Progress Notes (Unsigned)
Chief Complaint:   OBESITY [name] is here to discuss her progress with her obesity treatment plan along with follow-up of her obesity related diagnoses. [name] is on the Category 3 Plan and states she is following her eating plan approximately 85% of the time. [name] states she is cardio 45 minutes 3 times per week.  Today's visit was #: 62 Starting weight: 250 lbs Starting date: 09/08/2018 Today's weight: 266 lbs Today's date: 04/16/2022 Total lbs lost to date: 0 lbs Total lbs lost since last in-office visit: 0  Interim History: [name] is frustrated with weight gain. She is not able to eat all her meals due to her work schedule. She is eating 2 meals per day. Breakfast is her biggest meal. She is eating 3 eggs, a meat and [employer]life milk and for dinner she is eating a meat and veggies. Drinking water and rarely a tea or soda. She is taking Saxenda 0.6 mg. Denies any side effects. She works night shift. Sleeps 1-2pm-7pm.  Subjective:   1. High triglycerides Looks better.  Labs reviewed today.   Assessment/Plan:   1. High triglycerides Cardiovascular risk and specific lipid/LDL goals reviewed.  We discussed several lifestyle modifications today and [name] will continue to work on diet, exercise and weight loss efforts. Orders and follow up as documented in patient record.   Counseling Intensive lifestyle modifications are the first line treatment for this issue. Dietary changes: Increase soluble fiber. Decrease simple carbohydrates. Exercise changes: Moderate to vigorous-intensity aerobic activity 150 minutes per week if tolerated. Lipid-lowering medications: see documented in medical record.  2. Obesity with current BMI 44.3 We will refill Saxenda 3 mg SubQ daily for 1 month with 0 refills. Side effects discussed.   -Refill Liraglutide -Weight Management (SAXENDA) 18 MG/3ML SOPN; Inject 3 mg into the skin daily.  Dispense: 15 mL; Refill: 0  [name] is currently in the action stage of  change. As such, her goal is to continue with weight loss efforts. She has agreed to the Category 3 Plan.   [name] will add protein shake.  Exercise goals: As is.  Behavioral modification strategies: increasing lean protein intake, increasing water intake, and keeping a strict food journal.  [name] has agreed to follow-up with our clinic in 3 weeks. She was informed of the importance of frequent follow-up visits to maximize her success with intensive lifestyle modifications for her multiple health conditions.   Objective:   Blood pressure 135/85, pulse 87, temperature 98.3 F (36.8 C), height '5\' 5"'$  (1.651 m), weight 266 lb (120.7 kg), SpO2 98 %. Body mass index is 44.26 kg/m.  General: Cooperative, alert, well developed, in no acute distress. HEENT: Conjunctivae and lids unremarkable. Cardiovascular: Regular rhythm.  Lungs: Normal work of breathing. Neurologic: No focal deficits.   Lab Results  Component Value Date   CREATININE 0.73 01/09/2022   BUN 17 01/09/2022   NA 137 01/09/2022   K 3.6 01/09/2022   CL 107 01/09/2022   CO2 19 (L) 01/09/2022   Lab Results  Component Value Date   ALT 27 01/09/2022   AST 15 01/09/2022   ALKPHOS 40 01/09/2022   BILITOT 0.6 01/09/2022   Lab Results  Component Value Date   HGBA1C 5.1 07/10/2021   HGBA1C 5.0 06/13/2020   HGBA1C 5.1 12/21/2019   HGBA1C 5.1 05/04/2019   HGBA1C 5.1 01/29/2019   Lab Results  Component Value Date   INSULIN 16.0 07/10/2021   INSULIN 18.2 06/13/2020   INSULIN 22.1 12/21/2019  INSULIN 13.2 05/04/2019   INSULIN 16.1 01/29/2019   Lab Results  Component Value Date   TSH 2.390 09/18/2021   Lab Results  Component Value Date   CHOL 161 03/19/2022   HDL 46 03/19/2022   LDLCALC 89 03/19/2022   TRIG 151 (H) 03/19/2022   CHOLHDL 3.5 03/19/2022   Lab Results  Component Value Date   VD25OH 64.1 09/18/2021   VD25OH 58.8 07/10/2021   VD25OH 60.1 06/13/2020   Lab Results  Component Value Date   WBC 9.3  01/09/2022   HGB 13.6 01/09/2022   HCT 41.1 01/09/2022   MCV 91.1 01/09/2022   PLT 214 01/09/2022   No results found for: "IRON", "TIBC", "FERRITIN"  Attestation Statements:   Reviewed by clinician on day of visit: allergies, medications, problem list, medical history, surgical history, family history, social history, and previous encounter notes.  I, Brendell Tyus, RMA, am acting as transcriptionist for Everardo Pacific, FNP.  I have reviewed the above documentation for accuracy and completeness, and I agree with the above. Everardo Pacific, FNP

## 2022-04-26 ENCOUNTER — Other Ambulatory Visit (HOSPITAL_COMMUNITY): Payer: Self-pay

## 2022-04-29 ENCOUNTER — Other Ambulatory Visit: Payer: Self-pay | Admitting: Cardiology

## 2022-04-29 ENCOUNTER — Other Ambulatory Visit: Payer: Self-pay | Admitting: Family

## 2022-04-30 ENCOUNTER — Other Ambulatory Visit (HOSPITAL_COMMUNITY): Payer: Self-pay

## 2022-04-30 MED ORDER — APIXABAN 5 MG PO TABS
5.0000 mg | ORAL_TABLET | Freq: Two times a day (BID) | ORAL | 1 refills | Status: DC
  Filled 2022-04-30: qty 180, 90d supply, fill #0
  Filled 2022-07-29: qty 180, 90d supply, fill #1

## 2022-04-30 MED ORDER — POTASSIUM CHLORIDE CRYS ER 20 MEQ PO TBCR
20.0000 meq | EXTENDED_RELEASE_TABLET | Freq: Every day | ORAL | 1 refills | Status: DC
  Filled 2022-04-30: qty 30, 30d supply, fill #0
  Filled 2022-06-01: qty 30, 30d supply, fill #1

## 2022-05-10 ENCOUNTER — Ambulatory Visit (INDEPENDENT_AMBULATORY_CARE_PROVIDER_SITE_OTHER): Payer: 59 | Admitting: Nurse Practitioner

## 2022-05-10 ENCOUNTER — Encounter (INDEPENDENT_AMBULATORY_CARE_PROVIDER_SITE_OTHER): Payer: Self-pay | Admitting: Nurse Practitioner

## 2022-05-10 VITALS — BP 137/83 | HR 91 | Temp 98.7°F | Ht 65.0 in | Wt 266.0 lb

## 2022-05-10 DIAGNOSIS — Z6841 Body Mass Index (BMI) 40.0 and over, adult: Secondary | ICD-10-CM

## 2022-05-10 DIAGNOSIS — I10 Essential (primary) hypertension: Secondary | ICD-10-CM | POA: Diagnosis not present

## 2022-05-10 DIAGNOSIS — E669 Obesity, unspecified: Secondary | ICD-10-CM

## 2022-05-14 ENCOUNTER — Telehealth: Payer: Self-pay | Admitting: Family Medicine

## 2022-05-14 NOTE — Progress Notes (Unsigned)
Chief Complaint:   OBESITY [name] is here to discuss her progress with her obesity treatment plan along with follow-up of her obesity related diagnoses. [name] is on the Category 3 Plan and states she is following her eating plan approximately 85% of the time. [name] states she is treadmill 30 minutes 2 times per week.  Today's visit was #: 88 Starting weight: 250 lbs Starting date: 09/08/2018 Today's weight: 266 lbs Today's date: 05/10/2022 Total lbs lost to date: 0 lbs Total lbs lost since last in-office visit: 0  Interim History: [name] has maintained her weight since her last visit. She is taking Saxenda 0.6 mg. Denies any side effects. Would like to increase dose due to hunger. Denies craving. Doing better with increasing protein intake. Started a protein shake, when she is at work. Drinking water daily.  Subjective:   1. Essential hypertension Shon is taking Lisinopril HCT 20-12.5 mg. Denies any side effects. Denies any chest pain,shortness of breath or palpitations. Family history: father, aunt, HTN and Afib.  Assessment/Plan:   1. Essential hypertension [name] is working on healthy weight loss and exercise to improve blood pressure control. We will watch for signs of hypotension as she continues her lifestyle modifications. [name] will continue to follow up with PCP and continue medication as directed.  2. Obesity with current BMI 44.3 Doloris is currently in the action stage of change. As such, her goal is to continue with weight loss efforts. She has agreed to the Category 3 Plan.   Exercise goals: As is.  Behavioral modification strategies: increasing lean protein intake, increasing vegetables, and increasing water intake.  [name] has agreed to follow-up with our clinic in 3 weeks. She was informed of the importance of frequent follow-up visits to maximize her success with intensive lifestyle modifications for her multiple health conditions.   Objective:   Blood pressure 137/83,  pulse 91, temperature 98.7 F (37.1 C), height '5\' 5"'$  (1.651 m), weight 266 lb (120.7 kg), SpO2 98 %. Body mass index is 44.26 kg/m.  General: Cooperative, alert, well developed, in no acute distress. HEENT: Conjunctivae and lids unremarkable. Cardiovascular: Regular rhythm.  Lungs: Normal work of breathing. Neurologic: No focal deficits.   Lab Results  Component Value Date   CREATININE 0.73 01/09/2022   BUN 17 01/09/2022   NA 137 01/09/2022   K 3.6 01/09/2022   CL 107 01/09/2022   CO2 19 (L) 01/09/2022   Lab Results  Component Value Date   ALT 27 01/09/2022   AST 15 01/09/2022   ALKPHOS 40 01/09/2022   BILITOT 0.6 01/09/2022   Lab Results  Component Value Date   HGBA1C 5.1 07/10/2021   HGBA1C 5.0 06/13/2020   HGBA1C 5.1 12/21/2019   HGBA1C 5.1 05/04/2019   HGBA1C 5.1 01/29/2019   Lab Results  Component Value Date   INSULIN 16.0 07/10/2021   INSULIN 18.2 06/13/2020   INSULIN 22.1 12/21/2019   INSULIN 13.2 05/04/2019   INSULIN 16.1 01/29/2019   Lab Results  Component Value Date   TSH 2.390 09/18/2021   Lab Results  Component Value Date   CHOL 161 03/19/2022   HDL 46 03/19/2022   LDLCALC 89 03/19/2022   TRIG 151 (H) 03/19/2022   CHOLHDL 3.5 03/19/2022   Lab Results  Component Value Date   VD25OH 64.1 09/18/2021   VD25OH 58.8 07/10/2021   VD25OH 60.1 06/13/2020   Lab Results  Component Value Date   WBC 9.3 01/09/2022   HGB 13.6 01/09/2022  HCT 41.1 01/09/2022   MCV 91.1 01/09/2022   PLT 214 01/09/2022   No results found for: "IRON", "TIBC", "FERRITIN"  Attestation Statements:   Reviewed by clinician on day of visit: allergies, medications, problem list, medical history, surgical history, family history, social history, and previous encounter notes.  I, Brendell Tyus, RMA, am acting as transcriptionist for Everardo Pacific, FNP.  I have reviewed the above documentation for accuracy and completeness, and I agree with the above. -  ***

## 2022-05-14 NOTE — Telephone Encounter (Signed)
LVM and sent mychart msg informing pt of appt r/s for 12/04/21- NP out.

## 2022-05-16 NOTE — Progress Notes (Signed)
Patient was not seen today.

## 2022-05-18 ENCOUNTER — Other Ambulatory Visit: Payer: Self-pay

## 2022-05-21 ENCOUNTER — Other Ambulatory Visit (HOSPITAL_COMMUNITY): Payer: Self-pay

## 2022-05-21 ENCOUNTER — Other Ambulatory Visit: Payer: Self-pay

## 2022-05-21 MED ORDER — ACETAZOLAMIDE 250 MG PO TABS
250.0000 mg | ORAL_TABLET | Freq: Two times a day (BID) | ORAL | 0 refills | Status: DC
  Filled 2022-05-21: qty 180, 90d supply, fill #0

## 2022-05-22 ENCOUNTER — Other Ambulatory Visit (HOSPITAL_COMMUNITY): Payer: Self-pay

## 2022-06-01 ENCOUNTER — Other Ambulatory Visit (HOSPITAL_BASED_OUTPATIENT_CLINIC_OR_DEPARTMENT_OTHER): Payer: Self-pay

## 2022-06-01 ENCOUNTER — Other Ambulatory Visit (HOSPITAL_COMMUNITY): Payer: Self-pay

## 2022-06-04 ENCOUNTER — Ambulatory Visit (INDEPENDENT_AMBULATORY_CARE_PROVIDER_SITE_OTHER): Payer: 59 | Admitting: Family Medicine

## 2022-06-13 ENCOUNTER — Other Ambulatory Visit: Payer: Self-pay | Admitting: Family

## 2022-06-13 DIAGNOSIS — J301 Allergic rhinitis due to pollen: Secondary | ICD-10-CM

## 2022-06-13 DIAGNOSIS — J4520 Mild intermittent asthma, uncomplicated: Secondary | ICD-10-CM

## 2022-06-14 ENCOUNTER — Other Ambulatory Visit (HOSPITAL_COMMUNITY): Payer: Self-pay

## 2022-06-14 MED ORDER — MONTELUKAST SODIUM 10 MG PO TABS
10.0000 mg | ORAL_TABLET | Freq: Every day | ORAL | 1 refills | Status: DC
  Filled 2022-06-14: qty 90, 90d supply, fill #0
  Filled 2022-09-06: qty 90, 90d supply, fill #1

## 2022-06-18 ENCOUNTER — Encounter (INDEPENDENT_AMBULATORY_CARE_PROVIDER_SITE_OTHER): Payer: Self-pay | Admitting: Family Medicine

## 2022-06-18 ENCOUNTER — Ambulatory Visit (INDEPENDENT_AMBULATORY_CARE_PROVIDER_SITE_OTHER): Payer: 59 | Admitting: Family Medicine

## 2022-06-18 VITALS — BP 130/3 | HR 91 | Temp 97.8°F | Ht 65.0 in | Wt 267.0 lb

## 2022-06-18 DIAGNOSIS — I10 Essential (primary) hypertension: Secondary | ICD-10-CM | POA: Diagnosis not present

## 2022-06-18 DIAGNOSIS — G932 Benign intracranial hypertension: Secondary | ICD-10-CM

## 2022-06-18 DIAGNOSIS — Z6841 Body Mass Index (BMI) 40.0 and over, adult: Secondary | ICD-10-CM

## 2022-06-18 DIAGNOSIS — E669 Obesity, unspecified: Secondary | ICD-10-CM | POA: Diagnosis not present

## 2022-06-25 NOTE — Progress Notes (Signed)
Chief Complaint:   OBESITY [name] is here to discuss her progress with her obesity treatment plan along with follow-up of her obesity related diagnoses. [name] is on the Category 3 Plan and states she is following her eating plan approximately 70% of the time. [name] states she is walking on a treadmill 30 minutes 3 times per week.  Today's visit was #: 92 Starting weight: 250 lbs Starting date: 09/08/2018 Today's weight: 267 lbs Today's date: 06/18/2022 Total lbs lost to date: 0 Total lbs lost since last in-office visit: 0  Interim History: Restarted Saxenda after vacation (forgot to bring on vacation).  Has ramped up to 1.2 mg daily.  Works 3rd shift.  Added a protein shake while working instead of meal skipping.  Niece died in 01/31/2023. Denies emotional eating. Doing some hiking and treadmill walking.    Subjective:   1. Intracranial hypertension Denies frequent headaches on Diamox 250 mg BID, per Neurology.    2. Essential hypertension Blood pressure well controlled today on lisinopril/HCTZ daily.  Denies chest pain.   Assessment/Plan:   1. Intracranial hypertension Look for further ICH improvements with weight loss.  Consider bariatric surgery if MWM fails.   2. Essential hypertension Continue current blood pressure medications per PCP.   3. Obesity,current BMI 44.5 1) Reviewed net weight gain 171 lb.  January 2020 1st visit.  2) Continue to ram Saxenda dose every 7 days as directed.   [name] is currently in the action stage of change. As such, her goal is to continue with weight loss efforts. She has agreed to the Category 3 Plan.   Exercise goals:  treadmill walking 30 minutes 4 times a week.   Behavioral modification strategies: increasing lean protein intake, increasing vegetables, increasing water intake, decreasing eating out, no skipping meals, meal planning and cooking strategies, keeping healthy foods in the home, emotional eating strategies, and decreasing junk  food.  [name] has agreed to follow-up with our clinic in 4 weeks. She was informed of the importance of frequent follow-up visits to maximize her success with intensive lifestyle modifications for her multiple health conditions.   Objective:   Blood pressure (!) 130/3, pulse 91, temperature 97.8 F (36.6 C), height '5\' 5"'$  (1.651 m), weight 267 lb (121.1 kg), SpO2 98 %. Body mass index is 44.43 kg/m.  General: Cooperative, alert, well developed, in no acute distress. HEENT: Conjunctivae and lids unremarkable. Cardiovascular: Regular rhythm.  Lungs: Normal work of breathing. Neurologic: No focal deficits.   Lab Results  Component Value Date   CREATININE 0.73 01/09/2022   BUN 17 01/09/2022   NA 137 01/09/2022   K 3.6 01/09/2022   CL 107 01/09/2022   CO2 19 (L) 01/09/2022   Lab Results  Component Value Date   ALT 27 01/09/2022   AST 15 01/09/2022   ALKPHOS 40 01/09/2022   BILITOT 0.6 01/09/2022   Lab Results  Component Value Date   HGBA1C 5.1 07/10/2021   HGBA1C 5.0 06/13/2020   HGBA1C 5.1 12/21/2019   HGBA1C 5.1 05/04/2019   HGBA1C 5.1 01/29/2019   Lab Results  Component Value Date   INSULIN 16.0 07/10/2021   INSULIN 18.2 06/13/2020   INSULIN 22.1 12/21/2019   INSULIN 13.2 05/04/2019   INSULIN 16.1 01/29/2019   Lab Results  Component Value Date   TSH 2.390 09/18/2021   Lab Results  Component Value Date   CHOL 161 03/19/2022   HDL 46 03/19/2022   LDLCALC 89 03/19/2022   TRIG 151 (  H) 03/19/2022   CHOLHDL 3.5 03/19/2022   Lab Results  Component Value Date   VD25OH 64.1 09/18/2021   VD25OH 58.8 07/10/2021   VD25OH 60.1 06/13/2020   Lab Results  Component Value Date   WBC 9.3 01/09/2022   HGB 13.6 01/09/2022   HCT 41.1 01/09/2022   MCV 91.1 01/09/2022   PLT 214 01/09/2022   No results found for: "IRON", "TIBC", "FERRITIN"  Attestation Statements:   Reviewed by clinician on day of visit: allergies, medications, problem list, medical history, surgical  history, family history, social history, and previous encounter notes.  I, Davy Pique, am acting as Location manager for Loyal Gambler, DO.  I have reviewed the above documentation for accuracy and completeness, and I agree with the above. Dell Ponto, DO

## 2022-07-01 ENCOUNTER — Other Ambulatory Visit: Payer: Self-pay | Admitting: Cardiology

## 2022-07-02 ENCOUNTER — Other Ambulatory Visit (HOSPITAL_COMMUNITY): Payer: Self-pay

## 2022-07-02 MED ORDER — POTASSIUM CHLORIDE CRYS ER 20 MEQ PO TBCR
20.0000 meq | EXTENDED_RELEASE_TABLET | Freq: Every day | ORAL | 0 refills | Status: DC
  Filled 2022-07-02: qty 30, 30d supply, fill #0

## 2022-07-11 ENCOUNTER — Ambulatory Visit: Payer: Self-pay

## 2022-07-11 ENCOUNTER — Other Ambulatory Visit: Payer: Self-pay | Admitting: Family Medicine

## 2022-07-11 DIAGNOSIS — M25461 Effusion, right knee: Secondary | ICD-10-CM

## 2022-07-16 ENCOUNTER — Ambulatory Visit (INDEPENDENT_AMBULATORY_CARE_PROVIDER_SITE_OTHER): Payer: 59 | Admitting: Family Medicine

## 2022-07-29 ENCOUNTER — Other Ambulatory Visit: Payer: Self-pay | Admitting: Cardiology

## 2022-07-30 ENCOUNTER — Other Ambulatory Visit (HOSPITAL_COMMUNITY): Payer: Self-pay

## 2022-07-31 ENCOUNTER — Other Ambulatory Visit (HOSPITAL_COMMUNITY): Payer: Self-pay

## 2022-07-31 MED ORDER — POTASSIUM CHLORIDE CRYS ER 20 MEQ PO TBCR
20.0000 meq | EXTENDED_RELEASE_TABLET | Freq: Every day | ORAL | 3 refills | Status: DC
  Filled 2022-07-31: qty 90, 90d supply, fill #0
  Filled 2022-11-02: qty 90, 90d supply, fill #1
  Filled 2023-01-27: qty 90, 90d supply, fill #2
  Filled 2023-04-30: qty 90, 90d supply, fill #3

## 2022-08-12 ENCOUNTER — Other Ambulatory Visit: Payer: Self-pay

## 2022-08-13 ENCOUNTER — Other Ambulatory Visit: Payer: Self-pay

## 2022-08-13 ENCOUNTER — Other Ambulatory Visit (HOSPITAL_COMMUNITY): Payer: Self-pay

## 2022-08-13 MED ORDER — ACETAZOLAMIDE 250 MG PO TABS
250.0000 mg | ORAL_TABLET | Freq: Two times a day (BID) | ORAL | 0 refills | Status: DC
  Filled 2022-08-13: qty 180, 90d supply, fill #0

## 2022-08-14 ENCOUNTER — Other Ambulatory Visit (HOSPITAL_COMMUNITY): Payer: Self-pay

## 2022-09-04 ENCOUNTER — Encounter: Payer: Self-pay | Admitting: Internal Medicine

## 2022-09-06 ENCOUNTER — Other Ambulatory Visit: Payer: Self-pay

## 2022-09-06 ENCOUNTER — Other Ambulatory Visit (HOSPITAL_COMMUNITY): Payer: Self-pay

## 2022-09-07 ENCOUNTER — Other Ambulatory Visit (HOSPITAL_COMMUNITY): Payer: Self-pay

## 2022-09-07 ENCOUNTER — Other Ambulatory Visit: Payer: Self-pay

## 2022-09-19 ENCOUNTER — Other Ambulatory Visit: Payer: Self-pay | Admitting: Family

## 2022-09-19 ENCOUNTER — Other Ambulatory Visit (HOSPITAL_COMMUNITY): Payer: Self-pay

## 2022-09-19 DIAGNOSIS — G932 Benign intracranial hypertension: Secondary | ICD-10-CM

## 2022-09-19 DIAGNOSIS — I10 Essential (primary) hypertension: Secondary | ICD-10-CM

## 2022-09-19 MED ORDER — LISINOPRIL-HYDROCHLOROTHIAZIDE 20-12.5 MG PO TABS
1.0000 | ORAL_TABLET | Freq: Every day | ORAL | 1 refills | Status: DC
  Filled 2022-09-19: qty 90, 90d supply, fill #0
  Filled 2022-12-28: qty 90, 90d supply, fill #1

## 2022-09-20 ENCOUNTER — Ambulatory Visit: Payer: 59 | Admitting: Family

## 2022-09-24 ENCOUNTER — Encounter: Payer: Self-pay | Admitting: Family

## 2022-09-27 ENCOUNTER — Other Ambulatory Visit (HOSPITAL_COMMUNITY): Payer: Self-pay

## 2022-09-27 ENCOUNTER — Encounter: Payer: Self-pay | Admitting: Internal Medicine

## 2022-09-27 ENCOUNTER — Ambulatory Visit: Payer: Commercial Managed Care - PPO | Admitting: Internal Medicine

## 2022-09-27 VITALS — BP 110/76 | HR 93 | Ht 65.0 in | Wt 266.0 lb

## 2022-09-27 DIAGNOSIS — Z1211 Encounter for screening for malignant neoplasm of colon: Secondary | ICD-10-CM

## 2022-09-27 DIAGNOSIS — Z8371 Family history of adenomatous and serrated polyps: Secondary | ICD-10-CM | POA: Diagnosis not present

## 2022-09-27 DIAGNOSIS — Z800 Family history of malignant neoplasm of digestive organs: Secondary | ICD-10-CM

## 2022-09-27 DIAGNOSIS — Z7901 Long term (current) use of anticoagulants: Secondary | ICD-10-CM

## 2022-09-27 DIAGNOSIS — Z5181 Encounter for therapeutic drug level monitoring: Secondary | ICD-10-CM

## 2022-09-27 MED ORDER — NA SULFATE-K SULFATE-MG SULF 17.5-3.13-1.6 GM/177ML PO SOLN
1.0000 | Freq: Once | ORAL | 0 refills | Status: AC
  Filled 2022-09-27: qty 354, 2d supply, fill #0

## 2022-09-27 NOTE — Progress Notes (Signed)
HISTORY OF PRESENT ILLNESS:  [name] is a 52 y.o. 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  She presents today regarding routine screening colonoscopy.  Patient reports a family history of colon cancer in her great grandfather and adenomatous colon polyps in her sister.  Patient herself has a negative GI review of systems.  Review of blood work from May 2023 shows normal comprehensive metabolic panel and normal CBC with hemoglobin 13.6.  Abdominal films from August 2022 were unremarkable.  For chronic anticoagulation supervised by hematology, Dr. Lorenso Courier  REVIEW OF SYSTEMS:  All non-GI ROS negative unless otherwise stated in the HPI except for sinus and allergy trouble, nosebleeds, menstrual cramps  Past Medical History:  Diagnosis Date   Asthma    Chiari I malformation ([address])    Chiari malformation 1996   decompression    High blood pressure    Hypertension    Idiopathic intracranial hypertension    Kidney stone    Lactose intolerance    Obesity    Pseudotumor cerebri    Subclavian bypass stenosis ([place]) 07/1996   right   Swallowing difficulty     Past Surgical History:  Procedure Laterality Date   APPENDECTOMY     chiari decompression     GANGLION CYST EXCISION Right    wrist   LAPAROSCOPIC APPENDECTOMY N/A 10/28/2015   Procedure: APPENDECTOMY LAPAROSCOPIC;  Surgeon: Armandina Gemma, MD;  Location: [facility];  Service: General;  Laterality: N/A;   SUBCLAVIAN BYPASS GRAFT Right 07/1996   WRIST FRACTURE SURGERY Right 02/2007    Social History [name]  reports that she has never smoked. She has never used smokeless tobacco. She reports that she does not drink alcohol and does not use drugs.  family history includes Atrial fibrillation in her father; Cataracts in her mother; Diabetes in her father and mother; High Cholesterol in her  mother; Hypertension in her father and mother; Lung cancer in her paternal grandmother; Obesity in her father and mother; Thyroid cancer in her maternal great-grandmother.  Allergies  Allergen Reactions   Codeine Other (See Comments)    "whole body turned bright red, swelled a little" with V codiene   Lyrica [Pregabalin] Other (See Comments)    "suicidal thoughts"   Nyquil Multi-Symptom [Pseudoeph-Doxylamine-Dm-Apap] Other (See Comments)    Turns skin red *all over body*, pt reports getting very hot    Sulfa Antibiotics Other (See Comments)    "body turned red"       PHYSICAL EXAMINATION: Vital signs: BP 110/76   Pulse 93   Ht '5\' 5"'$  (1.651 m)   Wt 266 lb (120.7 kg)   BMI 44.26 kg/m   Constitutional: In, generally well-appearing, no acute distress Psychiatric: alert and oriented x3, cooperative Eyes: extraocular movements intact, anicteric, conjunctiva pink Mouth: oral pharynx moist, no lesions Neck: supple no lymphadenopathy Cardiovascular: heart regular rate and rhythm, no murmur Lungs: clear to auscultation bilaterally Abdomen: soft, obese, nontender, nondistended, no obvious ascites, no peritoneal signs, normal bowel sounds, no organomegaly Rectal: Deferred until colonoscopy Extremities: no clubbing, cyanosis.  Trace lower extremity edema bilaterally Skin: no lesions on visible extremities Neuro: No focal deficits.  Cranial nerves intact  ASSESSMENT:  1.  Screening colonoscopy.  Than baseline procedural risk due to chronic anticoagulation status and obesity with BMI 44.3 2.  Family history of colon cancer and colon polyps as described 3.  Morbid obesity 4.  History of bilateral pulmonary emboli April 2022.  On chronic Eliquis   PLAN:  1.  Colonoscopy.  Patient is high risk as above.The nature of the procedure, as well as the risks, benefits, and alternatives were carefully and thoroughly reviewed with the patient. Ample time for discussion and questions allowed. The  patient understood, was satisfied, and agreed to proceed. 2.  Hold Eliquis 2 days prior to procedure.  Would dissipate resumption of her anticoagulant immediately post procedure unless significant therapeutics required.  We discussed the pros and cons of interrupting anticoagulation therapy.  She understands.  I will reach out to Dr. Lorenso Courier regarding our plans.

## 2022-09-27 NOTE — Patient Instructions (Signed)
_______________________________________________________  If your blood pressure at your visit was 140/90 or greater, please contact your primary care physician to follow up on this.  _______________________________________________________  If you are age 50 or older, your body mass index should be between 23-30. Your Body mass index is 44.26 kg/m. If this is out of the aforementioned range listed, please consider follow up with your Primary Care Provider.  If you are age 70 or younger, your body mass index should be between 19-25. Your Body mass index is 44.26 kg/m. If this is out of the aformentioned range listed, please consider follow up with your Primary Care Provider.   ________________________________________________________  The [facility] providers would like to encourage you to use [facility] to communicate with providers for non-urgent requests or questions.  Due to long hold times on the telephone, sending your provider a message by [facility] may be a faster and more efficient way to get a response.  Please allow 48 business hours for a response.  Please remember that this is for non-urgent requests.  _______________________________________________________  Dennis Bast have been scheduled for a colonoscopy. Please follow written instructions given to you at your visit today.  Please pick up your prep supplies at the pharmacy within the next 1-3 days. If you use inhalers (even only as needed), please bring them with you on the day of your procedure.

## 2022-10-03 ENCOUNTER — Telehealth: Payer: Self-pay

## 2022-10-03 NOTE — Telephone Encounter (Signed)
   [name] Jun 02, 1971 [id number]  Dear [name]:  We have scheduled the above named patient for a(n) colonoscopy procedure. Our records show that (s)he is on anticoagulation therapy.  Please advise as to whether the patient may come off their therapy of ELIQUIS 2 days prior to their procedure which is scheduled for 10-11-22 with Dr. Henrene Pastor.  Please route your response to Tia Alert, [place] or fax response to [phone].  Sincerely,    [facility]

## 2022-10-03 NOTE — Telephone Encounter (Signed)
-----  Message from Irene Shipper, MD sent at 09/30/2022  2:38 PM EST ----- Thanks Ulice Dash. Huel Coventry, See below. Thanks   ----- Message ----- From: Orson Slick, MD Sent: 09/30/2022  11:01 AM EST To: Irene Shipper, MD  Dr. Henrene Pastor,  It should be safe to hold her eliquis this far out from the pulmonary emboli. I agree with the timing.  Colan Neptune  ----- Message ----- From: Irene Shipper, MD Sent: 09/27/2022  10:52 AM EST To: Orson Slick, MD  Judene Companion is here for routine screening colonoscopy.  She has been on Eliquis for nearly 2 years due to history of pulmonary emboli.  I would like to hold her Eliquis 2 days prior to the colonoscopy with plans to resume anticoagulation immediately post procedure (unless significant therapeutics required).  Wanted to make sure this was okay.  Thanks

## 2022-10-04 ENCOUNTER — Other Ambulatory Visit: Payer: Self-pay | Admitting: Family

## 2022-10-04 ENCOUNTER — Other Ambulatory Visit (HOSPITAL_COMMUNITY): Payer: Self-pay

## 2022-10-04 DIAGNOSIS — Z5181 Encounter for therapeutic drug level monitoring: Secondary | ICD-10-CM

## 2022-10-04 DIAGNOSIS — Z86711 Personal history of pulmonary embolism: Secondary | ICD-10-CM

## 2022-10-04 MED ORDER — ENOXAPARIN SODIUM 120 MG/0.8ML IJ SOSY
1.0000 mg/kg | PREFILLED_SYRINGE | Freq: Two times a day (BID) | INTRAMUSCULAR | 0 refills | Status: DC
  Filled 2022-10-04: qty 2.4, 2d supply, fill #0

## 2022-10-04 NOTE — Progress Notes (Signed)
PT is high risk. She needs to take Eliquis Monday (10/08/22) normally, then start Lovenox 120 mg BID on Tuesday and Wednesday BID (10/09/22-10/10/22), Hold on Thursday and resume Eliquis on Friday.

## 2022-10-04 NOTE — Progress Notes (Signed)
Patient aware and verbalizes understanding. 

## 2022-10-04 NOTE — Telephone Encounter (Signed)
See response from [facility] in another TE. Patient aware to hold Eliquis for 2 days

## 2022-10-11 ENCOUNTER — Ambulatory Visit (AMBULATORY_SURGERY_CENTER): Payer: Commercial Managed Care - PPO | Admitting: Internal Medicine

## 2022-10-11 ENCOUNTER — Encounter: Payer: Self-pay | Admitting: Internal Medicine

## 2022-10-11 VITALS — BP 126/68 | HR 75 | Temp 97.8°F | Resp 14 | Ht 65.0 in | Wt 266.0 lb

## 2022-10-11 DIAGNOSIS — Z1211 Encounter for screening for malignant neoplasm of colon: Secondary | ICD-10-CM

## 2022-10-11 DIAGNOSIS — D124 Benign neoplasm of descending colon: Secondary | ICD-10-CM

## 2022-10-11 DIAGNOSIS — K5140 Inflammatory polyps of colon without complications: Secondary | ICD-10-CM | POA: Diagnosis not present

## 2022-10-11 DIAGNOSIS — D122 Benign neoplasm of ascending colon: Secondary | ICD-10-CM

## 2022-10-11 DIAGNOSIS — J45909 Unspecified asthma, uncomplicated: Secondary | ICD-10-CM | POA: Diagnosis not present

## 2022-10-11 DIAGNOSIS — D125 Benign neoplasm of sigmoid colon: Secondary | ICD-10-CM | POA: Diagnosis not present

## 2022-10-11 DIAGNOSIS — Z7901 Long term (current) use of anticoagulants: Secondary | ICD-10-CM | POA: Diagnosis not present

## 2022-10-11 DIAGNOSIS — D123 Benign neoplasm of transverse colon: Secondary | ICD-10-CM | POA: Diagnosis not present

## 2022-10-11 DIAGNOSIS — I10 Essential (primary) hypertension: Secondary | ICD-10-CM | POA: Diagnosis not present

## 2022-10-11 DIAGNOSIS — K635 Polyp of colon: Secondary | ICD-10-CM | POA: Diagnosis not present

## 2022-10-11 MED ORDER — SODIUM CHLORIDE 0.9 % IV SOLN: 500.0000 mL | Freq: Once | INTRAVENOUS | Status: DC

## 2022-10-11 NOTE — Patient Instructions (Addendum)
Await pathology results.  Handout on polyps, diverticulosis, and hemorrhoids provided.  Resume Eliquis tomorrow at prior dose.  YOU HAD AN ENDOSCOPIC PROCEDURE TODAY AT [facility]:   Refer to the procedure report that was given to you for any specific questions about what was found during the examination.  If the procedure report does not answer your questions, please call your gastroenterologist to clarify.  If you requested that your care partner not be given the details of your procedure findings, then the procedure report has been included in a sealed envelope for you to review at your convenience later.  YOU SHOULD EXPECT: Some feelings of bloating in the abdomen. Passage of more gas than usual.  Walking can help get rid of the air that was put into your GI tract during the procedure and reduce the bloating. If you had a lower endoscopy (such as a colonoscopy or flexible sigmoidoscopy) you may notice spotting of blood in your stool or on the toilet paper. If you underwent a bowel prep for your procedure, you may not have a normal bowel movement for a few days.  Please Note:  You might notice some irritation and congestion in your nose or some drainage.  This is from the oxygen used during your procedure.  There is no need for concern and it should clear up in a day or so.  SYMPTOMS TO REPORT IMMEDIATELY:  Following lower endoscopy (colonoscopy or flexible sigmoidoscopy):  Excessive amounts of blood in the stool  Significant tenderness or worsening of abdominal pains  Swelling of the abdomen that is new, acute  Fever of 100F or higher    For urgent or emergent issues, a gastroenterologist can be reached at any hour by calling [phone]. Do not use MyChart messaging for urgent concerns.    DIET:  We do recommend a small meal at first, but then you may proceed to your regular diet.  Drink plenty of fluids but you should avoid alcoholic beverages for 24  hours.  ACTIVITY:  You should plan to take it easy for the rest of today and you should NOT DRIVE or use heavy machinery until tomorrow (because of the sedation medicines used during the test).    FOLLOW UP: Our staff will call the number listed on your records the next business day following your procedure.  We will call around 7:15- 8:00 am to check on you and address any questions or concerns that you may have regarding the information given to you following your procedure. If we do not reach you, we will leave a message.     If any biopsies were taken you will be contacted by phone or by letter within the next 1-3 weeks.  Please call us at [phone] if you have not heard about the biopsies in 3 weeks.    SIGNATURES/CONFIDENTIALITY: You and/or your care partner have signed paperwork which will be entered into your electronic medical record.  These signatures attest to the fact that that the information above on your After Visit Summary has been reviewed and is understood.  Full responsibility of the confidentiality of this discharge information lies with you and/or your care-partner.

## 2022-10-11 NOTE — Op Note (Signed)
[facility] Patient Name: [name] Procedure Date: 10/11/2022 9:58 AM MRN: [id number] Endoscopist: Docia Chuck. Henrene Pastor , MD, [id number] Age: 52 Referring MD:  Date of Birth: [birth year] Gender: Female Account #: [id number] Procedure:                Colonoscopy with cold and hot snare polypectomy x                            1; biopsy polypectomy x 3 Indications:              Screening for colorectal malignant neoplasm Medicines:                Monitored Anesthesia Care Procedure:                Pre-Anesthesia Assessment:                           - Prior to the procedure, a History and Physical                            was performed, and patient medications and                            allergies were reviewed. The patient's tolerance of                            previous anesthesia was also reviewed. The risks                            and benefits of the procedure and the sedation                            options and risks were discussed with the patient.                            All questions were answered, and informed consent                            was obtained. Prior Anticoagulants: The patient has                            taken Eliquis (apixaban), last dose was 2 days                            prior to procedure. ASA Grade Assessment: III - A                            patient with severe systemic disease. After                            reviewing the risks and benefits, the patient was                            deemed in satisfactory condition to undergo the  procedure.                           After obtaining informed consent, the colonoscope                            was passed under direct vision. Throughout the                            procedure, the patient's blood pressure, pulse, and                            oxygen saturations were monitored continuously. The                            Olympus CF-HQ190L  [id number]) Colonoscope was                            introduced through the anus and advanced to the the                            cecum, identified by appendiceal orifice and                            ileocecal valve. The ileocecal valve, appendiceal                            orifice, and rectum were photographed. The quality                            of the bowel preparation was excellent. The                            colonoscopy was performed without difficulty. The                            patient tolerated the procedure well. The bowel                            preparation used was SUPREP via split dose                            instruction. Scope In: 10:08:27 AM Scope Out: 10:33:10 AM Scope Withdrawal Time: 0 hours 19 minutes 2 seconds  Total Procedure Duration: 0 hours 24 minutes 43 seconds  Findings:                 Three polyps were found in the transverse colon and                            ascending colon. The polyps were 1 mm in size.                            These polyps were removed with a jumbo cold  forceps. Resection and retrieval were complete. 1                            ascending colon polyp had persistent oozing which                            was successfully treated with snare tip coagulation                           A 5 mm polyp was found in the descending colon. The                            polyp was sessile. The polyp was removed with a                            cold snare. Resection and retrieval were complete.                           A 10 mm polyp was found in the sigmoid colon at 20                            cm from the anal verge. The polyp was pedunculated.                            The polyp was removed with a hot snare. Resection                            and retrieval were complete. Area was tattooed with                            an injection of 3 mL of Spot (carbon black).                            Diverticula were found in the left colon.                           Internal hemorrhoids were found during                            retroflexion. The hemorrhoids were small.                           The exam was otherwise without abnormality on                            direct and retroflexion views. Complications:            No immediate complications. Estimated blood loss:                            None. Estimated Blood Loss:     Estimated blood loss: none. Impression:               - Three 1 mm  polyps in the transverse colon and in                            the ascending colon, removed with a jumbo cold                            forceps. Resected and retrieved.                           - One 5 mm polyp in the descending colon, removed                            with a cold snare. Resected and retrieved.                           - One 10 mm polyp in the sigmoid colon, removed                            with a hot snare. Resected and retrieved. Tattooed.                           - Diverticulosis in the left colon.                           - Internal hemorrhoids.                           - The examination was otherwise normal on direct                            and retroflexion views. Recommendation:           - Repeat colonoscopy in 3 - 5 years for                            surveillance, pending final pathology.                           - Resume Eliquis (apixaban) tomorrow at prior dose.                           - Patient has a contact number available for                            emergencies. The signs and symptoms of potential                            delayed complications were discussed with the                            patient. Return to normal activities tomorrow.                            Written discharge instructions were provided to the  patient.                           - Resume previous diet.                           -  Continue present medications.                           - Await pathology results. Docia Chuck. Henrene Pastor, MD 10/11/2022 10:48:35 AM This report has been signed electronically.

## 2022-10-11 NOTE — Progress Notes (Signed)
HISTORY OF PRESENT ILLNESS:   [name] is a 52 y.o. 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  She presents today regarding routine screening colonoscopy.   Patient reports a family history of colon cancer in her great grandfather and adenomatous colon polyps in her sister.  Patient herself has a negative GI review of systems.  Review of blood work from May 2023 shows normal comprehensive metabolic panel and normal CBC with hemoglobin 13.6.  Abdominal films from August 2022 were unremarkable.   For chronic anticoagulation supervised by hematology, Dr. Lorenso Courier   REVIEW OF SYSTEMS:   All non-GI ROS negative unless otherwise stated in the HPI except for sinus and allergy trouble, nosebleeds, menstrual cramps       Past Medical History:  Diagnosis Date   Asthma     Chiari I malformation ([place])     Chiari malformation 1996    decompression    High blood pressure     Hypertension     Idiopathic intracranial hypertension     Kidney stone     Lactose intolerance     Obesity     Pseudotumor cerebri     Subclavian bypass stenosis ([place]) 07/1996    right   Swallowing difficulty             Past Surgical History:  Procedure Laterality Date   APPENDECTOMY       chiari decompression       GANGLION CYST EXCISION Right      wrist   LAPAROSCOPIC APPENDECTOMY N/A 10/28/2015    Procedure: APPENDECTOMY LAPAROSCOPIC;  Surgeon: Armandina Gemma, MD;  Location: [facility];  Service: General;  Laterality: N/A;   SUBCLAVIAN BYPASS GRAFT Right 07/1996   WRIST FRACTURE SURGERY Right 02/2007      Social History [name]  reports that she has never smoked. She has never used smokeless tobacco. She reports that she does not drink alcohol and does not use drugs.   family history includes Atrial fibrillation in her father; Cataracts in her mother; Diabetes in her  father and mother; High Cholesterol in her mother; Hypertension in her father and mother; Lung cancer in her paternal grandmother; Obesity in her father and mother; Thyroid cancer in her maternal great-grandmother.        Allergies  Allergen Reactions   Codeine Other (See Comments)      "whole body turned bright red, swelled a little" with V codiene   Lyrica [Pregabalin] Other (See Comments)      "suicidal thoughts"   Nyquil Multi-Symptom [Pseudoeph-Doxylamine-Dm-Apap] Other (See Comments)      Turns skin red *all over body*, pt reports getting very hot    Sulfa Antibiotics Other (See Comments)      "body turned red"          PHYSICAL EXAMINATION: Vital signs: BP 110/76   Pulse 93   Ht '5\' 5"'$  (1.651 m)   Wt 266 lb (120.7 kg)   BMI 44.26 kg/m   Constitutional: In, generally well-appearing, no acute distress Psychiatric: alert and oriented x3, cooperative Eyes: extraocular movements intact, anicteric, conjunctiva pink Mouth: oral pharynx moist, no lesions Neck: supple no lymphadenopathy Cardiovascular: heart regular rate and rhythm, no murmur Lungs: clear to auscultation bilaterally Abdomen: soft, obese, nontender, nondistended, no obvious ascites, no peritoneal signs, normal bowel sounds, no organomegaly Rectal: Deferred until colonoscopy Extremities: no clubbing,  cyanosis.  Trace lower extremity edema bilaterally Skin: no lesions on visible extremities Neuro: No focal deficits.  Cranial nerves intact   ASSESSMENT:   1.  Screening colonoscopy.  Than baseline procedural risk due to chronic anticoagulation status and obesity with BMI 44.3 2.  Family history of colon cancer and colon polyps as described 3.  Morbid obesity 4.  History of bilateral pulmonary emboli April 2022.  On chronic Eliquis     PLAN:   1.  Colonoscopy.  Patient is high risk as above.The nature of the procedure, as well as the risks, benefits, and alternatives were carefully and thoroughly reviewed with  the patient. Ample time for discussion and questions allowed. The patient understood, was satisfied, and agreed to proceed. 2.  Hold Eliquis 2 days prior to procedure.  Would dissipate resumption of her anticoagulant immediately post procedure unless significant therapeutics required.  We discussed the pros and cons of interrupting anticoagulation therapy.  She understands.  I will reach out to Dr. Lorenso Courier regarding our plans.

## 2022-10-11 NOTE — Progress Notes (Signed)
Pt's states no medical or surgical changes since previsit or office visit. 

## 2022-10-11 NOTE — Progress Notes (Signed)
Called to room to assist during endoscopic procedure.  Patient ID and intended procedure confirmed with present staff. Received instructions for my participation in the procedure from the performing physician.  

## 2022-10-11 NOTE — Progress Notes (Signed)
Pt resting comfortably. VSS. Airway intact. SBAR complete to RN. All questions answered.   

## 2022-10-12 ENCOUNTER — Emergency Department (HOSPITAL_COMMUNITY): Payer: Commercial Managed Care - PPO

## 2022-10-12 ENCOUNTER — Encounter (HOSPITAL_COMMUNITY): Payer: Self-pay

## 2022-10-12 ENCOUNTER — Observation Stay (HOSPITAL_COMMUNITY)
Admission: EM | Admit: 2022-10-12 | Discharge: 2022-10-13 | Disposition: A | Discharge status: Home / Self Care | Payer: Commercial Managed Care - PPO | Attending: Internal Medicine | Admitting: Internal Medicine

## 2022-10-12 ENCOUNTER — Telehealth: Payer: Self-pay | Admitting: *Deleted

## 2022-10-12 DIAGNOSIS — N179 Acute kidney failure, unspecified: Secondary | ICD-10-CM | POA: Insufficient documentation

## 2022-10-12 DIAGNOSIS — N132 Hydronephrosis with renal and ureteral calculous obstruction: Secondary | ICD-10-CM | POA: Diagnosis not present

## 2022-10-12 DIAGNOSIS — N200 Calculus of kidney: Secondary | ICD-10-CM

## 2022-10-12 DIAGNOSIS — Z86718 Personal history of other venous thrombosis and embolism: Secondary | ICD-10-CM | POA: Diagnosis not present

## 2022-10-12 DIAGNOSIS — E872 Acidosis, unspecified: Secondary | ICD-10-CM | POA: Diagnosis not present

## 2022-10-12 DIAGNOSIS — Z86711 Personal history of pulmonary embolism: Secondary | ICD-10-CM | POA: Diagnosis not present

## 2022-10-12 DIAGNOSIS — Z6841 Body Mass Index (BMI) 40.0 and over, adult: Secondary | ICD-10-CM | POA: Insufficient documentation

## 2022-10-12 DIAGNOSIS — Z79899 Other long term (current) drug therapy: Secondary | ICD-10-CM | POA: Diagnosis not present

## 2022-10-12 DIAGNOSIS — R1032 Left lower quadrant pain: Secondary | ICD-10-CM | POA: Diagnosis present

## 2022-10-12 DIAGNOSIS — R109 Unspecified abdominal pain: Secondary | ICD-10-CM | POA: Diagnosis not present

## 2022-10-12 DIAGNOSIS — Z7901 Long term (current) use of anticoagulants: Secondary | ICD-10-CM | POA: Diagnosis not present

## 2022-10-12 DIAGNOSIS — J45909 Unspecified asthma, uncomplicated: Secondary | ICD-10-CM | POA: Diagnosis not present

## 2022-10-12 DIAGNOSIS — I10 Essential (primary) hypertension: Secondary | ICD-10-CM | POA: Diagnosis not present

## 2022-10-12 DIAGNOSIS — N134 Hydroureter: Secondary | ICD-10-CM | POA: Diagnosis not present

## 2022-10-12 DIAGNOSIS — E876 Hypokalemia: Secondary | ICD-10-CM | POA: Diagnosis not present

## 2022-10-12 DIAGNOSIS — K760 Fatty (change of) liver, not elsewhere classified: Secondary | ICD-10-CM | POA: Diagnosis not present

## 2022-10-12 DIAGNOSIS — D72829 Elevated white blood cell count, unspecified: Secondary | ICD-10-CM | POA: Diagnosis not present

## 2022-10-12 LAB — CBC WITH DIFFERENTIAL/PLATELET
Abs Immature Granulocytes: 0.03 10*3/uL (ref 0.00–0.07)
Basophils Absolute: 0 10*3/uL (ref 0.0–0.1)
Basophils Relative: 0 %
Eosinophils Absolute: 0.1 10*3/uL (ref 0.0–0.5)
Eosinophils Relative: 1 %
HCT: 45.2 % (ref 36.0–46.0)
Hemoglobin: 14.3 g/dL (ref 12.0–15.0)
Immature Granulocytes: 0 %
Lymphocytes Relative: 11 %
Lymphs Abs: 1.3 10*3/uL (ref 0.7–4.0)
MCH: 30.2 pg (ref 26.0–34.0)
MCHC: 31.6 g/dL (ref 30.0–36.0)
MCV: 95.6 fL (ref 80.0–100.0)
Monocytes Absolute: 0.7 10*3/uL (ref 0.1–1.0)
Monocytes Relative: 5 %
Neutro Abs: 10.2 10*3/uL — ABNORMAL HIGH (ref 1.7–7.7)
Neutrophils Relative %: 83 %
Platelets: 203 10*3/uL (ref 150–400)
RBC: 4.73 MIL/uL (ref 3.87–5.11)
RDW: 13.6 % (ref 11.5–15.5)
WBC: 12.3 10*3/uL — ABNORMAL HIGH (ref 4.0–10.5)
nRBC: 0 % (ref 0.0–0.2)

## 2022-10-12 LAB — COMPREHENSIVE METABOLIC PANEL
ALT: 48 U/L — ABNORMAL HIGH (ref 0–44)
AST: 41 U/L (ref 15–41)
Albumin: 4 g/dL (ref 3.5–5.0)
Alkaline Phosphatase: 42 U/L (ref 38–126)
Anion gap: 12 (ref 5–15)
BUN: 13 mg/dL (ref 6–20)
CO2: 15 mmol/L — ABNORMAL LOW (ref 22–32)
Calcium: 10.1 mg/dL (ref 8.9–10.3)
Chloride: 110 mmol/L (ref 98–111)
Creatinine, Ser: 1.05 mg/dL — ABNORMAL HIGH (ref 0.44–1.00)
GFR, Estimated: >60 mL/min (ref >60)
Glucose, Bld: 116 mg/dL — ABNORMAL HIGH (ref 70–99)
Potassium: 3.1 mmol/L — ABNORMAL LOW (ref 3.5–5.1)
Sodium: 137 mmol/L (ref 135–145)
Total Bilirubin: 0.8 mg/dL (ref 0.3–1.2)
Total Protein: 7.6 g/dL (ref 6.5–8.1)

## 2022-10-12 LAB — URINALYSIS, ROUTINE W REFLEX MICROSCOPIC
Bacteria, UA: NONE SEEN
Bilirubin Urine: NEGATIVE
Glucose, UA: NEGATIVE mg/dL
Ketones, ur: NEGATIVE mg/dL
Leukocytes,Ua: NEGATIVE
Nitrite: NEGATIVE
Protein, ur: NEGATIVE mg/dL
RBC / HPF: >50 RBC/hpf (ref 0–5)
Specific Gravity, Urine: 1.019 (ref 1.005–1.030)
pH: 6 (ref 5.0–8.0)

## 2022-10-12 LAB — GLUCOSE, CAPILLARY: Glucose-Capillary: 134 mg/dL — ABNORMAL HIGH (ref 70–99)

## 2022-10-12 LAB — MAGNESIUM: Magnesium: 1.9 mg/dL (ref 1.7–2.4)

## 2022-10-12 LAB — LACTIC ACID, PLASMA: Lactic Acid, Venous: 1.6 mmol/L (ref 0.5–1.9)

## 2022-10-12 MED ORDER — PROCHLORPERAZINE EDISYLATE 10 MG/2ML IJ SOLN
5.0000 mg | Freq: Four times a day (QID) | INTRAMUSCULAR | Status: DC | PRN
  Administered 2022-10-12: 5 mg via INTRAVENOUS
  Filled 2022-10-12: qty 2

## 2022-10-12 MED ORDER — OXYCODONE-ACETAMINOPHEN 5-325 MG PO TABS
1.0000 | ORAL_TABLET | Freq: Once | ORAL | Status: AC
  Administered 2022-10-12: 1 via ORAL
  Filled 2022-10-12: qty 1

## 2022-10-12 MED ORDER — IOHEXOL 300 MG/ML  SOLN
100.0000 mL | Freq: Once | INTRAMUSCULAR | Status: AC | PRN
  Administered 2022-10-12: 100 mL via INTRAVENOUS

## 2022-10-12 MED ORDER — POTASSIUM CHLORIDE 2 MEQ/ML IV SOLN
INTRAVENOUS | Status: DC
  Filled 2022-10-12: qty 1000

## 2022-10-12 MED ORDER — SENNOSIDES-DOCUSATE SODIUM 8.6-50 MG PO TABS
1.0000 | ORAL_TABLET | Freq: Every day | ORAL | Status: DC
  Administered 2022-10-12: 1 via ORAL
  Filled 2022-10-12: qty 1

## 2022-10-12 MED ORDER — MELATONIN 3 MG PO TABS: 3.0000 mg | ORAL_TABLET | Freq: Every evening | ORAL | Status: DC | PRN

## 2022-10-12 MED ORDER — POLYETHYLENE GLYCOL 3350 17 G PO PACK: 17.0000 g | PACK | Freq: Every day | ORAL | Status: DC | PRN

## 2022-10-12 MED ORDER — FENTANYL CITRATE PF 50 MCG/ML IJ SOSY
25.0000 ug | PREFILLED_SYRINGE | Freq: Once | INTRAMUSCULAR | Status: AC
  Administered 2022-10-12: 25 ug via INTRAVENOUS
  Filled 2022-10-12: qty 1

## 2022-10-12 MED ORDER — HYDROMORPHONE HCL 1 MG/ML IJ SOLN
0.5000 mg | Freq: Once | INTRAMUSCULAR | Status: AC
  Administered 2022-10-12: 0.5 mg via INTRAVENOUS
  Filled 2022-10-12: qty 1

## 2022-10-12 MED ORDER — APIXABAN 5 MG PO TABS
5.0000 mg | ORAL_TABLET | Freq: Two times a day (BID) | ORAL | Status: DC
  Administered 2022-10-12 – 2022-10-13 (×2): 5 mg via ORAL
  Filled 2022-10-12 (×2): qty 1

## 2022-10-12 MED ORDER — INSULIN ASPART 100 UNIT/ML IJ SOLN
0.0000 [IU] | Freq: Three times a day (TID) | INTRAMUSCULAR | Status: DC
  Filled 2022-10-12: qty 0.09

## 2022-10-12 MED ORDER — HYDRALAZINE HCL 20 MG/ML IJ SOLN
5.0000 mg | Freq: Four times a day (QID) | INTRAMUSCULAR | Status: DC | PRN
  Administered 2022-10-12: 5 mg via INTRAVENOUS
  Filled 2022-10-12: qty 1

## 2022-10-12 MED ORDER — ACETAZOLAMIDE 250 MG PO TABS
250.0000 mg | ORAL_TABLET | Freq: Two times a day (BID) | ORAL | Status: DC
  Administered 2022-10-12 – 2022-10-13 (×2): 250 mg via ORAL
  Filled 2022-10-12 (×3): qty 1

## 2022-10-12 MED ORDER — ONDANSETRON HCL 4 MG/2ML IJ SOLN
4.0000 mg | Freq: Once | INTRAMUSCULAR | Status: AC
  Administered 2022-10-12: 4 mg via INTRAVENOUS
  Filled 2022-10-12: qty 2

## 2022-10-12 MED ORDER — ACETAMINOPHEN 325 MG PO TABS: 650.0000 mg | ORAL_TABLET | Freq: Four times a day (QID) | ORAL | Status: DC | PRN

## 2022-10-12 MED ORDER — POTASSIUM CHLORIDE CRYS ER 20 MEQ PO TBCR
40.0000 meq | EXTENDED_RELEASE_TABLET | Freq: Two times a day (BID) | ORAL | Status: DC
  Administered 2022-10-12: 40 meq via ORAL
  Filled 2022-10-12: qty 2

## 2022-10-12 MED ORDER — HYDROMORPHONE HCL 1 MG/ML IJ SOLN
0.5000 mg | INTRAMUSCULAR | Status: DC | PRN
  Administered 2022-10-13: 0.5 mg via INTRAVENOUS
  Filled 2022-10-12 (×2): qty 0.5

## 2022-10-12 MED ORDER — INSULIN ASPART 100 UNIT/ML IJ SOLN
0.0000 [IU] | Freq: Every day | INTRAMUSCULAR | Status: DC
  Filled 2022-10-12: qty 0.05

## 2022-10-12 MED ORDER — SODIUM CHLORIDE 0.9 % IV BOLUS
1000.0000 mL | Freq: Once | INTRAVENOUS | Status: AC
  Administered 2022-10-12: 1000 mL via INTRAVENOUS

## 2022-10-12 MED ORDER — MORPHINE SULFATE (PF) 4 MG/ML IV SOLN
4.0000 mg | Freq: Once | INTRAVENOUS | Status: AC
  Administered 2022-10-12: 4 mg via INTRAVENOUS
  Filled 2022-10-12: qty 1

## 2022-10-12 MED ORDER — LISINOPRIL 20 MG PO TABS: 20.0000 mg | ORAL_TABLET | Freq: Every day | ORAL | Status: DC

## 2022-10-12 MED ORDER — ADULT MULTIVITAMIN W/MINERALS CH
1.0000 | ORAL_TABLET | Freq: Every day | ORAL | Status: DC
  Administered 2022-10-13: 1 via ORAL
  Filled 2022-10-12: qty 1

## 2022-10-12 MED ORDER — MONTELUKAST SODIUM 10 MG PO TABS
10.0000 mg | ORAL_TABLET | Freq: Every day | ORAL | Status: DC
  Administered 2022-10-12: 10 mg via ORAL
  Filled 2022-10-12: qty 1

## 2022-10-12 MED ORDER — OXYCODONE HCL 5 MG PO TABS
5.0000 mg | ORAL_TABLET | Freq: Four times a day (QID) | ORAL | Status: DC | PRN
  Administered 2022-10-12: 5 mg via ORAL
  Filled 2022-10-12: qty 1

## 2022-10-12 MED ORDER — HYDROMORPHONE HCL 1 MG/ML IJ SOLN
1.0000 mg | Freq: Once | INTRAMUSCULAR | Status: AC
  Administered 2022-10-12: 1 mg via INTRAVENOUS
  Filled 2022-10-12: qty 1

## 2022-10-12 NOTE — Plan of Care (Signed)
  Problem: Fluid Volume: Goal: Ability to maintain a balanced intake and output will improve Outcome: Not Progressing   Problem: Metabolic: Goal: Ability to maintain appropriate glucose levels will improve Outcome: Not Progressing   Problem: Pain Managment: Goal: General experience of comfort will improve Outcome: Not Progressing   Problem: Safety: Goal: Ability to remain free from injury will improve Outcome: Not Progressing   Problem: Elimination: Goal: Will not experience complications related to urinary retention Outcome: Not Progressing

## 2022-10-12 NOTE — Telephone Encounter (Signed)
Called patient, c/o severe LLQ abd pain with severe nausea. She vomiting this afternoon and has no appetite. No fever but has chills.  She had sigmoid polyp removed with hot snare, will need to exclude post polypectomy coagulation syndrome.  Advised patient to come to ER for evaluation, IV fluids and possible CT abd & pelvis

## 2022-10-12 NOTE — Telephone Encounter (Signed)
  Follow up Call-     10/11/2022    8:56 AM  Call back number  Post procedure Call Back phone  # [phone]  Permission to leave phone message Yes     Patient questions:  Do you have a fever, pain , or abdominal swelling? No. Pain Score  0 *  Have you tolerated food without any problems? Yes.    Have you been able to return to your normal activities? Yes.    Do you have any questions about your discharge instructions: Diet   No. Medications  No. Follow up visit  No.  Do you have questions or concerns about your Care? Yes.    Actions: * If pain score is 4 or above: No action needed, pain <4.

## 2022-10-12 NOTE — Telephone Encounter (Signed)
Patient called back stating that she has been experiencing pain on her left side, as well as nausea and vomiting. Patient is requesting a call back to discuss. Please advise.

## 2022-10-12 NOTE — H&P (Addendum)
History and Physical  BHUMI Mills [id number] DOB: [birth year] DOA: 10/12/2022  Referring physician: Diamantina Monks, PA-EDP  PCP: Sharion Balloon, FNP  Outpatient Specialists: GI, neurology, hematology/oncology. Patient coming from: Home.  Chief Complaint: Severe left lower quadrant abdominal pain  HPI: [name] is a 52 y.o. female with medical history significant for severe morbid obesity, hypertension, unprovoked bilateral pulmonary embolism April 2022, right lower extremity DVT on Eliquis indefinitely, idiopathic intracranial hypertension on Diamox, history of nephrolithiasis a year ago, who presented to [facility] ED from home at the recommendation of her gastroenterologist due to severe left lower quadrant abdominal pain.  Associated with nausea and vomiting, onset hours prior to presentation.    The patient had a colonoscopy done yesterday as part of a screening for colorectal malignant neoplasm.  Status post biopsy polypectomy x 3.  Eliquis was held 2 days prior to the procedure and replaced by full dose subcu Lovenox, twice daily during those 2 days.  Subcu Lovenox was held on the day of the procedure and the patient resumed Eliquis today.  Due to her severe left lower quadrant abdominal pain and vomiting she called her gastroenterologist who recommended she comes to the ED for further evaluation and management of her symptomatology.  In the ED, afebrile, with elevated BPs.  UA was negative for pyuria.  CT abdomen pelvis with contrast showed mild left perinephric fat stranding and mild left hydronephrosis and hydroureter, secondary to a 5 mm stone at the left UVJ.  Hepatic steatosis.  9 mm intrarenal stone on the left.  She received several rounds of IV narcotic based analgesics in the ED without improvement of her pain.  EDP requested admission for pain control and management.  ED Course: Tmax 97.9.  BP 141/105, pulse 95, respiratory 20, saturation 100% on room air.  Lab studies remarkable  for serum potassium 3.1, serum bicarb 15, creatinine 1.05 with GFR greater than 60.  ALT 48.  WBC 12.3.  Review of Systems: Review of systems as noted in the HPI. All other systems reviewed and are negative.   Past Medical History:  Diagnosis Date   Asthma    Chiari I malformation ([place])    Chiari malformation 1996   decompression    High blood pressure    Hypertension    Idiopathic intracranial hypertension    Kidney stone    Lactose intolerance    Obesity    Pseudotumor cerebri    Subclavian bypass stenosis ([place]) 07/1996   right   Swallowing difficulty    Past Surgical History:  Procedure Laterality Date   APPENDECTOMY     chiari decompression     GANGLION CYST EXCISION Right    wrist   LAPAROSCOPIC APPENDECTOMY N/A 10/28/2015   Procedure: APPENDECTOMY LAPAROSCOPIC;  Surgeon: Armandina Gemma, MD;  Location: [facility];  Service: General;  Laterality: N/A;   SUBCLAVIAN BYPASS GRAFT Right 07/1996   WRIST FRACTURE SURGERY Right 02/2007    Social History:  reports that she has never smoked. She has never used smokeless tobacco. She reports that she does not drink alcohol and does not use drugs.   Allergies  Allergen Reactions   Codeine Other (See Comments)    "whole body turned bright red, swelled a little" with V codiene   Lyrica [Pregabalin] Other (See Comments)    "suicidal thoughts"   Nyquil Multi-Symptom [Pseudoeph-Doxylamine-Dm-Apap] Other (See Comments)    Turns skin red *all over body*, pt reports getting very hot    Sulfa Antibiotics Other (  See Comments)    "body turned red"    Family History  Problem Relation Age of Onset   Cataracts Mother    Diabetes Mother    Hypertension Mother    High Cholesterol Mother    Obesity Mother    Diabetes Father    Hypertension Father    Obesity Father    Atrial fibrillation Father    Thyroid cancer Maternal Great-grandmother    Lung cancer Paternal Grandmother       Prior to Admission medications   Medication Sig Start  Date End Date Taking? Authorizing Provider  acetaZOLAMIDE (DIAMOX) 250 MG tablet Take 1 tablet (250 mg total) by mouth 2 (two) times daily. 08/13/22   Lomax, Amy, NP  albuterol (VENTOLIN HFA) 108 (90 Base) MCG/ACT inhaler Inhale 2 puffs into the lungs every 4 hours as needed for wheezing or shortness of breath. 09/18/21   Sharion Balloon, FNP  apixaban (ELIQUIS) 5 MG TABS tablet Take 1 tablet (53m) by mouth twice daily. 04/30/22   HSharion Balloon FNP  cetirizine (ZYRTEC) 5 MG tablet Take 5 mg by mouth daily.    [provider]  cholecalciferol (VITAMIN D3) 25 MCG (1000 UT) tablet Take 2,000 Units by mouth daily.     [provider]  Cyanocobalamin (VITAMIN B 12 PO) Take 5,000 mcg by mouth daily.     [provider]  Echinacea 125 MG CAPS Take 125 mg by mouth daily.     [provider]  enoxaparin (LOVENOX) 120 MG/0.8ML injection Inject 0.8 mLs (120 mg total) into the skin every 12 (twelve) hours for 2 days. 10/04/22 10/11/22  HEvelina DunA, FNP  Insulin Pen Needle (UNIFINE PENTIPS) 32G X 4 MM MISC USE WITH SAXENDA TO INJECT INTO SKIN DAILY 03/02/21   Danford, KValetta FullerD, NP  Liraglutide -Weight Management (SAXENDA) 18 MG/3ML SOPN Inject 3 mg into the skin daily. 04/16/22   TEverardo Pacific FNP  lisinopril-hydrochlorothiazide (ZESTORETIC) 20-12.5 MG tablet TAKE 1 TABLET BY MOUTH ONCE A DAY 09/19/22 09/19/23  HEvelina DunA, FNP  Magnesium 250 MG TABS Take 250 mg by mouth daily.     [provider]  metroNIDAZOLE (METROCREAM) 0.75 % cream Apply topically 2 (two) times daily. 09/18/21   HEvelina DunA, FNP  montelukast (SINGULAIR) 10 MG tablet Take 1 tablet (10 mg total) by mouth at bedtime. 06/14/22   HSharion Balloon FNP  Multiple Vitamin (MULTIVITAMIN) capsule Take 1 capsule by mouth daily.    [provider]  Oxymetazoline HCl (NASAL SPRAY NA) Place 2 sprays into the nose as needed (CONGESTION).     [provider]  potassium chloride  SA (KLOR-CON M) 20 MEQ tablet Take 1 tablet (20 mEq total) by mouth daily. Please schedule appt for future refills. 1st attempt 07/31/22   HMinus Breeding MD    Physical Exam: BP (!) 154/82   Pulse 88   Temp 97.9 F (36.6 C) (Oral)   Resp 13   LMP 09/18/2022   SpO2 94%   General: 52y.o. year-old female well developed well nourished in no acute distress.  Alert and oriented x3. Cardiovascular: Regular rate and rhythm with no rubs or gallops.  No thyromegaly or JVD noted.  No lower extremity edema. 2/4 pulses in all 4 extremities. Respiratory: Clear to auscultation with no wheezes or rales. Good inspiratory effort. Abdomen: Soft nontender nondistended with normal bowel sounds x4 quadrants. Muskuloskeletal: No cyanosis, clubbing or edema noted bilaterally Neuro: CN II-XII intact, strength,  sensation, reflexes Skin: No ulcerative lesions noted or rashes Psychiatry: Judgement and insight appear normal. Mood is appropriate for condition and setting          Labs on Admission:  Basic Metabolic Panel: Recent Labs  Lab 10/12/22 1634  NA 137  K 3.1*  CL 110  CO2 15*  GLUCOSE 116*  BUN 13  CREATININE 1.05*  CALCIUM 10.1   Liver Function Tests: Recent Labs  Lab 10/12/22 1634  AST 41  ALT 48*  ALKPHOS 42  BILITOT 0.8  PROT 7.6  ALBUMIN 4.0   No results for input(s): "LIPASE", "AMYLASE" in the last 168 hours. No results for input(s): "AMMONIA" in the last 168 hours. CBC: Recent Labs  Lab 10/12/22 1634  WBC 12.3*  NEUTROABS 10.2*  HGB 14.3  HCT 45.2  MCV 95.6  PLT 203   Cardiac Enzymes: No results for input(s): "CKTOTAL", "CKMB", "CKMBINDEX", "TROPONINI" in the last 168 hours.  BNP (last 3 results) No results for input(s): "BNP" in the last 8760 hours.  ProBNP (last 3 results) No results for input(s): "PROBNP" in the last 8760 hours.  CBG: No results for input(s): "GLUCAP" in the last 168 hours.  Radiological Exams on Admission: CT ABDOMEN PELVIS W  CONTRAST  Result Date: 10/12/2022 CLINICAL DATA:  Left lower quadrant pain EXAM: CT ABDOMEN AND PELVIS WITH CONTRAST TECHNIQUE: Multidetector CT imaging of the abdomen and pelvis was performed using the standard protocol following bolus administration of intravenous contrast. RADIATION DOSE REDUCTION: This exam was performed according to the departmental dose-optimization program which includes automated exposure control, adjustment of the mA and/or kV according to patient size and/or use of iterative reconstruction technique. CONTRAST:  1106m OMNIPAQUE IOHEXOL 300 MG/ML  SOLN COMPARISON:  CT 03/04/2021 FINDINGS: Lower chest: No acute abnormality. Hepatobiliary: Hepatic steatosis. No calcified gallstone or biliary dilatation Pancreas: Unremarkable. No pancreatic ductal dilatation or surrounding inflammatory changes. Spleen: Normal in size without focal abnormality. Adrenals/Urinary Tract: Adrenal glands are within normal limits. Intrarenal stone lower pole left kidney measuring 9 mm. Mild left perinephric fat stranding and mild left hydronephrosis and hydroureter, secondary to a 5 mm stone at the left UVJ. Stomach/Bowel: Stomach nonenlarged. No dilated small bowel. No acute bowel wall thickening. Postsurgical changes at the cecum consistent with previous appendectomy. Vascular/Lymphatic: No significant vascular findings are present. No enlarged abdominal or pelvic lymph nodes. Reproductive: Uterus and bilateral adnexa are unremarkable. Other: Negative for pelvic effusion or free air. Small fat containing umbilical hernia Musculoskeletal: No acute or significant osseous findings. IMPRESSION: 1. Mild left perinephric fat stranding and mild left hydronephrosis and hydroureter, secondary to a 5 mm stone at the left UVJ. Hepatic steatosis. 2. 9 mm intrarenal stone on the left 3. Hepatic steatosis Electronically Signed   By: KDonavan FoilM.D.   On: 10/12/2022 18:02    EKG: I independently viewed the EKG done and my  findings are as followed: None available at the time of this visit.  Assessment/Plan Present on Admission: **None**  Principal Problem:   Nephrolithiasis  Nephrolithiasis, seen on CT abdomen and pelvis with contrast. CT abdomen pelvis with contrast showed mild left perinephric fat stranding and mild left hydronephrosis and hydroureter, secondary to a 5 mm stone at the left UVJ.  9 mm intrarenal stone on the left.   UA negative for pyuria She received several rounds of IV narcotic based analgesics without improvement of her pain. Pain control and bowel regimen in place. IV fluid hydration LR KCl 40 mill equivalent  at 50 cc/h x 1 day EDP discussed the case with urology, who will see in consultation.  N.p.o. after midnight.  Non anion gap metabolic acidosis Serum bicarb 15 anion gap 12 Suspect side effect from Diamox Monitor and repeat BMP in the AM  Leukocytosis, suspect reactive in the setting of kidney stones WBC 12.3 K Afebrile Non septic appearing UA negative for pyuria Monitor off antibiotics  Hypokalemia Serum potassium 3.1 Repleted intravenously with LR KCl 40 mill equivalent at 50 cc/h x 1 day, and orally with KCl 40 mill equivalent twice daily x 2 doses. Obtain magnesium level and replete if indicated  Idiopathic intracranial hypertension On Diamox Follows with neurology outpatient  History of pulmonary embolism and right lower extremity DVT, on Eliquis Continue home Eliquis as recommended by urology On anticoagulation indefinitely due to the unprovoked nature of her thrombosis, per hematology's outpatient note.  Hypertension, BP is not at goal, elevated IV hydralazine as needed with parameters Monitor vital signs      DVT prophylaxis: Home Eliquis  Code Status: Full code  Family Communication: Updated her mother and sister at bedside  Disposition Plan: Admitted to [facility] unit  Consults called: Urology consulted by EDP  Admission status:  Observation status   Status is: Observation    Kayleen Memos MD [facility] Pager [phone]  If 7PM-7AM, please contact night-coverage [email or web address] Password [facility]  10/12/2022, 8:53 PM

## 2022-10-12 NOTE — ED Triage Notes (Signed)
Pt had a colonoscopy yesterday and today she is now having left side abdomen and flank pain. No bloody stools.

## 2022-10-12 NOTE — ED Provider Notes (Cosign Needed Addendum)
[place] EMERGENCY DEPARTMENT AT [facility] Provider Note   CSN: [place]:[id number] Arrival date & time: 10/12/22  1522     History  Chief Complaint  Patient presents with   Abdominal Pain    [name] is a 52 y.o. female, history of appendectomy, PE, who presents to the ED secondary to left lower quadrant pain that started this a.m., and that has progressively gotten worse.  She states that she had a colonoscopy yesterday, her first colonoscopy, and was doing well just felt a little bit crampy afterwards, when she started having more pain today, called her surgeon, and was told lying several different positions take Tylenol ibuprofen, which she took without any relief.  She states the pain is severe, and is now caused retching, and 3 episodes of vomitus.  Vomitus is nonbloody, and she is passing Gas, and as she has had 1 bowel movement.  She is on blood thinners, has been compliant, held them for couple days, but was on subcutaneous Lovenox during that time.    Home Medications Prior to Admission medications   Medication Sig Start Date End Date Taking? Authorizing Provider  acetaZOLAMIDE (DIAMOX) 250 MG tablet Take 1 tablet (250 mg total) by mouth 2 (two) times daily. 08/13/22   Lomax, Amy, NP  albuterol (VENTOLIN HFA) 108 (90 Base) MCG/ACT inhaler Inhale 2 puffs into the lungs every 4 hours as needed for wheezing or shortness of breath. 09/18/21   Sharion Balloon, FNP  apixaban (ELIQUIS) 5 MG TABS tablet Take 1 tablet (11m) by mouth twice daily. 04/30/22   HSharion Balloon FNP  cetirizine (ZYRTEC) 5 MG tablet Take 5 mg by mouth daily.    [provider]  cholecalciferol (VITAMIN D3) 25 MCG (1000 UT) tablet Take 2,000 Units by mouth daily.     [provider]  Cyanocobalamin (VITAMIN B 12 PO) Take 5,000 mcg by mouth daily.     [provider]  Echinacea 125 MG CAPS Take 125 mg by mouth daily.     [provider]  enoxaparin (LOVENOX) 120  MG/0.8ML injection Inject 0.8 mLs (120 mg total) into the skin every 12 (twelve) hours for 2 days. 10/04/22 10/11/22  HEvelina DunA, FNP  Insulin Pen Needle (UNIFINE PENTIPS) 32G X 4 MM MISC USE WITH SAXENDA TO INJECT INTO SKIN DAILY 03/02/21   Danford, KValetta FullerD, NP  Liraglutide -Weight Management (SAXENDA) 18 MG/3ML SOPN Inject 3 mg into the skin daily. 04/16/22   TEverardo Pacific FNP  lisinopril-hydrochlorothiazide (ZESTORETIC) 20-12.5 MG tablet TAKE 1 TABLET BY MOUTH ONCE A DAY 09/19/22 09/19/23  HEvelina DunA, FNP  Magnesium 250 MG TABS Take 250 mg by mouth daily.     [provider]  metroNIDAZOLE (METROCREAM) 0.75 % cream Apply topically 2 (two) times daily. 09/18/21   HEvelina DunA, FNP  montelukast (SINGULAIR) 10 MG tablet Take 1 tablet (10 mg total) by mouth at bedtime. 06/14/22   HSharion Balloon FNP  Multiple Vitamin (MULTIVITAMIN) capsule Take 1 capsule by mouth daily.    [provider]  Oxymetazoline HCl (NASAL SPRAY NA) Place 2 sprays into the nose as needed (CONGESTION).     [provider]  potassium chloride SA (KLOR-CON M) 20 MEQ tablet Take 1 tablet (20 mEq total) by mouth daily. Please schedule appt for future refills. 1st attempt 07/31/22   HMinus Breeding MD      Allergies    Codeine, Lyrica [pregabalin], Nyquil multi-symptom [pseudoeph-doxylamine-dm-apap], and Sulfa  antibiotics    Review of Systems   Review of Systems  Gastrointestinal:  Positive for abdominal pain, nausea and vomiting. Negative for diarrhea.    Physical Exam Updated Vital Signs BP (!) 154/82   Pulse 88   Temp 97.9 F (36.6 C) (Oral)   Resp 13   LMP 09/18/2022   SpO2 94%  Physical Exam Vitals and nursing note reviewed.  Constitutional:      General: She is not in acute distress.    Appearance: She is well-developed.  HENT:     Head: Normocephalic and atraumatic.  Eyes:     Conjunctiva/sclera: Conjunctivae normal.  Cardiovascular:     Rate and Rhythm:  Normal rate and regular rhythm.     Heart sounds: No murmur heard. Pulmonary:     Effort: Pulmonary effort is normal. No respiratory distress.     Breath sounds: Normal breath sounds.  Abdominal:     Palpations: Abdomen is soft.     Tenderness: There is abdominal tenderness in the left lower quadrant. There is guarding and rebound.  Musculoskeletal:        General: No swelling.     Cervical back: Neck supple.  Skin:    General: Skin is warm and dry.     Capillary Refill: Capillary refill takes less than 2 seconds.  Neurological:     Mental Status: She is alert.  Psychiatric:        Mood and Affect: Mood normal.     ED Results / Procedures / Treatments   Labs (all labs ordered are listed, but only abnormal results are displayed) Labs Reviewed  CBC WITH DIFFERENTIAL/PLATELET - Abnormal; Notable for the following components:      Result Value   WBC 12.3 (*)    Neutro Abs 10.2 (*)    All other components within normal limits  COMPREHENSIVE METABOLIC PANEL - Abnormal; Notable for the following components:   Potassium 3.1 (*)    CO2 15 (*)    Glucose, Bld 116 (*)    Creatinine, Ser 1.05 (*)    ALT 48 (*)    All other components within normal limits  URINALYSIS, ROUTINE W REFLEX MICROSCOPIC - Abnormal; Notable for the following components:   APPearance HAZY (*)    Hgb urine dipstick MODERATE (*)    All other components within normal limits  LACTIC ACID, PLASMA  HEMOGLOBIN A1C  HIV ANTIBODY (ROUTINE TESTING W REFLEX)  CBC  BASIC METABOLIC PANEL  MAGNESIUM  PHOSPHORUS    EKG None  Radiology CT ABDOMEN PELVIS W CONTRAST  Result Date: 10/12/2022 CLINICAL DATA:  Left lower quadrant pain EXAM: CT ABDOMEN AND PELVIS WITH CONTRAST TECHNIQUE: Multidetector CT imaging of the abdomen and pelvis was performed using the standard protocol following bolus administration of intravenous contrast. RADIATION DOSE REDUCTION: This exam was performed according to the departmental  dose-optimization program which includes automated exposure control, adjustment of the mA and/or kV according to patient size and/or use of iterative reconstruction technique. CONTRAST:  157m OMNIPAQUE IOHEXOL 300 MG/ML  SOLN COMPARISON:  CT 03/04/2021 FINDINGS: Lower chest: No acute abnormality. Hepatobiliary: Hepatic steatosis. No calcified gallstone or biliary dilatation Pancreas: Unremarkable. No pancreatic ductal dilatation or surrounding inflammatory changes. Spleen: Normal in size without focal abnormality. Adrenals/Urinary Tract: Adrenal glands are within normal limits. Intrarenal stone lower pole left kidney measuring 9 mm. Mild left perinephric fat stranding and mild left hydronephrosis and hydroureter, secondary to a 5 mm stone at the left UVJ. Stomach/Bowel: Stomach nonenlarged. No  dilated Jaylenn Altier bowel. No acute bowel wall thickening. Postsurgical changes at the cecum consistent with previous appendectomy. Vascular/Lymphatic: No significant vascular findings are present. No enlarged abdominal or pelvic lymph nodes. Reproductive: Uterus and bilateral adnexa are unremarkable. Other: Negative for pelvic effusion or free air. Asiel Chrostowski fat containing umbilical hernia Musculoskeletal: No acute or significant osseous findings. IMPRESSION: 1. Mild left perinephric fat stranding and mild left hydronephrosis and hydroureter, secondary to a 5 mm stone at the left UVJ. Hepatic steatosis. 2. 9 mm intrarenal stone on the left 3. Hepatic steatosis Electronically Signed   By: Donavan Foil M.D.   On: 10/12/2022 18:02    Procedures Procedures    Medications Ordered in ED Medications  insulin aspart (novoLOG) injection 0-9 Units (has no administration in time range)  insulin aspart (novoLOG) injection 0-5 Units (has no administration in time range)  acetaminophen (TYLENOL) tablet 650 mg (has no administration in time range)  oxyCODONE (Oxy IR/ROXICODONE) immediate release tablet 5 mg (has no administration in time  range)  HYDROmorphone (DILAUDID) injection 0.5 mg (has no administration in time range)  senna-docusate (Senokot-S) tablet 1 tablet (has no administration in time range)  polyethylene glycol (MIRALAX / GLYCOLAX) packet 17 g (has no administration in time range)  prochlorperazine (COMPAZINE) injection 5 mg (has no administration in time range)  melatonin tablet 3 mg (has no administration in time range)  acetaZOLAMIDE (DIAMOX) tablet 250 mg (has no administration in time range)  apixaban (ELIQUIS) tablet 5 mg (has no administration in time range)  multivitamin capsule 1 capsule (has no administration in time range)  montelukast (SINGULAIR) tablet 10 mg (has no administration in time range)  sodium chloride 0.9 % bolus 1,000 mL (1,000 mLs Intravenous New Bag/Given 10/12/22 1644)  ondansetron (ZOFRAN) injection 4 mg (4 mg Intravenous Given 10/12/22 1639)  HYDROmorphone (DILAUDID) injection 0.5 mg (0.5 mg Intravenous Given 10/12/22 1638)  iohexol (OMNIPAQUE) 300 MG/ML solution 100 mL (100 mLs Intravenous Contrast Given 10/12/22 1733)  HYDROmorphone (DILAUDID) injection 1 mg (1 mg Intravenous Given 10/12/22 1808)  morphine (PF) 4 MG/ML injection 4 mg (4 mg Intravenous Given 10/12/22 1923)  oxyCODONE-acetaminophen (PERCOCET/ROXICET) 5-325 MG per tablet 1 tablet (1 tablet Oral Given 10/12/22 2001)    ED Course/ Medical Decision Making/ A&P                           Medical Decision Making Patient is a 52 year old female, here for severe abdominal pain after having colonoscopy yesterday.  She presents with nausea, vomiting intractable pain.  Called her GI doctor, today, Hu recommended that she try multiple positions Tylenol and ibuprofen which she states has not helped with any of the pain.  We will obtain a CT abdomen pelvis as she is guarding and has rebound on exam, and appears to be acutely in distress.  Additionally obtain CBC, CMP urinalysis and lactic acid to rule out any infarct.  Amount and/or Complexity  of Data Reviewed Labs: ordered.    Details: Labs show mild leukocytosis of 12.3 K, slight dehydration with creatinine of 1.05 and hazy urine. Radiology: ordered.    Details: CT abdomen pelvis shows 5 mm stone at the UVJ, with hydronephrosis Discussion of management or test interpretation with external provider(s): Discussed with Dr. Nevada Crane, he recommends IV fluids, and states surgically there is not much that they can do, states that admit to hospitalist, keep n.p.o. after midnight, and possible procedure in the a.m.  Discussed with Dr. Nevada Crane,  she agrees for admission.  Patient to be kept n.p.o. after midnight, push fluids, pain control, and nausea control.  Patient specifically admitted secondary to pain control, intractable pain after 3 doses of IV medications, and 1 dose of oral medication.  Risk Prescription drug management. Decision regarding hospitalization.    Final Clinical Impression(s) / ED Diagnoses Final diagnoses:  Ureteral stone with hydronephrosis  Kidney stone    Rx / DC Orders ED Discharge Orders     None         Diane Hanel, Si Gaul, PA 10/12/22 2100    Osvaldo Shipper, PA 10/12/22 2101    Valarie Merino, MD 10/13/22 1202

## 2022-10-12 NOTE — Telephone Encounter (Signed)
Returned pts call.  She states that her pain is on the left side of her abdomen at the level of her belly button.  She describes it as constant pain.  At its worse it is 2009/05/02.  She has passed air and had a bowel movement today.  She ate some buttered toast and threw that up but hasn't had any more vomiting.  Please advise.

## 2022-10-13 DIAGNOSIS — N201 Calculus of ureter: Secondary | ICD-10-CM | POA: Diagnosis not present

## 2022-10-13 DIAGNOSIS — N1330 Unspecified hydronephrosis: Secondary | ICD-10-CM | POA: Diagnosis not present

## 2022-10-13 DIAGNOSIS — N200 Calculus of kidney: Secondary | ICD-10-CM

## 2022-10-13 LAB — CBC
HCT: 41.4 % (ref 36.0–46.0)
Hemoglobin: 13.4 g/dL (ref 12.0–15.0)
MCH: 30 pg (ref 26.0–34.0)
MCHC: 32.4 g/dL (ref 30.0–36.0)
MCV: 92.8 fL (ref 80.0–100.0)
Platelets: 193 10*3/uL (ref 150–400)
RBC: 4.46 MIL/uL (ref 3.87–5.11)
RDW: 13.6 % (ref 11.5–15.5)
WBC: 11.2 10*3/uL — ABNORMAL HIGH (ref 4.0–10.5)
nRBC: 0 % (ref 0.0–0.2)

## 2022-10-13 LAB — BASIC METABOLIC PANEL
Anion gap: 9 (ref 5–15)
BUN: 12 mg/dL (ref 6–20)
CO2: 16 mmol/L — ABNORMAL LOW (ref 22–32)
Calcium: 9.3 mg/dL (ref 8.9–10.3)
Chloride: 111 mmol/L (ref 98–111)
Creatinine, Ser: 1.29 mg/dL — ABNORMAL HIGH (ref 0.44–1.00)
GFR, Estimated: 51 mL/min — ABNORMAL LOW (ref >60)
Glucose, Bld: 151 mg/dL — ABNORMAL HIGH (ref 70–99)
Potassium: 3.5 mmol/L (ref 3.5–5.1)
Sodium: 136 mmol/L (ref 135–145)

## 2022-10-13 LAB — MAGNESIUM: Magnesium: 2 mg/dL (ref 1.7–2.4)

## 2022-10-13 LAB — HEMOGLOBIN A1C
Hgb A1c MFr Bld: 5.1 % (ref 4.8–5.6)
Mean Plasma Glucose: 99.67 mg/dL

## 2022-10-13 LAB — PHOSPHORUS: Phosphorus: 4.4 mg/dL (ref 2.5–4.6)

## 2022-10-13 LAB — GLUCOSE, CAPILLARY: Glucose-Capillary: 106 mg/dL — ABNORMAL HIGH (ref 70–99)

## 2022-10-13 LAB — HIV ANTIBODY (ROUTINE TESTING W REFLEX): HIV Screen 4th Generation wRfx: NONREACTIVE

## 2022-10-13 MED ORDER — SODIUM CHLORIDE 0.9 % IV SOLN: INTRAVENOUS | Status: DC

## 2022-10-13 MED ORDER — POTASSIUM CHLORIDE CRYS ER 20 MEQ PO TBCR
40.0000 meq | EXTENDED_RELEASE_TABLET | Freq: Once | ORAL | Status: AC
  Administered 2022-10-13: 40 meq via ORAL
  Filled 2022-10-13: qty 2

## 2022-10-13 NOTE — Discharge Summary (Signed)
Physician Discharge Summary  [name] [id number] DOB: 1971/06/05 DOA: 10/12/2022  PCP: Sharion Balloon, FNP  Admit date: 10/12/2022 Discharge date: 10/13/2022  Admitted From: Home Disposition:  Home  Discharge Condition:Stable CODE STATUS:FULL Diet recommendation: Heart Healthy   Brief/Interim Summary: Patient is a 52 year old female with history of morbid obesity, hypertension, bilateral PE, right lower extremity DVT on Eliquis, idiopathic intracranial hypertension on Diamox, nephrolithiasis who presented to the emergency department from home with complaint of severe left lower quadrant abdominal pain, nausea, vomiting.  Patient recently had a colonoscopy done as a screening for colorectal malignant neoplasm and underwent polypectomy/biopsy.  Patient developed severe left quadrant abdominal pain and vomiting and called her gastroenterologist who recommended to go to the emergency department.  On presentation she was hemodynamically stable.  CT abdomen/pelvis showed mild left perinephric fat stranding and mild left hydronephrosis and hydroureter, secondary to a 5 mm stone at the left UVJ, 5 mm intrarenal stone on the left.  Patient was given several doses of IV narcotics without improvement in the pain and was admitted for pain control.  Urology consulted . This morning she spontaneously passed the stone and her abdominal pain completely resolved.  Urology saw her and cleared for discharge and recommend outpatient follow-up.  Medically stable for discharge home today.  Following problems were addressed during the hospitalization:  Nephrolithiasis/intractable left lower quadrant pain:  Pain was most likely secondary to nephrolithiasis..  CT abdomen/pelvis showed mild left perinephric fat stranding and mild left hydronephrosis and hydroureter, secondary to a 5 mm stone at the left UVJ, 5 mm intrarenal stone on the left.  UA negative for pyuria.  This morning she spontaneously passed the  stone and her abdominal pain completely resolved.  Urology saw her and cleared for discharge and recommended outpatient follow-up  Non-anion gap metabolic acidosis: Patient is currently on Diamox.     Mild AKI: Creatinine trended up to 1.2 today. Monitor as an outpatient   Leukocytosis: Mild.  Patient is afebrile.  Nonseptic.  UA negative for pyuria.  Continue to hold antibiotics   Hypokalemia: Supplemented   Idiopathic intracranial hypertension: On Diamox.  Follows with neurology as an outpatient.   Morbid obesity: BMI of 44.2   History of PE/right lower extremity DVT: On Eliquis   Hypertension: Continue to monitor his blood pressure at home and take her meds  Discharge Diagnoses:  Principal Problem:   Nephrolithiasis    Discharge Instructions  Discharge Instructions     Diet - low sodium heart healthy   Complete by: As directed    Discharge instructions   Complete by: As directed    Please follow up with urology as an outpatient   Increase activity slowly   Complete by: As directed       Allergies as of 10/13/2022       Reactions   Codeine Other (See Comments)   "whole body turned bright red, swelled a little" with V codiene   Lyrica [pregabalin] Other (See Comments)   "suicidal thoughts"   Nyquil Multi-symptom [pseudoeph-doxylamine-dm-apap] Other (See Comments)   Turns skin red *all over body*, pt reports getting very hot    Sulfa Antibiotics Other (See Comments)   "body turned red"        Medication List     TAKE these medications    acetaZOLAMIDE 250 MG tablet Commonly known as: DIAMOX Take 1 tablet (250 mg total) by mouth 2 (two) times daily.   albuterol 108 (90 Base) MCG/ACT inhaler Commonly  known as: VENTOLIN HFA Inhale 2 puffs into the lungs every 4 hours as needed for wheezing or shortness of breath.   cetirizine 5 MG tablet Commonly known as: ZYRTEC Take 5 mg by mouth daily.   cholecalciferol 25 MCG (1000 UNIT) tablet Commonly known as:  VITAMIN D3 Take 2,000 Units by mouth daily.   Echinacea 125 MG Caps Take 125 mg by mouth daily.   Eliquis 5 MG Tabs tablet Generic drug: apixaban Take 1 tablet (85m) by mouth twice daily.   lisinopril-hydrochlorothiazide 20-12.5 MG tablet Commonly known as: ZESTORETIC TAKE 1 TABLET BY MOUTH ONCE A DAY   Magnesium 250 MG Tabs Take 250 mg by mouth daily.   metroNIDAZOLE 0.75 % cream Commonly known as: METROCREAM Apply topically 2 (two) times daily.   montelukast 10 MG tablet Commonly known as: SINGULAIR Take 1 tablet (10 mg total) by mouth at bedtime.   multivitamin capsule Take 1 capsule by mouth daily.   NASAL SPRAY NA Place 2 sprays into the nose as needed (CONGESTION).   potassium chloride SA 20 MEQ tablet Commonly known as: KLOR-CON M Take 1 tablet (20 mEq total) by mouth daily. Please schedule appt for future refills. 1st attempt   Saxenda 18 MG/3ML Sopn Generic drug: Liraglutide -Weight Management Inject 3 mg into the skin daily.   Unifine Pentips 32G X 4 MM Misc Generic drug: Insulin Pen Needle USE WITH SAXENDA TO INJECT INTO SKIN DAILY   VITAMIN B 12 PO Take 5,000 mcg by mouth daily.        Allergies  Allergen Reactions   Codeine Other (See Comments)    "whole body turned bright red, swelled a little" with V codiene   Lyrica [Pregabalin] Other (See Comments)    "suicidal thoughts"   Nyquil Multi-Symptom [Pseudoeph-Doxylamine-Dm-Apap] Other (See Comments)    Turns skin red *all over body*, pt reports getting very hot    Sulfa Antibiotics Other (See Comments)    "body turned red"    Consultations: Urology   Procedures/Studies: CT ABDOMEN PELVIS W CONTRAST  Result Date: 10/12/2022 CLINICAL DATA:  Left lower quadrant pain EXAM: CT ABDOMEN AND PELVIS WITH CONTRAST TECHNIQUE: Multidetector CT imaging of the abdomen and pelvis was performed using the standard protocol following bolus administration of intravenous contrast. RADIATION DOSE REDUCTION:  This exam was performed according to the departmental dose-optimization program which includes automated exposure control, adjustment of the mA and/or kV according to patient size and/or use of iterative reconstruction technique. CONTRAST:  1044mOMNIPAQUE IOHEXOL 300 MG/ML  SOLN COMPARISON:  CT 03/04/2021 FINDINGS: Lower chest: No acute abnormality. Hepatobiliary: Hepatic steatosis. No calcified gallstone or biliary dilatation Pancreas: Unremarkable. No pancreatic ductal dilatation or surrounding inflammatory changes. Spleen: Normal in size without focal abnormality. Adrenals/Urinary Tract: Adrenal glands are within normal limits. Intrarenal stone lower pole left kidney measuring 9 mm. Mild left perinephric fat stranding and mild left hydronephrosis and hydroureter, secondary to a 5 mm stone at the left UVJ. Stomach/Bowel: Stomach nonenlarged. No dilated small bowel. No acute bowel wall thickening. Postsurgical changes at the cecum consistent with previous appendectomy. Vascular/Lymphatic: No significant vascular findings are present. No enlarged abdominal or pelvic lymph nodes. Reproductive: Uterus and bilateral adnexa are unremarkable. Other: Negative for pelvic effusion or free air. Small fat containing umbilical hernia Musculoskeletal: No acute or significant osseous findings. IMPRESSION: 1. Mild left perinephric fat stranding and mild left hydronephrosis and hydroureter, secondary to a 5 mm stone at the left UVJ. Hepatic steatosis. 2. 9 mm intrarenal stone  on the left 3. Hepatic steatosis Electronically Signed   By: Donavan Foil M.D.   On: 10/12/2022 18:02      Subjective: Patient seen and examined at bedside today.  Hemodynamically stable.  Comfortable.  She passed the stone this morning.  No abdominal pain.  Tolerated diet  Discharge Exam: Vitals:   10/13/22 0510 10/13/22 0929  BP: (!) 94/57 106/61  Pulse: 77 89  Resp: 18 18  Temp: 98 F (36.7 C) 98.5 F (36.9 C)  SpO2: 100% 98%   Vitals:    10/12/22 2346 10/13/22 0057 10/13/22 0510 10/13/22 0929  BP: (!) 141/74 (!) 105/56 (!) 94/57 106/61  Pulse: 96 92 77 89  Resp:  18 18 18  $ Temp:  97.9 F (36.6 C) 98 F (36.7 C) 98.5 F (36.9 C)  TempSrc:  Oral Oral Oral  SpO2:  98% 100% 98%    General: Pt is alert, awake, not in acute distress,obese Cardiovascular: RRR, S1/S2 +, no rubs, no gallops Respiratory: CTA bilaterally, no wheezing, no rhonchi Abdominal: Soft, NT, ND, bowel sounds + Extremities: no edema, no cyanosis    The results of significant diagnostics from this hospitalization (including imaging, microbiology, ancillary and laboratory) are listed below for reference.     Microbiology: No results found for this or any previous visit (from the past 240 hour(s)).   Labs: BNP (last 3 results) No results for input(s): "BNP" in the last 8760 hours. Basic Metabolic Panel: Recent Labs  Lab 10/12/22 1634 10/13/22 0009  NA 137 136  K 3.1* 3.5  CL 110 111  CO2 15* 16*  GLUCOSE 116* 151*  BUN 13 12  CREATININE 1.05* 1.29*  CALCIUM 10.1 9.3  MG 1.9 2.0  PHOS  --  4.4   Liver Function Tests: Recent Labs  Lab 10/12/22 1634  AST 41  ALT 48*  ALKPHOS 42  BILITOT 0.8  PROT 7.6  ALBUMIN 4.0   No results for input(s): "LIPASE", "AMYLASE" in the last 168 hours. No results for input(s): "AMMONIA" in the last 168 hours. CBC: Recent Labs  Lab 10/12/22 1634 10/13/22 0009  WBC 12.3* 11.2*  NEUTROABS 10.2*  --   HGB 14.3 13.4  HCT 45.2 41.4  MCV 95.6 92.8  PLT 203 193   Cardiac Enzymes: No results for input(s): "CKTOTAL", "CKMB", "CKMBINDEX", "TROPONINI" in the last 168 hours. BNP: Invalid input(s): "POCBNP" CBG: Recent Labs  Lab 10/12/22 2204 10/13/22 0752  GLUCAP 134* 106*   D-Dimer No results for input(s): "DDIMER" in the last 72 hours. Hgb A1c Recent Labs    10/12/22 1634  HGBA1C 5.1   Lipid Profile No results for input(s): "CHOL", "HDL", "LDLCALC", "TRIG", "CHOLHDL", "LDLDIRECT" in  the last 72 hours. Thyroid function studies No results for input(s): "TSH", "T4TOTAL", "T3FREE", "THYROIDAB" in the last 72 hours.  Invalid input(s): "FREET3" Anemia work up No results for input(s): "VITAMINB12", "FOLATE", "FERRITIN", "TIBC", "IRON", "RETICCTPCT" in the last 72 hours. Urinalysis    Component Value Date/Time   COLORURINE YELLOW 10/12/2022 1651   APPEARANCEUR HAZY (A) 10/12/2022 1651   APPEARANCEUR Clear 03/01/2021 0955   LABSPEC 1.019 10/12/2022 1651   PHURINE 6.0 10/12/2022 1651   GLUCOSEU NEGATIVE 10/12/2022 1651   HGBUR MODERATE (A) 10/12/2022 1651   BILIRUBINUR NEGATIVE 10/12/2022 1651   BILIRUBINUR Negative 03/01/2021 [place] 10/12/2022 1651   PROTEINUR NEGATIVE 10/12/2022 1651   UROBILINOGEN 0.2 04/02/2010 1647   NITRITE NEGATIVE 10/12/2022 1651   LEUKOCYTESUR NEGATIVE 10/12/2022 1651  Sepsis Labs Recent Labs  Lab 10/12/22 1634 10/13/22 0009  WBC 12.3* 11.2*   Microbiology No results found for this or any previous visit (from the past 240 hour(s)).  Please note: You were cared for by a hospitalist during your hospital stay. Once you are discharged, your primary care physician will handle any further medical issues. Please note that NO REFILLS for any discharge medications will be authorized once you are discharged, as it is imperative that you return to your primary care physician (or establish a relationship with a primary care physician if you do not have one) for your post hospital discharge needs so that they can reassess your need for medications and monitor your lab values.    Time coordinating discharge: 40 minutes  SIGNED:   Shelly Coss, MD  [facility] 10/13/2022, 10:29 AM Pager [phone]  If 7PM-7AM, please contact night-coverage [email or web address] Password [email or web address]

## 2022-10-13 NOTE — Progress Notes (Signed)
  Transition of Care [facility]) Screening Note   Patient Details  Name: [name] Date of Birth: 02-13-1971   [facility] CM/SW Contact:    Henrietta Dine, RN Phone Number: 10/13/2022, 10:12 AM    Transition of Care Department [facility]) has reviewed patient and no TOC needs have been identified at this time. We will continue to monitor patient advancement through interdisciplinary progression rounds. If new patient transition needs arise, please place a TOC consult.

## 2022-10-13 NOTE — Plan of Care (Signed)

## 2022-10-13 NOTE — Consult Note (Signed)
Urology Consult   Physician requesting consult: [place] Crane  Reason for consult: Left distal ureteral calculus  History of Present Illness: [name] is a 52 y.o. white female with prior history of stones requiring ureteroscopy and laser lithotripsy.  Presented with acute onset left-sided flank pain after recent colonoscopy.  Had abdominal CT scan which shows a 5 mm left UVJ stone with moderate hydronephrosis.  She was admitted to the hospital service for pain management as they were unable to get pain managed in the ER.  Fortunately since being in the hospital she has passed her stone and is minimally symptomatic currently.  Stone is approximately 5 mm collected.  She denies a history of voiding or storage urinary symptoms, hematuria, UTIs, STDs, urolithiasis, GU malignancy/trauma/surgery.  Past Medical History:  Diagnosis Date   Asthma    Chiari I malformation ([place])    Chiari malformation 1996   decompression    High blood pressure    Hypertension    Idiopathic intracranial hypertension    Kidney stone    Lactose intolerance    Obesity    Pseudotumor cerebri    Subclavian bypass stenosis ([place]) 07/1996   right   Swallowing difficulty     Past Surgical History:  Procedure Laterality Date   APPENDECTOMY     chiari decompression     GANGLION CYST EXCISION Right    wrist   LAPAROSCOPIC APPENDECTOMY N/A 10/28/2015   Procedure: APPENDECTOMY LAPAROSCOPIC;  Surgeon: Armandina Gemma, MD;  Location: [facility];  Service: General;  Laterality: N/A;   SUBCLAVIAN BYPASS GRAFT Right 07/1996   WRIST FRACTURE SURGERY Right 02/2007     Current Hospital Medications:  Home meds:  No current facility-administered medications on file prior to encounter.   Current Outpatient Medications on File Prior to Encounter  Medication Sig Dispense Refill   acetaZOLAMIDE (DIAMOX) 250 MG tablet Take 1 tablet (250 mg total) by mouth 2 (two) times daily. 180 tablet 0   albuterol (VENTOLIN HFA) 108 (90 Base)  MCG/ACT inhaler Inhale 2 puffs into the lungs every 4 hours as needed for wheezing or shortness of breath. 18 g 2   apixaban (ELIQUIS) 5 MG TABS tablet Take 1 tablet (98m) by mouth twice daily. 180 tablet 1   cetirizine (ZYRTEC) 5 MG tablet Take 5 mg by mouth daily.     cholecalciferol (VITAMIN D3) 25 MCG (1000 UT) tablet Take 2,000 Units by mouth daily.      Cyanocobalamin (VITAMIN B 12 PO) Take 5,000 mcg by mouth daily.      Echinacea 125 MG CAPS Take 125 mg by mouth daily.      Liraglutide -Weight Management (SAXENDA) 18 MG/3ML SOPN Inject 3 mg into the skin daily. 15 mL 0   lisinopril-hydrochlorothiazide (ZESTORETIC) 20-12.5 MG tablet TAKE 1 TABLET BY MOUTH ONCE A DAY 90 tablet 1   Magnesium 250 MG TABS Take 250 mg by mouth daily.      metroNIDAZOLE (METROCREAM) 0.75 % cream Apply topically 2 (two) times daily. 45 g 1   montelukast (SINGULAIR) 10 MG tablet Take 1 tablet (10 mg total) by mouth at bedtime. 90 tablet 1   Multiple Vitamin (MULTIVITAMIN) capsule Take 1 capsule by mouth daily.     Oxymetazoline HCl (NASAL SPRAY NA) Place 2 sprays into the nose as needed (CONGESTION).      potassium chloride SA (KLOR-CON M) 20 MEQ tablet Take 1 tablet (20 mEq total) by mouth daily. Please schedule appt for future refills. 1st attempt 90 tablet  3   Insulin Pen Needle (UNIFINE PENTIPS) 32G X 4 MM MISC USE WITH SAXENDA TO INJECT INTO SKIN DAILY 100 each 0     Scheduled Meds:  acetaZOLAMIDE  250 mg Oral BID   apixaban  5 mg Oral BID   insulin aspart  0-5 Units Subcutaneous QHS   insulin aspart  0-9 Units Subcutaneous TID WC   montelukast  10 mg Oral QHS   multivitamin with minerals  1 tablet Oral Daily   potassium chloride  40 mEq Oral BID   senna-docusate  1 tablet Oral QHS   Continuous Infusions:  sodium chloride 75 mL/hr at 10/13/22 0822   PRN Meds:.acetaminophen, hydrALAZINE, HYDROmorphone (DILAUDID) injection, melatonin, oxyCODONE, polyethylene glycol, prochlorperazine  Allergies:   Allergies  Allergen Reactions   Codeine Other (See Comments)    "whole body turned bright red, swelled a little" with V codiene   Lyrica [Pregabalin] Other (See Comments)    "suicidal thoughts"   Nyquil Multi-Symptom [Pseudoeph-Doxylamine-Dm-Apap] Other (See Comments)    Turns skin red *all over body*, pt reports getting very hot    Sulfa Antibiotics Other (See Comments)    "body turned red"    Family History  Problem Relation Age of Onset   Cataracts Mother    Diabetes Mother    Hypertension Mother    High Cholesterol Mother    Obesity Mother    Diabetes Father    Hypertension Father    Obesity Father    Atrial fibrillation Father    Thyroid cancer Maternal Great-grandmother    Lung cancer Paternal Grandmother     Social History:  reports that she has never smoked. She has never used smokeless tobacco. She reports that she does not drink alcohol and does not use drugs.  ROS: A complete review of systems was performed.  All systems are negative except for pertinent findings as noted.  Physical Exam:  Vital signs in last 24 hours: Temp:  [97.5 F (36.4 C)-98 F (36.7 C)] 98 F (36.7 C) (02/10 0510) Pulse Rate:  [77-102] 77 (02/10 0510) Resp:  [13-18] 18 (02/10 0510) BP: (94-187)/(56-96) 94/57 (02/10 0510) SpO2:  [94 %-100 %] 100 % (02/10 0510) Constitutional:  Alert and oriented, No acute distress  GI: Abdomen is soft, nontender, nondistended, no abdominal masses GU: No CVA tenderness Neurologic: Grossly intact, no focal deficits Psychiatric: Normal mood and affect  Laboratory Data:  Recent Labs    10/12/22 1634 10/13/22 0009  WBC 12.3* 11.2*  HGB 14.3 13.4  HCT 45.2 41.4  PLT 203 193    Recent Labs    10/12/22 1634 10/13/22 0009  NA 137 136  K 3.1* 3.5  CL 110 111  GLUCOSE 116* 151*  BUN 13 12  CALCIUM 10.1 9.3  CREATININE 1.05* 1.29*     Results for orders placed or performed during the hospital encounter of 10/12/22 (from the past 24  hour(s))  CBC with Differential     Status: Abnormal   Collection Time: 10/12/22  4:34 PM  Result Value Ref Range   WBC 12.3 (H) 4.0 - 10.5 K/uL   RBC 4.73 3.87 - 5.11 MIL/uL   Hemoglobin 14.3 12.0 - 15.0 g/dL   HCT 45.2 36.0 - 46.0 %   MCV 95.6 80.0 - 100.0 fL   MCH 30.2 26.0 - 34.0 pg   MCHC 31.6 30.0 - 36.0 g/dL   RDW 13.6 11.5 - 15.5 %   Platelets 203 150 - 400 K/uL   nRBC 0.0 0.0 -  0.2 %   Neutrophils Relative % 83 %   Neutro Abs 10.2 (H) 1.7 - 7.7 K/uL   Lymphocytes Relative 11 %   Lymphs Abs 1.3 0.7 - 4.0 K/uL   Monocytes Relative 5 %   Monocytes Absolute 0.7 0.1 - 1.0 K/uL   Eosinophils Relative 1 %   Eosinophils Absolute 0.1 0.0 - 0.5 K/uL   Basophils Relative 0 %   Basophils Absolute 0.0 0.0 - 0.1 K/uL   Immature Granulocytes 0 %   Abs Immature Granulocytes 0.03 0.00 - 0.07 K/uL  Comprehensive metabolic panel     Status: Abnormal   Collection Time: 10/12/22  4:34 PM  Result Value Ref Range   Sodium 137 135 - 145 mmol/L   Potassium 3.1 (L) 3.5 - 5.1 mmol/L   Chloride 110 98 - 111 mmol/L   CO2 15 (L) 22 - 32 mmol/L   Glucose, Bld 116 (H) 70 - 99 mg/dL   BUN 13 6 - 20 mg/dL   Creatinine, Ser 1.05 (H) 0.44 - 1.00 mg/dL   Calcium 10.1 8.9 - 10.3 mg/dL   Total Protein 7.6 6.5 - 8.1 g/dL   Albumin 4.0 3.5 - 5.0 g/dL   AST 41 15 - 41 U/L   ALT 48 (H) 0 - 44 U/L   Alkaline Phosphatase 42 38 - 126 U/L   Total Bilirubin 0.8 0.3 - 1.2 mg/dL   GFR, Estimated >60 >60 mL/min   Anion gap 12 5 - 15  Hemoglobin A1c     Status: None   Collection Time: 10/12/22  4:34 PM  Result Value Ref Range   Hgb A1c MFr Bld 5.1 4.8 - 5.6 %   Mean Plasma Glucose 99.67 mg/dL  Magnesium     Status: None   Collection Time: 10/12/22  4:34 PM  Result Value Ref Range   Magnesium 1.9 1.7 - 2.4 mg/dL  Urinalysis, Routine w reflex microscopic -Urine, Clean Catch     Status: Abnormal   Collection Time: 10/12/22  4:51 PM  Result Value Ref Range   Color, Urine YELLOW YELLOW   APPearance HAZY  (A) CLEAR   Specific Gravity, Urine 1.019 1.005 - 1.030   pH 6.0 5.0 - 8.0   Glucose, UA NEGATIVE NEGATIVE mg/dL   Hgb urine dipstick MODERATE (A) NEGATIVE   Bilirubin Urine NEGATIVE NEGATIVE   Ketones, ur NEGATIVE NEGATIVE mg/dL   Protein, ur NEGATIVE NEGATIVE mg/dL   Nitrite NEGATIVE NEGATIVE   Leukocytes,Ua NEGATIVE NEGATIVE   RBC / HPF >50 0 - 5 RBC/hpf   WBC, UA 0-5 0 - 5 WBC/hpf   Bacteria, UA NONE SEEN NONE SEEN   Squamous Epithelial / HPF 0-5 0 - 5 /HPF   Mucus PRESENT   Lactic acid, plasma     Status: None   Collection Time: 10/12/22  4:54 PM  Result Value Ref Range   Lactic Acid, Venous 1.6 0.5 - 1.9 mmol/L  Glucose, capillary     Status: Abnormal   Collection Time: 10/12/22 10:04 PM  Result Value Ref Range   Glucose-Capillary 134 (H) 70 - 99 mg/dL  CBC     Status: Abnormal   Collection Time: 10/13/22 12:09 AM  Result Value Ref Range   WBC 11.2 (H) 4.0 - 10.5 K/uL   RBC 4.46 3.87 - 5.11 MIL/uL   Hemoglobin 13.4 12.0 - 15.0 g/dL   HCT 41.4 36.0 - 46.0 %   MCV 92.8 80.0 - 100.0 fL   MCH 30.0 26.0 - 34.0  pg   MCHC 32.4 30.0 - 36.0 g/dL   RDW 13.6 11.5 - 15.5 %   Platelets 193 150 - 400 K/uL   nRBC 0.0 0.0 - 0.2 %  Basic metabolic panel     Status: Abnormal   Collection Time: 10/13/22 12:09 AM  Result Value Ref Range   Sodium 136 135 - 145 mmol/L   Potassium 3.5 3.5 - 5.1 mmol/L   Chloride 111 98 - 111 mmol/L   CO2 16 (L) 22 - 32 mmol/L   Glucose, Bld 151 (H) 70 - 99 mg/dL   BUN 12 6 - 20 mg/dL   Creatinine, Ser 1.29 (H) 0.44 - 1.00 mg/dL   Calcium 9.3 8.9 - 10.3 mg/dL   GFR, Estimated 51 (L) >60 mL/min   Anion gap 9 5 - 15  Magnesium     Status: None   Collection Time: 10/13/22 12:09 AM  Result Value Ref Range   Magnesium 2.0 1.7 - 2.4 mg/dL  Phosphorus     Status: None   Collection Time: 10/13/22 12:09 AM  Result Value Ref Range   Phosphorus 4.4 2.5 - 4.6 mg/dL  Glucose, capillary     Status: Abnormal   Collection Time: 10/13/22  7:52 AM  Result  Value Ref Range   Glucose-Capillary 106 (H) 70 - 99 mg/dL   No results found for this or any previous visit (from the past 240 hour(s)).  Renal Function: Recent Labs    10/12/22 1634 10/13/22 0009  CREATININE 1.05* 1.29*   Estimated Creatinine Clearance: 68.7 mL/min (A) (by C-G formula based on SCr of 1.29 mg/dL (H)).  Radiologic Imaging: CT ABDOMEN PELVIS W CONTRAST  Result Date: 10/12/2022 CLINICAL DATA:  Left lower quadrant pain EXAM: CT ABDOMEN AND PELVIS WITH CONTRAST TECHNIQUE: Multidetector CT imaging of the abdomen and pelvis was performed using the standard protocol following bolus administration of intravenous contrast. RADIATION DOSE REDUCTION: This exam was performed according to the departmental dose-optimization program which includes automated exposure control, adjustment of the mA and/or kV according to patient size and/or use of iterative reconstruction technique. CONTRAST:  13m OMNIPAQUE IOHEXOL 300 MG/ML  SOLN COMPARISON:  CT 03/04/2021 FINDINGS: Lower chest: No acute abnormality. Hepatobiliary: Hepatic steatosis. No calcified gallstone or biliary dilatation Pancreas: Unremarkable. No pancreatic ductal dilatation or surrounding inflammatory changes. Spleen: Normal in size without focal abnormality. Adrenals/Urinary Tract: Adrenal glands are within normal limits. Intrarenal stone lower pole left kidney measuring 9 mm. Mild left perinephric fat stranding and mild left hydronephrosis and hydroureter, secondary to a 5 mm stone at the left UVJ. Stomach/Bowel: Stomach nonenlarged. No dilated small bowel. No acute bowel wall thickening. Postsurgical changes at the cecum consistent with previous appendectomy. Vascular/Lymphatic: No significant vascular findings are present. No enlarged abdominal or pelvic lymph nodes. Reproductive: Uterus and bilateral adnexa are unremarkable. Other: Negative for pelvic effusion or free air. Small fat containing umbilical hernia Musculoskeletal: No acute  or significant osseous findings. IMPRESSION: 1. Mild left perinephric fat stranding and mild left hydronephrosis and hydroureter, secondary to a 5 mm stone at the left UVJ. Hepatic steatosis. 2. 9 mm intrarenal stone on the left 3. Hepatic steatosis Electronically Signed   By: KDonavan FoilM.D.   On: 10/12/2022 18:02    I independently reviewed the above imaging studies.  Impression/Recommendation: 1.  Left UVJ stone now recently passed with minimal discomfort Plan/recommendation.  Will outpatient have breakfast this morning assuming she able to tolerate p.o. is okay to discharge home.  Will see  back as outpatient as needed for follow-up in urology.  Remi Haggard 10/13/2022, 8:43 AM     CC:

## 2022-10-13 NOTE — Progress Notes (Signed)
Pt discharged home with spouse in stable condition. Discharge instructions given. No immediate questions or concerns at this time. Discharged from unit via wheelchair.

## 2022-10-13 NOTE — Progress Notes (Signed)
Patient passed stone, kept at a container in bathroom.

## 2022-10-15 ENCOUNTER — Other Ambulatory Visit (HOSPITAL_COMMUNITY): Payer: Self-pay

## 2022-10-15 ENCOUNTER — Encounter: Payer: Self-pay | Admitting: Family

## 2022-10-15 ENCOUNTER — Ambulatory Visit: Payer: Commercial Managed Care - PPO | Admitting: Family

## 2022-10-15 ENCOUNTER — Telehealth: Payer: Self-pay

## 2022-10-15 VITALS — BP 113/73 | HR 87 | Temp 98.7°F | Ht 65.0 in | Wt 274.0 lb

## 2022-10-15 DIAGNOSIS — G932 Benign intracranial hypertension: Secondary | ICD-10-CM | POA: Diagnosis not present

## 2022-10-15 DIAGNOSIS — E7849 Other hyperlipidemia: Secondary | ICD-10-CM

## 2022-10-15 DIAGNOSIS — L719 Rosacea, unspecified: Secondary | ICD-10-CM | POA: Diagnosis not present

## 2022-10-15 DIAGNOSIS — J4520 Mild intermittent asthma, uncomplicated: Secondary | ICD-10-CM | POA: Diagnosis not present

## 2022-10-15 DIAGNOSIS — Z86711 Personal history of pulmonary embolism: Secondary | ICD-10-CM | POA: Diagnosis not present

## 2022-10-15 DIAGNOSIS — Z0001 Encounter for general adult medical examination with abnormal findings: Secondary | ICD-10-CM | POA: Diagnosis not present

## 2022-10-15 DIAGNOSIS — Z6841 Body Mass Index (BMI) 40.0 and over, adult: Secondary | ICD-10-CM

## 2022-10-15 DIAGNOSIS — Z0000 Encounter for general adult medical examination without abnormal findings: Secondary | ICD-10-CM | POA: Diagnosis not present

## 2022-10-15 DIAGNOSIS — E559 Vitamin D deficiency, unspecified: Secondary | ICD-10-CM

## 2022-10-15 DIAGNOSIS — I10 Essential (primary) hypertension: Secondary | ICD-10-CM

## 2022-10-15 DIAGNOSIS — J301 Allergic rhinitis due to pollen: Secondary | ICD-10-CM | POA: Diagnosis not present

## 2022-10-15 MED ORDER — ALBUTEROL SULFATE HFA 108 (90 BASE) MCG/ACT IN AERS
2.0000 | INHALATION_SPRAY | RESPIRATORY_TRACT | 2 refills | Status: DC | PRN
  Filled 2022-10-15: qty 6.7, 25d supply, fill #0
  Filled 2023-06-11: qty 6.7, 17d supply, fill #1

## 2022-10-15 NOTE — Patient Instructions (Signed)

## 2022-10-15 NOTE — Progress Notes (Signed)
Subjective:    Patient ID: [name], female    DOB: 25-Jan-1971, 52 y.o.   MRN: [id number]  Chief Complaint  Patient presents with   Medical Management of Chronic Issues    Kidney Joaquim Lai this weekend was admi   Pt presents to the office today for CPE and chronic follow up. Pt has idiopathic intracranial hypertension and is followed by Dr. Leta Baptist, neurologists annually.   She is followed by Weight management  for morbid obese with a BMI of 44. She states she had lost 17 lbs, but she has not been there in several months. States she has gained most of that weight back.      10/15/2022   10:50 AM 10/11/2022    8:51 AM 09/27/2022    9:51 AM  Last 3 Weights  Weight (lbs) 274 lb 266 lb 266 lb  Weight (kg) 124.286 kg 120.657 kg 120.657 kg       She has rosacea and uses metronidazole 0.75% as needed.    She has hx of PE and taking Eliquis. Has intermittent SOB and followed by Hematologist.   She had ureteral stone left. She went to the ED 10/13/22. Hypertension This is a chronic problem. The current episode started more than 1 year ago. The problem has been resolved since onset. The problem is controlled. Pertinent negatives include no malaise/fatigue, peripheral edema or shortness of breath. Risk factors for coronary artery disease include dyslipidemia, obesity and sedentary lifestyle. The current treatment provides moderate improvement.  Asthma She complains of cough and wheezing. There is no shortness of breath. This is a chronic problem. The problem occurs intermittently. Pertinent negatives include no malaise/fatigue. Her symptoms are aggravated by pollen. She reports moderate improvement on treatment. Her past medical history is significant for asthma.  Hyperlipidemia This is a chronic problem. The current episode started more than 1 year ago. The problem is controlled. Exacerbating diseases include obesity. Pertinent negatives include no shortness of breath. Current  antihyperlipidemic treatment includes diet change. The current treatment provides mild improvement of lipids. Risk factors for coronary artery disease include dyslipidemia, hypertension and a sedentary lifestyle.      Review of Systems  Constitutional:  Negative for malaise/fatigue.  Respiratory:  Positive for cough and wheezing. Negative for shortness of breath.   All other systems reviewed and are negative.   Family History  Problem Relation Age of Onset   Cataracts Mother    Diabetes Mother    Hypertension Mother    High Cholesterol Mother    Obesity Mother    Diabetes Father    Hypertension Father    Obesity Father    Atrial fibrillation Father    Thyroid cancer Maternal Great-grandmother    Lung cancer Paternal Grandmother    Social History   Socioeconomic History   Marital status: Married    Spouse name: Johnny   Number of children: 0   Years of education: 14   Highest education level: Not on file  Occupational History   Occupation: CT Engineer, production: [employer]   Occupation: ct tech  Tobacco Use   Smoking status: Never   Smokeless tobacco: Never  Vaping Use   Vaping Use: Never used  Substance and Sexual Activity   Alcohol use: No    Alcohol/week: 0.0 standard drinks of alcohol   Drug use: No   Sexual activity: Not on file  Other Topics Concern   Not on file  Social History Narrative  Married, no children, lives at home   Caffeine use- 2-3 cups coffee 5 days a week   Social Determinants of Radio broadcast assistant Strain: Not on file  Food Insecurity: Not on file  Transportation Needs: Not on file  Physical Activity: Not on file  Stress: Not on file  Social Connections: Not on file       Objective:   Physical Exam Vitals reviewed.  Constitutional:      General: She is not in acute distress.    Appearance: She is well-developed. She is obese.  HENT:     Head: Normocephalic and atraumatic.     Right Ear: Tympanic membrane  normal.     Left Ear: Tympanic membrane normal.  Eyes:     Pupils: Pupils are equal, round, and reactive to light.  Neck:     Thyroid: No thyromegaly.  Cardiovascular:     Rate and Rhythm: Normal rate and regular rhythm.     Heart sounds: Normal heart sounds. No murmur heard. Pulmonary:     Effort: Pulmonary effort is normal. No respiratory distress.     Breath sounds: Normal breath sounds. No wheezing.  Abdominal:     General: Bowel sounds are normal. There is no distension.     Palpations: Abdomen is soft.     Tenderness: There is no abdominal tenderness.  Musculoskeletal:        General: No tenderness. Normal range of motion.     Cervical back: Normal range of motion and neck supple.  Skin:    General: Skin is warm and dry.  Neurological:     Mental Status: She is alert and oriented to person, place, and time.     Cranial Nerves: No cranial nerve deficit.     Deep Tendon Reflexes: Reflexes are normal and symmetric.  Psychiatric:        Behavior: Behavior normal.        Thought Content: Thought content normal.        Judgment: Judgment normal.       BP 113/73   Pulse 87   Temp 98.7 F (37.1 C) (Temporal)   Ht '5\' 5"'$  (1.651 m)   Wt 274 lb (124.3 kg)   LMP 09/18/2022   SpO2 94%   BMI 45.60 kg/m      Assessment & Plan:  HETHER ANSELMO comes in today with chief complaint of Medical Management of Chronic Issues (Kidney Stone this weekend was admi)   Diagnosis and orders addressed:  1. Allergic rhinitis due to pollen, unspecified seasonality - albuterol (VENTOLIN HFA) 108 (90 Base) MCG/ACT inhaler; Inhale 2 puffs into the lungs every 4 hours as needed for wheezing or shortness of breath.  Dispense: 18 g; Refill: 2 - CMP14+EGFR - CBC with Differential/Platelet  2. Mild intermittent asthma without complication - ZOX09+UEAV - CBC with Differential/Platelet  3. Essential hypertension - CMP14+EGFR - CBC with Differential/Platelet  4. History of pulmonary  embolus (PE) - CMP14+EGFR - CBC with Differential/Platelet  5. Idiopathic intracranial hypertension - CMP14+EGFR - CBC with Differential/Platelet  6. Intracranial hypertension - CMP14+EGFR - CBC with Differential/Platelet  7. Morbid obesity ([place]) - CMP14+EGFR - CBC with Differential/Platelet  8. Other hyperlipidemia - CMP14+EGFR - CBC with Differential/Platelet - Lipid panel  9. Rosacea - CMP14+EGFR - CBC with Differential/Platelet  10. Vitamin D deficiency - CMP14+EGFR - CBC with Differential/Platelet - VITAMIN D 25 Hydroxy (Vit-D Deficiency, Fractures)  11. Annual physical exam - CMP14+EGFR - CBC with Differential/Platelet - Lipid  panel - TSH - VITAMIN D 25 Hydroxy (Vit-D Deficiency, Fractures)   Labs pending Health Maintenance reviewed Diet and exercise encouraged  Follow up plan: 6 months    [name], FNP

## 2022-10-15 NOTE — Telephone Encounter (Signed)
Ok, thank you

## 2022-10-15 NOTE — Transitions of Care (Post Inpatient/ED Visit) (Signed)
   10/15/2022  Name: [name] MRN: [id number] DOB: 1970/12/09  Today's TOC FU Call Status: Today's TOC FU Call Status:: Unsuccessul Call (1st Attempt) Unsuccessful Call (1st Attempt) Date: 10/15/22  Attempted to reach the patient regarding the most recent Inpatient/ED visit. Patient has already been seen in [place]  Follow Up Plan: No further outreach attempts will be made at this time. We have been unable to contact the patient.  Signature Juanda Crumble, [place] Direct Dial [phone]

## 2022-10-16 LAB — CBC WITH DIFFERENTIAL/PLATELET
Basophils Absolute: 0 10*3/uL (ref 0.0–0.2)
Basos: 1 %
EOS (ABSOLUTE): 0.2 10*3/uL (ref 0.0–0.4)
Eos: 3 %
Hematocrit: 41.5 % (ref 34.0–46.6)
Hemoglobin: 13.7 g/dL (ref 11.1–15.9)
Immature Grans (Abs): 0 10*3/uL (ref 0.0–0.1)
Immature Granulocytes: 0 %
Lymphocytes Absolute: 1.7 10*3/uL (ref 0.7–3.1)
Lymphs: 26 %
MCH: 30.3 pg (ref 26.6–33.0)
MCHC: 33 g/dL (ref 31.5–35.7)
MCV: 92 fL (ref 79–97)
Monocytes Absolute: 0.4 10*3/uL (ref 0.1–0.9)
Monocytes: 6 %
Neutrophils Absolute: 4.1 10*3/uL (ref 1.4–7.0)
Neutrophils: 64 %
Platelets: 219 10*3/uL (ref 150–450)
RBC: 4.52 x10E6/uL (ref 3.77–5.28)
RDW: 13.3 % (ref 11.7–15.4)
WBC: 6.4 10*3/uL (ref 3.4–10.8)

## 2022-10-16 LAB — CMP14+EGFR
ALT: 40 IU/L — ABNORMAL HIGH (ref 0–32)
AST: 19 IU/L (ref 0–40)
Albumin/Globulin Ratio: 1.5 (ref 1.2–2.2)
Albumin: 4.1 g/dL (ref 3.9–4.9)
Alkaline Phosphatase: 49 IU/L (ref 44–121)
BUN/Creatinine Ratio: 18 (ref 9–23)
BUN: 16 mg/dL (ref 6–24)
Bilirubin Total: 0.2 mg/dL (ref 0.0–1.2)
CO2: 18 mmol/L — ABNORMAL LOW (ref 20–29)
Calcium: 10.4 mg/dL — ABNORMAL HIGH (ref 8.7–10.2)
Chloride: 104 mmol/L (ref 96–106)
Creatinine, Ser: 0.89 mg/dL (ref 0.57–1.00)
Globulin, Total: 2.8 g/dL (ref 1.5–4.5)
Glucose: 87 mg/dL (ref 70–99)
Potassium: 4 mmol/L (ref 3.5–5.2)
Sodium: 137 mmol/L (ref 134–144)
Total Protein: 6.9 g/dL (ref 6.0–8.5)
eGFR: 79 mL/min/{1.73_m2} (ref >59)

## 2022-10-16 LAB — LIPID PANEL
Chol/HDL Ratio: 3 ratio (ref 0.0–4.4)
Cholesterol, Total: 167 mg/dL (ref 100–199)
HDL: 55 mg/dL (ref >39)
LDL Chol Calc (NIH): 95 mg/dL (ref 0–99)
Triglycerides: 92 mg/dL (ref 0–149)
VLDL Cholesterol Cal: 17 mg/dL (ref 5–40)

## 2022-10-16 LAB — TSH: TSH: 1.54 u[IU]/mL (ref 0.450–4.500)

## 2022-10-16 LAB — VITAMIN D 25 HYDROXY (VIT D DEFICIENCY, FRACTURES): Vit D, 25-Hydroxy: 67.4 ng/mL (ref 30.0–100.0)

## 2022-10-17 ENCOUNTER — Encounter: Payer: Self-pay | Admitting: Internal Medicine

## 2022-10-30 ENCOUNTER — Telehealth: Payer: Self-pay | Admitting: Hematology and Oncology

## 2022-10-30 NOTE — Telephone Encounter (Signed)
Called patient per provider PAL to reschedule 5/15 appointments. Patient rescheduled and notified.

## 2022-11-02 ENCOUNTER — Other Ambulatory Visit: Payer: Self-pay

## 2022-11-02 ENCOUNTER — Other Ambulatory Visit (HOSPITAL_COMMUNITY): Payer: Self-pay

## 2022-11-02 ENCOUNTER — Other Ambulatory Visit: Payer: Self-pay | Admitting: Family

## 2022-11-02 MED ORDER — APIXABAN 5 MG PO TABS
5.0000 mg | ORAL_TABLET | Freq: Two times a day (BID) | ORAL | 0 refills | Status: DC
  Filled 2022-11-02: qty 180, 90d supply, fill #0

## 2022-11-24 ENCOUNTER — Other Ambulatory Visit: Payer: Self-pay

## 2022-11-26 ENCOUNTER — Other Ambulatory Visit: Payer: Self-pay | Admitting: *Deleted

## 2022-11-26 ENCOUNTER — Other Ambulatory Visit (HOSPITAL_COMMUNITY): Payer: Self-pay

## 2022-11-26 MED ORDER — ACETAZOLAMIDE 250 MG PO TABS
250.0000 mg | ORAL_TABLET | Freq: Two times a day (BID) | ORAL | 0 refills | Status: DC
  Filled 2022-11-26: qty 180, 90d supply, fill #0

## 2022-11-26 NOTE — Telephone Encounter (Signed)
Last seen on 11/29/21 Follow up scheduled on 12/04/22
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
## 2022-12-03 NOTE — Patient Instructions (Signed)
Below is our plan:  We will continue acetazolamide 250mg  twice daily   Please make sure you are staying well hydrated. I recommend 50-60 ounces daily. Well balanced diet and regular exercise encouraged. Consistent sleep schedule with 6-8 hours recommended.   Please continue follow up with care team as directed.   Follow up with me in 1 year   You may receive a survey regarding today's visit. I encourage you to leave honest feed back as I do use this information to improve patient care. Thank you for seeing me today!

## 2022-12-04 ENCOUNTER — Other Ambulatory Visit (HOSPITAL_COMMUNITY): Payer: Self-pay

## 2022-12-04 ENCOUNTER — Ambulatory Visit: Payer: Commercial Managed Care - PPO | Admitting: Family Medicine

## 2022-12-04 ENCOUNTER — Encounter: Payer: Self-pay | Admitting: Family Medicine

## 2022-12-04 VITALS — BP 135/83 | HR 60 | Ht 65.0 in | Wt 279.8 lb

## 2022-12-04 DIAGNOSIS — G932 Benign intracranial hypertension: Secondary | ICD-10-CM | POA: Diagnosis not present

## 2022-12-04 MED ORDER — ACETAZOLAMIDE 250 MG PO TABS
250.0000 mg | ORAL_TABLET | Freq: Two times a day (BID) | ORAL | 3 refills | Status: DC
  Filled 2022-12-04 – 2023-02-24 (×2): qty 180, 90d supply, fill #0
  Filled 2023-05-21: qty 180, 90d supply, fill #1
  Filled 2023-08-22: qty 180, 90d supply, fill #2
  Filled 2023-11-20: qty 180, 90d supply, fill #3

## 2022-12-05 ENCOUNTER — Ambulatory Visit: Payer: 59 | Admitting: Family Medicine

## 2022-12-17 ENCOUNTER — Other Ambulatory Visit: Payer: Self-pay | Admitting: Family

## 2022-12-17 DIAGNOSIS — J4520 Mild intermittent asthma, uncomplicated: Secondary | ICD-10-CM

## 2022-12-17 DIAGNOSIS — J301 Allergic rhinitis due to pollen: Secondary | ICD-10-CM

## 2022-12-18 ENCOUNTER — Other Ambulatory Visit (HOSPITAL_COMMUNITY): Payer: Self-pay

## 2022-12-18 MED ORDER — MONTELUKAST SODIUM 10 MG PO TABS
10.0000 mg | ORAL_TABLET | Freq: Every day | ORAL | 1 refills | Status: DC
  Filled 2022-12-18: qty 90, 90d supply, fill #0
  Filled 2023-03-24: qty 90, 90d supply, fill #1

## 2022-12-21 ENCOUNTER — Emergency Department (HOSPITAL_COMMUNITY): Payer: Commercial Managed Care - PPO

## 2022-12-21 ENCOUNTER — Encounter (HOSPITAL_COMMUNITY): Payer: Self-pay | Admitting: *Deleted

## 2022-12-21 ENCOUNTER — Other Ambulatory Visit (HOSPITAL_COMMUNITY): Payer: Self-pay

## 2022-12-21 ENCOUNTER — Emergency Department (HOSPITAL_COMMUNITY)
Admission: EM | Admit: 2022-12-21 | Discharge: 2022-12-21 | Disposition: A | Discharge status: Home / Self Care | Payer: Commercial Managed Care - PPO | Attending: Emergency Medicine | Admitting: Emergency Medicine

## 2022-12-21 ENCOUNTER — Other Ambulatory Visit: Payer: Self-pay

## 2022-12-21 DIAGNOSIS — R319 Hematuria, unspecified: Secondary | ICD-10-CM | POA: Diagnosis present

## 2022-12-21 DIAGNOSIS — Z794 Long term (current) use of insulin: Secondary | ICD-10-CM | POA: Diagnosis not present

## 2022-12-21 DIAGNOSIS — N132 Hydronephrosis with renal and ureteral calculous obstruction: Secondary | ICD-10-CM | POA: Insufficient documentation

## 2022-12-21 DIAGNOSIS — I10 Essential (primary) hypertension: Secondary | ICD-10-CM | POA: Diagnosis not present

## 2022-12-21 DIAGNOSIS — K760 Fatty (change of) liver, not elsewhere classified: Secondary | ICD-10-CM | POA: Diagnosis not present

## 2022-12-21 DIAGNOSIS — Z79899 Other long term (current) drug therapy: Secondary | ICD-10-CM | POA: Diagnosis not present

## 2022-12-21 DIAGNOSIS — N200 Calculus of kidney: Secondary | ICD-10-CM

## 2022-12-21 DIAGNOSIS — Z7901 Long term (current) use of anticoagulants: Secondary | ICD-10-CM | POA: Insufficient documentation

## 2022-12-21 LAB — CBC WITH DIFFERENTIAL/PLATELET
Abs Immature Granulocytes: 0.04 10*3/uL (ref 0.00–0.07)
Basophils Absolute: 0 10*3/uL (ref 0.0–0.1)
Basophils Relative: 1 %
Eosinophils Absolute: 0.5 10*3/uL (ref 0.0–0.5)
Eosinophils Relative: 6 %
HCT: 41.2 % (ref 36.0–46.0)
Hemoglobin: 13.5 g/dL (ref 12.0–15.0)
Immature Granulocytes: 1 %
Lymphocytes Relative: 28 %
Lymphs Abs: 2.3 10*3/uL (ref 0.7–4.0)
MCH: 30.3 pg (ref 26.0–34.0)
MCHC: 32.8 g/dL (ref 30.0–36.0)
MCV: 92.4 fL (ref 80.0–100.0)
Monocytes Absolute: 0.6 10*3/uL (ref 0.1–1.0)
Monocytes Relative: 7 %
Neutro Abs: 4.8 10*3/uL (ref 1.7–7.7)
Neutrophils Relative %: 57 %
Platelets: 259 10*3/uL (ref 150–400)
RBC: 4.46 MIL/uL (ref 3.87–5.11)
RDW: 13.5 % (ref 11.5–15.5)
WBC: 8.2 10*3/uL (ref 4.0–10.5)
nRBC: 0 % (ref 0.0–0.2)

## 2022-12-21 LAB — BASIC METABOLIC PANEL
Anion gap: 11 (ref 5–15)
BUN: 17 mg/dL (ref 6–20)
CO2: 17 mmol/L — ABNORMAL LOW (ref 22–32)
Calcium: 8.8 mg/dL — ABNORMAL LOW (ref 8.9–10.3)
Chloride: 107 mmol/L (ref 98–111)
Creatinine, Ser: 0.75 mg/dL (ref 0.44–1.00)
GFR, Estimated: >60 mL/min (ref >60)
Glucose, Bld: 103 mg/dL — ABNORMAL HIGH (ref 70–99)
Potassium: 4.1 mmol/L (ref 3.5–5.1)
Sodium: 135 mmol/L (ref 135–145)

## 2022-12-21 LAB — URINALYSIS, ROUTINE W REFLEX MICROSCOPIC
Bilirubin Urine: NEGATIVE
Glucose, UA: NEGATIVE mg/dL
Ketones, ur: NEGATIVE mg/dL
Leukocytes,Ua: NEGATIVE
Nitrite: NEGATIVE
Protein, ur: 100 mg/dL — AB
RBC / HPF: >50 RBC/hpf (ref 0–5)
Specific Gravity, Urine: 1.013 (ref 1.005–1.030)
pH: 5 (ref 5.0–8.0)

## 2022-12-21 MED ORDER — TAMSULOSIN HCL 0.4 MG PO CAPS
0.4000 mg | ORAL_CAPSULE | Freq: Every day | ORAL | 0 refills | Status: AC
  Filled 2022-12-21: qty 30, 30d supply, fill #0

## 2022-12-21 MED ORDER — OXYCODONE HCL 5 MG PO TABS
5.0000 mg | ORAL_TABLET | ORAL | 0 refills | Status: DC | PRN
  Filled 2022-12-21: qty 30, 5d supply, fill #0

## 2022-12-21 NOTE — ED Provider Notes (Signed)
[facility] EMERGENCY DEPARTMENT AT [facility] Provider Note   CSN: [id number] Arrival date & time: 12/21/22  4098     History  Chief Complaint  Patient presents with   Flank Pain   Hematuria    [name] is a 52 y.o. female.  Patient presents to the emergency department complaining of left-sided flank pain.  Patient states that pain began yesterday afternoon at approximately 1 PM.  Patient with known 9 mm stone in left kidney.  Patient currently endorsing left-sided flank pain, hematuria, nausea without emesis.  Patient does endorse taking Zofran yesterday afternoon which helped relieve some of the nausea.  Pain currently moderate in severity.  Patient with history of 5 mm stone which passed the morning of planned urologic intervention on February 9 of this year.  Past medical history significant for kidney stones, HTN, pseudotumor cerebri, hypertension, Chiari I malformation, PE history on Eliquis  HPI     Home Medications Prior to Admission medications   Medication Sig Start Date End Date Taking? Authorizing Provider  oxyCODONE (ROXICODONE) 5 MG immediate release tablet Take 1 tablet (5 mg total) by mouth every 4 (four) hours as needed for severe pain. 12/21/22  Yes Darrick Grinder, PA-C  tamsulosin (FLOMAX) 0.4 MG CAPS capsule Take 1 capsule (0.4 mg total) by mouth daily. 12/21/22 01/20/23 Yes Darrick Grinder, PA-C  acetaZOLAMIDE (DIAMOX) 250 MG tablet Take 1 tablet (250 mg total) by mouth 2 (two) times daily. 12/04/22   Lomax, Amy, NP  albuterol (VENTOLIN HFA) 108 (90 Base) MCG/ACT inhaler Inhale 2 puffs into the lungs every 4 hours as needed for wheezing or shortness of breath. 10/15/22   Junie Spencer, FNP  apixaban (ELIQUIS) 5 MG TABS tablet Take 1 tablet (5mg ) by mouth twice daily. 11/02/22   Junie Spencer, FNP  cetirizine (ZYRTEC) 5 MG tablet Take 5 mg by mouth daily.    [provider]  cholecalciferol (VITAMIN D3) 25 MCG (1000 UT) tablet Take  2,000 Units by mouth daily.     [provider]  Cyanocobalamin (VITAMIN B 12 PO) Take 5,000 mcg by mouth daily.     [provider]  Echinacea 125 MG CAPS Take 125 mg by mouth daily.     [provider]  Insulin Pen Needle (UNIFINE PENTIPS) 32G X 4 MM MISC USE WITH SAXENDA TO INJECT INTO SKIN DAILY Patient not taking: Reported on 12/04/2022 03/02/21   William Hamburger D, NP  Liraglutide -Weight Management (SAXENDA) 18 MG/3ML SOPN Inject 3 mg into the skin daily. Patient not taking: Reported on 12/04/2022 04/16/22   Irene Limbo, FNP  lisinopril-hydrochlorothiazide (ZESTORETIC) 20-12.5 MG tablet TAKE 1 TABLET BY MOUTH ONCE A DAY 09/19/22 09/19/23  Jannifer Rodney A, FNP  Magnesium 250 MG TABS Take 250 mg by mouth daily.     [provider]  metroNIDAZOLE (METROCREAM) 0.75 % cream Apply topically 2 (two) times daily. 09/18/21   Jannifer Rodney A, FNP  montelukast (SINGULAIR) 10 MG tablet Take 1 tablet (10 mg total) by mouth at bedtime. 12/18/22   Junie Spencer, FNP  Multiple Vitamin (MULTIVITAMIN) capsule Take 1 capsule by mouth daily.    [provider]  Oxymetazoline HCl (NASAL SPRAY NA) Place 2 sprays into the nose as needed (CONGESTION).     [provider]  potassium chloride SA (KLOR-CON M) 20 MEQ tablet Take 1 tablet (20 mEq total) by mouth daily. Please schedule appt for future refills. 1st attempt 07/31/22  Rollene Rotunda, MD      Allergies    Codeine, Lyrica [pregabalin], Nyquil multi-symptom [pseudoeph-doxylamine-dm-apap], and Sulfa antibiotics    Review of Systems   Review of Systems  Physical Exam Updated Vital Signs BP (!) 155/119 (BP Location: Left Arm)   Pulse 83   Temp 98.1 F (36.7 C) (Oral)   Resp 16   Ht  (1.651 m)   Wt 122.5 kg   LMP 12/14/2022   SpO2 100%   BMI 44.93 kg/m  Physical Exam Vitals and nursing note reviewed.  Constitutional:      General: She is not in acute distress.    Appearance: She  is well-developed.  HENT:     Head: Normocephalic and atraumatic.  Eyes:     Conjunctiva/sclera: Conjunctivae normal.  Cardiovascular:     Rate and Rhythm: Normal rate and regular rhythm.     Heart sounds: No murmur heard. Pulmonary:     Effort: Pulmonary effort is normal. No respiratory distress.     Breath sounds: Normal breath sounds.  Abdominal:     Palpations: Abdomen is soft.     Tenderness: There is no abdominal tenderness. There is left CVA tenderness.  Musculoskeletal:        General: No swelling.     Cervical back: Neck supple.  Skin:    General: Skin is warm and dry.     Capillary Refill: Capillary refill takes less than 2 seconds.  Neurological:     Mental Status: She is alert.  Psychiatric:        Mood and Affect: Mood normal.     ED Results / Procedures / Treatments   Labs (all labs ordered are listed, but only abnormal results are displayed) Labs Reviewed  BASIC METABOLIC PANEL - Abnormal; Notable for the following components:      Result Value   CO2 17 (*)    Glucose, Bld 103 (*)    Calcium 8.8 (*)    All other components within normal limits  URINALYSIS, ROUTINE W REFLEX MICROSCOPIC - Abnormal; Notable for the following components:   Color, Urine AMBER (*)    APPearance CLOUDY (*)    Hgb urine dipstick LARGE (*)    Protein, ur 100 (*)    Bacteria, UA RARE (*)    All other components within normal limits  CBC WITH DIFFERENTIAL/PLATELET    EKG None  Radiology CT Renal Stone Study  Result Date: 12/21/2022 CLINICAL DATA:  Left flank pain and hematuria. EXAM: CT ABDOMEN AND PELVIS WITHOUT CONTRAST TECHNIQUE: Multidetector CT imaging of the abdomen and pelvis was performed following the standard protocol without IV contrast. RADIATION DOSE REDUCTION: This exam was performed according to the departmental dose-optimization program which includes automated exposure control, adjustment of the mA and/or kV according to patient size and/or use of iterative  reconstruction technique. COMPARISON:  CT abdomen and pelvis 10/12/2022 FINDINGS: Lower chest: Clear lung bases. Hepatobiliary: Hepatic steatosis. Unremarkable gallbladder. No biliary dilatation. Pancreas: Unremarkable. Spleen: Unremarkable. Adrenals/Urinary Tract: Unremarkable adrenal glands and right kidney. 9 mm stone in the proximal left ureter, likely reflecting interval migration of the lower pole renal stone on the prior study. Mild left hydronephrosis. Interval passage of the left UVJ stone on the prior study. Unremarkable bladder. Stomach/Bowel: The stomach is unremarkable. Mild left-sided colonic diverticulosis is noted. There is no evidence of acute bowel inflammation or bowel obstruction. Prior appendectomy. Vascular/Lymphatic: Minor abdominal aortic atherosclerosis without aneurysm. No enlarged lymph nodes. Reproductive: Uterus and bilateral adnexa are  unremarkable. Other: No ascites or pneumoperitoneum. Musculoskeletal: No acute osseous abnormality or suspicious osseous lesion. IMPRESSION: 1. 9 mm proximal left ureteral stone with mild hydronephrosis. 2. Hepatic steatosis. 3.  Aortic Atherosclerosis (ICD10-I70.0). Electronically Signed   By: Sebastian Ache M.D.   On: 12/21/2022 08:30    Procedures Procedures    Medications Ordered in ED Medications - No data to display  ED Course/ Medical Decision Making/ A&P                             Medical Decision Making Amount and/or Complexity of Data Reviewed Radiology: ordered.   This patient presents to the ED for concern of left-sided flank pain, hematuria, nausea, this involves an extensive number of treatment options, and is a complaint that carries with it a high risk of complications and morbidity.  The differential diagnosis includes nephrolithiasis, pyelonephritis, hydronephrosis, and others   Co morbidities that complicate the patient evaluation  Chronic anticoagulation, nephrolithiasis hx   Additional history  obtained:   External records from outside source obtained and reviewed including discharge summary and related notes from February of this year, patient admitted for pain control due to nephrolithiasis with planned urologic intervention.  Patient spontaneously passed stone   Lab Tests:  I Ordered, and personally interpreted labs.  The pertinent results include: UA with hematuria, normal creatinine, unremarkable CBC   Imaging Studies ordered:  I ordered imaging studies including CT renal stone study I independently visualized and interpreted imaging which showed 9 mm proximal left ureteral stone with mild hydronephrosis  I agree with the radiologist interpretation   Problem List / ED Course / Critical interventions / Medication management  I offered the patient pain medication and nausea medication but she did not feel that the medication was necessary at this time    Test / Admission - Considered:  Patient with 9 mm proximal left ureteral stone with mild hydronephrosis.  CT results consistent with patient's presentation.  Patient has pain well-managed and nausea manage at this time.  Plan to discharge home with prescriptions for Flomax, scheduled Tylenol, or oxycodone for breakthrough pain.  Patient currently has prescription for Zofran does not need refill.  Plan to have patient follow-up with urology.  Patient does not need admission at this time.  She was provided return precautions including intractable pain or nausea, inability to urinate, which would require return to the hospital for likely admission with potential urologic intervention.  Patient voices understanding with plan.  Discharge home.        Final Clinical Impression(s) / ED Diagnoses Final diagnoses:  Kidney stone    Rx / DC Orders ED Discharge Orders          Ordered    oxyCODONE (ROXICODONE) 5 MG immediate release tablet  Every 4 hours PRN        12/21/22 0912    tamsulosin (FLOMAX) 0.4 MG CAPS capsule   Daily        12/21/22 0912              Pamala Duffel 12/21/22 0913    Lorre Nick, MD 12/21/22 [phone]

## 2022-12-21 NOTE — Discharge Instructions (Addendum)
You were diagnosed today with a 9mm left sided kidney stone. I have prescribed flomax to be taken daily. You should take 1000 mg Tylenol every six hours. Take your previously prescribed Zofran for nausea. Take Roxicodone for breakthrough pain. Follow up with urology.  If you develop intractable pain, intractable nausea vomiting, or inability to urinate, please return to the emergency department.

## 2022-12-21 NOTE — ED Triage Notes (Addendum)
Left flank pain, known kidney stone 9mm in left kidney, pain and hematuria started yesterday. Urine not as bloody appearing as earlier. Nausea without emesis

## 2022-12-22 ENCOUNTER — Emergency Department (HOSPITAL_COMMUNITY)
Admission: EM | Admit: 2022-12-22 | Discharge: 2022-12-23 | Disposition: A | Discharge status: Home / Self Care | Payer: Commercial Managed Care - PPO | Attending: Emergency Medicine | Admitting: Emergency Medicine

## 2022-12-22 ENCOUNTER — Other Ambulatory Visit: Payer: Self-pay

## 2022-12-22 ENCOUNTER — Emergency Department (HOSPITAL_COMMUNITY): Payer: Commercial Managed Care - PPO

## 2022-12-22 DIAGNOSIS — N132 Hydronephrosis with renal and ureteral calculous obstruction: Secondary | ICD-10-CM | POA: Diagnosis not present

## 2022-12-22 DIAGNOSIS — I10 Essential (primary) hypertension: Secondary | ICD-10-CM | POA: Diagnosis not present

## 2022-12-22 DIAGNOSIS — Z7901 Long term (current) use of anticoagulants: Secondary | ICD-10-CM | POA: Insufficient documentation

## 2022-12-22 DIAGNOSIS — J45909 Unspecified asthma, uncomplicated: Secondary | ICD-10-CM | POA: Insufficient documentation

## 2022-12-22 DIAGNOSIS — Z79899 Other long term (current) drug therapy: Secondary | ICD-10-CM | POA: Diagnosis not present

## 2022-12-22 DIAGNOSIS — Z794 Long term (current) use of insulin: Secondary | ICD-10-CM | POA: Diagnosis not present

## 2022-12-22 DIAGNOSIS — N201 Calculus of ureter: Secondary | ICD-10-CM

## 2022-12-22 DIAGNOSIS — Z7951 Long term (current) use of inhaled steroids: Secondary | ICD-10-CM | POA: Diagnosis not present

## 2022-12-22 DIAGNOSIS — R109 Unspecified abdominal pain: Secondary | ICD-10-CM | POA: Diagnosis present

## 2022-12-22 DIAGNOSIS — K760 Fatty (change of) liver, not elsewhere classified: Secondary | ICD-10-CM | POA: Diagnosis not present

## 2022-12-22 LAB — CBC WITH DIFFERENTIAL/PLATELET
Abs Immature Granulocytes: 0.03 10*3/uL (ref 0.00–0.07)
Basophils Absolute: 0 10*3/uL (ref 0.0–0.1)
Basophils Relative: 0 %
Eosinophils Absolute: 0.4 10*3/uL (ref 0.0–0.5)
Eosinophils Relative: 4 %
HCT: 40.4 % (ref 36.0–46.0)
Hemoglobin: 12.7 g/dL (ref 12.0–15.0)
Immature Granulocytes: 0 %
Lymphocytes Relative: 18 %
Lymphs Abs: 1.6 10*3/uL (ref 0.7–4.0)
MCH: 29.9 pg (ref 26.0–34.0)
MCHC: 31.4 g/dL (ref 30.0–36.0)
MCV: 95.1 fL (ref 80.0–100.0)
Monocytes Absolute: 0.6 10*3/uL (ref 0.1–1.0)
Monocytes Relative: 7 %
Neutro Abs: 6.2 10*3/uL (ref 1.7–7.7)
Neutrophils Relative %: 71 %
Platelets: 245 10*3/uL (ref 150–400)
RBC: 4.25 MIL/uL (ref 3.87–5.11)
RDW: 13.5 % (ref 11.5–15.5)
WBC: 8.9 10*3/uL (ref 4.0–10.5)
nRBC: 0 % (ref 0.0–0.2)

## 2022-12-22 MED ORDER — OXYCODONE-ACETAMINOPHEN 5-325 MG PO TABS
2.0000 | ORAL_TABLET | Freq: Once | ORAL | Status: AC
  Administered 2022-12-22: 2 via ORAL
  Filled 2022-12-22: qty 2

## 2022-12-22 NOTE — ED Triage Notes (Signed)
Pt arrives with c/o left sided flank pain that has worsen. Pt has a 9mm kidney stone that was partially obstructing. Pt endorses decreased urinary output.

## 2022-12-23 LAB — URINALYSIS, ROUTINE W REFLEX MICROSCOPIC
Bacteria, UA: NONE SEEN
Bilirubin Urine: NEGATIVE
Glucose, UA: NEGATIVE mg/dL
Ketones, ur: NEGATIVE mg/dL
Leukocytes,Ua: NEGATIVE
Nitrite: NEGATIVE
Protein, ur: NEGATIVE mg/dL
Specific Gravity, Urine: 1.025 (ref 1.005–1.030)
pH: 6 (ref 5.0–8.0)

## 2022-12-23 LAB — COMPREHENSIVE METABOLIC PANEL
ALT: 30 U/L (ref 0–44)
AST: 19 U/L (ref 15–41)
Albumin: 3.9 g/dL (ref 3.5–5.0)
Alkaline Phosphatase: 40 U/L (ref 38–126)
Anion gap: 11 (ref 5–15)
BUN: 19 mg/dL (ref 6–20)
CO2: 22 mmol/L (ref 22–32)
Calcium: 9.1 mg/dL (ref 8.9–10.3)
Chloride: 102 mmol/L (ref 98–111)
Creatinine, Ser: 1.31 mg/dL — ABNORMAL HIGH (ref 0.44–1.00)
GFR, Estimated: 50 mL/min — ABNORMAL LOW (ref >60)
Glucose, Bld: 121 mg/dL — ABNORMAL HIGH (ref 70–99)
Potassium: 3.4 mmol/L — ABNORMAL LOW (ref 3.5–5.1)
Sodium: 135 mmol/L (ref 135–145)
Total Bilirubin: 0.6 mg/dL (ref 0.3–1.2)
Total Protein: 7.4 g/dL (ref 6.5–8.1)

## 2022-12-23 MED ORDER — MORPHINE SULFATE (PF) 4 MG/ML IV SOLN: 4.0000 mg | Freq: Once | INTRAVENOUS | Status: DC

## 2022-12-23 MED ORDER — ONDANSETRON HCL 4 MG/2ML IJ SOLN
4.0000 mg | Freq: Once | INTRAMUSCULAR | Status: AC
  Administered 2022-12-23: 4 mg via INTRAVENOUS
  Filled 2022-12-23: qty 2

## 2022-12-23 MED ORDER — MORPHINE SULFATE (PF) 4 MG/ML IV SOLN
4.0000 mg | Freq: Once | INTRAVENOUS | Status: AC
  Administered 2022-12-23: 4 mg via INTRAVENOUS
  Filled 2022-12-23: qty 1

## 2022-12-23 MED ORDER — LACTATED RINGERS IV BOLUS
1000.0000 mL | Freq: Once | INTRAVENOUS | Status: AC
  Administered 2022-12-23: 1000 mL via INTRAVENOUS

## 2022-12-23 MED ORDER — ONDANSETRON 4 MG PO TBDP: 4.0000 mg | ORAL_TABLET | Freq: Once | ORAL | Status: DC

## 2022-12-23 MED ORDER — MAGNESIUM SULFATE 2 GM/50ML IV SOLN
2.0000 g | Freq: Once | INTRAVENOUS | Status: AC
  Administered 2022-12-23: 2 g via INTRAVENOUS
  Filled 2022-12-23: qty 50

## 2022-12-23 MED ORDER — SODIUM CHLORIDE 0.9 % IV BOLUS: 1000.0000 mL | Freq: Once | INTRAVENOUS | Status: DC

## 2022-12-23 NOTE — ED Provider Notes (Signed)
[place] EMERGENCY DEPARTMENT AT [facility] Provider Note   CSN: [id number] Arrival date & time: 12/22/22  2220     History  Chief Complaint  Patient presents with   Flank Pain    [name] is a 52 y.o. female.   Flank Pain  Patient is a 52 year old female with past medical history significant for IIH, ureterolithiasis, on chronic anticoagulation due to pulmonary emboli, palpitations, HTN, HLD, asthma, obesity  She is present emergency room today with complaints of left-sided flank pain.  She was seen 2 days ago for similar symptoms and found to have a 9 mm proximal left ureteral stone.  She states she is nauseous and has had several episodes of nonbloody nonbilious emesis.  States that her pain seems to be in the same area that it has been.  She denies any burning with urination she has not noticed gross hematuria.  She denies any vaginal pain or itching.  She denies any vaginal discharge.  No blood in her stool or diarrhea or constipation of note.     Home Medications Prior to Admission medications   Medication Sig Start Date End Date Taking? Authorizing Provider  acetaZOLAMIDE (DIAMOX) 250 MG tablet Take 1 tablet (250 mg total) by mouth 2 (two) times daily. 12/04/22  Yes Lomax, Amy, NP  albuterol (VENTOLIN HFA) 108 (90 Base) MCG/ACT inhaler Inhale 2 puffs into the lungs every 4 hours as needed for wheezing or shortness of breath. 10/15/22  Yes Hawks, Christy A, FNP  apixaban (ELIQUIS) 5 MG TABS tablet Take 1 tablet (5mg ) by mouth twice daily. 11/02/22  Yes Hawks, Christy A, FNP  cetirizine (ZYRTEC) 5 MG tablet Take 5 mg by mouth daily.   Yes [provider]  cholecalciferol (VITAMIN D3) 25 MCG (1000 UT) tablet Take 2,000 Units by mouth daily.    Yes [provider]  Cyanocobalamin (VITAMIN B 12 PO) Take 5,000 mcg by mouth daily.    Yes [provider]  Echinacea 125 MG CAPS Take 125 mg by mouth daily.    Yes [provider]   lisinopril-hydrochlorothiazide (ZESTORETIC) 20-12.5 MG tablet TAKE 1 TABLET BY MOUTH ONCE A DAY 09/19/22 09/19/23 Yes Hawks, Christy A, FNP  Magnesium 250 MG TABS Take 250 mg by mouth daily.    Yes [provider]  montelukast (SINGULAIR) 10 MG tablet Take 1 tablet (10 mg total) by mouth at bedtime. 12/18/22  Yes Hawks, Christy A, FNP  Multiple Vitamin (MULTIVITAMIN) capsule Take 1 capsule by mouth daily.   Yes [provider]  oxyCODONE (ROXICODONE) 5 MG immediate release tablet Take 1 tablet (5 mg total) by mouth every 4 (four) hours as needed for severe pain. 12/21/22  Yes Darrick Grinder, PA-C  Oxymetazoline HCl (NASAL SPRAY NA) Place 2 sprays into the nose as needed (CONGESTION).    Yes [provider]  potassium chloride SA (KLOR-CON M) 20 MEQ tablet Take 1 tablet (20 mEq total) by mouth daily. Please schedule appt for future refills. 1st attempt 07/31/22  Yes Rollene Rotunda, MD  tamsulosin (FLOMAX) 0.4 MG CAPS capsule Take 1 capsule (0.4 mg total) by mouth daily. 12/21/22 01/20/23 Yes McCauley, Dorisann Frames, PA-C  Insulin Pen Needle (UNIFINE PENTIPS) 32G X 4 MM MISC USE WITH SAXENDA TO INJECT INTO SKIN DAILY Patient not taking: Reported on 12/04/2022 03/02/21   William Hamburger D, NP  Liraglutide -Weight Management (SAXENDA) 18 MG/3ML SOPN Inject 3 mg into the skin daily. Patient not taking: Reported on  12/04/2022 04/16/22   Irene Limbo, FNP  metroNIDAZOLE (METROCREAM) 0.75 % cream Apply topically 2 (two) times daily. Patient not taking: Reported on 12/23/2022 09/18/21   Junie Spencer, FNP      Allergies    Codeine, Lyrica [pregabalin], Nyquil multi-symptom [pseudoeph-doxylamine-dm-apap], and Sulfa antibiotics    Review of Systems   Review of Systems  Genitourinary:  Positive for flank pain.    Physical Exam Updated Vital Signs BP 121/68   Pulse 77   Temp 97.6 F (36.4 C) (Oral)   Resp 15   Wt 122.5 kg   LMP 12/14/2022   SpO2 99%   BMI 44.93 kg/m   Physical Exam Vitals and nursing note reviewed.  Constitutional:      General: She is in acute distress.  HENT:     Head: Normocephalic and atraumatic.     Nose: Nose normal.  Eyes:     General: No scleral icterus. Cardiovascular:     Rate and Rhythm: Normal rate and regular rhythm.     Pulses: Normal pulses.     Heart sounds: Normal heart sounds.  Pulmonary:     Effort: Pulmonary effort is normal. No respiratory distress.     Breath sounds: No wheezing.  Abdominal:     Palpations: Abdomen is soft.     Tenderness: There is no abdominal tenderness. There is left CVA tenderness. There is no guarding or rebound.  Musculoskeletal:     Cervical back: Normal range of motion.     Right lower leg: No edema.     Left lower leg: No edema.  Skin:    General: Skin is warm and dry.     Capillary Refill: Capillary refill takes less than 2 seconds.  Neurological:     Mental Status: She is alert. Mental status is at baseline.  Psychiatric:        Mood and Affect: Mood normal.        Behavior: Behavior normal.     ED Results / Procedures / Treatments   Labs (all labs ordered are listed, but only abnormal results are displayed) Labs Reviewed  COMPREHENSIVE METABOLIC PANEL - Abnormal; Notable for the following components:      Result Value   Potassium 3.4 (*)    Glucose, Bld 121 (*)    Creatinine, Ser 1.31 (*)    GFR, Estimated 50 (*)    All other components within normal limits  URINALYSIS, ROUTINE W REFLEX MICROSCOPIC - Abnormal; Notable for the following components:   Hgb urine dipstick SMALL (*)    All other components within normal limits  CBC WITH DIFFERENTIAL/PLATELET    EKG None  Radiology CT Renal Stone Study  Result Date: 12/22/2022 CLINICAL DATA:  Abdominal/flank pain, stone suspected EXAM: CT ABDOMEN AND PELVIS WITHOUT CONTRAST TECHNIQUE: Multidetector CT imaging of the abdomen and pelvis was performed following the standard protocol without IV contrast. RADIATION  DOSE REDUCTION: This exam was performed according to the departmental dose-optimization program which includes automated exposure control, adjustment of the mA and/or kV according to patient size and/or use of iterative reconstruction technique. COMPARISON:  12/21/2022 FINDINGS: Lower chest: No acute abnormality Hepatobiliary: Diffuse low-density throughout the liver compatible with fatty infiltration. No focal abnormality. Gallbladder unremarkable. Pancreas: No focal abnormality or ductal dilatation. Spleen: No focal abnormality.  Normal size. Adrenals/Urinary Tract: 7-8 mm proximal left ureteral stone with mild left hydronephrosis. No stones or hydronephrosis on the right. Adrenal glands and urinary bladder unremarkable. Stomach/Bowel: Stomach, large and small bowel  grossly unremarkable. Prior appendectomy. Vascular/Lymphatic: No evidence of aneurysm or adenopathy. Reproductive: Uterus and adnexa unremarkable.  No mass. Other: No free fluid or free air. Musculoskeletal: No acute bony abnormality. IMPRESSION: 7-8 mm proximal left ureteral stone with mild left hydronephrosis. Hepatic steatosis. Electronically Signed   By: Charlett Nose M.D.   On: 12/22/2022 23:08   CT Renal Stone Study  Result Date: 12/21/2022 CLINICAL DATA:  Left flank pain and hematuria. EXAM: CT ABDOMEN AND PELVIS WITHOUT CONTRAST TECHNIQUE: Multidetector CT imaging of the abdomen and pelvis was performed following the standard protocol without IV contrast. RADIATION DOSE REDUCTION: This exam was performed according to the departmental dose-optimization program which includes automated exposure control, adjustment of the mA and/or kV according to patient size and/or use of iterative reconstruction technique. COMPARISON:  CT abdomen and pelvis 10/12/2022 FINDINGS: Lower chest: Clear lung bases. Hepatobiliary: Hepatic steatosis. Unremarkable gallbladder. No biliary dilatation. Pancreas: Unremarkable. Spleen: Unremarkable. Adrenals/Urinary Tract:  Unremarkable adrenal glands and right kidney. 9 mm stone in the proximal left ureter, likely reflecting interval migration of the lower pole renal stone on the prior study. Mild left hydronephrosis. Interval passage of the left UVJ stone on the prior study. Unremarkable bladder. Stomach/Bowel: The stomach is unremarkable. Mild left-sided colonic diverticulosis is noted. There is no evidence of acute bowel inflammation or bowel obstruction. Prior appendectomy. Vascular/Lymphatic: Minor abdominal aortic atherosclerosis without aneurysm. No enlarged lymph nodes. Reproductive: Uterus and bilateral adnexa are unremarkable. Other: No ascites or pneumoperitoneum. Musculoskeletal: No acute osseous abnormality or suspicious osseous lesion. IMPRESSION: 1. 9 mm proximal left ureteral stone with mild hydronephrosis. 2. Hepatic steatosis. 3.  Aortic Atherosclerosis (ICD10-I70.0). Electronically Signed   By: Sebastian Ache M.D.   On: 12/21/2022 08:30    Procedures Procedures    Medications Ordered in ED Medications  oxyCODONE-acetaminophen (PERCOCET/ROXICET) 5-325 MG per tablet 2 tablet (2 tablets Oral Given 12/22/22 2344)  magnesium sulfate IVPB 2 g 50 mL (0 g Intravenous Stopped 12/23/22 0351)  morphine (PF) 4 MG/ML injection 4 mg (4 mg Intravenous Given 12/23/22 0223)  ondansetron (ZOFRAN) injection 4 mg (4 mg Intravenous Given 12/23/22 0223)  lactated ringers bolus 1,000 mL (0 mLs Intravenous Stopped 12/23/22 0351)    ED Course/ Medical Decision Making/ A&P Clinical Course as of 12/23/22 0549  Sun Dec 23, 2022  0034 Feels that she is urinating less.   [WF]    Clinical Course User Index [WF] Gailen Shelter, [place]                             Medical Decision Making Amount and/or Complexity of Data Reviewed Labs: ordered. Radiology: ordered.  Risk Prescription drug management.   This patient presents to the ED for concern of L flank, this involves a number of treatment options, and is a complaint that  carries with it a moderate risk of complications and morbidity. A differential diagnosis was considered for the patient's symptoms which is discussed below:   The differential diagnosis of emergent flank pain includes, but is not limited to :Abdominal aortic aneurysm,, Renal artery embolism,Renal vein thrombosis, Aortic dissection, Mesenteric ischemia, Pyelonephritis, Renal infarction, Renal hemorrhage, Nephrolithiasis/ Renal Colic, Bladder tumor,Cystitis, Biliary colic, Pancreatitis Perforated peptic ulcer Appendicitis ,Inguinal Hernia, Diverticulitis, Bowel obstruction (Ectopic Pregnancy,PID/TOA,Ovarian cyst, Ovarian torsion)  Shingles Lower lobe pneumonia, Retroperitoneal hematoma/abscess/tumor, Epidural abscess, Epidural hematoma    Co morbidities: Discussed in HPI   Brief History:   Patient is a 52 year old female with past  medical history significant for IIH, ureterolithiasis, on chronic anticoagulation due to pulmonary emboli, palpitations, HTN, HLD, asthma, obesity  She is present emergency room today with complaints of left-sided flank pain.  She was seen 2 days ago for similar symptoms and found to have a 9 mm proximal left ureteral stone.  She states she is nauseous and has had several episodes of nonbloody nonbilious emesis.  States that her pain seems to be in the same area that it has been.  She denies any burning with urination she has not noticed gross hematuria.  She denies any vaginal pain or itching.  She denies any vaginal discharge.  No blood in her stool or diarrhea or constipation of note.   EMR reviewed including pt PMHx, past surgical history and past visits to ER.   See HPI for more details   Lab Tests:   I personally reviewed all laboratory work and imaging. Metabolic panel without any acute abnormality specifically kidney function within normal limits and no significant electrolyte abnormalities. CBC without leukocytosis or significant anemia. Creat is  approx WNL at 1.3 (slightly above baseline).  CBC NML  Imaging Studies:  Abnormal findings. I personally reviewed all imaging studies. Imaging notable for  IMPRESSION:  7-8 mm proximal left ureteral stone with mild left hydronephrosis.    Hepatic steatosis.      Electronically Signed    By: Charlett Nose M.D.    On: 12/22/2022 23:08    Cardiac Monitoring:  NA NA   Medicines ordered:  I ordered medication including LR, magnesium, morphine, zofran, percocet for pain Reevaluation of the patient after these medicines showed that the patient improved I have reviewed the patients home medicines and have made adjustments as needed   Critical Interventions:     Consults/Attending Physician      Reevaluation:  After the interventions noted above I re-evaluated patient and found that they have :improved   Social Determinants of Health:      Problem List / ED Course:  Ureterolithiasis - slight movement on CT since Friday.  Repeat CT scan was ordered in triage I interpreted this and compared it to CT from 2 days ago there has been mild progress of the stone.  There is mild hydronephrosis on second renal ultrasound but it does not appear to be significantly worse than prior.  Pain controlled, some improvement after magnesium.  Will discharge home with plan to follow-up with urology expediently.  Return precautions discussed   Dispostion:  After consideration of the diagnostic results and the patients response to treatment, I feel that the patent would benefit from DC home   Final Clinical Impression(s) / ED Diagnoses Final diagnoses:  Ureterolithiasis    Rx / DC Orders ED Discharge Orders     None         Gailen Shelter, [place] 12/23/22 2237    Palumbo, April, MD 12/23/22 2349

## 2022-12-23 NOTE — Discharge Instructions (Signed)
You had a 8-70mm kidney stone located in your ureter. Strain all urine with a strainer that was provided to you.  Make sure you drink plenty of water to maintain hydration.  Please use Tylenol or ibuprofen for pain.  You may use 600 mg ibuprofen every 6 hours or 1000 mg of Tylenol every 6 hours.  You may choose to alternate between the 2.  This would be most effective.  Not to exceed 4 g of Tylenol within 24 hours.  Not to exceed 3200 mg ibuprofen 24 hours.   I have prescribed or offered you other medicine for breakthrough pain. Notably there is a small amount of Tylenol--specifically 325 mg--in each dose of Percocet.  If you do take a dose of Percocet please take a 500 mg instead of the 1000 mg dose of Tylenol.  Follow up with [facility] I have given you the information for their office.

## 2022-12-24 ENCOUNTER — Other Ambulatory Visit: Payer: Self-pay | Admitting: Urology

## 2022-12-24 ENCOUNTER — Other Ambulatory Visit: Payer: Self-pay

## 2022-12-24 ENCOUNTER — Ambulatory Visit (HOSPITAL_BASED_OUTPATIENT_CLINIC_OR_DEPARTMENT_OTHER): Payer: Commercial Managed Care - PPO | Admitting: Anesthesiology

## 2022-12-24 ENCOUNTER — Ambulatory Visit (HOSPITAL_COMMUNITY): Payer: Commercial Managed Care - PPO

## 2022-12-24 ENCOUNTER — Ambulatory Visit (HOSPITAL_COMMUNITY)
Admission: RE | Admit: 2022-12-24 | Discharge: 2022-12-24 | Disposition: A | Discharge status: Home / Self Care | Payer: Commercial Managed Care - PPO | Source: Ambulatory Visit | Attending: Urology | Admitting: Urology

## 2022-12-24 ENCOUNTER — Encounter (HOSPITAL_COMMUNITY)
Admission: RE | Disposition: A | Discharge status: Home / Self Care | Payer: Self-pay | Source: Ambulatory Visit | Attending: Urology

## 2022-12-24 ENCOUNTER — Ambulatory Visit (HOSPITAL_COMMUNITY): Payer: Commercial Managed Care - PPO | Admitting: Anesthesiology

## 2022-12-24 ENCOUNTER — Encounter (HOSPITAL_COMMUNITY): Payer: Self-pay | Admitting: Urology

## 2022-12-24 DIAGNOSIS — I10 Essential (primary) hypertension: Secondary | ICD-10-CM | POA: Diagnosis not present

## 2022-12-24 DIAGNOSIS — J45909 Unspecified asthma, uncomplicated: Secondary | ICD-10-CM

## 2022-12-24 DIAGNOSIS — N209 Urinary calculus, unspecified: Secondary | ICD-10-CM | POA: Diagnosis not present

## 2022-12-24 DIAGNOSIS — N201 Calculus of ureter: Secondary | ICD-10-CM | POA: Diagnosis not present

## 2022-12-24 DIAGNOSIS — Z8249 Family history of ischemic heart disease and other diseases of the circulatory system: Secondary | ICD-10-CM | POA: Insufficient documentation

## 2022-12-24 DIAGNOSIS — Z79899 Other long term (current) drug therapy: Secondary | ICD-10-CM | POA: Diagnosis not present

## 2022-12-24 DIAGNOSIS — Z6841 Body Mass Index (BMI) 40.0 and over, adult: Secondary | ICD-10-CM

## 2022-12-24 DIAGNOSIS — N179 Acute kidney failure, unspecified: Secondary | ICD-10-CM | POA: Diagnosis not present

## 2022-12-24 DIAGNOSIS — Z419 Encounter for procedure for purposes other than remedying health state, unspecified: Secondary | ICD-10-CM

## 2022-12-24 HISTORY — PX: CYSTOSCOPY/URETEROSCOPY/HOLMIUM LASER/STENT PLACEMENT: SHX6546

## 2022-12-24 LAB — POCT PREGNANCY, URINE: Preg Test, Ur: NEGATIVE

## 2022-12-24 SURGERY — CYSTOSCOPY/URETEROSCOPY/HOLMIUM LASER/STENT PLACEMENT
Anesthesia: General | Laterality: Left

## 2022-12-24 MED ORDER — PROPOFOL 10 MG/ML IV BOLUS
INTRAVENOUS | Status: DC | PRN
  Administered 2022-12-24: 150 mg via INTRAVENOUS

## 2022-12-24 MED ORDER — SODIUM CHLORIDE 0.9% FLUSH: 3.0000 mL | INTRAVENOUS | Status: DC | PRN

## 2022-12-24 MED ORDER — ACETAMINOPHEN 10 MG/ML IV SOLN
INTRAVENOUS | Status: DC | PRN
  Administered 2022-12-24: 1000 mg via INTRAVENOUS

## 2022-12-24 MED ORDER — CHLORHEXIDINE GLUCONATE 0.12 % MT SOLN
15.0000 mL | Freq: Once | OROMUCOSAL | Status: AC
  Administered 2022-12-24: 15 mL via OROMUCOSAL

## 2022-12-24 MED ORDER — FENTANYL CITRATE PF 50 MCG/ML IJ SOSY: 25.0000 ug | PREFILLED_SYRINGE | INTRAMUSCULAR | Status: DC | PRN

## 2022-12-24 MED ORDER — ACETAMINOPHEN 10 MG/ML IV SOLN: 1000.0000 mg | Freq: Once | INTRAVENOUS | Status: DC | PRN

## 2022-12-24 MED ORDER — ACETAMINOPHEN 160 MG/5ML PO SOLN: 1000.0000 mg | Freq: Once | ORAL | Status: DC | PRN

## 2022-12-24 MED ORDER — ACETAMINOPHEN 650 MG RE SUPP: 650.0000 mg | RECTAL | Status: DC | PRN

## 2022-12-24 MED ORDER — OXYCODONE HCL 5 MG PO TABS: 5.0000 mg | ORAL_TABLET | ORAL | Status: DC | PRN

## 2022-12-24 MED ORDER — MORPHINE SULFATE (PF) 2 MG/ML IV SOLN: 2.0000 mg | INTRAVENOUS | Status: DC | PRN

## 2022-12-24 MED ORDER — ACETAMINOPHEN 10 MG/ML IV SOLN
INTRAVENOUS | Status: AC
  Filled 2022-12-24: qty 100

## 2022-12-24 MED ORDER — FENTANYL CITRATE (PF) 250 MCG/5ML IJ SOLN
INTRAMUSCULAR | Status: DC | PRN
  Administered 2022-12-24: 50 ug via INTRAVENOUS
  Administered 2022-12-24 (×2): 25 ug via INTRAVENOUS

## 2022-12-24 MED ORDER — SODIUM CHLORIDE 0.9 % IR SOLN
Status: DC | PRN
  Administered 2022-12-24: 1000 mL via INTRAVESICAL

## 2022-12-24 MED ORDER — SCOPOLAMINE 1 MG/3DAYS TD PT72
MEDICATED_PATCH | TRANSDERMAL | Status: DC | PRN
  Administered 2022-12-24: 1 via TRANSDERMAL

## 2022-12-24 MED ORDER — DEXAMETHASONE SODIUM PHOSPHATE 10 MG/ML IJ SOLN
INTRAMUSCULAR | Status: DC | PRN
  Administered 2022-12-24: 10 mg via INTRAVENOUS

## 2022-12-24 MED ORDER — ORAL CARE MOUTH RINSE: 15.0000 mL | Freq: Once | OROMUCOSAL | Status: AC

## 2022-12-24 MED ORDER — MIDAZOLAM HCL 2 MG/2ML IJ SOLN
INTRAMUSCULAR | Status: AC
  Filled 2022-12-24: qty 2

## 2022-12-24 MED ORDER — IOHEXOL 300 MG/ML  SOLN
INTRAMUSCULAR | Status: DC | PRN
  Administered 2022-12-24: 10 mL

## 2022-12-24 MED ORDER — LIDOCAINE 2% (20 MG/ML) 5 ML SYRINGE
INTRAMUSCULAR | Status: DC | PRN
  Administered 2022-12-24: 100 mg via INTRAVENOUS

## 2022-12-24 MED ORDER — EPHEDRINE SULFATE (PRESSORS) 50 MG/ML IJ SOLN
INTRAMUSCULAR | Status: DC | PRN
  Administered 2022-12-24: 5 mg via INTRAVENOUS

## 2022-12-24 MED ORDER — MIDAZOLAM HCL 5 MG/5ML IJ SOLN
INTRAMUSCULAR | Status: DC | PRN
  Administered 2022-12-24: 2 mg via INTRAVENOUS

## 2022-12-24 MED ORDER — FENTANYL CITRATE PF 50 MCG/ML IJ SOSY
100.0000 ug | PREFILLED_SYRINGE | Freq: Once | INTRAMUSCULAR | Status: AC
  Administered 2022-12-24: 100 ug via INTRAVENOUS

## 2022-12-24 MED ORDER — FENTANYL CITRATE (PF) 100 MCG/2ML IJ SOLN
INTRAMUSCULAR | Status: AC
  Filled 2022-12-24: qty 2

## 2022-12-24 MED ORDER — ACETAMINOPHEN 325 MG PO TABS: 650.0000 mg | ORAL_TABLET | ORAL | Status: DC | PRN

## 2022-12-24 MED ORDER — CEFAZOLIN IN SODIUM CHLORIDE 3-0.9 GM/100ML-% IV SOLN
3.0000 g | INTRAVENOUS | Status: AC
  Administered 2022-12-24: 3 g via INTRAVENOUS
  Filled 2022-12-24: qty 100

## 2022-12-24 MED ORDER — OXYCODONE HCL 5 MG/5ML PO SOLN: 5.0000 mg | Freq: Once | ORAL | Status: DC | PRN

## 2022-12-24 MED ORDER — SCOPOLAMINE 1 MG/3DAYS TD PT72
MEDICATED_PATCH | TRANSDERMAL | Status: AC
  Filled 2022-12-24: qty 1

## 2022-12-24 MED ORDER — SODIUM CHLORIDE 0.9% FLUSH: 3.0000 mL | Freq: Two times a day (BID) | INTRAVENOUS | Status: DC

## 2022-12-24 MED ORDER — LACTATED RINGERS IV SOLN: INTRAVENOUS | Status: DC

## 2022-12-24 MED ORDER — ACETAMINOPHEN 500 MG PO TABS: 1000.0000 mg | ORAL_TABLET | Freq: Once | ORAL | Status: DC | PRN

## 2022-12-24 MED ORDER — FENTANYL CITRATE PF 50 MCG/ML IJ SOSY
PREFILLED_SYRINGE | INTRAMUSCULAR | Status: AC
  Filled 2022-12-24: qty 2

## 2022-12-24 MED ORDER — ONDANSETRON HCL 4 MG/2ML IJ SOLN
INTRAMUSCULAR | Status: DC | PRN
  Administered 2022-12-24: 4 mg via INTRAVENOUS

## 2022-12-24 MED ORDER — KETOROLAC TROMETHAMINE 30 MG/ML IJ SOLN
INTRAMUSCULAR | Status: DC | PRN
  Administered 2022-12-24: 30 mg via INTRAVENOUS

## 2022-12-24 MED ORDER — OXYCODONE HCL 5 MG PO TABS: 5.0000 mg | ORAL_TABLET | Freq: Once | ORAL | Status: DC | PRN

## 2022-12-24 MED ORDER — SODIUM CHLORIDE 0.9 % IV SOLN: 250.0000 mL | INTRAVENOUS | Status: DC | PRN

## 2022-12-24 SURGICAL SUPPLY — 23 items
BAG URO CATCHER STRL LF (MISCELLANEOUS) ×2 IMPLANT
BASKET STONE NCOMPASS (UROLOGICAL SUPPLIES) IMPLANT
CATH URETERAL DUAL LUMEN 10F (MISCELLANEOUS) IMPLANT
CATH URETL OPEN 5X70 (CATHETERS) IMPLANT
CLOTH BEACON ORANGE TIMEOUT ST (SAFETY) ×2 IMPLANT
EXTRACTOR STONE NITINOL NGAGE (UROLOGICAL SUPPLIES) IMPLANT
GLOVE SURG SS PI 8.0 STRL IVOR (GLOVE) ×2 IMPLANT
GOWN STRL REUS W/ TWL XL LVL3 (GOWN DISPOSABLE) ×2 IMPLANT
GOWN STRL REUS W/TWL XL LVL3 (GOWN DISPOSABLE) ×1
GUIDEWIRE STR DUAL SENSOR (WIRE) ×2 IMPLANT
IV NS IRRIG 3000ML ARTHROMATIC (IV SOLUTION) ×2 IMPLANT
KIT TURNOVER KIT A (KITS) IMPLANT
LASER FIB FLEXIVA PULSE ID 365 (Laser) IMPLANT
LASER FIB FLEXIVA PULSE ID 550 (Laser) IMPLANT
LASER FIB FLEXIVA PULSE ID 910 (Laser) IMPLANT
MANIFOLD NEPTUNE II (INSTRUMENTS) ×2 IMPLANT
PACK CYSTO (CUSTOM PROCEDURE TRAY) ×2 IMPLANT
SHEATH NAVIGATOR HD 11/13X36 (SHEATH) IMPLANT
STENT URET 6FRX24 CONTOUR (STENTS) IMPLANT
TRACTIP FLEXIVA PULS ID 200XHI (Laser) IMPLANT
TRACTIP FLEXIVA PULSE ID 200 (Laser) ×1
TUBING CONNECTING 10 (TUBING) ×2 IMPLANT
TUBING UROLOGY SET (TUBING) ×2 IMPLANT

## 2022-12-24 NOTE — Anesthesia Preprocedure Evaluation (Signed)
Anesthesia Evaluation  Patient identified by MRN, date of birth, ID band Patient awake    Reviewed: Allergy & Precautions, NPO status , Patient's Chart, lab work & pertinent test results  History of Anesthesia Complications (+) PONV and history of anesthetic complications  Airway Mallampati: III  TM Distance: >3 FB Neck ROM: Full    Dental  (+) Teeth Intact, Dental Advisory Given   Pulmonary asthma , neg sleep apnea, neg recent URI   breath sounds clear to auscultation       Cardiovascular hypertension, Pt. on medications (-) angina (-) Past MI and (-) CHF  Rhythm:Regular     Neuro/Psych Chiari decompression  negative psych ROS   GI/Hepatic negative GI ROS, Neg liver ROS,,,  Endo/Other    Morbid obesity  Renal/GU ARFRenal diseaseLab Results      Component                Value               Date                      CREATININE               1.31 (H)            12/22/2022            Lab Results      Component                Value               Date                      K                        3.4 (L)             12/22/2022                Musculoskeletal negative musculoskeletal ROS (+)    Abdominal   Peds  Hematology   Anesthesia Other Findings   Reproductive/Obstetrics                             Anesthesia Physical Anesthesia Plan  ASA: 3  Anesthesia Plan: General   Post-op Pain Management: Ofirmev IV (intra-op)*   Induction: Intravenous  PONV Risk Score and Plan: 4 or greater and Ondansetron, Dexamethasone, Scopolamine patch - Pre-op and Midazolam  Airway Management Planned: LMA and Oral ETT  Additional Equipment: None  Intra-op Plan:   Post-operative Plan: Extubation in OR  Informed Consent: I have reviewed the patients History and Physical, chart, labs and discussed the procedure including the risks, benefits and alternatives for the proposed anesthesia with the  patient or authorized representative who has indicated his/her understanding and acceptance.     Dental advisory given  Plan Discussed with: CRNA  Anesthesia Plan Comments:        Anesthesia Quick Evaluation

## 2022-12-24 NOTE — Transfer of Care (Signed)
Immediate Anesthesia Transfer of Care Note  Patient: [name]  Procedure(s) Performed: CYSTOSCOPY/URETEROSCOPY/HOLMIUM LASER/STENT PLACEMENT (Left)  Patient Location: PACU  Anesthesia Type:General  Level of Consciousness: awake, alert , oriented, and patient cooperative  Airway & Oxygen Therapy: Patient Spontanous Breathing and Patient connected to face mask oxygen  Post-op Assessment: Report given to RN and Post -op Vital signs reviewed and stable  Post vital signs: Reviewed and stable  Last Vitals:  Vitals Value Taken Time  BP 97/49 12/24/22 2045  Temp 36.7 C 12/24/22 2045  Pulse 92 12/24/22 2048  Resp 13 12/24/22 2048  SpO2 100 % 12/24/22 2048  Vitals shown include unvalidated device data.  Last Pain:  Vitals:   12/24/22 2045  TempSrc:   PainSc: Asleep      Patients Stated Pain Goal: 4 (12/24/22 1707)  Complications: No notable events documented.

## 2022-12-24 NOTE — H&P (Signed)
I have ureteral stone.  HPI: [name] is a 52 year-old female established patient who is here for ureteral stone.    [name] returns today for a 6x55mm stone in the left proximal ureter with mild hydro found on CT on 4/19 and then again on 4/20. She had been seen in February for a 5mm LUVJ stone and the proximal stone was in the kidney then. She continues to have severe left flank pain with N/V and hematuria. She has no urgency or frequency. Her Cr was up to 1.31. She had a negative culture.      ALLERGIES: Codeine Lyrica NyQuil Multi-Symptom CAPS Sulfa Antibiotics     MEDICATIONS: Tamsulosin Hcl 0.4 mg capsule  Acetazolamide 250 mg tablet  Albuterol Sulfate Hfa  Calcium Carbonate  Cetirizine Hcl 5 mg tablet  Cholecalciferol  Cyanocobalamin  Echinacea Extract 125 mg capsule  Eliquis 5 mg tablet  Flomax 0.4 mg capsule  Insulin Pen Needle  Lisinopril-Hydrochlorothiazide 20 mg-12.5 mg tablet  Magnesium 250 mg tablet  Metronidazole 0.75 % cream  Montelukast Sodium 10 mg tablet  Multiple Vitamin  Oxymetazoline Hcl  Potassium Chloride 20 meq tablet, ext release, particles/crystals     GU PSH: None     PSH Notes: Chiari decompression,  ganglion cyst excision,  wrist fracture surgery  subclavian bypass graft   NON-GU PSH: Appendectomy Appendectomy (laparoscopic)     GU PMH: Renal calculus, She has a 6-91mm LUP stone and a tiny LLP stone. I am going to do metabolic w/u with a hypercalciuria profile and Litholink. KUB in 6 months. She is on acetazolamide which can contribute to stone formation but she has been on that for some time. - 04/12/2021 Ureteral calculus, she may have passed her stone but I will get a KUB to reassess. - 04/12/2021      PMH Notes: pseudotumor cerebri with chiari malformation.    NON-GU PMH: Asthma DVT, History Hypertension Pulmonary Embolism, History    FAMILY HISTORY: Atrial Fibrillation - Father Cataract - Mother Diabetes - Mother,  Father High Cholesterol - Mother Hypertension - Mother, Father Lung Cancer - Grandmother Obesity - Mother, Father Thyroid Cancer - Grandmother   SOCIAL HISTORY: Marital Status: Married Preferred Language: English; Ethnicity: Not Hispanic Or Latino; Race: White Current Smoking Status: Patient has never smoked.   Tobacco Use Assessment Completed: Used Tobacco in last 30 days? Does not use smokeless tobacco. Has never drank.  Does not use drugs. Drinks 2 caffeinated drinks per day. 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    REVIEW OF SYSTEMS:    GU Review Female:   Patient denies frequent urination, hard to postpone urination, burning /pain with urination, get up at night to urinate, leakage of urine, stream starts and stops, trouble starting your stream, have to strain to urinate, and being pregnant.  Gastrointestinal (Upper):   Patient reports nausea and vomiting. Patient denies indigestion/ heartburn.  Gastrointestinal (Lower):   Patient denies diarrhea and constipation.  Constitutional:   Patient denies fever, night sweats, weight loss, and fatigue.  Skin:   Patient denies skin rash/ lesion and itching.  Eyes:   Patient denies blurred vision and double vision.  Ears/ Nose/ Throat:   Patient denies sore throat and sinus problems.  Hematologic/Lymphatic:   Patient denies swollen glands and easy bruising.  Cardiovascular:   Patient denies leg swelling and chest pains.  Respiratory:   Patient denies cough and shortness of breath.  Endocrine:   Patient denies excessive thirst.  Musculoskeletal:   Patient denies  back pain and joint pain.  Neurological:   Patient denies headaches and dizziness.  Psychologic:   Patient denies depression and anxiety.   VITAL SIGNS:      12/24/2022 10:00 AM  Weight 270 lb / 122.47 kg  Height 66 in / 167.64 cm  BP 131/79 mmHg  Heart Rate 105 /min  Temperature 98.7 F / 37.0 C  BMI 43.6 kg/m   MULTI-SYSTEM PHYSICAL EXAMINATION:     Constitutional: Obese. No physical deformities. Normally developed. Good grooming.   Respiratory: Normal breath sounds. No labored breathing, no use of accessory muscles.   Cardiovascular: Regular rate and rhythm. No murmur, no gallop.   Skin: No paleness, no jaundice, no cyanosis. No lesion, no ulcer, no rash.  Neurologic / Psychiatric: Oriented to time, oriented to place, oriented to person. No depression, no anxiety, no agitation.  Gastrointestinal: Abdominal tenderness, left flank and LUQ pain, obese. No mass, no rigidity.   Musculoskeletal: Normal gait and station of head and neck.     Complexity of Data:  Lab Test Review:   BMP, CBC with Diff  Records Review:   Previous Patient Records  Urine Test Review:   Urinalysis, Urine Culture  X-Ray Review: C.T. Stone Protocol: Reviewed Films. Reviewed Report. Discussed With Patient.     PROCEDURES:         KUB - [id number]  A single view of the abdomen is obtained.      Patient confirmed No Neulasta OnPro Device.           Urinalysis w/Scope Dipstick Dipstick Cont'd Micro  Color: Yellow Bilirubin: Neg mg/dL WBC/hpf: 0 - 5/hpf  Appearance: Slightly Cloudy Ketones: Neg mg/dL RBC/hpf: NS (Not Seen)  Specific Gravity: 1.015 Blood: Neg ery/uL Bacteria: Few (10-25/hpf)  pH: 7.5 Protein: Neg mg/dL Cystals: NS (Not Seen)  Glucose: Neg mg/dL Urobilinogen: 0.2 mg/dL Casts: NS (Not Seen)    Nitrites: Neg Trichomonas: Not Present    Leukocyte Esterase: Neg leu/uL Mucous: Not Present      Epithelial Cells: 0 - 5/hpf      Yeast: NS (Not Seen)      Sperm: Not Present         Morphine 4mg  - 16109, J2270 Morphine 4mg  given and 0 wasted.   Qty: 4 Adm. By: Filiberto Pinks  Unit: mg Lot No [id number]  Route: IM Exp. Date 04/03/2024  Freq: None Mfgr.:   Site: Left Hip   ASSESSMENT:      ICD-10 Details  1 GU:   Ureteral calculus - N20.1 Left, Acute, Threat to Bodily Function - She has a 9mm proximal stone with intractable pain. I discussed MET,  URS and ESWL and with her ongoing pain and being on Eliquis, I will go ahead and take her for URS today.   I have reviewed the risks of ureteroscopy including bleeding, infection, ureteral injury, need for a stent or secondary procedures, thrombotic events and anesthetic complications.     PLAN:            Medications Stop Meds: Saxenda  Discontinue: 12/24/2022  - Reason: The medication cycle was completed. she has been off for several months.            Orders X-Rays: KUB          Schedule Return Visit/Planned Activity: ASAP - Schedule Surgery  Procedure: 12/24/2022 - Cysto Uretero Lithotripsy - 98119, left  Procedure: 12/24/2022 at [facility] - [phone] - Morphine 4mg  (Ther/Proph/Diag Inj, [place]/Im) - 95621, [id number]  Document Letter(s):  Created for Patient: Clinical Summary         Notes:   CC: Merry Proud NP.         Next Appointment:      Next Appointment: 01/02/2023 08:45 AM    Appointment Type: Postoperative Appointment    Location: [facility] [phone]    Provider: Bartholomew Crews, NP    Reason for Visit: post op

## 2022-12-24 NOTE — Op Note (Signed)
Procedure: 1.  Cystoscopy with left retrograde pyelogram and interpretation. 2.  Left ureteroscopy with holmium laser application, stone extraction and insertion of left double-J stent. 3.  Application of fluoroscopy.  Preop diagnosis: Left proximal ureteral stone.  Postop diagnosis: Same.  Surgeon: Dr. Bjorn Pippin.  Anesthesia: General.  Specimen: Stone fragments.  Drains: 6 French by 24 cm left contour double-J stent with tether.  EBL: None.  Complications: None.  Indications: The patient is a 52 year old female who has been seen in the emergency room twice last week with 9 mm left proximal stone with obstruction.  She presented to the office today with intractable pain and is elected undergo ureteroscopy.  Procedure: She was taken operating room where she was given antibiotic.  A general anesthetic was induced.  She was placed in lithotomy position and fitted with PAS hose.  Her perineum and genitalia were prepped Betadine solution she was draped in usual sterile fashion.  Cystoscopy was performed using the 21 [place] scope and 30 degree lens.  Examination of the low normal urethra.  The bladder wall was smooth and pale without tumors, stones or inflammation.  Ureteral orifices were unremarkable.  The left ureteral orifice was cannulated with a 5 [place] open-ended catheter and contrast was instilled.  The left retrograde pyelogram demonstrated normal caliber ureter to the level of the proximal ureter was a filling defect consistent with a stone and there was dilation above that.  Once the retrograde pyelogram had been completed, a sensor wire was advanced the kidney through the open-ended catheter and the open-ended catheter was removed.  A digital access sheath 11 French inner core was then passed over the wire to the level of the stone this was followed by the assembled 11/13 [place] 36 cm sheath.  The tip of the sheath push the stone back into the renal pelvis.  The inner core and  wire were then removed and the dual-lumen digital flexible scope was inserted.  There was some old turbid urine from bleeding in the collecting system and this was irrigated out through the sheath.  The stone was then visualized in the renal pelvis and was fragmented using the 242 tract tip laser fiber on the dusting setting on the laser.  The stone was broken into manageable fragments which primarily fell into an upper calyx.  All significant residual fragments were then removed using an engage basket.  Final inspection with both fluoroscopy and endoscopy demonstrated no significant retained fragments and only sand and grit and a couple of calyces.  The scope was then removed and a sensor wire was advanced through the sheath to the kidney.  The sheath was then removed and the cystoscope was reinserted over the wire.  A 6 French by 24 cm contour double-J stent with tether was advanced kidney under fluoroscopic guidance.  The wire was removed, leaving good coil in the kidney and a good coil in the bladder.  The bladder was drained and the cystoscope was removed leaving the stent string exiting the urethra.  The string was tied close to the meatus, trimmed to an appropriate length and type vaginally.  She was taken down from lithotomy position, her anesthetic was reversed and she was moved to recovery in stable condition.  There were no complications.  She will be given the stone fragments to bring to the office for analysis.

## 2022-12-24 NOTE — Discharge Instructions (Addendum)
The stent has a thin black string that is coming out of the urethra tube and is tucked vaginally.  You can remove the stent by pulling the attached string on Friday morning.  If you don't feel you can do that, please call the office to set up a time to have it removed.   Be very careful wiping that area.   Please bring the stone fragments to the office for analysis.

## 2022-12-24 NOTE — Anesthesia Procedure Notes (Signed)
Procedure Name: LMA Insertion Date/Time: 12/24/2022 7:45 PM  Performed by: Ponciano Ort, CRNAPre-anesthesia Checklist: Patient identified, Emergency Drugs available, Suction available and Patient being monitored Patient Re-evaluated:Patient Re-evaluated prior to induction Oxygen Delivery Method: Circle system utilized Preoxygenation: Pre-oxygenation with 100% oxygen Induction Type: IV induction LMA: LMA with gastric port inserted LMA Size: 4.0 Number of attempts: 1 Airway Equipment and Method: Oral airway Placement Confirmation: positive ETCO2 and breath sounds checked- equal and bilateral Tube secured with: Tape Dental Injury: Teeth and Oropharynx as per pre-operative assessment

## 2022-12-25 ENCOUNTER — Encounter (HOSPITAL_COMMUNITY): Payer: Self-pay | Admitting: Urology

## 2022-12-25 NOTE — Anesthesia Postprocedure Evaluation (Signed)
Anesthesia Post Note  Patient: SANDEE BERNATH  Procedure(s) Performed: CYSTOSCOPY/URETEROSCOPY/HOLMIUM LASER/STENT PLACEMENT (Left)     Patient location during evaluation: PACU Anesthesia Type: General Level of consciousness: awake and patient cooperative Pain management: pain level controlled Vital Signs Assessment: post-procedure vital signs reviewed and stable Respiratory status: spontaneous breathing, nonlabored ventilation, respiratory function stable and patient connected to nasal cannula oxygen Cardiovascular status: blood pressure returned to baseline and stable Postop Assessment: no apparent nausea or vomiting Anesthetic complications: no   No notable events documented.  Last Vitals:  Vitals:   12/24/22 2115 12/24/22 2130  BP: (!) 102/56 (!) 105/56  Pulse: 84 82  Resp: 14 15  Temp:    SpO2: 99% 100%    Last Pain:  Vitals:   12/24/22 2130  TempSrc:   PainSc: 0-No pain                 Farrel Guimond

## 2022-12-26 ENCOUNTER — Other Ambulatory Visit: Payer: Self-pay

## 2022-12-26 ENCOUNTER — Emergency Department (HOSPITAL_COMMUNITY)
Admission: EM | Admit: 2022-12-26 | Discharge: 2022-12-27 | Disposition: A | Discharge status: Home / Self Care | Payer: Commercial Managed Care - PPO | Attending: Student | Admitting: Student

## 2022-12-26 ENCOUNTER — Encounter (HOSPITAL_COMMUNITY): Payer: Self-pay | Admitting: Emergency Medicine

## 2022-12-26 DIAGNOSIS — I10 Essential (primary) hypertension: Secondary | ICD-10-CM | POA: Insufficient documentation

## 2022-12-26 DIAGNOSIS — T83123A Displacement of other urinary stents, initial encounter: Secondary | ICD-10-CM

## 2022-12-26 DIAGNOSIS — Y732 Prosthetic and other implants, materials and accessory gastroenterology and urology devices associated with adverse incidents: Secondary | ICD-10-CM | POA: Diagnosis not present

## 2022-12-26 DIAGNOSIS — T83038A Leakage of other indwelling urethral catheter, initial encounter: Secondary | ICD-10-CM | POA: Diagnosis present

## 2022-12-26 DIAGNOSIS — Z7901 Long term (current) use of anticoagulants: Secondary | ICD-10-CM | POA: Insufficient documentation

## 2022-12-26 DIAGNOSIS — T83122A Displacement of urinary stent, initial encounter: Secondary | ICD-10-CM | POA: Diagnosis not present

## 2022-12-26 DIAGNOSIS — J45909 Unspecified asthma, uncomplicated: Secondary | ICD-10-CM | POA: Insufficient documentation

## 2022-12-26 NOTE — ED Provider Notes (Signed)
[place] EMERGENCY DEPARTMENT AT [facility] Provider Note  CSN: [id number] Arrival date & time: 12/26/22 2301  Chief Complaint(s) Post-op Problem  HPI [name] is a 52 y.o. female with PMH Chiari I status post decompression, HTN, nephrolithiasis status post stent placement on 12/24/2022 who presents emergency department for evaluation of hematuria and urinary leakage.  She states that while using the restroom, the string attached to the stent fell off and now she is constantly leaking urine with a mild amount of hematuria.  Endorses a mild amount of pain at the urethral meatus but denies nausea, vomiting, chest pain, shortness of breath or other systemic symptoms.   Past Medical History Past Medical History:  Diagnosis Date   Asthma    Chiari I malformation    Chiari malformation 1996   decompression    High blood pressure    Hypertension    Idiopathic intracranial hypertension    Kidney stone    Lactose intolerance    Obesity    Pseudotumor cerebri    Subclavian bypass stenosis 07/1996   right   Swallowing difficulty    Patient Active Problem List   Diagnosis Date Noted   Intracranial hypertension 06/18/2022   Other fatigue 07/10/2021   Nephrolithiasis 04/18/2021   Anticoagulation management encounter 01/06/2021   History of pulmonary embolus (PE) 12/12/2020   Palpitations 07/12/2020   Vitamin D deficiency 09/24/2018   Insulin resistance 09/24/2018   Morbid obesity 09/08/2018   Asthma 07/28/2018   Hypokalemia 06/25/2017   Rosacea 06/21/2017   Allergic rhinitis 12/22/2015   Other hyperlipidemia 12/22/2015   Idiopathic intracranial hypertension 06/23/2015   Essential hypertension 03/02/2015   Home Medication(s) Prior to Admission medications   Medication Sig Start Date End Date Taking? Authorizing Provider  acetaZOLAMIDE (DIAMOX) 250 MG tablet Take 1 tablet (250 mg total) by mouth 2 (two) times daily. 12/04/22   Lomax, Amy, NP  albuterol  (VENTOLIN HFA) 108 (90 Base) MCG/ACT inhaler Inhale 2 puffs into the lungs every 4 hours as needed for wheezing or shortness of breath. 10/15/22   Junie Spencer, FNP  apixaban (ELIQUIS) 5 MG TABS tablet Take 1 tablet ( ) by mouth twice daily. 11/02/22   Junie Spencer, FNP  cetirizine (ZYRTEC) 5 MG tablet Take 5 mg by mouth daily.    [provider]  cholecalciferol (VITAMIN D3) 25 MCG (1000 UT) tablet Take 2,000 Units by mouth daily.     [provider]  Cyanocobalamin (VITAMIN B 12 PO) Take 5,000 mcg by mouth daily.     [provider]  Echinacea 125 MG CAPS Take 125 mg by mouth daily.     [provider]  lisinopril-hydrochlorothiazide (ZESTORETIC) 20-12.5 MG tablet TAKE 1 TABLET BY MOUTH ONCE A DAY 09/19/22 09/19/23  Jannifer Rodney A, FNP  Magnesium 250 MG TABS Take 250 mg by mouth daily.     [provider]  montelukast (SINGULAIR) 10 MG tablet Take 1 tablet (10 mg total) by mouth at bedtime. 12/18/22   Junie Spencer, FNP  Multiple Vitamin (MULTIVITAMIN) capsule Take 1 capsule by mouth daily.    [provider]  oxyCODONE (ROXICODONE) 5 MG immediate release tablet Take 1 tablet (5 mg total) by mouth every 4 (four) hours as needed for severe pain. 12/21/22   Darrick Grinder, PA-C  Oxymetazoline HCl (NASAL SPRAY NA) Place 2 sprays into the nose as needed (CONGESTION).     [provider]  potassium chloride SA (KLOR-CON M) 20  MEQ tablet Take 1 tablet (20 mEq total) by mouth daily. Please schedule appt for future refills. 1st attempt 07/31/22   Rollene Rotunda, MD  tamsulosin (FLOMAX) 0.4 MG CAPS capsule Take 1 capsule (0.4 mg total) by mouth daily. 12/21/22 01/20/23  Pamala Duffel                                                                                                                                    Past Surgical History Past Surgical History:  Procedure Laterality Date   APPENDECTOMY     chiari decompression      CYSTOSCOPY/URETEROSCOPY/HOLMIUM LASER/STENT PLACEMENT Left 12/24/2022   Procedure: CYSTOSCOPY/URETEROSCOPY/HOLMIUM LASER/STENT PLACEMENT;  Surgeon: Bjorn Pippin, MD;  Location: [facility];  Service: Urology;  Laterality: Left;   GANGLION CYST EXCISION Right    wrist   LAPAROSCOPIC APPENDECTOMY N/A 10/28/2015   Procedure: APPENDECTOMY LAPAROSCOPIC;  Surgeon: Darnell Level, MD;  Location: [facility];  Service: General;  Laterality: N/A;   pulmonary embolism Bilateral    bilateral and saddle PE   SUBCLAVIAN BYPASS GRAFT Right 07/1996   WRIST FRACTURE SURGERY Right 02/2007   Family History Family History  Problem Relation Age of Onset   Cataracts Mother    Diabetes Mother    Hypertension Mother    High Cholesterol Mother    Obesity Mother    Diabetes Father    Hypertension Father    Obesity Father    Atrial fibrillation Father    Thyroid cancer Maternal Great-grandmother    Lung cancer Paternal Grandmother     Social History Social History   Tobacco Use   Smoking status: Never   Smokeless tobacco: Never  Vaping Use   Vaping Use: Never used  Substance Use Topics   Alcohol use: No    Alcohol/week: 0.0 standard drinks of alcohol   Drug use: No   Allergies Codeine, Lyrica [pregabalin], Nyquil multi-symptom [pseudoeph-doxylamine-dm-apap], and Sulfa antibiotics  Review of Systems Review of Systems  Genitourinary:  Positive for hematuria.    Physical Exam Vital Signs  I have reviewed the triage vital signs BP (!) 172/75 (BP Location: Right Arm)   Pulse 95   Temp 98.3 F (36.8 C) (Oral) Comment: Simultaneous filing. User may not have seen previous data. Comment (Src): Simultaneous filing. User may not have seen previous data.  Resp 16 Comment: Simultaneous filing. User may not have seen previous data.  LMP 12/14/2022   SpO2 99%   Physical Exam Vitals and nursing note reviewed.  Constitutional:      General: She is not in acute distress.    Appearance: She is well-developed.   HENT:     Head: Normocephalic and atraumatic.  Eyes:     Conjunctiva/sclera: Conjunctivae normal.  Cardiovascular:     Rate and Rhythm: Normal rate and regular rhythm.     Heart sounds: No murmur heard. Pulmonary:     Effort: Pulmonary effort is normal.  No respiratory distress.     Breath sounds: Normal breath sounds.  Abdominal:     Palpations: Abdomen is soft.     Tenderness: There is no abdominal tenderness.  Genitourinary:    Comments: Urinary stent visible in the urethral meatus Musculoskeletal:        General: No swelling.     Cervical back: Neck supple.  Skin:    General: Skin is warm and dry.     Capillary Refill: Capillary refill takes less than 2 seconds.  Neurological:     Mental Status: She is alert.  Psychiatric:        Mood and Affect: Mood normal.     ED Results and Treatments Labs (all labs ordered are listed, but only abnormal results are displayed) Labs Reviewed - No data to display                                                                                                                        Radiology No results found.  Pertinent labs & imaging results that were available during my care of the patient were reviewed by me and considered in my medical decision making (see MDM for details).  Medications Ordered in ED Medications - No data to display                                                                                                                                   Procedures Procedures  (including critical care time)  Medical Decision Making / ED Course   This patient presents to the ED for concern of hematuria, urinary leakage, this involves an extensive number of treatment options, and is a complaint that carries with it a high risk of complications and morbidity.  The differential diagnosis includes urinary stent displacement, false tract, fistula, UTI, surgical complication  MDM: Patient seen emergency room for evaluation  of hematuria and urinary leakage status post urinary stent placement.  Physical exam with stent visible at the urethral meatus.  I spoke with Dr. Laverle Patter of urology who states that if the stent is visible, it can be removed at bedside and patient can follow-up with urology.  The stent was successfully removed at bedside with no complications.  Patient then discharged with outpatient urology follow-up and return precautions.   Additional history obtained: -Additional history obtained from family member -External records from outside source obtained and reviewed including: Chart review  including previous notes, labs, imaging, consultation notes    Medicines ordered and prescription drug management: No orders of the defined types were placed in this encounter.   -I have reviewed the patients home medicines and have made adjustments as needed  Critical interventions none  Consultations Obtained: I requested consultation with the urologist on-call Dr. Laverle Patter,  and discussed lab and imaging findings as well as pertinent plan - they recommend: Remove the stent if visible   Cardiac Monitoring: The patient was maintained on a cardiac monitor.  I personally viewed and interpreted the cardiac monitored which showed an underlying rhythm of: NSR  Social Determinants of Health:  Factors impacting patients care include: none   Reevaluation: After the interventions noted above, I reevaluated the patient and found that they have :improved  Co morbidities that complicate the patient evaluation  Past Medical History:  Diagnosis Date   Asthma    Chiari I malformation    Chiari malformation 1996   decompression    High blood pressure    Hypertension    Idiopathic intracranial hypertension    Kidney stone    Lactose intolerance    Obesity    Pseudotumor cerebri    Subclavian bypass stenosis 07/1996   right   Swallowing difficulty       Dispostion: I considered admission for this  patient, but at this time with stent successfully removed, patient does not meet inpatient criteria for admission she is safe for discharge with outpatient follow-up     Final Clinical Impression(s) / ED Diagnoses Final diagnoses:  Displacement of urinary stent, initial encounter     @PCDICTATION @    Glendora Score, MD 12/27/22 [phone]

## 2022-12-26 NOTE — ED Triage Notes (Signed)
Pt arriving POV with stent issue. Pt reports having a stent placed on the 22nd of this month and today while she was urinating, the string came out. Stent is now leaking steadily.

## 2022-12-27 NOTE — Discharge Instructions (Signed)
You were seen in the emerged part for evaluation of hematuria and suspected dislodgment of your urinary stent.  The stent was successfully removed today in the emergency department.  Please call the urology office tomorrow to inform them of what happened in the emergency department today and keep your follow-up appointment for Friday.  You can continue to take your pain medicine as prescribed and at this time you are safe for discharge.  Please return to Emergency Department if you have new or worsening hematuria, severe uncontrolled pain, fever, vomiting or any other concerning symptoms.

## 2023-01-01 LAB — CALCULI, WITH PHOTOGRAPH (CLINICAL LAB)
Calcium Oxalate Dihydrate: 40 %
Calcium Oxalate Monohydrate: 20 %
Hydroxyapatite: 40 %
Weight Calculi: 30 mg

## 2023-01-02 DIAGNOSIS — N201 Calculus of ureter: Secondary | ICD-10-CM | POA: Diagnosis not present

## 2023-01-16 ENCOUNTER — Ambulatory Visit: Payer: 59 | Admitting: Hematology and Oncology

## 2023-01-16 ENCOUNTER — Other Ambulatory Visit: Payer: 59

## 2023-01-18 ENCOUNTER — Inpatient Hospital Stay: Payer: Commercial Managed Care - PPO | Attending: Hematology and Oncology

## 2023-01-18 ENCOUNTER — Other Ambulatory Visit: Payer: Self-pay | Admitting: Hematology and Oncology

## 2023-01-18 ENCOUNTER — Inpatient Hospital Stay: Payer: Commercial Managed Care - PPO | Admitting: Hematology and Oncology

## 2023-01-18 ENCOUNTER — Other Ambulatory Visit: Payer: Self-pay

## 2023-01-18 VITALS — BP 133/75 | HR 85 | Temp 98.0°F | Resp 17 | Ht 66.0 in | Wt 281.8 lb

## 2023-01-18 DIAGNOSIS — Z86718 Personal history of other venous thrombosis and embolism: Secondary | ICD-10-CM | POA: Diagnosis not present

## 2023-01-18 DIAGNOSIS — Z7901 Long term (current) use of anticoagulants: Secondary | ICD-10-CM | POA: Diagnosis not present

## 2023-01-18 DIAGNOSIS — Z86711 Personal history of pulmonary embolism: Secondary | ICD-10-CM | POA: Diagnosis not present

## 2023-01-18 DIAGNOSIS — I2699 Other pulmonary embolism without acute cor pulmonale: Secondary | ICD-10-CM | POA: Diagnosis not present

## 2023-01-18 DIAGNOSIS — I825Z1 Chronic embolism and thrombosis of unspecified deep veins of right distal lower extremity: Secondary | ICD-10-CM

## 2023-01-18 LAB — CBC WITH DIFFERENTIAL (CANCER CENTER ONLY)
Abs Immature Granulocytes: 0.01 10*3/uL (ref 0.00–0.07)
Basophils Absolute: 0.1 10*3/uL (ref 0.0–0.1)
Basophils Relative: 1 %
Eosinophils Absolute: 0.4 10*3/uL (ref 0.0–0.5)
Eosinophils Relative: 6 %
HCT: 36.8 % (ref 36.0–46.0)
Hemoglobin: 12.3 g/dL (ref 12.0–15.0)
Immature Granulocytes: 0 %
Lymphocytes Relative: 26 %
Lymphs Abs: 2 10*3/uL (ref 0.7–4.0)
MCH: 30.8 pg (ref 26.0–34.0)
MCHC: 33.4 g/dL (ref 30.0–36.0)
MCV: 92.2 fL (ref 80.0–100.0)
Monocytes Absolute: 0.5 10*3/uL (ref 0.1–1.0)
Monocytes Relative: 7 %
Neutro Abs: 4.7 10*3/uL (ref 1.7–7.7)
Neutrophils Relative %: 60 %
Platelet Count: 224 10*3/uL (ref 150–400)
RBC: 3.99 MIL/uL (ref 3.87–5.11)
RDW: 13.5 % (ref 11.5–15.5)
WBC Count: 7.7 10*3/uL (ref 4.0–10.5)
nRBC: 0 % (ref 0.0–0.2)

## 2023-01-18 LAB — CMP (CANCER CENTER ONLY)
ALT: 38 U/L (ref 0–44)
AST: 20 U/L (ref 15–41)
Albumin: 4.1 g/dL (ref 3.5–5.0)
Alkaline Phosphatase: 43 U/L (ref 38–126)
Anion gap: 8 (ref 5–15)
BUN: 16 mg/dL (ref 6–20)
CO2: 20 mmol/L — ABNORMAL LOW (ref 22–32)
Calcium: 9 mg/dL (ref 8.9–10.3)
Chloride: 109 mmol/L (ref 98–111)
Creatinine: 0.84 mg/dL (ref 0.44–1.00)
GFR, Estimated: >60 mL/min (ref >60)
Glucose, Bld: 97 mg/dL (ref 70–99)
Potassium: 3.4 mmol/L — ABNORMAL LOW (ref 3.5–5.1)
Sodium: 137 mmol/L (ref 135–145)
Total Bilirubin: 0.4 mg/dL (ref 0.3–1.2)
Total Protein: 7 g/dL (ref 6.5–8.1)

## 2023-01-18 NOTE — Progress Notes (Signed)
[facility] [phone]   [phone]  PROGRESS NOTE  Patient Care Team: Junie Spencer, FNP as PCP - General (Family Medicine)  Hematological/Oncological History # Pulmonary embolism  1) 12/12/2020-12/14/2020: Admitted for bilateral pulmonary emboli with saddle embolus with CT evidence of right heart strain. Ultrasound of lower extremities confirmed right popliteal vein DVT. Patient was treated with heparin drip for 48 hours.  Echocardiogram showed EF of 65 to 70% and findings of RV overload related to pulmonary embolism. Patient was discharged on Eliquis10 mg twice daily for for 7 days followed by 5 mg twice daily.  2) 01/06/2021: Establish care with Georga Kaufmann PA-C   HISTORY OF PRESENTING ILLNESS:  [name] 52 y.o. female returns for a follow up for history of pulmonary embolism while on anticoagulation.   On exam today, [name] reports she is tolerating her Eliquis well without any difficulty.  She is not having any bleeding or bruising.  She reports that she pays about $30 for 70-month supply and is able to afford this without difficulty.  She does not have any signs or symptoms concerning for new blood clot such as chest pain, shortness of breath, or leg swelling.  She notes that she is have a kidney stone in April.  She had the distemper early discontinue her Eliquis therapy at that time.  She has had no other unintentional missed dosages.  She also does not have any excessive bleeding with her kidney stone.  She reports that she does continue to have menstrual cycles which are not particularly heavy though she think she may be starting to have hot flashes in the initial symptoms of menopause.  Patient denies any fevers, chills, night sweats, chest pain,cough, edema or skin changes. She has no other complaints. Rest of the 10 point ROS is below.   MEDICAL HISTORY:  Past Medical History:  Diagnosis Date   Asthma    Chiari I malformation  (HCC)    Chiari malformation 1996   decompression    High blood pressure    Hypertension    Idiopathic intracranial hypertension    Kidney stone    Lactose intolerance    Obesity    Pseudotumor cerebri    Subclavian bypass stenosis (HCC) 07/1996   right   Swallowing difficulty     SURGICAL HISTORY: Past Surgical History:  Procedure Laterality Date   APPENDECTOMY     chiari decompression     CYSTOSCOPY/URETEROSCOPY/HOLMIUM LASER/STENT PLACEMENT Left 12/24/2022   Procedure: CYSTOSCOPY/URETEROSCOPY/HOLMIUM LASER/STENT PLACEMENT;  Surgeon: Bjorn Pippin, MD;  Location: [facility];  Service: Urology;  Laterality: Left;   GANGLION CYST EXCISION Right    wrist   LAPAROSCOPIC APPENDECTOMY N/A 10/28/2015   Procedure: APPENDECTOMY LAPAROSCOPIC;  Surgeon: Darnell Level, MD;  Location: [facility];  Service: General;  Laterality: N/A;   pulmonary embolism Bilateral    bilateral and saddle PE   SUBCLAVIAN BYPASS GRAFT Right 07/1996   WRIST FRACTURE SURGERY Right 02/2007    SOCIAL HISTORY: Social History   Socioeconomic History   Marital status: Married    Spouse name: Johnny   Number of children: 0   Years of education: 14   Highest education level: Not on file  Occupational History   Occupation: CT Theme park manager: [employer]   Occupation: ct tech  Tobacco Use   Smoking status: Never   Smokeless tobacco: Never  Vaping Use   Vaping Use: Never used  Substance and Sexual Activity  Alcohol use: No    Alcohol/week: 0.0 standard drinks of alcohol   Drug use: No   Sexual activity: Not on file  Other Topics Concern   Not on file  Social History Narrative   Married, no children, lives at home   Caffeine use- 2-3 cups coffee 5 days a week   Social Determinants of Corporate investment banker Strain: Not on file  Food Insecurity: Not on file  Transportation Needs: Not on file  Physical Activity: Not on file  Stress: Not on file  Social Connections: Not on file  Intimate  Partner Violence: Not on file    FAMILY HISTORY: Family History  Problem Relation Age of Onset   Cataracts Mother    Diabetes Mother    Hypertension Mother    High Cholesterol Mother    Obesity Mother    Diabetes Father    Hypertension Father    Obesity Father    Atrial fibrillation Father    Thyroid cancer Maternal Great-grandmother    Lung cancer Paternal Grandmother     ALLERGIES:  is allergic to codeine, lyrica [pregabalin], nyquil multi-symptom [pseudoeph-doxylamine-dm-apap], and sulfa antibiotics.  MEDICATIONS:  Current Outpatient Medications  Medication Sig Dispense Refill   acetaZOLAMIDE (DIAMOX) 250 MG tablet Take 1 tablet (250 mg total) by mouth 2 (two) times daily. 180 tablet 3   albuterol (VENTOLIN HFA) 108 (90 Base) MCG/ACT inhaler Inhale 2 puffs into the lungs every 4 hours as needed for wheezing or shortness of breath. 18 g 2   apixaban (ELIQUIS) 5 MG TABS tablet Take 1 tablet (5mg ) by mouth twice daily. 180 tablet 0   cetirizine (ZYRTEC) 5 MG tablet Take 5 mg by mouth daily.     cholecalciferol (VITAMIN D3) 25 MCG (1000 UT) tablet Take 2,000 Units by mouth daily.      Cyanocobalamin (VITAMIN B 12 PO) Take 5,000 mcg by mouth daily.      Echinacea 125 MG CAPS Take 125 mg by mouth daily.      lisinopril-hydrochlorothiazide (ZESTORETIC) 20-12.5 MG tablet TAKE 1 TABLET BY MOUTH ONCE A DAY 90 tablet 1   Magnesium 250 MG TABS Take 250 mg by mouth daily.      montelukast (SINGULAIR) 10 MG tablet Take 1 tablet (10 mg total) by mouth at bedtime. 90 tablet 1   Multiple Vitamin (MULTIVITAMIN) capsule Take 1 capsule by mouth daily.     oxyCODONE (ROXICODONE) 5 MG immediate release tablet Take 1 tablet (5 mg total) by mouth every 4 (four) hours as needed for severe pain. 30 tablet 0   Oxymetazoline HCl (NASAL SPRAY NA) Place 2 sprays into the nose as needed (CONGESTION).      potassium chloride SA (KLOR-CON M) 20 MEQ tablet Take 1 tablet (20 mEq total) by mouth daily. Please  schedule appt for future refills. 1st attempt 90 tablet 3   tamsulosin (FLOMAX) 0.4 MG CAPS capsule Take 1 capsule (0.4 mg total) by mouth daily. 30 capsule 0   No current facility-administered medications for this visit.    REVIEW OF SYSTEMS:   Constitutional: ( - ) fevers, ( - )  chills , ( - ) night sweats Eyes: ( - ) blurriness of vision, ( - ) double vision, ( - ) watery eyes Ears, nose, mouth, throat, and face: ( - ) mucositis, ( - ) sore throat Respiratory: ( - ) cough, ( +) dyspnea, ( - ) wheezes Cardiovascular: ( - ) palpitation, ( - ) chest discomfort, ( - )  lower extremity swelling Gastrointestinal:  ( - ) nausea, ( - ) heartburn, ( - ) change in bowel habits Skin: ( - ) abnormal skin rashes Lymphatics: ( - ) new lymphadenopathy, ( - ) easy bruising Neurological: ( - ) numbness, ( - ) tingling, ( - ) new weaknesses Behavioral/Psych: ( - ) mood change, ( - ) new changes  All other systems were reviewed with the patient and are negative.  PHYSICAL EXAMINATION: ECOG PERFORMANCE STATUS: 1 - Symptomatic but completely ambulatory  Vitals:   01/18/23 0925  BP: 133/75  Pulse: 85  Resp: 17  Temp: 98 F (36.7 C)  SpO2: 99%     Filed Weights   01/18/23 0925  Weight: 281 lb 12.8 oz (127.8 kg)      GENERAL: well appearing female in NAD  SKIN: skin color, texture, turgor are normal, no rashes or significant lesions EYES: conjunctiva are pink and non-injected, sclera clear LUNGS: clear to auscultation and percussion with normal breathing effort HEART: regular rate & rhythm and no murmurs and no lower extremity edema Musculoskeletal: no cyanosis of digits and no clubbing  PSYCH: alert & oriented x 3, fluent speech NEURO: no focal motor/sensory deficits  LABORATORY DATA:  I have reviewed the data as listed    Latest Ref Rng & Units 01/18/2023    8:55 AM 12/22/2022   10:47 PM 12/21/2022    7:36 AM  CBC  WBC 4.0 - 10.5 K/uL 7.7  8.9  8.2   Hemoglobin 12.0 - 15.0 g/dL  95.2  84.1  32.4   Hematocrit 36.0 - 46.0 % 36.8  40.4  41.2   Platelets 150 - 400 K/uL 224  245  259        Latest Ref Rng & Units 01/18/2023    8:55 AM 12/22/2022   10:47 PM 12/21/2022    7:36 AM  CMP  Glucose 70 - 99 mg/dL 97  401  027   BUN 6 - 20 mg/dL 16  19  17    Creatinine 0.44 - 1.00 mg/dL 2.53  6.64  4.03   Sodium 135 - 145 mmol/L 137  135  135   Potassium 3.5 - 5.1 mmol/L 3.4  3.4  4.1   Chloride 98 - 111 mmol/L 109  102  107   CO2 22 - 32 mmol/L 20  22  17    Calcium 8.9 - 10.3 mg/dL 9.0  9.1  8.8   Total Protein 6.5 - 8.1 g/dL 7.0  7.4    Total Bilirubin 0.3 - 1.2 mg/dL 0.4  0.6    Alkaline Phos 38 - 126 U/L 43  40    AST 15 - 41 U/L 20  19    ALT 0 - 44 U/L 38  30     RADIOGRAPHIC STUDIES: I have personally reviewed the radiological images as listed and agreed with the findings in the report. DG C-Arm 1-60 Min-No Report  Result Date: 12/24/2022 Fluoroscopy was utilized by the requesting physician.  No radiographic interpretation.   CT Renal Stone Study  Result Date: 12/22/2022 CLINICAL DATA:  Abdominal/flank pain, stone suspected EXAM: CT ABDOMEN AND PELVIS WITHOUT CONTRAST TECHNIQUE: Multidetector CT imaging of the abdomen and pelvis was performed following the standard protocol without IV contrast. RADIATION DOSE REDUCTION: This exam was performed according to the departmental dose-optimization program which includes automated exposure control, adjustment of the mA and/or kV according to patient size and/or use of iterative reconstruction technique. COMPARISON:  12/21/2022 FINDINGS: Lower chest:  No acute abnormality Hepatobiliary: Diffuse low-density throughout the liver compatible with fatty infiltration. No focal abnormality. Gallbladder unremarkable. Pancreas: No focal abnormality or ductal dilatation. Spleen: No focal abnormality.  Normal size. Adrenals/Urinary Tract: 7-8 mm proximal left ureteral stone with mild left hydronephrosis. No stones or hydronephrosis on the  right. Adrenal glands and urinary bladder unremarkable. Stomach/Bowel: Stomach, large and small bowel grossly unremarkable. Prior appendectomy. Vascular/Lymphatic: No evidence of aneurysm or adenopathy. Reproductive: Uterus and adnexa unremarkable.  No mass. Other: No free fluid or free air. Musculoskeletal: No acute bony abnormality. IMPRESSION: 7-8 mm proximal left ureteral stone with mild left hydronephrosis. Hepatic steatosis. Electronically Signed   By: Charlett Nose M.D.   On: 12/22/2022 23:08   CT Renal Stone Study  Result Date: 12/21/2022 CLINICAL DATA:  Left flank pain and hematuria. EXAM: CT ABDOMEN AND PELVIS WITHOUT CONTRAST TECHNIQUE: Multidetector CT imaging of the abdomen and pelvis was performed following the standard protocol without IV contrast. RADIATION DOSE REDUCTION: This exam was performed according to the departmental dose-optimization program which includes automated exposure control, adjustment of the mA and/or kV according to patient size and/or use of iterative reconstruction technique. COMPARISON:  CT abdomen and pelvis 10/12/2022 FINDINGS: Lower chest: Clear lung bases. Hepatobiliary: Hepatic steatosis. Unremarkable gallbladder. No biliary dilatation. Pancreas: Unremarkable. Spleen: Unremarkable. Adrenals/Urinary Tract: Unremarkable adrenal glands and right kidney. 9 mm stone in the proximal left ureter, likely reflecting interval migration of the lower pole renal stone on the prior study. Mild left hydronephrosis. Interval passage of the left UVJ stone on the prior study. Unremarkable bladder. Stomach/Bowel: The stomach is unremarkable. Mild left-sided colonic diverticulosis is noted. There is no evidence of acute bowel inflammation or bowel obstruction. Prior appendectomy. Vascular/Lymphatic: Minor abdominal aortic atherosclerosis without aneurysm. No enlarged lymph nodes. Reproductive: Uterus and bilateral adnexa are unremarkable. Other: No ascites or pneumoperitoneum.  Musculoskeletal: No acute osseous abnormality or suspicious osseous lesion. IMPRESSION: 1. 9 mm proximal left ureteral stone with mild hydronephrosis. 2. Hepatic steatosis. 3.  Aortic Atherosclerosis (ICD10-I70.0). Electronically Signed   By: Sebastian Ache M.D.   On: 12/21/2022 08:30     ASSESSMENT & PLAN [name] is a 52 y.o. female presents to the clinic for history of bilateral pulmonary emboli and RLE DVT. She is currently on Eliquis 5 mg twice daily.  # Bilateral pulmonary emboli with saddle embolism and RLE DVT: --Recommend indefinite anticoagulation due to unprovoked nature. --Labs from 01/06/2021 showed no evidence of antiphospholipid syndrome.  --Labs showed white blood cell 7.7, hemoglobin 12.3, MCV 92.2, and platelets of 224 --Currently on Eliquis 5 mg BID and recommend to continue.  --RTC in 1 year with repeat labs.   #Upcoming procedures: --Patient can proceed with necessary surgical procedures including colonoscopy. Recommend to hold Eliquis 48 hours before procedure and resume in 24 hours once there is hemostasis.   No orders of the defined types were placed in this encounter.   All questions were answered. The patient knows to call the clinic with any problems, questions or concerns.  I have spent a total of 25 minutes minutes of face-to-face and non-face-to-face time, preparing to see the patient, performing a medically appropriate examination, counseling and educating the patient, documenting clinical information in the electronic health record, and care coordination.   Ulysees Barns, MD Department of Hematology/Oncology [facility] Phone: [phone] Pager: [phone] Email: [email or web address]

## 2023-01-27 ENCOUNTER — Other Ambulatory Visit: Payer: Self-pay | Admitting: Family

## 2023-01-28 ENCOUNTER — Other Ambulatory Visit: Payer: Self-pay

## 2023-01-29 ENCOUNTER — Other Ambulatory Visit (HOSPITAL_COMMUNITY): Payer: Self-pay

## 2023-01-29 MED ORDER — APIXABAN 5 MG PO TABS
5.0000 mg | ORAL_TABLET | Freq: Two times a day (BID) | ORAL | 0 refills | Status: DC
  Filled 2023-01-29: qty 180, 90d supply, fill #0

## 2023-02-01 ENCOUNTER — Other Ambulatory Visit (HOSPITAL_COMMUNITY): Payer: Self-pay

## 2023-02-25 ENCOUNTER — Other Ambulatory Visit (HOSPITAL_COMMUNITY): Payer: Self-pay

## 2023-02-25 ENCOUNTER — Other Ambulatory Visit: Payer: Self-pay

## 2023-03-24 ENCOUNTER — Other Ambulatory Visit: Payer: Self-pay

## 2023-03-24 ENCOUNTER — Other Ambulatory Visit: Payer: Self-pay | Admitting: Family

## 2023-03-24 DIAGNOSIS — I10 Essential (primary) hypertension: Secondary | ICD-10-CM

## 2023-03-24 DIAGNOSIS — G932 Benign intracranial hypertension: Secondary | ICD-10-CM

## 2023-03-25 ENCOUNTER — Other Ambulatory Visit (HOSPITAL_COMMUNITY): Payer: Self-pay

## 2023-03-25 MED ORDER — LISINOPRIL-HYDROCHLOROTHIAZIDE 20-12.5 MG PO TABS
1.0000 | ORAL_TABLET | Freq: Every day | ORAL | 1 refills | Status: DC
  Filled 2023-03-25: qty 90, 90d supply, fill #0
  Filled 2023-06-23: qty 90, 90d supply, fill #1

## 2023-04-04 ENCOUNTER — Telehealth (INDEPENDENT_AMBULATORY_CARE_PROVIDER_SITE_OTHER): Payer: Self-pay | Admitting: Family Medicine

## 2023-04-04 NOTE — Telephone Encounter (Signed)
Received PA for Saxenda via CoverMyMeds. Tried to complete PA, could not complete as patient's plan does not cover this medication. Patient also has not been seen here since 06/2022. Message has been sent to patient.   Information regarding your request This drug/product is not covered under the pharmacy benefit. Prior Authorization is not available.

## 2023-04-30 ENCOUNTER — Other Ambulatory Visit (HOSPITAL_COMMUNITY): Payer: Self-pay

## 2023-04-30 ENCOUNTER — Other Ambulatory Visit: Payer: Self-pay

## 2023-04-30 ENCOUNTER — Other Ambulatory Visit: Payer: Self-pay | Admitting: Family

## 2023-04-30 MED ORDER — APIXABAN 5 MG PO TABS
5.0000 mg | ORAL_TABLET | Freq: Two times a day (BID) | ORAL | 0 refills | Status: DC
  Filled 2023-04-30: qty 60, 30d supply, fill #0

## 2023-05-08 ENCOUNTER — Other Ambulatory Visit: Payer: Self-pay | Admitting: Family

## 2023-05-08 DIAGNOSIS — N202 Calculus of kidney with calculus of ureter: Secondary | ICD-10-CM | POA: Diagnosis not present

## 2023-05-08 DIAGNOSIS — Z1231 Encounter for screening mammogram for malignant neoplasm of breast: Secondary | ICD-10-CM

## 2023-05-13 ENCOUNTER — Encounter: Payer: Self-pay | Admitting: Family

## 2023-05-13 ENCOUNTER — Ambulatory Visit: Payer: Commercial Managed Care - PPO | Admitting: Family

## 2023-05-13 ENCOUNTER — Other Ambulatory Visit (HOSPITAL_COMMUNITY): Payer: Self-pay

## 2023-05-13 ENCOUNTER — Ambulatory Visit: Admission: RE | Admit: 2023-05-13 | Payer: Commercial Managed Care - PPO | Source: Ambulatory Visit

## 2023-05-13 VITALS — BP 109/73 | HR 81 | Temp 98.0°F | Ht 66.0 in | Wt 274.0 lb

## 2023-05-13 DIAGNOSIS — G932 Benign intracranial hypertension: Secondary | ICD-10-CM | POA: Diagnosis not present

## 2023-05-13 DIAGNOSIS — J4520 Mild intermittent asthma, uncomplicated: Secondary | ICD-10-CM | POA: Diagnosis not present

## 2023-05-13 DIAGNOSIS — I10 Essential (primary) hypertension: Secondary | ICD-10-CM

## 2023-05-13 DIAGNOSIS — Z86711 Personal history of pulmonary embolism: Secondary | ICD-10-CM

## 2023-05-13 DIAGNOSIS — E7849 Other hyperlipidemia: Secondary | ICD-10-CM | POA: Diagnosis not present

## 2023-05-13 LAB — CMP14+EGFR
ALT: 29 IU/L (ref 0–32)
AST: 18 IU/L (ref 0–40)
Albumin: 4 g/dL (ref 3.9–4.9)
Alkaline Phosphatase: 52 IU/L (ref 44–121)
BUN/Creatinine Ratio: 16 (ref 9–23)
BUN: 14 mg/dL (ref 6–24)
Bilirubin Total: 0.3 mg/dL (ref 0.0–1.2)
CO2: 18 mmol/L — ABNORMAL LOW (ref 20–29)
Calcium: 9.5 mg/dL (ref 8.7–10.2)
Chloride: 105 mmol/L (ref 96–106)
Creatinine, Ser: 0.9 mg/dL (ref 0.57–1.00)
Globulin, Total: 2.7 g/dL (ref 1.5–4.5)
Glucose: 96 mg/dL (ref 70–99)
Potassium: 4.6 mmol/L (ref 3.5–5.2)
Sodium: 140 mmol/L (ref 134–144)
Total Protein: 6.7 g/dL (ref 6.0–8.5)
eGFR: 78 mL/min/{1.73_m2} (ref >59)

## 2023-05-13 LAB — CBC WITH DIFFERENTIAL/PLATELET
Basophils Absolute: 0.1 10*3/uL (ref 0.0–0.2)
Basos: 1 %
EOS (ABSOLUTE): 0.2 10*3/uL (ref 0.0–0.4)
Eos: 3 %
Hematocrit: 42.9 % (ref 34.0–46.6)
Hemoglobin: 14 g/dL (ref 11.1–15.9)
Immature Grans (Abs): 0 10*3/uL (ref 0.0–0.1)
Immature Granulocytes: 0 %
Lymphocytes Absolute: 1.6 10*3/uL (ref 0.7–3.1)
Lymphs: 25 %
MCH: 30.6 pg (ref 26.6–33.0)
MCHC: 32.6 g/dL (ref 31.5–35.7)
MCV: 94 fL (ref 79–97)
Monocytes Absolute: 0.4 10*3/uL (ref 0.1–0.9)
Monocytes: 7 %
Neutrophils Absolute: 3.9 10*3/uL (ref 1.4–7.0)
Neutrophils: 64 %
Platelets: 243 10*3/uL (ref 150–450)
RBC: 4.57 x10E6/uL (ref 3.77–5.28)
RDW: 12.9 % (ref 11.7–15.4)
WBC: 6.1 10*3/uL (ref 3.4–10.8)

## 2023-05-13 MED ORDER — APIXABAN 5 MG PO TABS
5.0000 mg | ORAL_TABLET | Freq: Two times a day (BID) | ORAL | 1 refills | Status: DC
  Filled 2023-05-13 – 2023-06-11 (×2): qty 180, 90d supply, fill #0
  Filled 2023-09-04: qty 180, 90d supply, fill #1

## 2023-05-13 NOTE — Patient Instructions (Signed)

## 2023-05-13 NOTE — Progress Notes (Signed)
Subjective:    Patient ID: [name], female    DOB: 1971/02/20, 52 y.o.   MRN: [id number]  Chief Complaint  Patient presents with   Medical Management of Chronic Issues   Pt presents to the office today for chronic follow up. Pt has idiopathic intracranial hypertension and is followed by Dr. Marjory Lies, neurologists annually.    She has rosacea and uses metronidazole 0.75% as needed.    She has hx of PE and taking Eliquis. Has intermittent SOB and followed by Hematologist.  Hypertension This is a chronic problem. The current episode started more than 1 year ago. The problem has been resolved since onset. The problem is controlled. Associated symptoms include malaise/fatigue. Pertinent negatives include no peripheral edema or shortness of breath. Risk factors for coronary artery disease include obesity. The current treatment provides moderate improvement.  Asthma There is no cough, shortness of breath or wheezing. This is a chronic problem. The current episode started more than 1 year ago. The problem occurs intermittently. Associated symptoms include malaise/fatigue. Her symptoms are alleviated by rest. She reports moderate improvement on treatment. Her past medical history is significant for asthma.  Hyperlipidemia This is a chronic problem. The current episode started more than 1 year ago. The problem is controlled. Recent lipid tests were reviewed and are normal. Exacerbating diseases include obesity. Pertinent negatives include no shortness of breath. Current antihyperlipidemic treatment includes diet change. The current treatment provides mild improvement of lipids. Risk factors for coronary artery disease include dyslipidemia, hypertension, a sedentary lifestyle and post-menopausal.      Review of Systems  Constitutional:  Positive for malaise/fatigue.  Respiratory:  Negative for cough, shortness of breath and wheezing.   All other systems reviewed and are negative.       Family History  Problem Relation Age of Onset   Cataracts Mother    Diabetes Mother    Hypertension Mother    High Cholesterol Mother    Obesity Mother    Diabetes Father    Hypertension Father    Obesity Father    Atrial fibrillation Father    Thyroid cancer Maternal Great-grandmother    Lung cancer Paternal Grandmother    Social History   Socioeconomic History   Marital status: Married    Spouse name: Johnny   Number of children: 0   Years of education: 14   Highest education level: Not on file  Occupational History   Occupation: CT Theme park manager: [employer]   Occupation: ct tech  Tobacco Use   Smoking status: Never   Smokeless tobacco: Never  Vaping Use   Vaping status: Never Used  Substance and Sexual Activity   Alcohol use: No    Alcohol/week: 0.0 standard drinks of alcohol   Drug use: No   Sexual activity: Not on file  Other Topics Concern   Not on file  Social History Narrative   Married, no children, lives at home   Caffeine use- 2-3 cups coffee 5 days a week   Social Determinants of Corporate investment banker Strain: Not on file  Food Insecurity: Not on file  Transportation Needs: Not on file  Physical Activity: Not on file  Stress: Not on file  Social Connections: Not on file    Objective:   Physical Exam Vitals reviewed.  Constitutional:      General: She is not in acute distress.    Appearance: She is well-developed. She is obese.  HENT:  Head: Normocephalic and atraumatic.     Right Ear: Tympanic membrane normal.     Left Ear: Tympanic membrane normal.  Eyes:     Pupils: Pupils are equal, round, and reactive to light.  Neck:     Thyroid: No thyromegaly.  Cardiovascular:     Rate and Rhythm: Normal rate and regular rhythm.     Heart sounds: Normal heart sounds. No murmur heard. Pulmonary:     Effort: Pulmonary effort is normal. No respiratory distress.     Breath sounds: Normal breath sounds. No wheezing.   Abdominal:     General: Bowel sounds are normal. There is no distension.     Palpations: Abdomen is soft.     Tenderness: There is no abdominal tenderness.  Musculoskeletal:        General: No tenderness. Normal range of motion.     Cervical back: Normal range of motion and neck supple.  Skin:    General: Skin is warm and dry.     Findings: Rash present.     Comments: Rosacea present on face  Neurological:     Mental Status: She is alert and oriented to person, place, and time.     Cranial Nerves: No cranial nerve deficit.     Deep Tendon Reflexes: Reflexes are normal and symmetric.  Psychiatric:        Behavior: Behavior normal.        Thought Content: Thought content normal.        Judgment: Judgment normal.       BP 109/73   Pulse 81   Temp 98 F (36.7 C) (Temporal)   Ht 5\' 6"  (1.676 m)   Wt 274 lb (124.3 kg)   SpO2 95%   BMI 44.22 kg/m      Assessment & Plan:  RAYMONDA CHELIUS comes in today with chief complaint of Medical Management of Chronic Issues   Diagnosis and orders addressed:  1. Other hyperlipidemia - CBC with Differential/Platelet - CMP14+EGFR  2. Morbid obesity (HCC) - CBC with Differential/Platelet - CMP14+EGFR  3. Intracranial hypertension - CBC with Differential/Platelet - CMP14+EGFR  4. History of pulmonary embolus (PE) - apixaban (ELIQUIS) 5 MG TABS tablet; Take 1 tablet (5 mg total) by mouth 2 (two) times daily. **NEEDS TO BE SEEN BEFORE NEXT REFILL**  Dispense: 180 tablet; Refill: 1 - CBC with Differential/Platelet - CMP14+EGFR  5. Essential hypertension - CBC with Differential/Platelet - CMP14+EGFR  6. Mild intermittent asthma without complication - CBC with Differential/Platelet - CMP14+EGFR   Labs pending Continue current medications  Health Maintenance reviewed- getting mammogram completed today Diet and exercise encouraged  Follow up plan: 6 months   Jannifer Rodney, FNP

## 2023-05-14 ENCOUNTER — Other Ambulatory Visit (HOSPITAL_COMMUNITY): Payer: Self-pay

## 2023-06-10 ENCOUNTER — Ambulatory Visit
Admission: RE | Admit: 2023-06-10 | Discharge: 2023-06-10 | Disposition: A | Discharge status: Home / Self Care | Payer: Commercial Managed Care - PPO | Source: Ambulatory Visit | Attending: Family | Admitting: Family

## 2023-06-10 DIAGNOSIS — Z1231 Encounter for screening mammogram for malignant neoplasm of breast: Secondary | ICD-10-CM

## 2023-06-11 ENCOUNTER — Other Ambulatory Visit (HOSPITAL_COMMUNITY): Payer: Self-pay

## 2023-06-23 ENCOUNTER — Other Ambulatory Visit: Payer: Self-pay | Admitting: Family

## 2023-06-23 DIAGNOSIS — J301 Allergic rhinitis due to pollen: Secondary | ICD-10-CM

## 2023-06-23 DIAGNOSIS — J4520 Mild intermittent asthma, uncomplicated: Secondary | ICD-10-CM

## 2023-06-24 ENCOUNTER — Other Ambulatory Visit: Payer: Self-pay

## 2023-06-24 ENCOUNTER — Other Ambulatory Visit (HOSPITAL_COMMUNITY): Payer: Self-pay

## 2023-06-24 MED ORDER — MONTELUKAST SODIUM 10 MG PO TABS
10.0000 mg | ORAL_TABLET | Freq: Every day | ORAL | 1 refills | Status: DC
  Filled 2023-06-24: qty 90, 90d supply, fill #0
  Filled 2023-08-22 – 2023-09-20 (×2): qty 90, 90d supply, fill #1

## 2023-07-31 ENCOUNTER — Other Ambulatory Visit (HOSPITAL_COMMUNITY): Payer: Self-pay

## 2023-07-31 ENCOUNTER — Other Ambulatory Visit (HOSPITAL_COMMUNITY): Payer: Self-pay | Admitting: Nurse Practitioner

## 2023-07-31 ENCOUNTER — Ambulatory Visit (HOSPITAL_COMMUNITY)
Admission: RE | Admit: 2023-07-31 | Discharge: 2023-07-31 | Disposition: A | Discharge status: Home / Self Care | Payer: PRIVATE HEALTH INSURANCE | Source: Ambulatory Visit | Attending: Nurse Practitioner | Admitting: Nurse Practitioner

## 2023-07-31 ENCOUNTER — Other Ambulatory Visit: Payer: Self-pay | Admitting: Cardiology

## 2023-07-31 DIAGNOSIS — S60222A Contusion of left hand, initial encounter: Secondary | ICD-10-CM | POA: Diagnosis present

## 2023-07-31 MED ORDER — POTASSIUM CHLORIDE CRYS ER 20 MEQ PO TBCR
20.0000 meq | EXTENDED_RELEASE_TABLET | Freq: Every day | ORAL | 0 refills | Status: DC
  Filled 2023-07-31: qty 90, 90d supply, fill #0

## 2023-08-06 ENCOUNTER — Other Ambulatory Visit (HOSPITAL_COMMUNITY): Payer: Self-pay

## 2023-08-22 ENCOUNTER — Other Ambulatory Visit (HOSPITAL_COMMUNITY): Payer: Self-pay

## 2023-08-22 ENCOUNTER — Other Ambulatory Visit: Payer: Self-pay

## 2023-08-23 ENCOUNTER — Other Ambulatory Visit (HOSPITAL_COMMUNITY): Payer: Self-pay

## 2023-09-06 ENCOUNTER — Other Ambulatory Visit (HOSPITAL_COMMUNITY): Payer: Self-pay

## 2023-09-20 ENCOUNTER — Other Ambulatory Visit (HOSPITAL_COMMUNITY): Payer: Self-pay

## 2023-09-20 ENCOUNTER — Other Ambulatory Visit: Payer: Self-pay | Admitting: Family

## 2023-09-20 ENCOUNTER — Encounter: Payer: Self-pay | Admitting: Family

## 2023-09-20 ENCOUNTER — Other Ambulatory Visit: Payer: Self-pay

## 2023-09-20 DIAGNOSIS — G932 Benign intracranial hypertension: Secondary | ICD-10-CM

## 2023-09-20 DIAGNOSIS — I10 Essential (primary) hypertension: Secondary | ICD-10-CM

## 2023-09-20 MED ORDER — LISINOPRIL-HYDROCHLOROTHIAZIDE 20-12.5 MG PO TABS
1.0000 | ORAL_TABLET | Freq: Every day | ORAL | 0 refills | Status: DC
  Filled 2023-09-20: qty 90, 90d supply, fill #0

## 2023-09-20 NOTE — Telephone Encounter (Signed)
NA. Letter mailed 

## 2023-09-20 NOTE — Telephone Encounter (Signed)
Christy NTBS in March for 6 mos FU RF sent to pharmacy

## 2023-10-30 ENCOUNTER — Other Ambulatory Visit: Payer: Self-pay | Admitting: Cardiology

## 2023-10-30 NOTE — Telephone Encounter (Signed)
 Hi, would Dr. Antoine Poche like to refill this medication? This patient hasn't been seen since 11/10/2020 and was given a 90 day supply on 07/31/2023.

## 2023-11-01 NOTE — Telephone Encounter (Signed)
 Patient return call. Patient states primary has been refilling medication- Potassium 20 meq. Patient would like Primary to continue to refill. She does not needed an appointment with Dr Antoine Poche .   Last office visit " as needed basis"   Patient is aware if Rx is not fill by next week to contact primary .     Patient states she is not out of medication at present time

## 2023-11-01 NOTE — Telephone Encounter (Signed)
 Left message  for patient to call back .  Patient was sent  11/10/2020  by Dr Antoine Poche. The note states patient is to return  as needed. Patient will need to contact primaryRobyne Askew FNP)  for refill of Potassium 20 meq,if fpatient would like Dr Durene Fruits to refill she will need an appointment with Dr Antoine Poche or  assistant.  Other

## 2023-11-04 ENCOUNTER — Other Ambulatory Visit: Payer: Self-pay | Admitting: Cardiology

## 2023-11-06 ENCOUNTER — Other Ambulatory Visit (HOSPITAL_COMMUNITY): Payer: Self-pay

## 2023-11-06 MED ORDER — POTASSIUM CHLORIDE CRYS ER 20 MEQ PO TBCR
20.0000 meq | EXTENDED_RELEASE_TABLET | Freq: Every day | ORAL | 0 refills | Status: DC
  Filled 2023-11-06: qty 15, 15d supply, fill #0

## 2023-11-22 ENCOUNTER — Other Ambulatory Visit: Payer: Self-pay | Admitting: Cardiology

## 2023-11-22 ENCOUNTER — Encounter: Payer: Self-pay | Admitting: Family

## 2023-11-22 ENCOUNTER — Ambulatory Visit: Payer: Commercial Managed Care - PPO | Admitting: Family

## 2023-11-22 VITALS — BP 128/72 | HR 85 | Temp 98.1°F | Ht 66.0 in | Wt 271.2 lb

## 2023-11-22 DIAGNOSIS — Z86711 Personal history of pulmonary embolism: Secondary | ICD-10-CM

## 2023-11-22 DIAGNOSIS — R400 Somnolence: Secondary | ICD-10-CM

## 2023-11-22 DIAGNOSIS — L719 Rosacea, unspecified: Secondary | ICD-10-CM

## 2023-11-22 DIAGNOSIS — Z23 Encounter for immunization: Secondary | ICD-10-CM | POA: Diagnosis not present

## 2023-11-22 DIAGNOSIS — E559 Vitamin D deficiency, unspecified: Secondary | ICD-10-CM | POA: Diagnosis not present

## 2023-11-22 DIAGNOSIS — Z0001 Encounter for general adult medical examination with abnormal findings: Secondary | ICD-10-CM | POA: Diagnosis not present

## 2023-11-22 DIAGNOSIS — Z0000 Encounter for general adult medical examination without abnormal findings: Secondary | ICD-10-CM | POA: Diagnosis not present

## 2023-11-22 DIAGNOSIS — G932 Benign intracranial hypertension: Secondary | ICD-10-CM | POA: Diagnosis not present

## 2023-11-22 DIAGNOSIS — E7849 Other hyperlipidemia: Secondary | ICD-10-CM

## 2023-11-22 DIAGNOSIS — R5383 Other fatigue: Secondary | ICD-10-CM

## 2023-11-22 DIAGNOSIS — I10 Essential (primary) hypertension: Secondary | ICD-10-CM

## 2023-11-22 DIAGNOSIS — J4520 Mild intermittent asthma, uncomplicated: Secondary | ICD-10-CM | POA: Diagnosis not present

## 2023-11-22 NOTE — Progress Notes (Signed)
 Subjective:    Patient ID: [name], female    DOB: 07/09/1971, 53 y.o.   MRN: [id number]  Chief Complaint  Patient presents with   Medical Management of Chronic Issues   Pt presents to the office today for CPE and chronic follow up. Pt has idiopathic intracranial hypertension and is followed by Dr. Marjory Mills, neurologists annually.    She has rosacea and uses metronidazole 0.75% as needed.    She has hx of PE and taking Eliquis. Has intermittent SOB and followed by Hematologist.   Complaining of fatigue and day time sleepiness.  Hypertension This is a chronic problem. The current episode started more than 1 year ago. The problem has been resolved since onset. The problem is controlled. Associated symptoms include malaise/fatigue. Pertinent negatives include no peripheral edema or shortness of breath. Risk factors for coronary artery disease include obesity. The current treatment provides moderate improvement.  Asthma She complains of cough and wheezing. There is no shortness of breath. This is a chronic problem. The current episode started more than 1 year ago. The problem occurs intermittently. Associated symptoms include malaise/fatigue. Her symptoms are aggravated by pollen. Her symptoms are alleviated by rest. She reports moderate improvement on treatment. Her past medical history is significant for asthma.  Hyperlipidemia This is a chronic problem. The current episode started more than 1 year ago. The problem is controlled. Recent lipid tests were reviewed and are normal. Exacerbating diseases include obesity. Pertinent negatives include no shortness of breath. Current antihyperlipidemic treatment includes diet change. The current treatment provides mild improvement of lipids. Risk factors for coronary artery disease include dyslipidemia, hypertension, a sedentary lifestyle and post-menopausal.      Review of Systems  Constitutional:  Positive for malaise/fatigue.   Respiratory:  Positive for cough and wheezing. Negative for shortness of breath.   All other systems reviewed and are negative.      Family History  Problem Relation Age of Onset   Cataracts Mother    Diabetes Mother    Hypertension Mother    High Cholesterol Mother    Obesity Mother    Diabetes Father    Hypertension Father    Obesity Father    Atrial fibrillation Father    Thyroid cancer Maternal Great-grandmother    Lung cancer Paternal Grandmother    Social History   Socioeconomic History   Marital status: Married    Spouse name: [name]   Number of children: 0   Years of education: 14   Highest education level: Not on file  Occupational History   Occupation: CT Theme park manager: [employer]   Occupation: ct tech  Tobacco Use   Smoking status: Never   Smokeless tobacco: Never  Vaping Use   Vaping status: Never Used  Substance and Sexual Activity   Alcohol use: No    Alcohol/week: 0.0 standard drinks of alcohol   Drug use: No   Sexual activity: Not on file  Other Topics Concern   Not on file  Social History Narrative   Married, no children, lives at home   Caffeine use- 2-3 cups coffee 5 days a week   Social Drivers of Corporate investment banker Strain: Not on file  Food Insecurity: Not on file  Transportation Needs: Not on file  Physical Activity: Not on file  Stress: Not on file  Social Connections: Not on file    Objective:   Physical Exam Vitals reviewed.  Constitutional:  General: She is not in acute distress.    Appearance: She is well-developed. She is obese.  HENT:     Head: Normocephalic and atraumatic.     Right Ear: Tympanic membrane normal.     Left Ear: Tympanic membrane normal.  Eyes:     Pupils: Pupils are equal, round, and reactive to light.  Neck:     Thyroid: No thyromegaly.  Cardiovascular:     Rate and Rhythm: Normal rate and regular rhythm.     Heart sounds: Normal heart sounds. No murmur  heard. Pulmonary:     Effort: Pulmonary effort is normal. No respiratory distress.     Breath sounds: Normal breath sounds. No wheezing.  Abdominal:     General: Bowel sounds are normal. There is no distension.     Palpations: Abdomen is soft.     Tenderness: There is no abdominal tenderness.  Musculoskeletal:        General: No tenderness. Normal range of motion.     Cervical back: Normal range of motion and neck supple.  Skin:    General: Skin is warm and dry.     Findings: Rash present.     Comments: Rosacea present on face  Neurological:     Mental Status: She is alert and oriented to person, place, and time.     Cranial Nerves: No cranial nerve deficit.     Deep Tendon Reflexes: Reflexes are normal and symmetric.  Psychiatric:        Behavior: Behavior normal.        Thought Content: Thought content normal.        Judgment: Judgment normal.       BP 128/72   Pulse 85   Temp 98.1 F (36.7 C) (Temporal)   Ht 5\' 6"  (1.676 m)   Wt 271 lb 3.2 oz (123 kg)   SpO2 98%   BMI 43.77 kg/m      Assessment & Plan:  [name] comes in today with chief complaint of Medical Management of Chronic Issues   Diagnosis and orders addressed:  1. Annual physical exam (Primary) - CMP14+EGFR - CBC with Differential/Platelet - Lipid panel - VITAMIN D 25 Hydroxy (Vit-D Deficiency, Fractures)  2. Mild intermittent asthma without complication - CMP14+EGFR - CBC with Differential/Platelet  3. Essential hypertension - CMP14+EGFR - CBC with Differential/Platelet  4. History of pulmonary embolus (PE) - CMP14+EGFR - CBC with Differential/Platelet  5. Idiopathic intracranial hypertension - CMP14+EGFR - CBC with Differential/Platelet  6. Morbid obesity (HCC) - CMP14+EGFR - CBC with Differential/Platelet - Ambulatory referral to Sleep Studies  7. Other hyperlipidemia - CMP14+EGFR - CBC with Differential/Platelet - Lipid panel  8. Vitamin D deficiency -  CMP14+EGFR - CBC with Differential/Platelet - VITAMIN D 25 Hydroxy (Vit-D Deficiency, Fractures)  9. Rosacea - CMP14+EGFR - CBC with Differential/Platelet  10. Need for shingles vaccine - Zoster Recombinant (Shingrix ) - CMP14+EGFR - CBC with Differential/Platelet  11. Need for pneumococcal vaccine - Pneumococcal conjugate vaccine 20-valent (Prevnar 20) - CMP14+EGFR - CBC with Differential/Platelet  12. Other fatigue - Ambulatory referral to Sleep Studies  13. Daytime sleepiness - Ambulatory referral to Sleep Studies  Labs pending Continue current medications  Health Maintenance reviewed Diet and exercise encouraged  Follow up plan: 6 months   Jannifer Rodney, FNP

## 2023-11-22 NOTE — Patient Instructions (Signed)

## 2023-11-23 LAB — CMP14+EGFR
ALT: 31 IU/L (ref 0–32)
AST: 19 IU/L (ref 0–40)
Albumin: 4.4 g/dL (ref 3.9–4.9)
Alkaline Phosphatase: 54 IU/L (ref 44–121)
BUN/Creatinine Ratio: 20 (ref 9–23)
BUN: 17 mg/dL (ref 6–24)
Bilirubin Total: 0.5 mg/dL (ref 0.0–1.2)
CO2: 21 mmol/L (ref 20–29)
Calcium: 10.1 mg/dL (ref 8.7–10.2)
Chloride: 105 mmol/L (ref 96–106)
Creatinine, Ser: 0.83 mg/dL (ref 0.57–1.00)
Globulin, Total: 2.4 g/dL (ref 1.5–4.5)
Glucose: 102 mg/dL — ABNORMAL HIGH (ref 70–99)
Potassium: 4 mmol/L (ref 3.5–5.2)
Sodium: 138 mmol/L (ref 134–144)
Total Protein: 6.8 g/dL (ref 6.0–8.5)
eGFR: 86 mL/min/{1.73_m2} (ref >59)

## 2023-11-23 LAB — LIPID PANEL
Chol/HDL Ratio: 3.4 ratio (ref 0.0–4.4)
Cholesterol, Total: 176 mg/dL (ref 100–199)
HDL: 52 mg/dL (ref >39)
LDL Chol Calc (NIH): 103 mg/dL — ABNORMAL HIGH (ref 0–99)
Triglycerides: 116 mg/dL (ref 0–149)
VLDL Cholesterol Cal: 21 mg/dL (ref 5–40)

## 2023-11-23 LAB — CBC WITH DIFFERENTIAL/PLATELET
Basophils Absolute: 0 10*3/uL (ref 0.0–0.2)
Basos: 1 %
EOS (ABSOLUTE): 0.3 10*3/uL (ref 0.0–0.4)
Eos: 4 %
Hematocrit: 43.1 % (ref 34.0–46.6)
Hemoglobin: 14.6 g/dL (ref 11.1–15.9)
Immature Grans (Abs): 0 10*3/uL (ref 0.0–0.1)
Immature Granulocytes: 0 %
Lymphocytes Absolute: 2.2 10*3/uL (ref 0.7–3.1)
Lymphs: 31 %
MCH: 30.9 pg (ref 26.6–33.0)
MCHC: 33.9 g/dL (ref 31.5–35.7)
MCV: 91 fL (ref 79–97)
Monocytes Absolute: 0.5 10*3/uL (ref 0.1–0.9)
Monocytes: 7 %
Neutrophils Absolute: 4.2 10*3/uL (ref 1.4–7.0)
Neutrophils: 57 %
Platelets: 242 10*3/uL (ref 150–450)
RBC: 4.73 x10E6/uL (ref 3.77–5.28)
RDW: 13 % (ref 11.7–15.4)
WBC: 7.3 10*3/uL (ref 3.4–10.8)

## 2023-11-23 LAB — VITAMIN D 25 HYDROXY (VIT D DEFICIENCY, FRACTURES): Vit D, 25-Hydroxy: 51.4 ng/mL (ref 30.0–100.0)

## 2023-11-25 ENCOUNTER — Other Ambulatory Visit: Payer: Self-pay | Admitting: Cardiology

## 2023-11-25 ENCOUNTER — Other Ambulatory Visit (HOSPITAL_COMMUNITY): Payer: Self-pay

## 2023-11-25 MED ORDER — POTASSIUM CHLORIDE CRYS ER 20 MEQ PO TBCR
20.0000 meq | EXTENDED_RELEASE_TABLET | Freq: Every day | ORAL | 0 refills | Status: DC
  Filled 2023-11-25: qty 15, 15d supply, fill #0

## 2023-11-26 ENCOUNTER — Other Ambulatory Visit (HOSPITAL_COMMUNITY): Payer: Self-pay

## 2023-12-01 ENCOUNTER — Other Ambulatory Visit: Payer: Self-pay | Admitting: Family

## 2023-12-01 DIAGNOSIS — Z86711 Personal history of pulmonary embolism: Secondary | ICD-10-CM

## 2023-12-02 ENCOUNTER — Other Ambulatory Visit (HOSPITAL_COMMUNITY): Payer: Self-pay

## 2023-12-02 MED ORDER — APIXABAN 5 MG PO TABS
5.0000 mg | ORAL_TABLET | Freq: Two times a day (BID) | ORAL | 1 refills | Status: DC
  Filled 2023-12-02: qty 180, 90d supply, fill #0
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
## 2023-12-03 NOTE — Patient Instructions (Signed)
 Below is our plan:  We will continue acetazolamide 250mg  twice daily. Please ask ophthalmology to send [name] a copy of your eye exam for next week.   Please make sure you are staying well hydrated. I recommend 50-60 ounces daily. Well balanced diet and regular exercise encouraged. Consistent sleep schedule with 6-8 hours recommended.   Please continue follow up with care team as directed.   Follow up with me in 1 year   You may receive a survey regarding today's visit. I encourage you to leave honest feed back as I do use this information to improve patient care. Thank you for seeing me today!

## 2023-12-04 ENCOUNTER — Ambulatory Visit: Payer: Commercial Managed Care - PPO | Admitting: Family Medicine

## 2023-12-04 ENCOUNTER — Other Ambulatory Visit (HOSPITAL_COMMUNITY): Payer: Self-pay

## 2023-12-04 ENCOUNTER — Encounter: Payer: Self-pay | Admitting: Family Medicine

## 2023-12-04 VITALS — BP 137/83 | HR 77

## 2023-12-04 DIAGNOSIS — G932 Benign intracranial hypertension: Secondary | ICD-10-CM

## 2023-12-04 MED ORDER — ACETAZOLAMIDE 250 MG PO TABS
250.0000 mg | ORAL_TABLET | Freq: Two times a day (BID) | ORAL | 3 refills | Status: AC
  Filled 2023-12-04 – 2024-02-16 (×2): qty 180, 90d supply, fill #0
  Filled 2024-05-17: qty 180, 90d supply, fill #1
  Filled 2024-08-14: qty 180, 90d supply, fill #2

## 2023-12-05 ENCOUNTER — Other Ambulatory Visit: Payer: Self-pay | Admitting: Cardiology

## 2023-12-07 ENCOUNTER — Other Ambulatory Visit (HOSPITAL_COMMUNITY): Payer: Self-pay

## 2023-12-09 ENCOUNTER — Other Ambulatory Visit: Payer: Self-pay | Admitting: Cardiology

## 2023-12-10 ENCOUNTER — Other Ambulatory Visit (HOSPITAL_COMMUNITY): Payer: Self-pay

## 2023-12-10 MED ORDER — POTASSIUM CHLORIDE CRYS ER 20 MEQ PO TBCR
20.0000 meq | EXTENDED_RELEASE_TABLET | Freq: Every day | ORAL | 2 refills | Status: DC
  Filled 2023-12-10: qty 9, 9d supply, fill #0

## 2023-12-11 ENCOUNTER — Other Ambulatory Visit (HOSPITAL_COMMUNITY): Payer: Self-pay

## 2023-12-11 ENCOUNTER — Other Ambulatory Visit: Payer: Self-pay

## 2023-12-11 MED ORDER — POTASSIUM CHLORIDE CRYS ER 20 MEQ PO TBCR
20.0000 meq | EXTENDED_RELEASE_TABLET | Freq: Every day | ORAL | 3 refills | Status: DC
  Filled 2023-12-12: qty 30, 30d supply, fill #0

## 2023-12-12 ENCOUNTER — Other Ambulatory Visit (HOSPITAL_COMMUNITY): Payer: Self-pay

## 2023-12-12 MED ORDER — POTASSIUM CHLORIDE CRYS ER 20 MEQ PO TBCR
20.0000 meq | EXTENDED_RELEASE_TABLET | Freq: Every day | ORAL | 1 refills | Status: DC
  Filled 2023-12-12: qty 90, 90d supply, fill #0

## 2023-12-24 ENCOUNTER — Other Ambulatory Visit (HOSPITAL_COMMUNITY): Payer: Self-pay

## 2023-12-24 ENCOUNTER — Other Ambulatory Visit: Payer: Self-pay | Admitting: Family

## 2023-12-24 DIAGNOSIS — J301 Allergic rhinitis due to pollen: Secondary | ICD-10-CM

## 2023-12-24 DIAGNOSIS — G932 Benign intracranial hypertension: Secondary | ICD-10-CM

## 2023-12-24 DIAGNOSIS — J4520 Mild intermittent asthma, uncomplicated: Secondary | ICD-10-CM

## 2023-12-24 DIAGNOSIS — I10 Essential (primary) hypertension: Secondary | ICD-10-CM

## 2023-12-24 MED ORDER — MONTELUKAST SODIUM 10 MG PO TABS
10.0000 mg | ORAL_TABLET | Freq: Every day | ORAL | 1 refills | Status: DC
  Filled 2023-12-24: qty 90, 90d supply, fill #0
  Filled 2024-03-22: qty 90, 90d supply, fill #1

## 2023-12-24 MED ORDER — LISINOPRIL-HYDROCHLOROTHIAZIDE 20-12.5 MG PO TABS
1.0000 | ORAL_TABLET | Freq: Every day | ORAL | 1 refills | Status: DC
  Filled 2023-12-24: qty 90, 90d supply, fill #0
  Filled 2024-03-22: qty 90, 90d supply, fill #1

## 2023-12-25 ENCOUNTER — Ambulatory Visit: Admitting: Neurology

## 2023-12-25 ENCOUNTER — Encounter: Payer: Self-pay | Admitting: Neurology

## 2023-12-25 VITALS — BP 131/82 | HR 82 | Ht 66.0 in | Wt 277.0 lb

## 2023-12-25 DIAGNOSIS — R351 Nocturia: Secondary | ICD-10-CM | POA: Diagnosis not present

## 2023-12-25 DIAGNOSIS — R0683 Snoring: Secondary | ICD-10-CM | POA: Diagnosis not present

## 2023-12-25 DIAGNOSIS — R635 Abnormal weight gain: Secondary | ICD-10-CM | POA: Diagnosis not present

## 2023-12-25 DIAGNOSIS — Z9189 Other specified personal risk factors, not elsewhere classified: Secondary | ICD-10-CM
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
## 2023-12-25 NOTE — Patient Instructions (Addendum)
 It was nice to see you again today.  Here is what we discussed today:    Based on your symptoms and your exam I believe you are at risk for obstructive sleep apnea (aka OSA). We should proceed with a sleep study to determine whether you do or do not have OSA and how severe it is. Even, if you have mild OSA, I may want you to consider treatment with CPAP, as treatment of even borderline or mild sleep apnea can result and improvement of symptoms such as sleep disruption, daytime sleepiness, nighttime bathroom breaks, restless leg symptoms, improvement of headache syndromes, even improved mood disorder.   As explained, an attended sleep study (meaning you get to stay overnight in the sleep lab), lets us  monitor sleep-related behaviors such as sleep talking and leg movements in sleep, in addition to monitoring for sleep apnea.  A home sleep test is a screening tool for sleep apnea diagnosis only, but unfortunately, does not help with any other sleep-related diagnoses.  Please remember, the long-term risks and ramifications of untreated moderate to severe obstructive sleep apnea may include (but are not limited to): increased risk for cardiovascular disease, including congestive heart failure, stroke, difficult to control hypertension, treatment resistant obesity, arrhythmias, especially irregular heartbeat commonly known as A. Fib. (atrial fibrillation); even type 2 diabetes has been linked to untreated OSA.   Other correlations that untreated obstructive sleep apnea include macular edema which is swelling of the retina in the eyes, droopy eyelid syndrome, and elevated hemoglobin and hematocrit levels (often referred to as polycythemia).  Sleep apnea can cause disruption of sleep and sleep deprivation in most cases, which, in turn, can cause recurrent headaches, problems with memory, mood, concentration, focus, and vigilance. Most people with untreated sleep apnea report excessive daytime sleepiness, which can  affect their ability to drive. Please do not drive or use heavy equipment or machinery, if you feel sleepy! Patients with sleep apnea can also develop difficulty initiating and maintaining sleep (aka insomnia).   Having sleep apnea may increase your risk for other sleep disorders, including involuntary behaviors sleep such as sleep terrors, sleep talking, sleepwalking.    Having sleep apnea can also increase your risk for restless leg syndrome and leg movements at night.   Please note that untreated obstructive sleep apnea may carry additional perioperative morbidity. Patients with significant obstructive sleep apnea (typically, in the moderate to severe degree) should receive, if possible, perioperative PAP (positive airway pressure) therapy and the surgeons and particularly the anesthesiologists should be informed of the diagnosis and the severity of the sleep disordered breathing.   We will call you or email you through MyChart with regards to your test results and plan a follow-up in sleep clinic accordingly. Most likely, you will hear from one of our nurses.   Our sleep lab administrative assistant will call you to schedule your sleep study and give you further instructions, regarding the check in process for the sleep study, arrival time, what to bring, when you can expect to leave after the study, etc., and to answer any other logistical questions you may have. If you don't hear back from her by about 2 weeks from now, please feel free to call her direct line at [phone] or you can call our general clinic number, or email us  through My Chart.

## 2023-12-31 ENCOUNTER — Other Ambulatory Visit: Payer: Self-pay | Admitting: Family

## 2023-12-31 ENCOUNTER — Other Ambulatory Visit (HOSPITAL_COMMUNITY): Payer: Self-pay

## 2023-12-31 DIAGNOSIS — Z86711 Personal history of pulmonary embolism: Secondary | ICD-10-CM

## 2023-12-31 MED ORDER — POTASSIUM CHLORIDE CRYS ER 20 MEQ PO TBCR
20.0000 meq | EXTENDED_RELEASE_TABLET | Freq: Every day | ORAL | 1 refills | Status: DC
  Filled 2023-12-31 – 2024-01-05 (×2): qty 90, 90d supply, fill #0
  Filled 2024-04-05: qty 90, 90d supply, fill #1

## 2023-12-31 MED ORDER — APIXABAN 5 MG PO TABS
5.0000 mg | ORAL_TABLET | Freq: Two times a day (BID) | ORAL | 1 refills | Status: DC
  Filled 2023-12-31 – 2024-03-03 (×2): qty 180, 90d supply, fill #0

## 2024-01-01 ENCOUNTER — Other Ambulatory Visit (HOSPITAL_COMMUNITY): Payer: Self-pay

## 2024-01-03 ENCOUNTER — Ambulatory Visit: Admitting: Neurology

## 2024-01-03 DIAGNOSIS — G4733 Obstructive sleep apnea (adult) (pediatric): Secondary | ICD-10-CM

## 2024-01-03 DIAGNOSIS — R635 Abnormal weight gain: Secondary | ICD-10-CM

## 2024-01-03 DIAGNOSIS — Z9189 Other specified personal risk factors, not elsewhere classified: Secondary | ICD-10-CM

## 2024-01-03 DIAGNOSIS — R0683 Snoring: Secondary | ICD-10-CM

## 2024-01-03 DIAGNOSIS — R351 Nocturia: Secondary | ICD-10-CM

## 2024-01-05 ENCOUNTER — Other Ambulatory Visit: Payer: Self-pay

## 2024-01-17 NOTE — Progress Notes (Signed)
 See procedure note.

## 2024-01-20 ENCOUNTER — Ambulatory Visit: Payer: Self-pay | Admitting: Neurology

## 2024-01-20 NOTE — Procedures (Signed)
 [facility]  HOME SLEEP TEST (SANSA) REPORT (Mail-Out Device):   STUDY DATE: 01/05/2024  DOB: [birth year]  MRN: [id number]  ORDERING CLINICIAN: [name], MD, PhD   REFERRING CLINICIAN: Yevette Hem, FNP   CLINICAL INFORMATION/HISTORY: 53 year old female with an underlying medical history of hypertension, history of DVT and saddle PE, Chiari I malformation with status post decompression surgery, idiopathic intracranial hypertension, hyperlipidemia, vitamin D  deficiency, rosacea, history of kidney stone, lactose intolerance, subclavian bypass graft, and severe obesity with a BMI of over 40, who reports snoring and excessive daytime somnolence.    PATIENT'S LAST REPORTED EPWORTH SLEEPINESS SCORE (ESS): 6/24.  BMI (at the time of sleep clinic visit and/or test date): 44.7 kg/m  FINDINGS:   Study Protocol:    The SANSA single-point-of-skin-contact chest-worn sensor - an FDA cleared and DOT approved type 4 home sleep test device - measures eight physiological channels,  including blood oxygen saturation (measured via PPG [photoplethysmography]), EKG-derived heart rate, respiratory effort, chest movement (measured via accelerometer), snoring, body position, and actigraphy. The device is designed to be worn for up to 10 hours per study.   Sleep Summary:   Total Recording Time (hours, min): 9 hours, 22 min  Total Effective Sleep Time (hours, min):  7 hours, 42 min  Sleep Efficiency (%):    94%   Respiratory Indices:   Calculated sAHI (per hour):  6.2/hour         Oxygen Saturation Statistics:    Oxygen Saturation (%) Mean: 97%   Minimum oxygen saturation (%):                 83.1%   O2 Saturation Range (%): 83.1-99.7%   Time below or at 88% saturation: 1 min   Pulse Rate Statistics:   Pulse Mean (bpm):    75/min    Pulse Range (60 - 106/min)   Snoring: Mild to moderate, intermittent  IMPRESSION/DIAGNOSES:   OSA (obstructive sleep apnea),  mild    RECOMMENDATIONS:   This home sleep test demonstrates overall mild obstructive sleep apnea with a total AHI of 6.2/hour and O2 nadir of 83.1%. Snoring was detected, intermittently in the mild to moderate range. Given the patient's medical history and sleep related complaints, therapy with a positive airway pressure device is a reasonable choice and will be offered to the patient.  Treatment can be achieved in the form of autoPAP trial/titration at home for now. A full night, in-lab PAP titration study may aid in improving proper treatment settings and with mask fit, if needed, down the road. Alternative treatments may include weight loss (where appropriate) along with avoidance of the supine sleep position (if possible), or an oral appliance in appropriate candidates.   Please note that untreated obstructive sleep apnea may carry additional perioperative morbidity. Patients with significant obstructive sleep apnea should receive perioperative PAP therapy and the surgeons and particularly the anesthesiologist should be informed of the diagnosis and the severity of the sleep disordered breathing. The patient should be cautioned not to drive, work at heights, or operate dangerous or heavy equipment when tired or sleepy. Review and reiteration of good sleep hygiene measures should be pursued with any patient. Other causes of the patient's symptoms, including circadian rhythm disturbances, an underlying mood disorder, medication effect and/or an underlying medical problem cannot be ruled out based on this test. Clinical correlation is recommended.  The patient and her referring provider will be notified of the test results. The patient will be seen in follow  up in sleep clinic at [facility], as necessary.  I certify that I have reviewed the raw data recording prior to the issuance of this report in accordance with the standards of the American Academy of Sleep Medicine (AASM).    INTERPRETING PHYSICIAN:    [name], MD, PhD Medical Director, [facility]) Diplomat, ABPN (Neurology and Sleep)   [facility] [address], [address] [place], [place] [address] [phone]

## 2024-01-21 ENCOUNTER — Telehealth: Payer: Self-pay | Admitting: Hematology and Oncology

## 2024-01-22 ENCOUNTER — Inpatient Hospital Stay: Payer: Commercial Managed Care - PPO

## 2024-01-22 ENCOUNTER — Ambulatory Visit: Payer: Commercial Managed Care - PPO | Admitting: Hematology and Oncology

## 2024-01-22 DIAGNOSIS — G4733 Obstructive sleep apnea (adult) (pediatric): Secondary | ICD-10-CM

## 2024-01-23 NOTE — Telephone Encounter (Signed)
 Spoke with patient and discussed her sleep study results as noted below by Dr Omar Bibber. For now, the patient wants to try weight loss and avoiding sleeping in the supine position. If ineffective in treatment her apnea/symptoms, she will call back for autopap referral. Patient's questions were answered and she thanked me for the call.   Sleep study report sent to referring provider, Tommas Fragmin FNP.

## 2024-01-24 ENCOUNTER — Other Ambulatory Visit (HOSPITAL_COMMUNITY): Payer: Self-pay

## 2024-01-24 ENCOUNTER — Encounter (HOSPITAL_COMMUNITY): Payer: Self-pay

## 2024-01-24 MED ORDER — TIRZEPATIDE-WEIGHT MANAGEMENT 2.5 MG/0.5ML ~~LOC~~ SOLN
2.5000 mg | SUBCUTANEOUS | 2 refills | Status: DC
Start: 2024-01-24 — End: 2024-01-30
  Filled 2024-01-24 – 2024-01-29 (×2): qty 0.5, 7d supply, fill #0

## 2024-01-24 NOTE — Telephone Encounter (Signed)
 Copied from CRM [id number]. Topic: Clinical - Prescription Issue >> Jan 24, 2024  4:15 PM Hamp Levine R wrote: Reason for CRM: Pattie Borders from [facility] pharmacy calling regards to a prescription called in today for zepbound 2.5 vials. States they do not have the vials in their market and only have pens. States need the prescription update to fill.   Pattie Borders can be reached at [phone]

## 2024-01-25 ENCOUNTER — Other Ambulatory Visit (HOSPITAL_COMMUNITY): Payer: Self-pay

## 2024-01-29 ENCOUNTER — Other Ambulatory Visit (HOSPITAL_COMMUNITY): Payer: Self-pay

## 2024-01-29 ENCOUNTER — Other Ambulatory Visit: Payer: Self-pay | Admitting: Family

## 2024-01-29 DIAGNOSIS — G4733 Obstructive sleep apnea (adult) (pediatric): Secondary | ICD-10-CM

## 2024-01-30 ENCOUNTER — Encounter (HOSPITAL_COMMUNITY): Payer: Self-pay

## 2024-01-30 ENCOUNTER — Telehealth: Payer: Self-pay

## 2024-01-30 ENCOUNTER — Other Ambulatory Visit (HOSPITAL_COMMUNITY): Payer: Self-pay

## 2024-01-30 MED ORDER — ZEPBOUND 2.5 MG/0.5ML ~~LOC~~ SOAJ
2.5000 mg | SUBCUTANEOUS | 1 refills | Status: AC
  Filled 2024-01-30: qty 4, 56d supply, fill #0

## 2024-01-30 NOTE — Telephone Encounter (Signed)
 Copied from CRM [id number]. Topic: Clinical - Medication Question >> Jan 30, 2024  9:19 AM Loreda Rodriguez T wrote: Reason for CRM: Pattie Borders from [facility] pharmacy calling back again regarding a prescription refill request for zepbound 2.5 vials. States they do not have the vials in their market and only have pens. States need the prescription update to refill with the pen. Pattie Borders says they sent the original script 6 days ago and need this expedited

## 2024-01-30 NOTE — Telephone Encounter (Signed)
 New Prescription sent to pharmacy

## 2024-02-03 ENCOUNTER — Other Ambulatory Visit (HOSPITAL_COMMUNITY): Payer: Self-pay

## 2024-02-05 ENCOUNTER — Other Ambulatory Visit (HOSPITAL_COMMUNITY): Payer: Self-pay

## 2024-02-13 ENCOUNTER — Other Ambulatory Visit (HOSPITAL_COMMUNITY): Payer: Self-pay

## 2024-02-15 ENCOUNTER — Other Ambulatory Visit (HOSPITAL_COMMUNITY): Payer: Self-pay

## 2024-02-17 ENCOUNTER — Other Ambulatory Visit (HOSPITAL_COMMUNITY): Payer: Self-pay

## 2024-03-03 ENCOUNTER — Other Ambulatory Visit (HOSPITAL_COMMUNITY): Payer: Self-pay

## 2024-05-25 ENCOUNTER — Other Ambulatory Visit (HOSPITAL_COMMUNITY): Payer: Self-pay

## 2024-05-25 ENCOUNTER — Encounter: Payer: Self-pay | Admitting: Family

## 2024-05-25 ENCOUNTER — Ambulatory Visit: Admitting: Family

## 2024-05-25 ENCOUNTER — Other Ambulatory Visit: Payer: Self-pay

## 2024-05-25 VITALS — BP 122/78 | HR 85 | Temp 97.8°F | Ht 66.0 in | Wt 279.0 lb

## 2024-05-25 DIAGNOSIS — E559 Vitamin D deficiency, unspecified: Secondary | ICD-10-CM

## 2024-05-25 DIAGNOSIS — L719 Rosacea, unspecified: Secondary | ICD-10-CM | POA: Diagnosis not present

## 2024-05-25 DIAGNOSIS — G932 Benign intracranial hypertension: Secondary | ICD-10-CM

## 2024-05-25 DIAGNOSIS — I10 Essential (primary) hypertension: Secondary | ICD-10-CM | POA: Diagnosis not present

## 2024-05-25 DIAGNOSIS — Z1159 Encounter for screening for other viral diseases: Secondary | ICD-10-CM | POA: Diagnosis not present

## 2024-05-25 DIAGNOSIS — Z86711 Personal history of pulmonary embolism: Secondary | ICD-10-CM | POA: Diagnosis not present

## 2024-05-25 DIAGNOSIS — J301 Allergic rhinitis due to pollen: Secondary | ICD-10-CM | POA: Diagnosis not present

## 2024-05-25 DIAGNOSIS — E7849 Other hyperlipidemia: Secondary | ICD-10-CM | POA: Diagnosis not present

## 2024-05-25 DIAGNOSIS — J4520 Mild intermittent asthma, uncomplicated: Secondary | ICD-10-CM | POA: Diagnosis not present

## 2024-05-25 DIAGNOSIS — Z23 Encounter for immunization: Secondary | ICD-10-CM | POA: Diagnosis not present

## 2024-05-25 MED ORDER — MONTELUKAST SODIUM 10 MG PO TABS
10.0000 mg | ORAL_TABLET | Freq: Every day | ORAL | 1 refills | Status: AC
  Filled 2024-05-25 – 2024-06-18 (×2): qty 90, 90d supply, fill #0
  Filled 2024-09-13: qty 90, 90d supply, fill #1

## 2024-05-25 MED ORDER — POTASSIUM CHLORIDE CRYS ER 20 MEQ PO TBCR
20.0000 meq | EXTENDED_RELEASE_TABLET | Freq: Every day | ORAL | 1 refills | Status: AC
  Filled 2024-05-25 – 2024-07-05 (×2): qty 90, 90d supply, fill #0
  Filled 2024-10-01: qty 90, 90d supply, fill #1

## 2024-05-25 MED ORDER — ALBUTEROL SULFATE HFA 108 (90 BASE) MCG/ACT IN AERS
2.0000 | INHALATION_SPRAY | RESPIRATORY_TRACT | 2 refills | Status: AC | PRN
  Filled 2024-05-25: qty 6.7, 17d supply, fill #0
  Filled 2024-09-13: qty 6.7, 17d supply, fill #1

## 2024-05-25 MED ORDER — APIXABAN 5 MG PO TABS
5.0000 mg | ORAL_TABLET | Freq: Two times a day (BID) | ORAL | 1 refills | Status: AC
  Filled 2024-05-25: qty 180, 90d supply, fill #0
  Filled 2024-08-28: qty 180, 90d supply, fill #1
  Filled 2024-09-01: qty 180, 90d supply, fill #0

## 2024-05-25 MED ORDER — LISINOPRIL-HYDROCHLOROTHIAZIDE 20-12.5 MG PO TABS
1.0000 | ORAL_TABLET | Freq: Every day | ORAL | 1 refills | Status: AC
  Filled 2024-05-25 – 2024-06-18 (×2): qty 90, 90d supply, fill #0
  Filled 2024-09-13: qty 90, 90d supply, fill #1

## 2024-05-25 NOTE — Patient Instructions (Signed)

## 2024-05-25 NOTE — Progress Notes (Signed)
 Subjective:    Patient ID: [name], female    DOB: 19-Mar-1971, 53 y.o.   MRN: [id number]  Chief Complaint  Patient presents with   Medical Management of Chronic Issues   Pt presents to the office today for  chronic follow up.   Pt has idiopathic intracranial hypertension and is followed by Dr. Margaret, neurologists annually.    She has rosacea and uses metronidazole  0.75% as needed.    She has hx of PE and taking Eliquis . Has intermittent SOB and followed by Hematologist.  Hypertension This is a chronic problem. The current episode started more than 1 year ago. The problem has been resolved since onset. The problem is controlled. Pertinent negatives include no malaise/fatigue, peripheral edema or shortness of breath. Risk factors for coronary artery disease include obesity. The current treatment provides moderate improvement.  Asthma There is no cough, shortness of breath or wheezing. This is a chronic problem. The current episode started more than 1 year ago. The problem occurs intermittently. Pertinent negatives include no malaise/fatigue. Her symptoms are aggravated by pollen. Her symptoms are alleviated by rest. She reports moderate improvement on treatment. Her past medical history is significant for asthma.  Hyperlipidemia This is a chronic problem. The current episode started more than 1 year ago. The problem is uncontrolled. Recent lipid tests were reviewed and are high. Exacerbating diseases include obesity. Pertinent negatives include no shortness of breath. Current antihyperlipidemic treatment includes diet change. The current treatment provides no improvement of lipids. Risk factors for coronary artery disease include dyslipidemia, hypertension, a sedentary lifestyle, post-menopausal and obesity.      Review of Systems  Constitutional:  Negative for malaise/fatigue.  Respiratory:  Negative for cough, shortness of breath and wheezing.   All other systems reviewed  and are negative.      Family History  Problem Relation Age of Onset   Cataracts Mother    Diabetes Mother    Hypertension Mother    High Cholesterol Mother    Obesity Mother    Diabetes Father    Hypertension Father    Obesity Father    Atrial fibrillation Father    Sleep apnea Father    Lung cancer Paternal Grandmother    Thyroid  cancer Maternal Great-grandmother    Social History   Socioeconomic History   Marital status: Married    Spouse name: Johnny   Number of children: 0   Years of education: 14   Highest education level: Tax adviser degree: occupational, Scientist, product/process development, or vocational program  Occupational History   Occupation: [employer] Theme park manager: [employer]   Occupation: ct tech  Tobacco Use   Smoking status: Never   Smokeless tobacco: Never  Vaping Use   Vaping status: Never Used  Substance and Sexual Activity   Alcohol use: No    Alcohol/week: 0.0 standard drinks of alcohol   Drug use: No   Sexual activity: Not on file  Other Topics Concern   Not on file  Social History Narrative   Married, no children, lives at home   Caffeine  use- 2-3 cups coffee 5 days a week   Pt works    Teacher, early years/pre Strain: Low Risk  (05/24/2024)   Overall Financial Resource Strain (CARDIA)    Difficulty of Paying Living Expenses: Not hard at all  Food Insecurity: No Food Insecurity (05/24/2024)   Hunger Vital Sign    Worried About Running Out of Food in the  Last Year: Never true    Ran Out of Food in the Last Year: Never true  Transportation Needs: No Transportation Needs (05/24/2024)   PRAPARE - Administrator, Civil Service (Medical): No    Lack of Transportation (Non-Medical): No  Physical Activity: Insufficiently Active (05/24/2024)   Exercise Vital Sign    Days of Exercise per Week: 2 days    Minutes of Exercise per Session: 30 min  Stress: No Stress Concern Present (05/24/2024)   [employer] of Occupational Health  - Occupational Stress Questionnaire    Feeling of Stress: Only a little  Social Connections: Moderately Isolated (05/24/2024)   Social Connection and Isolation Panel    Frequency of Communication with Friends and Family: More than three times a week    Frequency of Social Gatherings with Friends and Family: Once a week    Attends Religious Services: Never    Database administrator or Organizations: No    Attends Engineer, structural: Not on file    Marital Status: Married    Objective:   Physical Exam Vitals reviewed.  Constitutional:      General: She is not in acute distress.    Appearance: She is well-developed. She is obese.  HENT:     Head: Normocephalic and atraumatic.     Right Ear: Tympanic membrane normal.     Left Ear: Tympanic membrane normal.  Eyes:     Pupils: Pupils are equal, round, and reactive to light.  Neck:     Thyroid : No thyromegaly.  Cardiovascular:     Rate and Rhythm: Normal rate and regular rhythm.     Heart sounds: Normal heart sounds. No murmur heard. Pulmonary:     Effort: Pulmonary effort is normal. No respiratory distress.     Breath sounds: Normal breath sounds. No wheezing.  Abdominal:     General: Bowel sounds are normal. There is no distension.     Palpations: Abdomen is soft.     Tenderness: There is no abdominal tenderness.  Musculoskeletal:        General: No tenderness. Normal range of motion.     Cervical back: Normal range of motion and neck supple.  Skin:    General: Skin is warm and dry.     Findings: Rash present.     Comments: Rosacea present on face  Neurological:     Mental Status: She is alert and oriented to person, place, and time.     Cranial Nerves: No cranial nerve deficit.     Deep Tendon Reflexes: Reflexes are normal and symmetric.  Psychiatric:        Behavior: Behavior normal.        Thought Content: Thought content normal.        Judgment: Judgment normal.       BP 122/78   Pulse 85   Temp 97.8  F (36.6 C) (Temporal)   Ht 5' 6 (1.676 m)   Wt 279 lb (126.6 kg)   SpO2 96%   BMI 45.03 kg/m      Assessment & Plan:  [name] comes in today with chief complaint of Medical Management of Chronic Issues   Diagnosis and orders addressed:  1. Allergic rhinitis due to pollen, unspecified seasonality - albuterol  (VENTOLIN  HFA) 108 (90 Base) MCG/ACT inhaler; Inhale 2 puffs into the lungs every 4 hours as needed for wheezing or shortness of breath.  Dispense: 18 g; Refill: 2 - montelukast  (SINGULAIR ) 10 MG  tablet; Take 1 tablet (10 mg total) by mouth at bedtime.  Dispense: 90 tablet; Refill: 1 - CMP14+EGFR  2. History of pulmonary embolus (PE) - apixaban  (ELIQUIS ) 5 MG TABS tablet; Take 1 tablet (5 mg total) by mouth 2 (two) times daily.  Dispense: 180 tablet; Refill: 1 - CMP14+EGFR  3. Idiopathic intracranial hypertension - lisinopril -hydrochlorothiazide  (ZESTORETIC ) 20-12.5 MG tablet; Take 1 tablet by mouth daily.  Dispense: 90 tablet; Refill: 1 - CMP14+EGFR  4. Essential hypertension (Primary) - lisinopril -hydrochlorothiazide  (ZESTORETIC ) 20-12.5 MG tablet; Take 1 tablet by mouth daily.  Dispense: 90 tablet; Refill: 1 - CMP14+EGFR  5. Mild intermittent asthma without complication - montelukast  (SINGULAIR ) 10 MG tablet; Take 1 tablet (10 mg total) by mouth at bedtime.  Dispense: 90 tablet; Refill: 1 - CMP14+EGFR  6. Other hyperlipidemia - CMP14+EGFR  7. Rosacea - CMP14+EGFR  8. Morbid obesity (HCC) - CMP14+EGFR  9. Vitamin D  deficiency - CMP14+EGFR  10. Need for hepatitis B screening test - Hepatitis B surface antibody,quantitative   Labs pending Continue current medications  Health Maintenance reviewed Diet and exercise encouraged  Follow up plan: 6 months   Bari Learn, FNP

## 2024-05-26 ENCOUNTER — Ambulatory Visit: Payer: Self-pay | Admitting: Family

## 2024-05-26 LAB — CMP14+EGFR
ALT: 34 IU/L — AB (ref 0–32)
AST: 24 IU/L (ref 0–40)
Albumin: 3.7 g/dL — ABNORMAL LOW (ref 3.8–4.9)
Alkaline Phosphatase: 50 IU/L (ref 49–135)
BUN/Creatinine Ratio: 20 (ref 9–23)
BUN: 16 mg/dL (ref 6–24)
Bilirubin Total: 0.2 mg/dL (ref 0.0–1.2)
CO2: 17 mmol/L — AB (ref 20–29)
Calcium: 9.1 mg/dL (ref 8.7–10.2)
Chloride: 108 mmol/L — ABNORMAL HIGH (ref 96–106)
Creatinine, Ser: 0.81 mg/dL (ref 0.57–1.00)
Globulin, Total: 2.6 g/dL (ref 1.5–4.5)
Glucose: 115 mg/dL — AB (ref 70–99)
Potassium: 4.1 mmol/L (ref 3.5–5.2)
Sodium: 140 mmol/L (ref 134–144)
Total Protein: 6.3 g/dL (ref 6.0–8.5)
eGFR: 88 mL/min/1.73 (ref >59)

## 2024-05-26 LAB — HEPATITIS B SURFACE ANTIBODY, QUANTITATIVE: Hepatitis B Surf Ab Quant: 150 m[IU]/mL

## 2024-05-28 ENCOUNTER — Other Ambulatory Visit: Payer: Self-pay | Admitting: Family Medicine

## 2024-05-28 DIAGNOSIS — R739 Hyperglycemia, unspecified: Secondary | ICD-10-CM

## 2024-05-29 ENCOUNTER — Other Ambulatory Visit

## 2024-05-29 ENCOUNTER — Ambulatory Visit: Payer: Self-pay | Admitting: Family

## 2024-05-29 DIAGNOSIS — R739 Hyperglycemia, unspecified: Secondary | ICD-10-CM | POA: Diagnosis not present

## 2024-05-29 LAB — BAYER DCA HB A1C WAIVED: HB A1C (BAYER DCA - WAIVED): 5.2 % (ref 4.8–5.6)

## 2024-06-18 ENCOUNTER — Other Ambulatory Visit (HOSPITAL_COMMUNITY): Payer: Self-pay

## 2024-06-18 ENCOUNTER — Other Ambulatory Visit: Payer: Self-pay

## 2024-06-22 ENCOUNTER — Other Ambulatory Visit: Payer: Self-pay | Admitting: Family

## 2024-06-22 DIAGNOSIS — Z1231 Encounter for screening mammogram for malignant neoplasm of breast: Secondary | ICD-10-CM

## 2024-06-24 ENCOUNTER — Ambulatory Visit
Admission: RE | Admit: 2024-06-24 | Discharge: 2024-06-24 | Disposition: A | Source: Ambulatory Visit | Attending: Family | Admitting: Family

## 2024-06-24 DIAGNOSIS — Z1231 Encounter for screening mammogram for malignant neoplasm of breast: Secondary | ICD-10-CM

## 2024-07-05 ENCOUNTER — Other Ambulatory Visit (HOSPITAL_COMMUNITY): Payer: Self-pay

## 2024-08-19 ENCOUNTER — Other Ambulatory Visit (HOSPITAL_COMMUNITY): Payer: Self-pay

## 2024-08-31 ENCOUNTER — Encounter: Payer: Self-pay | Admitting: Neurology

## 2024-09-01 ENCOUNTER — Other Ambulatory Visit: Payer: Self-pay

## 2024-09-01 ENCOUNTER — Other Ambulatory Visit (HOSPITAL_COMMUNITY): Payer: Self-pay

## 2024-09-04 ENCOUNTER — Other Ambulatory Visit: Payer: Self-pay

## 2024-11-23 ENCOUNTER — Encounter: Payer: Self-pay | Admitting: Family

## 2024-12-09 ENCOUNTER — Ambulatory Visit: Admitting: Family Medicine

## 2025-06-21 ENCOUNTER — Ambulatory Visit: Admitting: Family Medicine
